# Patient Record
Sex: Female | Born: 1990
Health system: Southern US, Community
[De-identification: ages and names within clinical notes are randomized; demographics above are authoritative.]

## PROBLEM LIST (undated history)

## (undated) DIAGNOSIS — R519 Headache, unspecified: Secondary | ICD-10-CM

## (undated) DIAGNOSIS — R55 Syncope and collapse: Secondary | ICD-10-CM

## (undated) DIAGNOSIS — R51 Headache: Secondary | ICD-10-CM

## (undated) DIAGNOSIS — R002 Palpitations: Secondary | ICD-10-CM

## (undated) DIAGNOSIS — I498 Other specified cardiac arrhythmias: Secondary | ICD-10-CM

## (undated) DIAGNOSIS — I456 Pre-excitation syndrome: Secondary | ICD-10-CM

## (undated) DIAGNOSIS — R000 Tachycardia, unspecified: Secondary | ICD-10-CM

## (undated) DIAGNOSIS — G90A Postural orthostatic tachycardia syndrome (POTS): Secondary | ICD-10-CM

## (undated) DIAGNOSIS — I951 Orthostatic hypotension: Secondary | ICD-10-CM

---
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
## 2010-05-04 NOTE — Assessment & Plan Note (Signed)
Summary: eph/chest pains-mb   Visit Type:  Post-hospital  CC:  Chest pains.  History of Present Illness: Tis an 20 year old, African American female, patient, who presented to the emergency room July 28, 2009 with chest pain. The first day happened. She was getting dressed for school. She describes it as a sharp, shooting pain, followed by a heavy pressure. It will last a few minutes go away, but then come back. This is gone off-and-on for approximately 4 weeks now. It has improved slightly. He does not get worse with exercise. Nothing makes it better. She does have occasional dizziness with it. She plays basketball during the winter months, but denies any symptoms with exercise. She denies syncope, dyspnea, dyspnea on exertion. She denies palpitations.  The patient has been under quite a bit of stress with school and finishing her senior year at [employer].  Her EKG in the emergency room revealed Wolff-Parkinson-White syndrome with a short PR interval and delta wave and she was referred here.  Preventive Screening-Counseling & Management  Alcohol-Tobacco     Smoking Status: never  Caffeine-Diet-Exercise     Does Patient Exercise: yes      Drug Use:  no.    Current Medications (verified): 1)  Patient Does Not Take Any Meds  Allergies (verified): No Known Drug Allergies  Past History:  Past Medical History: Last updated: 08/19/2009 Current Problems:  CHEST PAIN (ICD-786.50) CONSTIPATION (ICD-564.00)  Family History: Family History of Hypertension:  FHx negative for CAD  Social History: Student  Tobacco Use - No.  Alcohol Use - no Regular Exercise - yes Drug Use - no Smoking Status:  never Does Patient Exercise:  yes Drug Use:  no  Review of Systems       see history of present illness. Otherwise, review of systems negative  Vital Signs:  Patient profile:   20 year old female Height:      63 inches Weight:      138.25 pounds BMI:     24.58 Pulse  rate:   72 / minute Pulse rhythm:   regular Resp:     18 per minute BP sitting:   104 / 80  (left arm) Cuff size:   regular  Vitals Entered By: Vikki Ports (Aug 20, 2009 10:14 AM)  Physical Exam  General:   Well-nournished, in no acute distress. Neck: No JVD, HJR, Bruit, or thyroid enlargement Lungs: No tachypnea, clear without wheezing, rales, or rhonchi Cardiovascular: RRR, PMI not displaced, heart sounds normal, no murmurs, gallops, bruit, thrill, or heave. Abdomen: BS normal. Soft without organomegaly, masses, lesions or tenderness. Extremities: without cyanosis, clubbing or edema. Good distal pulses bilateral SKin: Warm, no lesions or rashes  Musculoskeletal: No deformities Neuro: no focal signs    EKG  Procedure date:  08/20/2009  Findings:      normal sinus rhythm with short PR interval and small delta wave consistent with WPW  Impression & Recommendations:  Problem # 1:  WOLFF (WOLFE)-PARKINSON-WHITE (WPW) SYNDROME (ICD-426.7) EKG shows short PR interval and delta wave. This was reviewed with Dr. Sharrell Ku, who recommends 2-D echo and exercise treadmill testing. The patient denies palpitations, or syncope. She had occasional dizziness.  Problem # 2:  CHEST PAIN (ICD-786.50) chest pain sounds musculoskeletal in origin. Patient's father thinks it is stress related. We'll check an exercise treadmill. Orders: Echocardiogram (Echo) Treadmill (Treadmill)  Patient Instructions: 1)  Your physician recommends that you schedule a follow-up appointment in: 1 month with Dr. Ladona Ridgel. 2)  Your physician has requested that you have an exercise tolerance test.  For further information please visit [email or web address]  Please also follow instruction sheet, as given. 3)  Your physician has requested that you have an echocardiogram.  Echocardiography is a painless test that uses sound waves to create images of your heart. It provides your doctor with information about the size and  shape of your heart and how well your heart's chambers and valves are working.  This procedure takes approximately one hour. There are no restrictions for this procedure.

## 2010-06-21 LAB — COMPREHENSIVE METABOLIC PANEL
ALT: 11 U/L (ref 0–35)
Alkaline Phosphatase: 57 U/L (ref 39–117)
BUN: 9 mg/dL (ref 6–23)
Calcium: 9.5 mg/dL (ref 8.4–10.5)
Chloride: 105 mEq/L (ref 96–112)
GFR calc Af Amer: >60 mL/min (ref >60)
Glucose, Bld: 83 mg/dL (ref 70–99)
Total Bilirubin: 0.2 mg/dL — ABNORMAL LOW (ref 0.3–1.2)
Total Protein: 6.9 g/dL (ref 6.0–8.3)

## 2010-06-21 LAB — URINALYSIS, ROUTINE W REFLEX MICROSCOPIC
Bilirubin Urine: NEGATIVE
Ketones, ur: NEGATIVE mg/dL
Nitrite: NEGATIVE
Protein, ur: NEGATIVE mg/dL
Specific Gravity, Urine: 1.02 (ref 1.005–1.030)
pH: 7 (ref 5.0–8.0)

## 2010-06-21 LAB — DIFFERENTIAL
Basophils Absolute: 0 10*3/uL (ref 0.0–0.1)
Lymphs Abs: 4.2 10*3/uL — ABNORMAL HIGH (ref 0.7–4.0)

## 2010-06-21 LAB — CBC: HCT: 33.8 % — ABNORMAL LOW (ref 36.0–46.0)

## 2010-07-15 LAB — URINE MICROSCOPIC-ADD ON

## 2010-07-15 LAB — URINE CULTURE: Colony Count: 60000

## 2010-07-15 LAB — URINALYSIS, ROUTINE W REFLEX MICROSCOPIC
Bilirubin Urine: NEGATIVE
pH: 5.5 (ref 5.0–8.0)

## 2010-08-02 ENCOUNTER — Emergency Department (HOSPITAL_COMMUNITY)
Admission: EM | Admit: 2010-08-02 | Discharge: 2010-08-03 | Disposition: A | Discharge status: Home / Self Care | Payer: Medicaid Other | Attending: Emergency Medicine | Admitting: Emergency Medicine

## 2010-08-02 DIAGNOSIS — I456 Pre-excitation syndrome: Secondary | ICD-10-CM | POA: Insufficient documentation

## 2010-08-02 DIAGNOSIS — R109 Unspecified abdominal pain: Secondary | ICD-10-CM | POA: Insufficient documentation

## 2010-08-02 DIAGNOSIS — R110 Nausea: Secondary | ICD-10-CM | POA: Insufficient documentation

## 2010-08-02 LAB — POCT PREGNANCY, URINE: Preg Test, Ur: NEGATIVE

## 2010-08-03 LAB — URINALYSIS, ROUTINE W REFLEX MICROSCOPIC
Bilirubin Urine: NEGATIVE
Glucose, UA: NEGATIVE mg/dL
Nitrite: NEGATIVE
Specific Gravity, Urine: 1.021 (ref 1.005–1.030)

## 2010-08-03 LAB — COMPREHENSIVE METABOLIC PANEL
AST: 18 U/L (ref 0–37)
Alkaline Phosphatase: 46 U/L (ref 39–117)
CO2: 25 mEq/L (ref 19–32)
Calcium: 8.7 mg/dL (ref 8.4–10.5)
Chloride: 108 mEq/L (ref 96–112)
GFR calc Af Amer: >60 mL/min (ref >60)
GFR calc non Af Amer: >60 mL/min (ref >60)
Sodium: 136 mEq/L (ref 135–145)
Total Bilirubin: 0.3 mg/dL (ref 0.3–1.2)

## 2010-08-03 LAB — DIFFERENTIAL
Basophils Relative: 1 % (ref 0–1)
Lymphs Abs: 3.9 10*3/uL (ref 0.7–4.0)
Monocytes Absolute: 0.4 10*3/uL (ref 0.1–1.0)
Neutro Abs: 2.5 10*3/uL (ref 1.7–7.7)
Neutrophils Relative %: 36 % — ABNORMAL LOW (ref 43–77)

## 2010-08-03 LAB — LIPASE, BLOOD: Lipase: 28 U/L (ref 11–59)

## 2010-08-03 LAB — URINE MICROSCOPIC-ADD ON

## 2010-08-03 LAB — CBC
MCHC: 32.7 g/dL (ref 30.0–36.0)
Platelets: 236 10*3/uL (ref 150–400)
RDW: 12 % (ref 11.5–15.5)

## 2010-08-21 ENCOUNTER — Inpatient Hospital Stay (INDEPENDENT_AMBULATORY_CARE_PROVIDER_SITE_OTHER)
Admission: RE | Admit: 2010-08-21 | Discharge: 2010-08-21 | Disposition: A | Discharge status: Home / Self Care | Payer: Medicaid Other | Source: Ambulatory Visit | Attending: Family Medicine | Admitting: Family Medicine

## 2010-08-21 DIAGNOSIS — T7840XA Allergy, unspecified, initial encounter: Secondary | ICD-10-CM

## 2010-10-03 ENCOUNTER — Emergency Department (HOSPITAL_COMMUNITY)
Admission: EM | Admit: 2010-10-03 | Discharge: 2010-10-03 | Disposition: A | Discharge status: Home / Self Care | Payer: Medicaid Other | Attending: Emergency Medicine | Admitting: Emergency Medicine

## 2010-10-03 DIAGNOSIS — R0789 Other chest pain: Secondary | ICD-10-CM | POA: Insufficient documentation

## 2010-10-03 DIAGNOSIS — R110 Nausea: Secondary | ICD-10-CM | POA: Insufficient documentation

## 2010-10-03 DIAGNOSIS — R0609 Other forms of dyspnea: Secondary | ICD-10-CM | POA: Insufficient documentation

## 2010-10-03 DIAGNOSIS — I456 Pre-excitation syndrome: Secondary | ICD-10-CM | POA: Insufficient documentation

## 2010-10-03 DIAGNOSIS — R42 Dizziness and giddiness: Secondary | ICD-10-CM | POA: Insufficient documentation

## 2010-10-03 DIAGNOSIS — R002 Palpitations: Secondary | ICD-10-CM | POA: Insufficient documentation

## 2010-10-03 DIAGNOSIS — Z7982 Long term (current) use of aspirin: Secondary | ICD-10-CM | POA: Insufficient documentation

## 2010-10-03 DIAGNOSIS — R0989 Other specified symptoms and signs involving the circulatory and respiratory systems: Secondary | ICD-10-CM | POA: Insufficient documentation

## 2010-10-03 LAB — DIFFERENTIAL
Basophils Relative: 0 % (ref 0–1)
Eosinophils Absolute: 0.1 10*3/uL (ref 0.0–0.7)
Lymphocytes Relative: 46 % (ref 12–46)
Lymphs Abs: 3 10*3/uL (ref 0.7–4.0)
Monocytes Relative: 7 % (ref 3–12)
Neutro Abs: 3 10*3/uL (ref 1.7–7.7)
Neutrophils Relative %: 46 % (ref 43–77)

## 2010-10-03 LAB — CBC
Platelets: 255 10*3/uL (ref 150–400)
RBC: 3.69 MIL/uL — ABNORMAL LOW (ref 3.87–5.11)
WBC: 6.5 10*3/uL (ref 4.0–10.5)

## 2010-10-03 LAB — CK TOTAL AND CKMB (NOT AT ARMC)
CK, MB: 1.2 ng/mL (ref 0.3–4.0)
Total CK: 84 U/L (ref 7–177)

## 2010-10-03 LAB — BASIC METABOLIC PANEL
Calcium: 9.3 mg/dL (ref 8.4–10.5)
GFR calc non Af Amer: >60 mL/min (ref >60)
Potassium: 3.8 mEq/L (ref 3.5–5.1)
Sodium: 142 mEq/L (ref 135–145)

## 2010-10-03 LAB — TROPONIN I: Troponin I: <0.3 ng/mL (ref <0.30)

## 2010-10-08 ENCOUNTER — Emergency Department (HOSPITAL_COMMUNITY)
Admission: EM | Admit: 2010-10-08 | Discharge: 2010-10-09 | Disposition: A | Discharge status: Home / Self Care | Payer: Medicaid Other | Attending: Emergency Medicine | Admitting: Emergency Medicine

## 2010-10-08 DIAGNOSIS — R0789 Other chest pain: Secondary | ICD-10-CM | POA: Insufficient documentation

## 2010-10-08 DIAGNOSIS — Z7982 Long term (current) use of aspirin: Secondary | ICD-10-CM | POA: Insufficient documentation

## 2010-10-08 DIAGNOSIS — R002 Palpitations: Secondary | ICD-10-CM | POA: Insufficient documentation

## 2010-10-08 DIAGNOSIS — R42 Dizziness and giddiness: Secondary | ICD-10-CM | POA: Insufficient documentation

## 2010-10-09 ENCOUNTER — Emergency Department (HOSPITAL_COMMUNITY): Payer: Medicaid Other

## 2010-10-09 ENCOUNTER — Emergency Department (HOSPITAL_COMMUNITY)
Admission: EM | Admit: 2010-10-09 | Discharge: 2010-10-09 | Disposition: A | Discharge status: Home / Self Care | Payer: Medicaid Other | Attending: Emergency Medicine | Admitting: Emergency Medicine

## 2010-10-09 DIAGNOSIS — R55 Syncope and collapse: Secondary | ICD-10-CM | POA: Insufficient documentation

## 2010-10-09 DIAGNOSIS — R42 Dizziness and giddiness: Secondary | ICD-10-CM | POA: Insufficient documentation

## 2010-10-09 DIAGNOSIS — R079 Chest pain, unspecified: Secondary | ICD-10-CM | POA: Insufficient documentation

## 2010-10-09 DIAGNOSIS — R002 Palpitations: Secondary | ICD-10-CM | POA: Insufficient documentation

## 2010-10-09 LAB — CBC
HCT: 32.4 % — ABNORMAL LOW (ref 36.0–46.0)
Hemoglobin: 10.8 g/dL — ABNORMAL LOW (ref 12.0–15.0)
MCH: 28.9 pg (ref 26.0–34.0)
MCHC: 33.3 g/dL (ref 30.0–36.0)
MCV: 86.6 fL (ref 78.0–100.0)
Platelets: 204 K/uL (ref 150–400)
RBC: 3.74 MIL/uL — ABNORMAL LOW (ref 3.87–5.11)
RDW: 12 % (ref 11.5–15.5)
WBC: 7.2 K/uL (ref 4.0–10.5)

## 2010-10-09 LAB — URINALYSIS, ROUTINE W REFLEX MICROSCOPIC
Ketones, ur: 15 mg/dL — AB
Nitrite: NEGATIVE
Protein, ur: NEGATIVE mg/dL
pH: 5.5 (ref 5.0–8.0)

## 2010-10-09 LAB — BASIC METABOLIC PANEL
CO2: 26 mEq/L (ref 19–32)
Calcium: 9.2 mg/dL (ref 8.4–10.5)
Creatinine, Ser: 0.77 mg/dL (ref 0.50–1.10)
GFR calc non Af Amer: >60 mL/min (ref >60)

## 2010-10-09 LAB — CK TOTAL AND CKMB (NOT AT ARMC)
CK, MB: 1 ng/mL (ref 0.3–4.0)
Relative Index: INVALID (ref 0.0–2.5)
Total CK: 76 U/L (ref 7–177)

## 2010-10-09 LAB — DIFFERENTIAL
Basophils Absolute: 0 K/uL (ref 0.0–0.1)
Basophils Relative: 0 % (ref 0–1)
Eosinophils Absolute: 0.1 K/uL (ref 0.0–0.7)
Eosinophils Relative: 1 % (ref 0–5)
Lymphocytes Relative: 48 % — ABNORMAL HIGH (ref 12–46)
Lymphs Abs: 3.5 K/uL (ref 0.7–4.0)
Monocytes Absolute: 0.4 K/uL (ref 0.1–1.0)
Monocytes Relative: 6 % (ref 3–12)
Neutro Abs: 3.2 K/uL (ref 1.7–7.7)
Neutrophils Relative %: 45 % (ref 43–77)

## 2010-10-09 LAB — TROPONIN I: Troponin I: <0.3 ng/mL (ref <0.30)

## 2010-10-09 LAB — D-DIMER, QUANTITATIVE: D-Dimer, Quant: 0.33 ug{FEU}/mL (ref 0.00–0.48)

## 2010-10-09 LAB — POCT PREGNANCY, URINE: Preg Test, Ur: NEGATIVE

## 2010-10-10 ENCOUNTER — Inpatient Hospital Stay (HOSPITAL_COMMUNITY)
Admission: EM | Admit: 2010-10-10 | Discharge: 2010-10-11 | DRG: 312 | Disposition: A | Discharge status: Home / Self Care | Payer: Medicaid Other | Source: Other Acute Inpatient Hospital | Attending: Cardiology | Admitting: Cardiology

## 2010-10-10 ENCOUNTER — Emergency Department (HOSPITAL_COMMUNITY)
Admission: EM | Admit: 2010-10-10 | Discharge: 2010-10-10 | Disposition: A | Discharge status: Home / Self Care | Payer: Medicaid Other | Attending: Emergency Medicine | Admitting: Emergency Medicine

## 2010-10-10 ENCOUNTER — Emergency Department (HOSPITAL_COMMUNITY)
Admission: EM | Admit: 2010-10-10 | Discharge: 2010-10-10 | Disposition: A | Discharge status: Other Acute Inpatient Hospital | Payer: Medicaid Other | Source: Home / Self Care | Attending: Emergency Medicine | Admitting: Emergency Medicine

## 2010-10-10 DIAGNOSIS — R55 Syncope and collapse: Secondary | ICD-10-CM | POA: Insufficient documentation

## 2010-10-10 DIAGNOSIS — I456 Pre-excitation syndrome: Secondary | ICD-10-CM | POA: Insufficient documentation

## 2010-10-10 DIAGNOSIS — R002 Palpitations: Secondary | ICD-10-CM

## 2010-10-10 LAB — RAPID URINE DRUG SCREEN, HOSP PERFORMED
Amphetamines: NOT DETECTED
Barbiturates: NOT DETECTED
Benzodiazepines: NOT DETECTED

## 2010-10-10 LAB — CARDIAC PANEL(CRET KIN+CKTOT+MB+TROPI)
Relative Index: INVALID (ref 0.0–2.5)
Total CK: 71 U/L (ref 7–177)

## 2010-10-11 DIAGNOSIS — R55 Syncope and collapse: Secondary | ICD-10-CM

## 2010-10-11 LAB — IRON AND TIBC: UIBC: 330 ug/dL

## 2010-10-11 LAB — CARDIAC PANEL(CRET KIN+CKTOT+MB+TROPI)
CK, MB: 0.9 ng/mL (ref 0.3–4.0)
Relative Index: INVALID (ref 0.0–2.5)
Total CK: 67 U/L (ref 7–177)
Total CK: 66 U/L (ref 7–177)

## 2010-10-11 LAB — VITAMIN B12: Vitamin B-12: 710 pg/mL (ref 211–911)

## 2010-10-12 ENCOUNTER — Encounter (INDEPENDENT_AMBULATORY_CARE_PROVIDER_SITE_OTHER): Payer: Medicaid Other

## 2010-10-12 DIAGNOSIS — R55 Syncope and collapse: Secondary | ICD-10-CM

## 2010-10-15 NOTE — H&P (Signed)
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  She had been going to collage in [place], but has now come back to [place] and will be going to school in [place] in the fall.  In the past, there was a diagnosis of WPW.  Her entire workup however was done in [place] and she underwent ablation in October 2011.  We have no records.  Recently, she has been bothered by palpitations and syncope.  She has been in the Emergency Room in [place] on several occasions.  The syncope is difficult to describe.  With today's episode, the patient's mother saw her sitting and she appeared to "slump over," but another observer thought that she continued to hold on to a bottle of water. She was seemingly poorly responsive at that time.  The patient does not remember the episode until she begins to wake up.  There is no obvious seizure activity.  The patient is insistent that she feels palpitations before this.  It is my understanding that with her trips to the emergency room recently, arrhythmia has not been documented.  PAST MEDICAL HISTORY:  ALLERGIES:  No known drug allergies.  MEDICATIONS:  No medications.  OTHER MEDICAL PROBLEMS:  See the complete list below.  SOCIAL HISTORY:  The patient does not smoke.  She does not use any illicit drugs by history.  FAMILY HISTORY:  There is no strong family history of coronary disease.  REVIEW OF SYSTEMS:  The patient denies fever, chills, headache, sweats, rash, change in vision, change in hearing, chest pain, cough, nausea, vomiting, or urinary symptoms.  All other systems are reviewed and  are negative.  PHYSICAL EXAMINATION:  VITAL SIGNS:  In the emergency room, the patient's blood pressure has been in the range of 102-120s systolic with a diastolic in the 75 range.  Pulse rate has been 100. GENERAL:  The patient has been oriented to person, time, and place. Affect is normal.  I spoke with her, with her mother present. HEENT:  Head is atraumatic.  There is no xanthelasma.  There is no jugular venous distention.  There are no carotid bruits. LUNGS:  Clear.  Respiratory effort is not labored. CARDIAC:  S1, S2.  There are no clicks or significant murmurs. ABDOMEN:  Soft. EXTREMITIES:  There is no peripheral edema. MUSCULOSKELETAL:  There are no musculoskeletal deformities. SKIN:  There are no skin rashes.  LABORATORY DATA:  Was done earlier today.  TSH is pending.  Hemoglobin is 10.8.  BUN is 12 with a creatinine of 3.7.  Sodium is 140.  EKG reveals normal QRS with a short PR interval.  PROBLEMS: 1. History of Wolff-Parkinson-White.  There is history of an ablation     done in [place] in October 2011. 2. Recurrent palpitations and episodes of syncope by history.  The  exact etiology of her current symptoms is not at all clear.  I am     not sure if there is true syncope.  I am not sure if there are true     tachy arrhythmias.  The patient has had recurrent visits to the ER     and was in the emergency room on two occasions today.  She is now     admitted to be monitored.  We will have electrophysiology see her.     We will have to consider whether she needs Neurology workup also.     We will need to obtain records from her [facility] evaluation.     Luis Abed, MD, [facility]     JDK/MEDQ  D:  10/10/2010  T:  10/10/2010  Job:  [id number]  Electronically Signed by Willa Rough MD FACC on 10/15/2010 04:57:35 PM

## 2010-10-21 NOTE — Discharge Summary (Signed)
Desiree Gutierrez, Desiree Gutierrez              ACCOUNT NO.:  [id number]  MEDICAL RECORD NO.:  [id number]  LOCATION:  2005                         FACILITY:  [facility]  PHYSICIAN:  Duke Salvia, MD, FACCDATE OF BIRTH:  1990-08-27  DATE OF ADMISSION:  10/10/2010 DATE OF DISCHARGE:  10/11/2010                              DISCHARGE SUMMARY   DISCHARGE DIAGNOSIS:  Syncope.  SECONDARY DIAGNOSES: 1. History of Wolff-Parkinson-White, status post ablation in [place]     in October 2011. 2. Recurrent palpitations.  ALLERGIES:  No known drug allergies.  PROCEDURES:  An 2-D echocardiogram October 11, 2010, showing normal LV function.  EF 55-60%.  Trivial mitral regurgitation.  HISTORY OF PRESENT ILLNESS:  A 20 year old female with prior history of Wolff-Parkinson-White syndrome, status post ablation in [place] in October 2011.  More recently, she has noted increased occasional palpitations.  On the day of admission, the patient was at church and was noted by her mother to "slump over."  There was some question as whether she continued to hold on to a bottle of water that she was holding onto.  The patient did have episode and was poorly responsive for some period of time.  When she came, she did apparently report feeling palpitations prior to the event.  She was taken to the [facility] ED, where there was no evidence of arrhythmia and then transferred to [facility] for further evaluation.  HOSPITAL COURSE:  The patient has had no arrhythmias overnight.  She has had no recurrence of syncope or palpitations.  An 2-D echocardiogram was normal.  She has been evaluated by Dr. Graciela Husbands with [facility], who feels that recurrent syncope with palpitations almost certainly mediated be normal reflexes.  As said, one was to be concerned about whether retrograde circuit of WPW has recovered.  After discussion with the patient and family regarding diagnostic options including invasive EP study to  exclude retrograde circuit versus event recorder to exclude recurrent arrhythmia, decision ultimately was made to place an event recorder which we have arranged through our office. Fluid and sodium repletion have been recommended.  The patient will be discharged home this evening in good condition.  DISCHARGE LABORATORY DATA:  Hemoglobin 10.8, hematocrit 32.4, WBC 7.2, platelets 204, D-dimer 0.33.  Sodium 140, potassium 3.7, chloride 105, CO2 26, BUN 12, creatinine 0.77, glucose 87, CK 66, MB 0.9, troponin-I less than 0.30.  TSH 1.385, free T4 1.05.  Serum iron 47, TIBC 377, vitamin B12 710, folate 15.6, ferritin 13.  Urine drug screen was negative.  Urinalysis negative.  DISPOSITION:  The patient will be discharged home today in good condition.  FOLLOWUP PLANS AND APPOINTMENTS:  We will arrange for followup event recorder through our office and subsequent followup with Dr. Graciela Husbands.  DISCHARGE MEDICATIONS:  None.  OUTSTANDING LABORATORY DATA AND STUDIES:  Event recorder pending.  DURATION OF DISCHARGE ENCOUNTER:  45 minutes including physician time.     Desiree Gutierrez, ANP   ______________________________ Duke Salvia, MD, [facility]    CB/MEDQ  D:  10/11/2010  T:  10/12/2010  Job:  [id number]  Electronically Signed by Desiree Gutierrez ANP on 10/19/2010 04:47:21 PM Electronically Signed by Desiree Manges MD [facility]  on 10/21/2010 02:13:39 PM

## 2010-10-21 NOTE — Consult Note (Signed)
[name], Gutierrez NO.:  [id number]  MEDICAL RECORD NO.:  [id number]  LOCATION:  2005                         FACILITY:  [facility]  PHYSICIAN:  Duke Salvia, MD, FACCDATE OF BIRTH:  Jul 25, 1990  DATE OF CONSULTATION:  10/11/2010 DATE OF DISCHARGE:  10/11/2010                                CONSULTATION   Thank you very much for asking [place] see to [facility] in consultation because of recurrent syncope.  The patient is a 20 year old girl who underwent catheter ablation at [facility] in [place] in the fall of 2011 because of recurrent syncope associated with palpitations and ventricular pre-excitation.  Those records are not available.  In January, she was seen again at [facility] because of recurrent similar symptoms.  She did not undergo electrophysiological testing or tilt table testing.  She did undergo exercise testing, which was apparently unrevealing.  The next few months were then quite symptom free.  About a week or two ago, she began having recurrent typical symptoms.  These are characterized by a prodrome of unclear duration. On the one hand she says that they would last 5 minutes; on the other hand, they would happen so fast, she could not tell anybody.  On repeated questioning, I could not get a sense as to which of these two really was.  In any case, the prodromal was characterized by palpitations, nausea, flushing, diaphoresis.  The recovery phase was characterized by similar symptoms and profound fatigue.  These episodes have occurred while seated.  They have occurred in the church.  She has had some degree of heat intolerance.  Her diet is rather replete of salt and rather replete of fluid.  PAST MEDICAL HISTORY:  In addition to the above is notable for some anemia.  PAST SURGICAL HISTORY:  Negative apart from the above.  MEDICATIONS:  None.  ALLERGIES:  No known drug allergies.  SOCIAL HISTORY:  She is living at home.  She does not smoke or  use recreational drugs.  FAMILY HISTORY:  Negative for coronary artery disease.  REVIEW OF SYSTEM:  Broadly negative apart from a little bit of stress associated with being at home.  PHYSICAL EXAMINATION:  GENERAL:  She is a young, [place] American female, appearing her stated age of 20. VITAL SIGNS:  Her blood pressure is 98/63, her pulse is 77. HEENT:  Normal. NECK:  Her neck veins were flat.  Her carotids are brisk and full bilaterally without bruits. BACK:  Without kyphosis or scoliosis. LUNGS:  Clear. HEART:  Sounds were regular without murmurs or gallops. ABDOMEN:  Soft with active bowel sounds without midline pulsation or hepatomegaly. EXTREMITIES:  Femoral pulses were 2+.  Distal pulses were intact with no clubbing, cyanosis, or edema. NEUROLOGICAL:  Grossly normal. SKIN:  Warm and dry. PSYCH:  Her affect was little bit flat and nonengaged.  Her blood pressure climb with standing.  Her heart rate also climb with standing from 77 to 92.  Prolonged standing was not recorded.  Laboratories notable for hemoglobin of 10.8.  Electrocardiogram demonstrated no ventricular pre-excitation.  We have just received the records from [facility].  The patient's records, she was given Zio patch.  She had  two spells that were typical.  On the symptomatic episodes, sinus rhythm was identified.  Three beats of nonsustained VT were also noted.  IMPRESSION:  Recurrent syncope with palpitations, almost certainly neurally mediated.  Based on the Zio patch results which are updated since I wrote my note in the chart, it is unlikely that there is an arrhythmic trigger.  At this point, my inclination would be to push salt and water repletion. Tilt table testing would only serve to validate the presence of the reflex as opposed to help [place] identify potential therapy to prevent triggering.  Beta-blockers may be helpful.  Mostly the tachy palpitations that are appreciated or a consequence of the  adrenergic stimulation that precedes adrenergic withdrawal.  The beta-blockers might be of some value.  I will plan to discuss this with the family.  We can anticipate early discharge.     Duke Salvia, MD, [facility]     SCK/MEDQ  D:  10/11/2010  T:  10/12/2010  Job:  [id number]  Electronically Signed by Sherryl Manges MD [facility] on 10/21/2010 02:13:37 PM

## 2010-12-03 ENCOUNTER — Encounter: Payer: Self-pay | Admitting: Internal Medicine

## 2010-12-03 ENCOUNTER — Ambulatory Visit (INDEPENDENT_AMBULATORY_CARE_PROVIDER_SITE_OTHER): Payer: Medicaid Other | Admitting: Internal Medicine

## 2010-12-03 VITALS — BP 106/73 | HR 81 | Ht 63.0 in | Wt 132.8 lb

## 2010-12-03 DIAGNOSIS — I456 Pre-excitation syndrome: Secondary | ICD-10-CM

## 2010-12-03 DIAGNOSIS — R55 Syncope and collapse: Secondary | ICD-10-CM | POA: Insufficient documentation

## 2010-12-03 NOTE — Assessment & Plan Note (Signed)
No evidence of recurrent conduction

## 2010-12-03 NOTE — Assessment & Plan Note (Signed)
Again reviewed the physiology of neurally mediated syncope. She'll continue her salt and water. We discussed the use of Florinef or Proamiatine. She would like to refrain from medications at this point it will see her she does later in the fall.

## 2010-12-03 NOTE — Patient Instructions (Signed)
Your physician recommends that you schedule a follow-up appointment in: 3 MONTHS WITH DR. Graciela Husbands AS PER DR. Graciela Husbands

## 2010-12-03 NOTE — Progress Notes (Signed)
might be of some value.  HPI  Desiree Gutierrez is a 20 y.o. female Seen because of palpitations for which she was hospitalized. She has a history of WPW for which he underwent catheter ablation in [place]. She then had.Recurrent syncope with palpitations, almost certainly  neurally mediated. Based on the Zio patch results which are updated  since I wrote my note in the chart, it is unlikely that there is an arrhythmic trigger.    We have encouraged her to increase her salt and water intake; this has helped a little bit maybe. She is noted that Ambient.heat is clearly more problematic;  Event recorder failed to identify any significant arrhythmia  No past medical history on file.  No past surgical history on file.  No current outpatient prescriptions on file.    Not on File  Review of Systems negative except from HPI and PMH  Physical Exam Well developed and well nourished in no acute distress HENT normal E scleral and icterus clear Neck Supple JVP flat; carotids brisk and full Clear to ausculation Regular rate and rhythm, no murmurs gallops or rub Soft with active bowel sounds No clubbing cyanosis and edema Alert and oriented, grossly normal motor and sensory function Skin Warm and Dry  ECG sinus rhythm 81 next item hold 0.03/2007/0.36 Axis is 75 XI no evidence of ventricular preexcitation Assessment and  Plan

## 2010-12-14 ENCOUNTER — Telehealth: Payer: Self-pay | Admitting: Internal Medicine

## 2010-12-14 NOTE — Telephone Encounter (Signed)
Pt's godfather calling re pt requesting an appt for chills, body warm, and dizziness, asked if she had chest pain or sob and she stated yes for the past two days

## 2010-12-14 NOTE — Telephone Encounter (Signed)
Pt called complaining of Chest pain, dizziness, sob, nausea, elevated temp (hasn't checked it but has chills), slight abdominal pain and headache.  Symptoms started yesterday

## 2010-12-14 NOTE — Telephone Encounter (Signed)
Pt insisted on seeing cardiologist for these symptoms.  I checked with Dr Eden Emms who recommended pt see Urgent Care or her PCP.  Pt was informed of Dr Fabio Bering recommendations.

## 2010-12-14 NOTE — Telephone Encounter (Signed)
Advised pt to go to urgent care.

## 2011-02-08 ENCOUNTER — Other Ambulatory Visit: Payer: Self-pay

## 2011-02-08 ENCOUNTER — Emergency Department (HOSPITAL_COMMUNITY)
Admission: EM | Admit: 2011-02-08 | Discharge: 2011-02-09 | Disposition: A | Discharge status: Home / Self Care | Payer: Medicaid Other | Attending: Emergency Medicine | Admitting: Emergency Medicine

## 2011-02-08 ENCOUNTER — Encounter (HOSPITAL_COMMUNITY): Payer: Self-pay | Admitting: *Deleted

## 2011-02-08 DIAGNOSIS — R0789 Other chest pain: Secondary | ICD-10-CM | POA: Insufficient documentation

## 2011-02-08 DIAGNOSIS — I2510 Atherosclerotic heart disease of native coronary artery without angina pectoris: Secondary | ICD-10-CM | POA: Insufficient documentation

## 2011-02-08 DIAGNOSIS — R42 Dizziness and giddiness: Secondary | ICD-10-CM | POA: Insufficient documentation

## 2011-02-08 DIAGNOSIS — R799 Abnormal finding of blood chemistry, unspecified: Secondary | ICD-10-CM | POA: Insufficient documentation

## 2011-02-08 DIAGNOSIS — R55 Syncope and collapse: Secondary | ICD-10-CM | POA: Insufficient documentation

## 2011-02-08 DIAGNOSIS — R209 Unspecified disturbances of skin sensation: Secondary | ICD-10-CM | POA: Insufficient documentation

## 2011-02-08 DIAGNOSIS — R002 Palpitations: Secondary | ICD-10-CM | POA: Insufficient documentation

## 2011-02-08 DIAGNOSIS — I456 Pre-excitation syndrome: Secondary | ICD-10-CM | POA: Insufficient documentation

## 2011-02-08 HISTORY — DX: Pre-excitation syndrome: I45.6

## 2011-02-08 NOTE — ED Notes (Signed)
She has wpw and today she passed out while going across campus at school.  She sees a doctor at [facility] for the  Same.  Chest pain at present

## 2011-02-09 ENCOUNTER — Emergency Department (HOSPITAL_COMMUNITY): Payer: Medicaid Other

## 2011-02-09 LAB — COMPREHENSIVE METABOLIC PANEL
Albumin: 4.3 g/dL (ref 3.5–5.2)
BUN: 11 mg/dL (ref 6–23)
Chloride: 103 mEq/L (ref 96–112)
Creatinine, Ser: 0.79 mg/dL (ref 0.50–1.10)
GFR calc non Af Amer: >90 mL/min (ref >90)
Total Bilirubin: 0.2 mg/dL — ABNORMAL LOW (ref 0.3–1.2)

## 2011-02-09 LAB — DIFFERENTIAL
Basophils Relative: 0 % (ref 0–1)
Eosinophils Absolute: 0.1 10*3/uL (ref 0.0–0.7)
Eosinophils Relative: 1 % (ref 0–5)
Monocytes Absolute: 0.5 10*3/uL (ref 0.1–1.0)
Monocytes Relative: 6 % (ref 3–12)
Neutro Abs: 3.7 10*3/uL (ref 1.7–7.7)

## 2011-02-09 LAB — CBC
HCT: 33.9 % — ABNORMAL LOW (ref 36.0–46.0)
Hemoglobin: 11 g/dL — ABNORMAL LOW (ref 12.0–15.0)
MCH: 27.7 pg (ref 26.0–34.0)
MCHC: 32.4 g/dL (ref 30.0–36.0)
MCV: 85.4 fL (ref 78.0–100.0)

## 2011-02-09 LAB — TSH: TSH: 4.376 u[IU]/mL (ref 0.350–4.500)

## 2011-02-09 LAB — POCT I-STAT TROPONIN I: Troponin i, poc: 0 ng/mL (ref 0.00–0.08)

## 2011-02-09 MED ORDER — ONDANSETRON 4 MG PO TBDP
8.0000 mg | ORAL_TABLET | Freq: Once | ORAL | Status: AC
  Administered 2011-02-09: 8 mg via ORAL
  Filled 2011-02-09: qty 2

## 2011-02-09 MED ORDER — IOHEXOL 300 MG/ML  SOLN
80.0000 mL | Freq: Once | INTRAMUSCULAR | Status: AC | PRN
  Administered 2011-02-09: 80 mL via INTRAVENOUS

## 2011-02-09 NOTE — ED Provider Notes (Signed)
History     CSN: [id number] Arrival date & time: 02/08/2011  7:40 PM   First MD Initiated Contact with Patient 02/09/11 0051      Chief Complaint  Patient presents with  . Near Syncope    (Consider location/radiation/quality/duration/timing/severity/associated sxs/prior treatment) HPI Comments: History of WPW. Followed by cardiology both here as well as [facility]. Had an ablation this year. Presents after a syncopal episode. Had some preceding chest pain and left-sided numbness and pain prior to her syncopal event. She has no headache, chest pain, shortness of breath at this time. Has a history of multiple similar presentations which required admission. Cardiologist is Dr. Graciela Husbands.  Patient is a 20 y.o. female presenting with syncope. The history is provided by the patient.  Loss of Consciousness This is a new problem. The current episode started 6 to 12 hours ago. The problem occurs rarely. The problem has been resolved. Associated symptoms include chest pain. Pertinent negatives include no abdominal pain, no headaches and no shortness of breath. The symptoms are aggravated by walking. The symptoms are relieved by nothing. She has tried nothing for the symptoms. The treatment provided no relief.    Past Medical History  Diagnosis Date  . Coronary artery disease   . WPW (Wolff-Parkinson-White syndrome)     History reviewed. No pertinent past surgical history.  No family history on file.  History  Substance Use Topics  . Smoking status: Never Smoker   . Smokeless tobacco: Not on file  . Alcohol Use: No    OB History    Grav Para Term Preterm Abortions TAB SAB Ect Mult Living                  Review of Systems  Constitutional: Negative for fever, diaphoresis, activity change, appetite change and fatigue.  HENT: Negative for congestion, sore throat, rhinorrhea, neck pain and neck stiffness.   Respiratory: Positive for chest tightness. Negative for cough, shortness of  breath and wheezing.   Cardiovascular: Positive for chest pain, palpitations and syncope.  Gastrointestinal: Negative for nausea, vomiting and abdominal pain.  Genitourinary: Negative for dysuria, urgency, frequency and flank pain.  Musculoskeletal: Negative for myalgias, back pain and arthralgias.  Skin: Negative for rash and wound.  Neurological: Positive for syncope and light-headedness. Negative for dizziness, weakness, numbness and headaches.  All other systems reviewed and are negative.    Allergies  Review of patient's allergies indicates no known allergies.  Home Medications   Current Outpatient Rx  Name Route Sig Dispense Refill  . ASPIRIN 81 MG PO TABS Oral Take 81 mg by mouth daily.       BP 118/78  Pulse 83  Temp(Src) 99.1 F (37.3 C) (Oral)  Resp 21  Ht 5\' 3"  (1.6 m)  Wt 142 lb (64.411 kg)  BMI 25.15 kg/m2  SpO2 100%  LMP 02/08/2011  Physical Exam  Nursing note and vitals reviewed. Constitutional: She is oriented to person, place, and time. She appears well-developed and well-nourished. No distress.  HENT:  Head: Normocephalic and atraumatic.  Mouth/Throat: Oropharynx is clear and moist.  Eyes: Conjunctivae and EOM are normal. Pupils are equal, round, and reactive to light.  Neck: Normal range of motion. Neck supple.  Cardiovascular: Normal rate, regular rhythm and intact distal pulses.  Exam reveals no gallop and no friction rub.   No murmur heard. Pulmonary/Chest: Effort normal and breath sounds normal. No respiratory distress. She exhibits no tenderness.  Abdominal: Soft. Bowel sounds are normal. There is  no tenderness.  Musculoskeletal: She exhibits no tenderness.  Neurological: She is alert and oriented to person, place, and time. No cranial nerve deficit.  Skin: Skin is warm and dry. No rash noted.    ED Course  Procedures (including critical care time)   Date: 02/09/2011  Rate: 79  Rhythm: normal sinus rhythm and sinus arrhythmia  QRS Axis:  normal  Intervals: PR shortened  ST/T Wave abnormalities: normal  Conduction Disutrbances:none  Narrative Interpretation:   Old EKG Reviewed: unchanged  Labs Reviewed  CBC - Abnormal; Notable for the following:    Hemoglobin 11.0 (*)    HCT 33.9 (*)    All other components within normal limits  DIFFERENTIAL - Abnormal; Notable for the following:    Lymphocytes Relative 50 (*)    Lymphs Abs 4.3 (*)    All other components within normal limits  COMPREHENSIVE METABOLIC PANEL - Abnormal; Notable for the following:    Total Bilirubin 0.2 (*)    All other components within normal limits  D-DIMER, QUANTITATIVE - Abnormal; Notable for the following:    D-Dimer, Quant 0.61 (*)    All other components within normal limits  POCT I-STAT TROPONIN I  TSH  I-STAT TROPONIN I   Dg Chest 2 View  02/09/2011  *RADIOLOGY REPORT*  Clinical Data: Syncope, chest pressure.  CHEST - 2 VIEW  Comparison: 10/09/2010  Findings: Lungs are clear. No pleural effusion or pneumothorax. The cardiomediastinal contours are within normal limits. The visualized bones and soft tissues are without significant appreciable abnormality.  IMPRESSION: No acute cardiopulmonary process.  Original Report Authenticated By: Waneta Martins, M.D.   Ct Angio Chest W/cm &/or Wo Cm  02/09/2011  *RADIOLOGY REPORT*  Clinical Data:  Syncope, elevated D-dimer, chest pain.  CT ANGIOGRAPHY CHEST WITH CONTRAST  Technique:  Multidetector CT imaging of the chest was performed using the standard protocol during bolus administration of intravenous contrast.  Multiplanar CT image reconstructions including MIPs were obtained to evaluate the vascular anatomy.  Contrast: 80mL OMNIPAQUE IOHEXOL 300 MG/ML IV SOLN  Comparison:  02/09/2011 radiograph  Findings:  There is no pulmonary arterial branch filling defect. Aorta is of normal course and caliber.  Normal heart size.  No pleural or pericardial effusion.  No intrathoracic lymphadenopathy.  Limited  images through the upper abdomen show no acute abnormality.  Central airways are patent.  Lungs are clear.  No pneumothorax.  No acute osseous abnormality.  Review of the MIP images confirms the above findings.  IMPRESSION: No pulmonary embolism or acute intrathoracic process.  Original Report Authenticated By: Waneta Martins, M.D.     1. Syncope, vasovagal       MDM  The syncope evaluation was performed on this patient. She has a history of syncope related to WPW as well as vasovagal syncope. EKG was normal. Her workup was unremarkable except for an elevated d-dimer. CT angioma chest was negative for pulmonary list. Discussed the case with Dr Teressa Lower states the patient is stable for discharge to home. She took her phone number so he can give them a call in the morning to schedule an appointment with her primary cardiologist. Since the patient is stable for discharge per cardiology standpoint I will discharge her home. She is instructed to followup as directed by cardiologist. She did not exhibit any signs of arrhythmia while in the emergency department         Dayton Bailiff, MD 02/09/11 [phone]

## 2011-02-09 NOTE — ED Notes (Signed)
Pt presents w/ c/o near syncope today - pt w/ hx of WPW syndrome. Pt is a&ox4 Pt resting comfortably on bed in no acute distress, family at bedside.  Pt denies any current pain, dizziness, or shortness of breath.

## 2011-02-09 NOTE — ED Notes (Signed)
No rx given. D/c instructions reviewed w/ pt and family - pt and family deny any further questions or concerns at present.

## 2011-02-22 ENCOUNTER — Ambulatory Visit: Payer: Medicaid Other | Admitting: Internal Medicine

## 2011-03-07 ENCOUNTER — Emergency Department (HOSPITAL_COMMUNITY)
Admission: EM | Admit: 2011-03-07 | Discharge: 2011-03-08 | Discharge status: Against Medical Advice | Payer: Medicaid Other | Attending: Emergency Medicine | Admitting: Emergency Medicine

## 2011-03-07 ENCOUNTER — Encounter (HOSPITAL_COMMUNITY): Payer: Self-pay | Admitting: Emergency Medicine

## 2011-03-07 DIAGNOSIS — Z0389 Encounter for observation for other suspected diseases and conditions ruled out: Secondary | ICD-10-CM | POA: Insufficient documentation

## 2011-03-07 NOTE — ED Notes (Signed)
PT. REPORTS EPISTAXIS THIS EVENING , NO BLEEDING AT TRIAGE , STATES ESOPHAGEAL STUDY DONE LAST Friday - ENDOSCOPE INSERTED AT [facility].

## 2011-03-26 ENCOUNTER — Emergency Department (HOSPITAL_COMMUNITY)
Admission: EM | Admit: 2011-03-26 | Discharge: 2011-03-27 | Disposition: A | Discharge status: Home / Self Care | Payer: Medicaid Other | Attending: Emergency Medicine | Admitting: Emergency Medicine

## 2011-03-26 ENCOUNTER — Encounter (HOSPITAL_COMMUNITY): Payer: Self-pay | Admitting: *Deleted

## 2011-03-26 DIAGNOSIS — R51 Headache: Secondary | ICD-10-CM | POA: Insufficient documentation

## 2011-03-26 DIAGNOSIS — R110 Nausea: Secondary | ICD-10-CM | POA: Insufficient documentation

## 2011-03-26 DIAGNOSIS — S060X9A Concussion with loss of consciousness of unspecified duration, initial encounter: Secondary | ICD-10-CM | POA: Insufficient documentation

## 2011-03-26 DIAGNOSIS — S060XAA Concussion with loss of consciousness status unknown, initial encounter: Secondary | ICD-10-CM | POA: Insufficient documentation

## 2011-03-26 DIAGNOSIS — W08XXXA Fall from other furniture, initial encounter: Secondary | ICD-10-CM | POA: Insufficient documentation

## 2011-03-26 NOTE — ED Notes (Signed)
Patient fell yesterday am around 5am, rolled off the couch and onto the floor ( ? Hitting her head).  C/o headpain to her right side.  Patient also c/o having complications with her medications atenolol.

## 2011-03-27 NOTE — ED Provider Notes (Signed)
History     CSN: [id number]  Arrival date & time 03/26/11  2331   First MD Initiated Contact with Patient 03/27/11 0146      Chief Complaint  Patient presents with  . Fall    Patient is a 20 y.o. female presenting with fall. The history is provided by the patient.  Fall The accident occurred 12 to 24 hours ago. Incident: rolled off of couch. The point of impact was the head. The pain is present in the head. The pain is mild. She was ambulatory at the scene. Associated symptoms include nausea and headaches. Pertinent negatives include no visual change. The symptoms are aggravated by activity.  pt reports she rolled off of couch and hit her head, and was found in floor Since then she has had headache and mild nausea  Also - reports she was started on b-blocker for her WPW, and reports headaches and fatigue No new CP/SOB reported   Past Medical History  Diagnosis Date  . Coronary artery disease   . WPW (Wolff-Parkinson-White syndrome)     History reviewed. No pertinent past surgical history.  No family history on file.  History  Substance Use Topics  . Smoking status: Never Smoker   . Smokeless tobacco: Not on file  . Alcohol Use: No    OB History    Grav Para Term Preterm Abortions TAB SAB Ect Mult Living                  Review of Systems  Gastrointestinal: Positive for nausea.  Neurological: Positive for headaches.  All other systems reviewed and are negative.    Allergies  Review of patient's allergies indicates no known allergies.  Home Medications   Current Outpatient Rx  Name Route Sig Dispense Refill  . ASPIRIN 81 MG PO TABS Oral Take 81 mg by mouth daily.     . ATENOLOL 25 MG PO TABS Oral Take 25 mg by mouth daily.        BP 109/77  Pulse 80  Temp(Src) 97.9 F (36.6 C) (Oral)  Resp 16  SpO2 98%  Physical Exam  CONSTITUTIONAL: Well developed/well nourished HEAD AND FACE: Normocephalic/atraumatic EYES: EOMI/PERRL ENMT: Mucous membranes  moist NECK: supple no meningeal signs SPINE:entire spine nontender CV: S1/S2 noted, no murmurs/rubs/gallops noted LUNGS: Lungs are clear to auscultation bilaterally, no apparent distress ABDOMEN: soft, nontender, no rebound or guarding GU:no cva tenderness NEURO: Pt is awake/alert, moves all extremitiesx4 No focal motor deficits noted in the upper or lower extremities GCS 15 Gait normal EXTREMITIES: pulses normal, full ROM SKIN: warm, color normal PSYCH: no abnormalities of mood noted   ED Course  Procedures    1. Concussion       MDM  Nursing notes reviewed and considered in documentation Previous records reviewed and considered   Pt well appearing Doubt significant head injury No neuro deficits, well appearing, nontoxic I advised her to f/u with her cardiologist for questions on her metoprolol        Joya Gaskins, MD 03/27/11 [phone]

## 2011-05-02 ENCOUNTER — Emergency Department (HOSPITAL_COMMUNITY): Payer: Medicaid Other

## 2011-05-02 ENCOUNTER — Emergency Department (HOSPITAL_COMMUNITY)
Admission: EM | Admit: 2011-05-02 | Discharge: 2011-05-03 | Disposition: A | Discharge status: Home / Self Care | Payer: Medicaid Other | Attending: Emergency Medicine | Admitting: Emergency Medicine

## 2011-05-02 ENCOUNTER — Other Ambulatory Visit: Payer: Self-pay

## 2011-05-02 DIAGNOSIS — R5381 Other malaise: Secondary | ICD-10-CM | POA: Insufficient documentation

## 2011-05-02 DIAGNOSIS — R55 Syncope and collapse: Secondary | ICD-10-CM | POA: Insufficient documentation

## 2011-05-02 DIAGNOSIS — I456 Pre-excitation syndrome: Secondary | ICD-10-CM | POA: Insufficient documentation

## 2011-05-02 DIAGNOSIS — W07XXXA Fall from chair, initial encounter: Secondary | ICD-10-CM | POA: Insufficient documentation

## 2011-05-02 DIAGNOSIS — R51 Headache: Secondary | ICD-10-CM | POA: Insufficient documentation

## 2011-05-02 DIAGNOSIS — R404 Transient alteration of awareness: Secondary | ICD-10-CM | POA: Insufficient documentation

## 2011-05-02 DIAGNOSIS — Z79899 Other long term (current) drug therapy: Secondary | ICD-10-CM | POA: Insufficient documentation

## 2011-05-02 DIAGNOSIS — S0990XA Unspecified injury of head, initial encounter: Secondary | ICD-10-CM | POA: Insufficient documentation

## 2011-05-02 DIAGNOSIS — I2510 Atherosclerotic heart disease of native coronary artery without angina pectoris: Secondary | ICD-10-CM | POA: Insufficient documentation

## 2011-05-02 DIAGNOSIS — Z7982 Long term (current) use of aspirin: Secondary | ICD-10-CM | POA: Insufficient documentation

## 2011-05-02 LAB — DIFFERENTIAL
Basophils Absolute: 0.1 10*3/uL (ref 0.0–0.1)
Eosinophils Absolute: 0.1 10*3/uL (ref 0.0–0.7)
Eosinophils Relative: 1 % (ref 0–5)
Lymphocytes Relative: 42 % (ref 12–46)

## 2011-05-02 LAB — BASIC METABOLIC PANEL
CO2: 28 mEq/L (ref 19–32)
Calcium: 9.8 mg/dL (ref 8.4–10.5)
GFR calc Af Amer: >90 mL/min (ref >90)
GFR calc non Af Amer: >90 mL/min (ref >90)
Sodium: 140 mEq/L (ref 135–145)

## 2011-05-02 LAB — CBC
MCV: 85.9 fL (ref 78.0–100.0)
Platelets: 217 10*3/uL (ref 150–400)
RDW: 12 % (ref 11.5–15.5)
WBC: 9.9 10*3/uL (ref 4.0–10.5)

## 2011-05-02 MED ORDER — IBUPROFEN 800 MG PO TABS
800.0000 mg | ORAL_TABLET | Freq: Once | ORAL | Status: AC
  Administered 2011-05-02: 800 mg via ORAL
  Filled 2011-05-02: qty 1

## 2011-05-02 NOTE — ED Notes (Signed)
Pt. States had episode at home with complete LOC, pt. States she fell out of chair possibly striking head, c/o headache.

## 2011-05-02 NOTE — ED Notes (Signed)
Pt. States had Ablasion procedure in 12/11.  Esophageal study 11/12.

## 2011-05-02 NOTE — ED Notes (Signed)
Pt. In room sitting quietly, c/o headache, dizziness. Denies CP. Pt. AOx 4.

## 2011-05-02 NOTE — ED Provider Notes (Cosign Needed)
History     CSN: [id number]  Arrival date & time 05/02/11  2128   First MD Initiated Contact with Patient 05/02/11 2147      Chief Complaint  Patient presents with  . Loss of Consciousness    pt. had syncopal episode at home this PM    (Consider location/radiation/quality/duration/timing/severity/associated sxs/prior treatment) Patient is a 21 y.o. female presenting with syncope and head injury. The history is provided by the patient (Patient states that she felt dizzy she felt like her heart was beating fast and that she got weak and fell out of the chair hit her head and passed out. The patient states this has happened numerous times for her she is adopted Work up these problems.). No language interpreter was used.  Loss of Consciousness This is a chronic problem. The current episode started 1 to 2 hours ago. The problem occurs every several days. The problem has not changed since onset.Pertinent negatives include no chest pain, no abdominal pain and no headaches. The symptoms are aggravated by nothing. The symptoms are relieved by nothing. She has tried nothing for the symptoms. The treatment provided no relief.  Head Injury  The incident occurred 1 to 2 hours ago. She came to the ER via walk-in. The injury mechanism was a direct blow. She lost consciousness for a period of less than one minute. There was no blood loss. The pain is at a severity of 2/10. The pain is moderate. The pain has been constant since the injury. Pertinent negatives include no numbness. She has tried nothing for the symptoms. The treatment provided no relief.    Past Medical History  Diagnosis Date  . Coronary artery disease   . WPW (Wolff-Parkinson-White syndrome)     No past surgical history on file.  No family history on file.  History  Substance Use Topics  . Smoking status: Never Smoker   . Smokeless tobacco: Not on file  . Alcohol Use: No    OB History    Grav Para Term Preterm Abortions TAB  SAB Ect Mult Living                  Review of Systems  Constitutional: Negative for fatigue.  HENT: Negative for congestion, sinus pressure and ear discharge.   Eyes: Negative for discharge.  Respiratory: Negative for cough.   Cardiovascular: Positive for syncope. Negative for chest pain.  Gastrointestinal: Negative for abdominal pain and diarrhea.  Genitourinary: Negative for frequency and hematuria.  Musculoskeletal: Negative for back pain.  Skin: Negative for rash.  Neurological: Positive for light-headedness. Negative for seizures, numbness and headaches.  Hematological: Negative.   Psychiatric/Behavioral: Negative for hallucinations.    Allergies  Review of patient's allergies indicates no known allergies.  Home Medications   Current Outpatient Rx  Name Route Sig Dispense Refill  . ASPIRIN 81 MG PO TABS Oral Take 81 mg by mouth daily.     . ATENOLOL 25 MG PO TABS Oral Take 25 mg by mouth daily.       BP 119/66  Temp(Src) 99.5 F (37.5 C) (Oral)  Resp 20  SpO2 100%  Physical Exam  Constitutional: She is oriented to person, place, and time. She appears well-developed.  HENT:  Head: Normocephalic and atraumatic.  Eyes: Conjunctivae and EOM are normal. Pupils are equal, round, and reactive to light. No scleral icterus.  Neck: Neck supple. No thyromegaly present.  Cardiovascular: Normal rate and regular rhythm.  Exam reveals no gallop and no friction  rub.   No murmur heard. Pulmonary/Chest: No stridor. She has no wheezes. She has no rales. She exhibits no tenderness.  Abdominal: She exhibits no distension. There is no tenderness. There is no rebound.  Musculoskeletal: Normal range of motion. She exhibits no edema.  Lymphadenopathy:    She has no cervical adenopathy.  Neurological: She is oriented to person, place, and time. Coordination normal.  Skin: No rash noted. No erythema.  Psychiatric: She has a normal mood and affect. Her behavior is normal.    ED  Course  Procedures (including critical care time)  Labs Reviewed  CBC - Abnormal; Notable for the following:    Hemoglobin 11.5 (*)    HCT 35.2 (*)    All other components within normal limits  DIFFERENTIAL - Abnormal; Notable for the following:    Lymphs Abs 4.2 (*)    All other components within normal limits  BASIC METABOLIC PANEL   Ct Head Wo Contrast  05/02/2011  *RADIOLOGY REPORT*  Clinical Data: Syncope tonight.  Loss of consciousness.  48 Hz.  CT HEAD WITHOUT CONTRAST  Technique:  Contiguous axial images were obtained from the base of the skull through the vertex without contrast.  Comparison: 10/09/2010  Findings: The ventricles and sulci appear symmetrical.  No mass effect or midline shift.  No ventricular dilatation.  No abnormal extra-axial fluid collections.  Gray-white matter junctions are distinct.  Basal cisterns are not effaced.  No evidence of acute intracranial hemorrhage.  Vascular calcifications.  No depressed skull fractures.  Visualized paranasal sinuses are not opacified. No significant change since previous study.  IMPRESSION: No evidence of acute intracranial hemorrhage, mass lesion, or acute infarct.  Original Report Authenticated By: Marlon Pel, M.D.     1. Syncope       MDM  Palpitations,  Syncope.  Possible svt, anxiety        Benny Lennert, MD 05/02/11 2332  Benny Lennert, MD 05/02/11 [phone]

## 2011-05-05 ENCOUNTER — Other Ambulatory Visit: Payer: Self-pay

## 2011-05-05 ENCOUNTER — Encounter (HOSPITAL_COMMUNITY): Payer: Self-pay

## 2011-05-05 ENCOUNTER — Emergency Department (HOSPITAL_COMMUNITY)
Admission: EM | Admit: 2011-05-05 | Discharge: 2011-05-05 | Disposition: A | Discharge status: Home / Self Care | Payer: Medicaid Other | Attending: Emergency Medicine | Admitting: Emergency Medicine

## 2011-05-05 DIAGNOSIS — Z7982 Long term (current) use of aspirin: Secondary | ICD-10-CM | POA: Insufficient documentation

## 2011-05-05 DIAGNOSIS — R0602 Shortness of breath: Secondary | ICD-10-CM | POA: Insufficient documentation

## 2011-05-05 DIAGNOSIS — R079 Chest pain, unspecified: Secondary | ICD-10-CM | POA: Insufficient documentation

## 2011-05-05 DIAGNOSIS — R112 Nausea with vomiting, unspecified: Secondary | ICD-10-CM | POA: Insufficient documentation

## 2011-05-05 DIAGNOSIS — R002 Palpitations: Secondary | ICD-10-CM | POA: Insufficient documentation

## 2011-05-05 DIAGNOSIS — K5289 Other specified noninfective gastroenteritis and colitis: Secondary | ICD-10-CM | POA: Insufficient documentation

## 2011-05-05 DIAGNOSIS — R1013 Epigastric pain: Secondary | ICD-10-CM | POA: Insufficient documentation

## 2011-05-05 DIAGNOSIS — R197 Diarrhea, unspecified: Secondary | ICD-10-CM | POA: Insufficient documentation

## 2011-05-05 DIAGNOSIS — K529 Noninfective gastroenteritis and colitis, unspecified: Secondary | ICD-10-CM

## 2011-05-05 DIAGNOSIS — Z79899 Other long term (current) drug therapy: Secondary | ICD-10-CM | POA: Insufficient documentation

## 2011-05-05 DIAGNOSIS — M549 Dorsalgia, unspecified: Secondary | ICD-10-CM | POA: Insufficient documentation

## 2011-05-05 LAB — CBC
Platelets: 158 10*3/uL (ref 150–400)
RBC: 4.29 MIL/uL (ref 3.87–5.11)
WBC: 9 10*3/uL (ref 4.0–10.5)

## 2011-05-05 LAB — COMPREHENSIVE METABOLIC PANEL
ALT: 13 U/L (ref 0–35)
AST: 21 U/L (ref 0–37)
Alkaline Phosphatase: 58 U/L (ref 39–117)
CO2: 22 mEq/L (ref 19–32)
GFR calc Af Amer: >90 mL/min (ref >90)
GFR calc non Af Amer: >90 mL/min (ref >90)
Glucose, Bld: 102 mg/dL — ABNORMAL HIGH (ref 70–99)
Potassium: 3.8 mEq/L (ref 3.5–5.1)
Sodium: 137 mEq/L (ref 135–145)

## 2011-05-05 LAB — URINALYSIS, ROUTINE W REFLEX MICROSCOPIC
Bilirubin Urine: NEGATIVE
Glucose, UA: NEGATIVE mg/dL
Hgb urine dipstick: NEGATIVE
Protein, ur: NEGATIVE mg/dL
Specific Gravity, Urine: 1.023 (ref 1.005–1.030)
Urobilinogen, UA: 1 mg/dL (ref 0.0–1.0)

## 2011-05-05 LAB — DIFFERENTIAL
Lymphocytes Relative: 5 % — ABNORMAL LOW (ref 12–46)
Lymphs Abs: 0.5 10*3/uL — ABNORMAL LOW (ref 0.7–4.0)
Neutro Abs: 7.9 10*3/uL — ABNORMAL HIGH (ref 1.7–7.7)
Neutrophils Relative %: 88 % — ABNORMAL HIGH (ref 43–77)

## 2011-05-05 MED ORDER — ONDANSETRON HCL 4 MG/2ML IJ SOLN: 4.0000 mg | Freq: Once | INTRAMUSCULAR | Status: DC

## 2011-05-05 MED ORDER — PANTOPRAZOLE SODIUM 20 MG PO TBEC: 20.0000 mg | DELAYED_RELEASE_TABLET | Freq: Every day | ORAL | Status: DC

## 2011-05-05 MED ORDER — ONDANSETRON 8 MG PO TBDP: 8.0000 mg | ORAL_TABLET | Freq: Three times a day (TID) | ORAL | Status: AC | PRN

## 2011-05-05 MED ORDER — SODIUM CHLORIDE 0.9 % IV BOLUS (SEPSIS): 1000.0000 mL | Freq: Once | INTRAVENOUS | Status: DC

## 2011-05-05 MED ORDER — ONDANSETRON 4 MG PO TBDP
8.0000 mg | ORAL_TABLET | Freq: Once | ORAL | Status: AC
  Administered 2011-05-05: 8 mg via ORAL
  Filled 2011-05-05: qty 2

## 2011-05-05 MED ORDER — FENTANYL CITRATE 0.05 MG/ML IJ SOLN
50.0000 ug | Freq: Once | INTRAMUSCULAR | Status: DC
  Filled 2011-05-05: qty 2

## 2011-05-05 MED ORDER — HYDROCODONE-ACETAMINOPHEN 5-325 MG PO TABS: 1.0000 | ORAL_TABLET | Freq: Four times a day (QID) | ORAL | Status: AC | PRN

## 2011-05-05 MED ORDER — FENTANYL CITRATE 0.05 MG/ML IJ SOLN: 50.0000 ug | Freq: Once | INTRAMUSCULAR | Status: DC

## 2011-05-05 NOTE — ED Notes (Signed)
Attempted IV x2, called IV team to start.

## 2011-05-05 NOTE — ED Notes (Signed)
Pt presents with n/v/d/ x 1 week.  Pt reports L sided chest pain that began this morning. +shortness of breath and palpitations

## 2011-05-05 NOTE — ED Provider Notes (Signed)
History     CSN: [id number]  Arrival date & time 05/05/11  1057   First MD Initiated Contact with Patient 05/05/11 1111     11:36 AM HPI Patient reports nausea, vomiting, and diarrhea that began this morning. Reports symptoms associated with epigastric pain, back pain,chest pain, shortness of breath, and palpitations. Reports chest pain, shortness of breath, and palpitations feel like "Wolff-Parkinson-White symptoms." Denies syncope ( which she was seen here for 3 days ago). Denies fever, food contacts, sick contacts, urinary symptoms, vaginal symptoms, hematuria, hematochezia, hematemesis. Patient is a 21 y.o. female presenting with vomiting. The history is provided by the patient.  Emesis  This is a new problem. The problem has not changed since onset.The emesis has an appearance of stomach contents. There has been no fever. Associated symptoms include abdominal pain and diarrhea. Pertinent negatives include no arthralgias, no chills, no cough, no fever, no headaches, no myalgias, no sweats and no URI.    Past Medical History  Diagnosis Date  . Coronary artery disease   . WPW (Wolff-Parkinson-White syndrome)     History reviewed. No pertinent past surgical history.  No family history on file.  History  Substance Use Topics  . Smoking status: Never Smoker   . Smokeless tobacco: Not on file  . Alcohol Use: No    OB History    Grav Para Term Preterm Abortions TAB SAB Ect Mult Living                  Review of Systems  Constitutional: Negative for fever and chills.  Respiratory: Positive for shortness of breath. Negative for cough.   Cardiovascular: Positive for chest pain and palpitations.  Gastrointestinal: Positive for nausea, vomiting, abdominal pain and diarrhea. Negative for constipation.  Genitourinary: Negative for dysuria, urgency, frequency, hematuria, flank pain, vaginal discharge and vaginal pain.  Musculoskeletal: Negative for myalgias, back pain and  arthralgias.  Neurological: Negative for dizziness and headaches.  All other systems reviewed and are negative.    Allergies  Review of patient's allergies indicates no known allergies.  Home Medications   Current Outpatient Rx  Name Route Sig Dispense Refill  . ASPIRIN 81 MG PO TABS Oral Take 81 mg by mouth daily.     Marland Kitchen MELATONIN PO Oral Take 3 tablets by mouth every 4 (four) hours.      BP 110/61  Pulse 119  Temp(Src) 99.2 F (37.3 C) (Oral)  Resp 18  Ht 5\' 3"  (1.6 m)  Wt 143 lb (64.864 kg)  BMI 25.33 kg/m2  SpO2 100%  Physical Exam  Vitals reviewed. Constitutional: She is oriented to person, place, and time. Vital signs are normal. She appears well-developed and well-nourished.  HENT:  Head: Normocephalic and atraumatic.  Eyes: Conjunctivae are normal. Pupils are equal, round, and reactive to light.  Neck: Normal range of motion. Neck supple.  Cardiovascular: Normal rate, regular rhythm and normal heart sounds.  Exam reveals no friction rub.   No murmur heard. Pulmonary/Chest: Effort normal and breath sounds normal. She has no wheezes. She has no rhonchi. She has no rales. She exhibits no tenderness.  Abdominal: Soft. Bowel sounds are normal. She exhibits no distension and no mass. There is no tenderness. There is no rebound and no guarding.  Musculoskeletal: Normal range of motion.  Neurological: She is alert and oriented to person, place, and time. Coordination normal.  Skin: Skin is warm and dry. No rash noted. No erythema. No pallor.    ED Course  Procedures   ED ECG REPORT   Date: 05/05/2011  EKG Time: 12:42 PM  Rate: 120  Rhythm: sinus tachycardia  Axis: Normal   Intervals:none  ST&T Change: Twave flattening/neg in III, V3-V4  Narrative Interpretation: no significant changes    Results for orders placed during the hospital encounter of 05/05/11  CBC      Component Value Range   WBC 9.0  4.0 - 10.5 (K/uL)   RBC 4.29  3.87 - 5.11 (MIL/uL)    Hemoglobin 11.9 (*) 12.0 - 15.0 (g/dL)   HCT 16.1  09.6 - 04.5 (%)   MCV 85.3  78.0 - 100.0 (fL)   MCH 27.7  26.0 - 34.0 (pg)   MCHC 32.5  30.0 - 36.0 (g/dL)   RDW 40.9  81.1 - 91.4 (%)   Platelets 158  150 - 400 (K/uL)  DIFFERENTIAL      Component Value Range   Neutrophils Relative 88 (*) 43 - 77 (%)   Neutro Abs 7.9 (*) 1.7 - 7.7 (K/uL)   Lymphocytes Relative 5 (*) 12 - 46 (%)   Lymphs Abs 0.5 (*) 0.7 - 4.0 (K/uL)   Monocytes Relative 7  3 - 12 (%)   Monocytes Absolute 0.6  0.1 - 1.0 (K/uL)   Eosinophils Relative 0  0 - 5 (%)   Eosinophils Absolute 0.0  0.0 - 0.7 (K/uL)   Basophils Relative 0  0 - 1 (%)   Basophils Absolute 0.0  0.0 - 0.1 (K/uL)  COMPREHENSIVE METABOLIC PANEL      Component Value Range   Sodium 137  135 - 145 (mEq/L)   Potassium 3.8  3.5 - 5.1 (mEq/L)   Chloride 102  96 - 112 (mEq/L)   CO2 22  19 - 32 (mEq/L)   Glucose, Bld 102 (*) 70 - 99 (mg/dL)   BUN 15  6 - 23 (mg/dL)   Creatinine, Ser 7.82  0.50 - 1.10 (mg/dL)   Calcium 9.3  8.4 - 95.6 (mg/dL)   Total Protein 7.4  6.0 - 8.3 (g/dL)   Albumin 4.2  3.5 - 5.2 (g/dL)   AST 21  0 - 37 (U/L)   ALT 13  0 - 35 (U/L)   Alkaline Phosphatase 58  39 - 117 (U/L)   Total Bilirubin 0.5  0.3 - 1.2 (mg/dL)   GFR calc non Af Amer >90  >90 (mL/min)   GFR calc Af Amer >90  >90 (mL/min)  LIPASE, BLOOD      Component Value Range   Lipase 33  11 - 59 (U/L)  URINALYSIS, ROUTINE W REFLEX MICROSCOPIC      Component Value Range   Color, Urine YELLOW  YELLOW    APPearance CLEAR  CLEAR    Specific Gravity, Urine 1.023  1.005 - 1.030    pH 7.0  5.0 - 8.0    Glucose, UA NEGATIVE  NEGATIVE (mg/dL)   Hgb urine dipstick NEGATIVE  NEGATIVE    Bilirubin Urine NEGATIVE  NEGATIVE    Ketones, ur 15 (*) NEGATIVE (mg/dL)   Protein, ur NEGATIVE  NEGATIVE (mg/dL)   Urobilinogen, UA 1.0  0.0 - 1.0 (mg/dL)   Nitrite NEGATIVE  NEGATIVE    Leukocytes, UA NEGATIVE  NEGATIVE   POCT PREGNANCY, URINE      Component Value Range   Preg  Test, Ur NEGATIVE  NEGATIVE      MDM   3:00 PM Patient reports improved symptoms. Has been able to drink fluids while waiting for  labs. Will discharge home with analgesics and antiemetics. Mother is in room continuously asking why Demitri continues to pass out. I explained that we did a thorough evaluation when she was was here on January 28 2013th. Advise she continue to follow up with [facility] she's been doing for continued evaluation no acute findings of syncope were discovered on January 28. Repeat labs today did not show any significant findings. Slight elevation in nature account may indicate a viral gastroenteritis. Advised return to the emergency room for worsening symptoms such as worsening abdominal pain, intractable nausea and vomiting. Patient agrees with plan and is ready for discharge       Thomasene Lot, PA-C 05/05/11 1502

## 2011-05-05 NOTE — ED Notes (Signed)
Pt d/c home in NAD. Pt states "I feel a lot better."

## 2011-05-05 NOTE — ED Notes (Signed)
Brigitte and Dr. Fonnie Jarvis notified that IV team was unable to get line. Pt given ODT zofran and oral fluids.

## 2011-05-05 NOTE — ED Notes (Signed)
IV team at bedside 

## 2011-05-05 NOTE — Progress Notes (Signed)
Called to ER, Pod A-5 for iv start; staff RN has attempted x 3 without success;  Poor access; att x 1 myself, in the right hand, also without success; pt having diarrhea and vomiting;  RN aware of no iv access or lab draw;

## 2011-05-05 NOTE — ED Notes (Signed)
Pt in bed. Commode placed beside pt's bedside. NAD noted. Pt states "I feel a little bit better." Informed pt that she is waiting on IV team.

## 2011-05-05 NOTE — ED Provider Notes (Signed)
This 22 year old female states she is constant 24 hour a day chest discomfort which is chronic and not the reason for visit today, the reason for visit today for several days of vomiting and diarrhea.  Medical screening examination/treatment/procedure(s) were conducted as a shared visit with non-physician practitioner(s) and myself.  I personally evaluated the patient during the encounter  The patient feels much better the emergency department with ondansetron without the need for IV fluids and she is taking fluids now orally in the emergency room herself.  Hurman Horn, MD 05/06/11 [phone]

## 2011-05-05 NOTE — ED Notes (Signed)
Pt stated she did not feel well. Began to ambulate pt to bathroom in room. While instructing pt to sit on toilet, pt began to vomit and laid self down on floor, soiling pants and vomiting on floor instead of in emesis basin provided by this rn. Pt in NAD and did not fall or hit pt head.

## 2011-05-06 NOTE — ED Provider Notes (Signed)
Medical screening examination/treatment/procedure(s) were conducted as a shared visit with non-physician practitioner(s) and myself.  I personally evaluated the patient during the encounter  Hurman Horn, MD 05/06/11 [phone]

## 2011-07-20 ENCOUNTER — Emergency Department (HOSPITAL_COMMUNITY)
Admission: EM | Admit: 2011-07-20 | Discharge: 2011-07-21 | Disposition: A | Discharge status: Home / Self Care | Payer: Medicaid Other | Attending: Emergency Medicine | Admitting: Emergency Medicine

## 2011-07-20 ENCOUNTER — Encounter (HOSPITAL_COMMUNITY): Payer: Self-pay | Admitting: *Deleted

## 2011-07-20 DIAGNOSIS — R000 Tachycardia, unspecified: Secondary | ICD-10-CM | POA: Insufficient documentation

## 2011-07-20 DIAGNOSIS — Z79899 Other long term (current) drug therapy: Secondary | ICD-10-CM | POA: Insufficient documentation

## 2011-07-20 DIAGNOSIS — R51 Headache: Secondary | ICD-10-CM | POA: Insufficient documentation

## 2011-07-20 DIAGNOSIS — I456 Pre-excitation syndrome: Secondary | ICD-10-CM | POA: Insufficient documentation

## 2011-07-20 DIAGNOSIS — R55 Syncope and collapse: Secondary | ICD-10-CM | POA: Insufficient documentation

## 2011-07-20 DIAGNOSIS — I2510 Atherosclerotic heart disease of native coronary artery without angina pectoris: Secondary | ICD-10-CM | POA: Insufficient documentation

## 2011-07-20 DIAGNOSIS — Z7982 Long term (current) use of aspirin: Secondary | ICD-10-CM | POA: Insufficient documentation

## 2011-07-20 DIAGNOSIS — R42 Dizziness and giddiness: Secondary | ICD-10-CM | POA: Insufficient documentation

## 2011-07-20 DIAGNOSIS — E860 Dehydration: Secondary | ICD-10-CM

## 2011-07-20 HISTORY — DX: Syncope and collapse: R55

## 2011-07-20 LAB — CBC
HCT: 33.6 % — ABNORMAL LOW (ref 36.0–46.0)
MCH: 27.2 pg (ref 26.0–34.0)
MCV: 84.6 fL (ref 78.0–100.0)
Platelets: 235 10*3/uL (ref 150–400)
RBC: 3.97 MIL/uL (ref 3.87–5.11)
WBC: 7.6 10*3/uL (ref 4.0–10.5)

## 2011-07-20 LAB — URINALYSIS, ROUTINE W REFLEX MICROSCOPIC
Glucose, UA: NEGATIVE mg/dL
Ketones, ur: 40 mg/dL — AB
Leukocytes, UA: NEGATIVE
Nitrite: NEGATIVE
Specific Gravity, Urine: 1.005 (ref 1.005–1.030)
pH: 6 (ref 5.0–8.0)

## 2011-07-20 LAB — DIFFERENTIAL
Eosinophils Absolute: 0 10*3/uL (ref 0.0–0.7)
Eosinophils Relative: 0 % (ref 0–5)
Lymphocytes Relative: 33 % (ref 12–46)
Lymphs Abs: 2.5 10*3/uL (ref 0.7–4.0)
Monocytes Absolute: 0.3 10*3/uL (ref 0.1–1.0)

## 2011-07-20 LAB — POCT I-STAT, CHEM 8
BUN: 6 mg/dL (ref 6–23)
Calcium, Ion: 1.23 mmol/L (ref 1.12–1.32)
Chloride: 104 mEq/L (ref 96–112)
Creatinine, Ser: 0.8 mg/dL (ref 0.50–1.10)
Sodium: 141 mEq/L (ref 135–145)
TCO2: 23 mmol/L (ref 0–100)

## 2011-07-20 LAB — PREGNANCY, URINE: Preg Test, Ur: NEGATIVE

## 2011-07-20 MED ORDER — SODIUM CHLORIDE 0.9 % IV SOLN
Freq: Once | INTRAVENOUS | Status: AC
  Administered 2011-07-20: 20 mL/h via INTRAVENOUS
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
## 2011-07-20 NOTE — ED Notes (Signed)
Upon arrival, pt to b/r from EMS stretcher.

## 2011-07-20 NOTE — ED Notes (Signed)
Swallow screen passed, given snack and juice per request.
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
## 2011-10-01 ENCOUNTER — Encounter (HOSPITAL_COMMUNITY): Payer: Self-pay | Admitting: *Deleted

## 2011-10-01 ENCOUNTER — Emergency Department (HOSPITAL_COMMUNITY)
Admission: EM | Admit: 2011-10-01 | Discharge: 2011-10-01 | Disposition: A | Discharge status: Home / Self Care | Payer: Medicaid Other | Attending: Emergency Medicine | Admitting: Emergency Medicine

## 2011-10-01 DIAGNOSIS — Z7982 Long term (current) use of aspirin: Secondary | ICD-10-CM | POA: Insufficient documentation

## 2011-10-01 DIAGNOSIS — Z79899 Other long term (current) drug therapy: Secondary | ICD-10-CM | POA: Insufficient documentation

## 2011-10-01 DIAGNOSIS — R55 Syncope and collapse: Secondary | ICD-10-CM | POA: Insufficient documentation

## 2011-10-01 DIAGNOSIS — R110 Nausea: Secondary | ICD-10-CM | POA: Insufficient documentation

## 2011-10-01 DIAGNOSIS — R42 Dizziness and giddiness: Secondary | ICD-10-CM | POA: Insufficient documentation

## 2011-10-01 LAB — CBC WITH DIFFERENTIAL/PLATELET
Basophils Absolute: 0 10*3/uL (ref 0.0–0.1)
Basophils Relative: 0 % (ref 0–1)
Eosinophils Absolute: 0.1 10*3/uL (ref 0.0–0.7)
Eosinophils Relative: 2 % (ref 0–5)
HCT: 31.8 % — ABNORMAL LOW (ref 36.0–46.0)
Hemoglobin: 10.5 g/dL — ABNORMAL LOW (ref 12.0–15.0)
MCH: 28.2 pg (ref 26.0–34.0)
MCHC: 33 g/dL (ref 30.0–36.0)
MCV: 85.5 fL (ref 78.0–100.0)
Monocytes Absolute: 0.4 10*3/uL (ref 0.1–1.0)
Monocytes Relative: 4 % (ref 3–12)
Neutro Abs: 3.5 10*3/uL (ref 1.7–7.7)
RDW: 12.2 % (ref 11.5–15.5)

## 2011-10-01 LAB — URINALYSIS, ROUTINE W REFLEX MICROSCOPIC
Glucose, UA: NEGATIVE mg/dL
Leukocytes, UA: NEGATIVE
Protein, ur: NEGATIVE mg/dL
Specific Gravity, Urine: 1.015 (ref 1.005–1.030)
Urobilinogen, UA: 0.2 mg/dL (ref 0.0–1.0)

## 2011-10-01 LAB — COMPREHENSIVE METABOLIC PANEL
AST: 15 U/L (ref 0–37)
Albumin: 4.1 g/dL (ref 3.5–5.2)
BUN: 11 mg/dL (ref 6–23)
Calcium: 9.5 mg/dL (ref 8.4–10.5)
Chloride: 104 mEq/L (ref 96–112)
Creatinine, Ser: 0.8 mg/dL (ref 0.50–1.10)
Total Bilirubin: 0.2 mg/dL — ABNORMAL LOW (ref 0.3–1.2)

## 2011-10-01 LAB — RAPID URINE DRUG SCREEN, HOSP PERFORMED
Barbiturates: NOT DETECTED
Opiates: NOT DETECTED
Tetrahydrocannabinol: NOT DETECTED

## 2011-10-01 LAB — D-DIMER, QUANTITATIVE: D-Dimer, Quant: 0.26 ug/mL-FEU (ref 0.00–0.48)

## 2011-10-01 LAB — PREGNANCY, URINE: Preg Test, Ur: NEGATIVE

## 2011-10-01 MED ORDER — ONDANSETRON HCL 4 MG/2ML IJ SOLN
4.0000 mg | Freq: Once | INTRAMUSCULAR | Status: AC
  Administered 2011-10-01: 4 mg via INTRAVENOUS
  Filled 2011-10-01: qty 2

## 2011-10-01 MED ORDER — TRAMADOL HCL 50 MG PO TABS
50.0000 mg | ORAL_TABLET | Freq: Once | ORAL | Status: AC
  Administered 2011-10-01: 50 mg via ORAL
  Filled 2011-10-01: qty 1

## 2011-10-01 MED ORDER — SODIUM CHLORIDE 0.9 % IV BOLUS (SEPSIS)
1000.0000 mL | Freq: Once | INTRAVENOUS | Status: AC
  Administered 2011-10-01: 1000 mL via INTRAVENOUS

## 2011-10-01 NOTE — Discharge Instructions (Signed)
Syncope  Syncope (fainting) is a sudden, short loss of consciousness. People normally fall to the ground when they faint. Recovery is often fast.  HOME CARE   Do not drive or use machines. Wait until your doctor says it is safe to do so.    If you have diabetes, check your blood sugar. If it is low (below 70), you need to drink or eat something sweet. If over 300, call your doctor.    If you have a blood pressure machine at home, take your blood pressure. If the top number is below 100 or above 170, call your doctor.    Lie down until you feel normal.    Drink extra fluids (water, juice, soup).   GET HELP RIGHT AWAY IF:     You pass out (faint) when sitting or lying down. Do not drive. Call your local emergency services (911 in [place] if no one is there to help you.    There is chest pain.    You feel sick to your stomach (nauseous) or keep throwing up (vomiting).    You have very bad belly (abdominal) pain.    You feel your heartbeat is fast or not normal.    You lose feeling in some part of the body.    You cannot move your arms or legs.    You cannot talk well and get confused.    You feel weak or cannot see well.    You get sweaty and feel lightheaded.   MAKE SURE YOU:     Understand these instructions.    Will watch your condition.    Will get help right away if you are not doing well or get worse.   Document Released: 09/07/2007 Document Revised: 03/10/2011 Document Reviewed: 09/07/2007  [employer] Patient Information 2012 [employer].
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
## 2011-10-01 NOTE — ED Notes (Signed)
Pt found in supine position at the [employer] [place]. BF states the patient has been "in and out" of consciousness. On ems arrival, pt alert oriented. Pt history of POTS and WPW. 12 lead ekg normal. Unable to obtain IV.

## 2011-11-06 ENCOUNTER — Other Ambulatory Visit: Payer: Self-pay

## 2011-11-06 ENCOUNTER — Emergency Department (HOSPITAL_COMMUNITY)
Admission: EM | Admit: 2011-11-06 | Discharge: 2011-11-06 | Disposition: A | Discharge status: Home / Self Care | Payer: Medicaid Other | Attending: Emergency Medicine | Admitting: Emergency Medicine

## 2011-11-06 ENCOUNTER — Encounter (HOSPITAL_COMMUNITY): Payer: Self-pay | Admitting: *Deleted

## 2011-11-06 DIAGNOSIS — R55 Syncope and collapse: Secondary | ICD-10-CM | POA: Insufficient documentation

## 2011-11-06 DIAGNOSIS — I2510 Atherosclerotic heart disease of native coronary artery without angina pectoris: Secondary | ICD-10-CM | POA: Insufficient documentation

## 2011-11-06 DIAGNOSIS — I456 Pre-excitation syndrome: Secondary | ICD-10-CM | POA: Insufficient documentation

## 2011-11-06 DIAGNOSIS — I498 Other specified cardiac arrhythmias: Secondary | ICD-10-CM | POA: Insufficient documentation

## 2011-11-06 DIAGNOSIS — R002 Palpitations: Secondary | ICD-10-CM | POA: Insufficient documentation

## 2011-11-06 HISTORY — DX: Other specified cardiac arrhythmias: I49.8

## 2011-11-06 HISTORY — DX: Tachycardia, unspecified: R00.0

## 2011-11-06 HISTORY — DX: Orthostatic hypotension: I95.1

## 2011-11-06 HISTORY — DX: Postural orthostatic tachycardia syndrome (POTS): G90.A

## 2011-11-06 LAB — URINALYSIS, ROUTINE W REFLEX MICROSCOPIC
Bilirubin Urine: NEGATIVE
Ketones, ur: NEGATIVE mg/dL
Nitrite: NEGATIVE
Urobilinogen, UA: 1 mg/dL (ref 0.0–1.0)
pH: 7 (ref 5.0–8.0)

## 2011-11-06 LAB — URINE MICROSCOPIC-ADD ON

## 2011-11-06 LAB — BASIC METABOLIC PANEL
CO2: 24 mEq/L (ref 19–32)
Glucose, Bld: 91 mg/dL (ref 70–99)
Potassium: 3.9 mEq/L (ref 3.5–5.1)
Sodium: 138 mEq/L (ref 135–145)

## 2011-11-06 LAB — CBC
HCT: 32.2 % — ABNORMAL LOW (ref 36.0–46.0)
Hemoglobin: 10.4 g/dL — ABNORMAL LOW (ref 12.0–15.0)
RBC: 3.78 MIL/uL — ABNORMAL LOW (ref 3.87–5.11)

## 2011-11-06 MED ORDER — SODIUM CHLORIDE 0.9 % IV BOLUS (SEPSIS)
500.0000 mL | Freq: Once | INTRAVENOUS | Status: AC
  Administered 2011-11-06: 500 mL via INTRAVENOUS

## 2011-11-06 NOTE — ED Notes (Signed)
PAtient discharged with her mother NAD noted at time of D/C.

## 2011-11-06 NOTE — ED Provider Notes (Signed)
1:16 PM  Date: 11/06/2011  Rate: 80  Rhythm: normal sinus rhythm  QRS Axis: normal  Intervals: normal  ST/T Wave abnormalities: normal  Conduction Disutrbances:none  Narrative Interpretation: Normal EKG  Old EKG Reviewed: unchanged    Carleene Cooper III, MD 11/06/11 [phone]

## 2011-11-06 NOTE — ED Notes (Signed)
Reports having syncopal episode this am, denies recent illness. Reports hitting her head when she fell and now having dizziness. Pt crying and dramatic at triage. No acute distress noted at this time.

## 2011-11-06 NOTE — ED Provider Notes (Signed)
History     CSN: [id number]  Arrival date & time 11/06/11  1233   First MD Initiated Contact with Patient 11/06/11 1253      Chief Complaint  Patient presents with  . Loss of Consciousness    (Consider location/radiation/quality/duration/timing/severity/associated sxs/prior treatment) HPI Comments: Desiree Gutierrez 21 y.o. female   The chief complaint is: Patient presents with:   Loss of Consciousness   The patient has medical history significant for:   Past Medical History:   Coronary artery disease                                      WPW (Wolff-Parkinson-White syndrome)                         Syncope                                                      Vasovagal syncope                                            POTS (postural orthostatic tachycardia syndrom*             The onset of the symptoms was  abrupt starting 3 hours ago,  The Course is  intermittent, completely resolved nothing makes symptoms worse, nothingmakes symptoms better Has associated headache Denies cough, congestion, nausea, vomiting, diarrhea, constipation, abdominal pain, weakness, numbness or tingling in her extremities, neck pain, back pain.        The history is provided by the patient and medical records. History Limited By: none.    Desiree Gutierrez is a 21 y.o. female who presents to the emergency department after a syncopal episode at approximately 12:30.  She has a history of Wolff-Parkinson-White, vasovagal syncope and POTS.  She sees the doctor Mary Sella with [facility] cardiology. Today while outside she felt weak, had palpitations, became dizzy and had a syncopal episode. She states she hit her head on the ground, but she remembers this.  The episode felt like all of her other syncopal episodes. She took her medications as prescribed this morning. She had associated chest pain and shortness of breath before the episode. She denies cough, congestion, nausea, vomiting, diarrhea, constipation,  abdominal pain, weakness, numbness or tingling in her extremities, neck pain, back pain.  She does not know of any triggers for the episodes. Nothing makes them better or worse. She does not believe the Promatine is helping.  Past Medical History  Diagnosis Date  . Coronary artery disease   . WPW (Wolff-Parkinson-White syndrome)   . Syncope   . Vasovagal syncope   . POTS (postural orthostatic tachycardia syndrome)     Past Surgical History  Procedure Date  . Cardiac electrophysiology study and ablation     History reviewed. No pertinent family history.  History  Substance Use Topics  . Smoking status: Never Smoker   . Smokeless tobacco: Not on file  . Alcohol Use: No    OB History    Grav Para Term Preterm Abortions TAB SAB Ect Mult Living  Review of Systems  Constitutional: Negative for fever, diaphoresis, appetite change, fatigue and unexpected weight change.  HENT: Negative for mouth sores, neck pain and neck stiffness.   Eyes: Negative for visual disturbance.  Respiratory: Positive for chest tightness. Negative for cough, shortness of breath and wheezing.   Cardiovascular: Positive for chest pain.  Gastrointestinal: Negative for nausea, vomiting, abdominal pain, diarrhea and constipation.  Genitourinary: Negative for dysuria, urgency, frequency and hematuria.  Musculoskeletal: Negative for back pain.  Skin: Negative for rash.  Neurological: Positive for dizziness, syncope, light-headedness and headaches. Negative for weakness.  Hematological: Does not bruise/bleed easily.  Psychiatric/Behavioral: Negative for disturbed wake/sleep cycle. The patient is not nervous/anxious.     Allergies  Review of patient's allergies indicates no known allergies.  Home Medications   Current Outpatient Rx  Name Route Sig Dispense Refill  . BC HEADACHE PO Oral Take 1 packet by mouth 2 (two) times daily as needed. For headache    . MELATONIN 3 MG PO TABS Oral Take  3 mg by mouth daily at 8 pm.    . MIDODRINE HCL 5 MG PO TABS Oral Take 5 mg by mouth daily.    Marland Kitchen PSEUDOEPHEDRINE HCL 30 MG PO TABS Oral Take 30 mg by mouth 2 (two) times daily.    Marland Kitchen PYRIDOSTIGMINE BROMIDE 60 MG PO TABS Oral Take 45 mg by mouth every 4 (four) hours.       BP 127/75  Pulse 83  Temp 98.1 F (36.7 C) (Oral)  Resp 18  SpO2 100%  LMP 10/31/2011  Physical Exam  Nursing note and vitals reviewed. Constitutional: She is oriented to person, place, and time. She appears well-developed and well-nourished. No distress.  HENT:  Head: Normocephalic and atraumatic.  Mouth/Throat: Oropharynx is clear and moist. No oropharyngeal exudate.  Eyes: Conjunctivae and EOM are normal. Pupils are equal, round, and reactive to light. No scleral icterus.  Neck: Normal range of motion. Neck supple.  Cardiovascular: Normal rate, regular rhythm, normal heart sounds and intact distal pulses.  Exam reveals no gallop and no friction rub.   No murmur heard. Pulmonary/Chest: Effort normal and breath sounds normal. No respiratory distress. She has no wheezes.  Abdominal: Soft. Bowel sounds are normal. She exhibits no mass. There is no tenderness. There is no rebound and no guarding.  Musculoskeletal: Normal range of motion. She exhibits no edema.       No pain to palpation of the spinous processes or paraspinal muscles.  Full ROM without pain.     Lymphadenopathy:    She has no cervical adenopathy.  Neurological: She is alert and oriented to person, place, and time. She has normal strength and normal reflexes. No cranial nerve deficit or sensory deficit. She exhibits normal muscle tone. Coordination normal. GCS eye subscore is 4. GCS verbal subscore is 5. GCS motor subscore is 6.  Reflex Scores:      Tricep reflexes are 2+ on the right side and 2+ on the left side.      Bicep reflexes are 2+ on the right side and 2+ on the left side.      Brachioradialis reflexes are 2+ on the right side and 2+ on the  left side.      Patellar reflexes are 2+ on the right side and 2+ on the left side.      Achilles reflexes are 2+ on the right side and 2+ on the left side.      Speech is clear and goal  oriented, follows commands Cranial nerves III - XII without deficit, no facial droop Normal strength in upper and lower extremities bilaterally, strong and equal grip strength Sensation normal to light and sharp touch Moves extremities without ataxia, coordination intact Normal finger to nose and rapid alternating movements Normal heel-shin and balance   Skin: Skin is warm and dry. She is not diaphoretic.  Psychiatric: Her behavior is normal. Judgment and thought content normal.       Pt is tearful throughout exam    ED Course  Procedures (including critical care time)  Labs Reviewed  CBC - Abnormal; Notable for the following:    RBC 3.78 (*)     Hemoglobin 10.4 (*)     HCT 32.2 (*)     All other components within normal limits  URINALYSIS, ROUTINE W REFLEX MICROSCOPIC - Abnormal; Notable for the following:    Hgb urine dipstick TRACE (*)     Leukocytes, UA TRACE (*)     All other components within normal limits  URINE MICROSCOPIC-ADD ON - Abnormal; Notable for the following:    Squamous Epithelial / LPF MANY (*)     Bacteria, UA FEW (*)     All other components within normal limits  BASIC METABOLIC PANEL  POCT PREGNANCY, URINE   No results found. Results for orders placed during the hospital encounter of 11/06/11  CBC      Component Value Range   WBC 6.5  4.0 - 10.5 K/uL   RBC 3.78 (*) 3.87 - 5.11 MIL/uL   Hemoglobin 10.4 (*) 12.0 - 15.0 g/dL   HCT 16.1 (*) 09.6 - 04.5 %   MCV 85.2  78.0 - 100.0 fL   MCH 27.5  26.0 - 34.0 pg   MCHC 32.3  30.0 - 36.0 g/dL   RDW 40.9  81.1 - 91.4 %   Platelets 248  150 - 400 K/uL  BASIC METABOLIC PANEL      Component Value Range   Sodium 138  135 - 145 mEq/L   Potassium 3.9  3.5 - 5.1 mEq/L   Chloride 105  96 - 112 mEq/L   CO2 24  19 - 32 mEq/L    Glucose, Bld 91  70 - 99 mg/dL   BUN 9  6 - 23 mg/dL   Creatinine, Ser 7.82  0.50 - 1.10 mg/dL   Calcium 9.1  8.4 - 95.6 mg/dL   GFR calc non Af Amer >90  >90 mL/min   GFR calc Af Amer >90  >90 mL/min  URINALYSIS, ROUTINE W REFLEX MICROSCOPIC      Component Value Range   Color, Urine YELLOW  YELLOW   APPearance CLEAR  CLEAR   Specific Gravity, Urine 1.007  1.005 - 1.030   pH 7.0  5.0 - 8.0   Glucose, UA NEGATIVE  NEGATIVE mg/dL   Hgb urine dipstick TRACE (*) NEGATIVE   Bilirubin Urine NEGATIVE  NEGATIVE   Ketones, ur NEGATIVE  NEGATIVE mg/dL   Protein, ur NEGATIVE  NEGATIVE mg/dL   Urobilinogen, UA 1.0  0.0 - 1.0 mg/dL   Nitrite NEGATIVE  NEGATIVE   Leukocytes, UA TRACE (*) NEGATIVE  POCT PREGNANCY, URINE      Component Value Range   Preg Test, Ur NEGATIVE  NEGATIVE  URINE MICROSCOPIC-ADD ON      Component Value Range   Squamous Epithelial / LPF MANY (*) RARE   WBC, UA 0-2  <3 WBC/hpf   RBC / HPF 0-2  <3 RBC/hpf  Bacteria, UA FEW (*) RARE   No results found.     1. Palpitations   2. Vasovagal syncope       MDM  Desiree Gutierrez presents after a syncopal episode.  She reports that this was the same as others.  Her vitals are WNL upon arrival at the ED.  I suspect this was a vaso-vagal episode, but will obtain some labs to ensure there was no specific trigger.  I do not suspect a head injury as her neurological exam is normal and she has no neck or back pain.  Labs show a mild anemia, but not abnormal for the patient.  ECG is normal sinus rhythm, without abnormality.  CBC, BMP are unremarkable and UA appears to be a contaminated sample.  She states she feels better and is amenable to be discharged home.    1. Medications: usual home medications 2. Treatment: rest, hydration 3. Follow Up: with Cardiology as needed         Dierdre Forth, PA-C 11/06/11 1543

## 2011-11-06 NOTE — Progress Notes (Signed)
1:17 PM  Date: 11/06/2011  Rate: 97  Rhythm: normal sinus rhythm and premature atrial contractions (PAC)  QRS Axis: right  Intervals: QT prolonged  ST/T Wave abnormalities: nonspecific ST/T changes  Conduction Disutrbances:none  Narrative Interpretation: Abnormal EKG  Old EKG Reviewed: none available

## 2011-11-06 NOTE — Progress Notes (Signed)
3:41 PM 21 yo woman fainted today.  She has a history of WPW with prior ablation at [facility].  Exam is benign and lab tests are normal.  Reassured and released.

## 2011-11-06 NOTE — ED Provider Notes (Signed)
Medical screening examination/treatment/procedure(s) were conducted as a shared visit with non-physician practitioner(s) and myself.  I personally evaluated the patient during the encounter   Carleene Cooper III, MD 11/06/11 [phone]

## 2011-11-29 ENCOUNTER — Encounter (HOSPITAL_COMMUNITY): Payer: Self-pay

## 2011-11-29 ENCOUNTER — Emergency Department (HOSPITAL_COMMUNITY): Payer: Self-pay

## 2011-11-29 ENCOUNTER — Emergency Department (INDEPENDENT_AMBULATORY_CARE_PROVIDER_SITE_OTHER): Payer: Self-pay

## 2011-11-29 ENCOUNTER — Emergency Department (INDEPENDENT_AMBULATORY_CARE_PROVIDER_SITE_OTHER)
Admission: EM | Admit: 2011-11-29 | Discharge: 2011-11-29 | Disposition: A | Discharge status: Home / Self Care | Payer: Self-pay | Source: Home / Self Care | Attending: Emergency Medicine | Admitting: Emergency Medicine

## 2011-11-29 DIAGNOSIS — R079 Chest pain, unspecified: Secondary | ICD-10-CM

## 2011-11-29 DIAGNOSIS — R0781 Pleurodynia: Secondary | ICD-10-CM

## 2011-11-29 MED ORDER — IBUPROFEN 600 MG PO TABS: 600.0000 mg | ORAL_TABLET | Freq: Four times a day (QID) | ORAL | Status: AC | PRN
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
## 2011-11-30 NOTE — ED Provider Notes (Signed)
Medical screening examination/treatment/procedure(s) were performed by non-physician practitioner and as supervising physician I was immediately available for consultation/collaboration.  Anjelo Pullman M. MD   Amery Vandenbos M Alecsander Hattabaugh, MD 11/30/11 2101 

## 2012-07-09 ENCOUNTER — Encounter (HOSPITAL_COMMUNITY): Payer: Self-pay | Admitting: Vascular Surgery

## 2012-07-09 ENCOUNTER — Emergency Department (HOSPITAL_COMMUNITY)
Admission: EM | Admit: 2012-07-09 | Discharge: 2012-07-10 | Disposition: A | Discharge status: Home / Self Care | Payer: Medicaid Other | Attending: Emergency Medicine | Admitting: Emergency Medicine

## 2012-07-09 ENCOUNTER — Emergency Department (HOSPITAL_COMMUNITY): Payer: Medicaid Other

## 2012-07-09 DIAGNOSIS — R55 Syncope and collapse: Secondary | ICD-10-CM

## 2012-07-09 DIAGNOSIS — I498 Other specified cardiac arrhythmias: Secondary | ICD-10-CM | POA: Insufficient documentation

## 2012-07-09 DIAGNOSIS — M542 Cervicalgia: Secondary | ICD-10-CM | POA: Insufficient documentation

## 2012-07-09 DIAGNOSIS — I456 Pre-excitation syndrome: Secondary | ICD-10-CM | POA: Insufficient documentation

## 2012-07-09 DIAGNOSIS — Z79899 Other long term (current) drug therapy: Secondary | ICD-10-CM | POA: Insufficient documentation

## 2012-07-09 DIAGNOSIS — Z3202 Encounter for pregnancy test, result negative: Secondary | ICD-10-CM | POA: Insufficient documentation

## 2012-07-09 DIAGNOSIS — I2510 Atherosclerotic heart disease of native coronary artery without angina pectoris: Secondary | ICD-10-CM | POA: Insufficient documentation

## 2012-07-09 LAB — URINALYSIS, ROUTINE W REFLEX MICROSCOPIC
Glucose, UA: NEGATIVE mg/dL
Leukocytes, UA: NEGATIVE
Specific Gravity, Urine: 1.007 (ref 1.005–1.030)
pH: 7.5 (ref 5.0–8.0)

## 2012-07-09 LAB — CBC WITH DIFFERENTIAL/PLATELET
Basophils Absolute: 0 10*3/uL (ref 0.0–0.1)
Eosinophils Relative: 2 % (ref 0–5)
Lymphocytes Relative: 43 % (ref 12–46)
Lymphs Abs: 3 10*3/uL (ref 0.7–4.0)
MCV: 85.6 fL (ref 78.0–100.0)
Neutro Abs: 3.5 10*3/uL (ref 1.7–7.7)
Platelets: 259 10*3/uL (ref 150–400)
RBC: 3.95 MIL/uL (ref 3.87–5.11)
WBC: 7 10*3/uL (ref 4.0–10.5)

## 2012-07-09 LAB — BASIC METABOLIC PANEL
CO2: 25 mEq/L (ref 19–32)
Calcium: 9.8 mg/dL (ref 8.4–10.5)
Chloride: 104 mEq/L (ref 96–112)
Glucose, Bld: 99 mg/dL (ref 70–99)
Potassium: 4.3 mEq/L (ref 3.5–5.1)
Sodium: 138 mEq/L (ref 135–145)

## 2012-07-09 LAB — URINE MICROSCOPIC-ADD ON

## 2012-07-09 LAB — POCT PREGNANCY, URINE: Preg Test, Ur: NEGATIVE

## 2012-07-09 MED ORDER — SODIUM CHLORIDE 0.9 % IV BOLUS (SEPSIS)
1000.0000 mL | Freq: Once | INTRAVENOUS | Status: AC
  Administered 2012-07-09: 1000 mL via INTRAVENOUS

## 2012-07-09 MED ORDER — IBUPROFEN 200 MG PO TABS
600.0000 mg | ORAL_TABLET | Freq: Once | ORAL | Status: AC
  Administered 2012-07-09: 600 mg via ORAL
  Filled 2012-07-09: qty 3
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
## 2012-07-09 NOTE — ED Provider Notes (Signed)
History     CSN: [id number]  Arrival date & time 07/09/12  2050   First MD Initiated Contact with Patient 07/09/12 2051      Chief Complaint  Patient presents with  . Loss of Consciousness    (Consider location/radiation/quality/duration/timing/severity/associated sxs/prior treatment) HPI Comments: Patient is a 22 y/o female with PMH of WPW and POTS who presents after having a syncopal episode while at school today. Patient states that she was walking back to class from the restroom when she started to feel diaphoretic and dizzy with palpitations. Patient felt like she was going to pass out and started moving towards the floor when she lost consciousness. Patient unsure of how long she was unconscious but states that she was alert to person, place, and time immediately after regaining consciousness. Patient endorses hitting her head from the fall with a headache at this time; also complaining of neck pain. Patient states that she loses consciousness quite frequently, approximately 3-4 times a month. Patient states that the symptoms that she experienced prior to losing consciousness for the same that she usually experiences during these episodes. Patient is followed by Dr. Bascom Levels at [facility] for her POTS and WPW. Patient underwent an ablation in October of 2010 which patient states was unsuccessful. Patient denies fever, visual disturbances, hearing loss or tinnitus, difficulty swallowing, chest pain or shortness of breath, nausea or vomiting, abdominal pain, numbness or tingling in her extremities, and weakness.  Patient is a 22 y.o. female presenting with syncope. The history is provided by the patient. No language interpreter was used.  Loss of Consciousness  Associated symptoms include diaphoresis, dizziness and palpitations. Pertinent negatives include abdominal pain, back pain, chest pain, fever, nausea, vomiting and weakness.    Past Medical History  Diagnosis Date  . Coronary artery  disease   . WPW (Wolff-Parkinson-White syndrome)   . Syncope   . Vasovagal syncope   . POTS (postural orthostatic tachycardia syndrome)     Past Surgical History  Procedure Laterality Date  . Cardiac electrophysiology study and ablation      History reviewed. No pertinent family history.  History  Substance Use Topics  . Smoking status: Never Smoker   . Smokeless tobacco: Not on file  . Alcohol Use: No    OB History   Grav Para Term Preterm Abortions TAB SAB Ect Mult Living                  Review of Systems  Constitutional: Positive for diaphoresis. Negative for fever.  HENT: Positive for neck pain. Negative for trouble swallowing, voice change and tinnitus.   Eyes: Negative for pain and visual disturbance.  Respiratory: Negative for chest tightness and shortness of breath.   Cardiovascular: Positive for palpitations and syncope. Negative for chest pain.  Gastrointestinal: Negative for nausea, vomiting and abdominal pain.  Musculoskeletal: Negative for back pain.  Skin: Negative for color change and wound.  Neurological: Positive for dizziness and syncope. Negative for weakness and numbness.  All other systems reviewed and are negative.    Allergies  Review of patient's allergies indicates no known allergies.  Home Medications   Current Outpatient Rx  Name  Route  Sig  Dispense  Refill  . Melatonin 3 MG TABS   Oral   Take 3 mg by mouth daily at 8 pm.         . midodrine (PROAMATINE) 5 MG tablet   Oral   Take 5 mg by mouth daily.         Marland Kitchen  propranolol (INDERAL) 10 MG tablet   Oral   Take 10 mg by mouth 2 (two) times daily.         . sodium chloride 1 G tablet   Oral   Take 1 g by mouth 3 (three) times daily.           BP 106/55  Pulse 83  Temp(Src) 98.8 F (37.1 C) (Oral)  Resp 21  SpO2 100%  LMP 07/02/2012  Physical Exam  Nursing note and vitals reviewed. Constitutional: She is oriented to person, place, and time. She appears  well-developed and well-nourished. No distress.  HENT:  Head: Normocephalic and atraumatic.  Right Ear: External ear normal.  Left Ear: External ear normal.  Mouth/Throat: Oropharynx is clear and moist. No oropharyngeal exudate.  Symmetric rise of the uvula with phonation  Eyes: Conjunctivae and EOM are normal. Pupils are equal, round, and reactive to light. No scleral icterus.  Neck:  Patient in cervical collar for neck pain 2/2 fall following LOC  Cardiovascular: Normal rate, regular rhythm, normal heart sounds and intact distal pulses.   Distal radial, dorsalis pedis, and posterior tibial pulses 2+ b/l  Pulmonary/Chest: Effort normal and breath sounds normal. No respiratory distress. She has no wheezes. She has no rales.  Abdominal: Soft. Bowel sounds are normal. She exhibits no distension. There is no tenderness. There is no rebound and no guarding.  Musculoskeletal: Normal range of motion. She exhibits no edema.  Neurological: She is alert and oriented to person, place, and time. She has normal reflexes. No cranial nerve deficit.  Cranial nerves II through XII grossly intact. Patient has equal grip strength bilaterally 5 out of 5 strength against resistance of her upper and lower extremities. DTRs normal and symmetric. No sensory or motor deficits appreciated.  Skin: Skin is warm and dry. No rash noted. She is not diaphoretic. No erythema.  Psychiatric: She has a normal mood and affect. Her behavior is normal.    ED Course  Procedures (including critical care time)  Labs Reviewed  CBC WITH DIFFERENTIAL - Abnormal; Notable for the following:    Hemoglobin 11.3 (*)    HCT 33.8 (*)    All other components within normal limits  URINALYSIS, ROUTINE W REFLEX MICROSCOPIC - Abnormal; Notable for the following:    Color, Urine STRAW (*)    APPearance CLOUDY (*)    Hgb urine dipstick TRACE (*)    All other components within normal limits  URINE MICROSCOPIC-ADD ON - Abnormal; Notable for  the following:    Squamous Epithelial / LPF FEW (*)    All other components within normal limits  BASIC METABOLIC PANEL  POCT PREGNANCY, URINE   Ct Head Wo Contrast  07/09/2012  *RADIOLOGY REPORT*  Clinical Data:  Loss of consciousness.  Fall ossicle.  CT HEAD WITHOUT CONTRAST CT CERVICAL SPINE WITHOUT CONTRAST  Technique:  Multidetector CT imaging of the head and cervical spine was performed following the standard protocol without intravenous contrast.  Multiplanar CT image reconstructions of the cervical spine were also generated.  Comparison:  CT head without contrast 05/02/2011.  CT HEAD  Findings: No acute intracranial abnormality is present. Specifically, there is no evidence for acute infarct, hemorrhage, mass, hydrocephalus, or extra-axial fluid collection.  The paranasal sinuses and mastoid air cells are clear.  The globes and orbits are intact.  The osseous skull is intact.  IMPRESSION: Negative CT head  CT CERVICAL SPINE  Findings: The cervical spine is imaged from skull base through  T3- 4.  The vertebral body heights and alignment maintained.  No acute fracture or traumatic subluxation is evident.  The soft tissues are unremarkable.  The lung apices are clear.  IMPRESSION: Negative CT of the cervical spine.   Original Report Authenticated By: Marin Roberts, M.D.    Ct Cervical Spine Wo Contrast  07/09/2012  *RADIOLOGY REPORT*  Clinical Data:  Loss of consciousness.  Fall ossicle.  CT HEAD WITHOUT CONTRAST CT CERVICAL SPINE WITHOUT CONTRAST  Technique:  Multidetector CT imaging of the head and cervical spine was performed following the standard protocol without intravenous contrast.  Multiplanar CT image reconstructions of the cervical spine were also generated.  Comparison:  CT head without contrast 05/02/2011.  CT HEAD  Findings: No acute intracranial abnormality is present. Specifically, there is no evidence for acute infarct, hemorrhage, mass, hydrocephalus, or extra-axial fluid  collection.  The paranasal sinuses and mastoid air cells are clear.  The globes and orbits are intact.  The osseous skull is intact.  IMPRESSION: Negative CT head  CT CERVICAL SPINE  Findings: The cervical spine is imaged from skull base through T3- 4.  The vertebral body heights and alignment maintained.  No acute fracture or traumatic subluxation is evident.  The soft tissues are unremarkable.  The lung apices are clear.  IMPRESSION: Negative CT of the cervical spine.   Original Report Authenticated By: Marin Roberts, M.D.      Date: 07/09/2012  Rate: 78  Rhythm: normal sinus rhythm  QRS Axis: normal  Intervals: normal  ST/T Wave abnormalities: normal  Conduction Disutrbances:none  Narrative Interpretation: NSR; no evidence of WPW or STEMI  Old EKG Reviewed: none available   1. Syncope     MDM  Patient is a 22 year old female with a history of WPW and POTS who presents after having a syncopal episode while at school. Patient endorses 3-4 syncopal episodes per month; states that symptoms prior to onset are the same symptoms that she usually experiences prior having a syncopal episode. On exam patient is neurovascularly intact with no sensory or motor deficits. EKG exhibits normal sinus rhythm. Patient complaining of head and neck pain on arrival. Will obtain CT head and cervical spine without contrast to evaluate for acute changes from fall secondary to loss of consciousness.  Patient without acute findings on head and C-spine CT scan; head and neck cleared. Labs without significant findings. Patient has remained asymptomatic without tachycardia or tachypnea, hemodynamically stable, as well as well and nontoxic appearing. Asymptomatic while ambulating and ambulates without difficulty. Patient stable for d/c with cardiology follow up. Indications for ED return discussed. Patient states comfort and understanding with this d/c plan with no unaddressed concerns. Patient seen also by Dr.  Manus Gunning with whom this work up and management plan was discussed.   Filed Vitals:   07/09/12 2245 07/09/12 2300 07/09/12 2315 07/09/12 2335  BP: 106/55 113/62 115/58 110/61  Pulse: 83 78 78 77  Temp:    98.4 F (36.9 C)  TempSrc:    Oral  Resp: 21 18 17 18   SpO2: 100% 100% 100% 100%        Antony Madura, PA-C 07/10/12 1338

## 2012-07-11 NOTE — ED Provider Notes (Signed)
Medical screening examination/treatment/procedure(s) were performed by non-physician practitioner and as supervising physician I was immediately available for consultation/collaboration.  Beverley Allender, MD 07/11/12 1002 

## 2013-02-07 ENCOUNTER — Emergency Department (HOSPITAL_COMMUNITY): Payer: BC Managed Care – PPO

## 2013-02-07 ENCOUNTER — Emergency Department (HOSPITAL_COMMUNITY)
Admission: EM | Admit: 2013-02-07 | Discharge: 2013-02-07 | Disposition: A | Discharge status: Home / Self Care | Payer: BC Managed Care – PPO | Attending: Emergency Medicine | Admitting: Emergency Medicine

## 2013-02-07 ENCOUNTER — Other Ambulatory Visit: Payer: Self-pay

## 2013-02-07 ENCOUNTER — Encounter (HOSPITAL_COMMUNITY): Payer: Self-pay | Admitting: Emergency Medicine

## 2013-02-07 DIAGNOSIS — R296 Repeated falls: Secondary | ICD-10-CM | POA: Insufficient documentation

## 2013-02-07 DIAGNOSIS — I456 Pre-excitation syndrome: Secondary | ICD-10-CM | POA: Insufficient documentation

## 2013-02-07 DIAGNOSIS — M545 Low back pain, unspecified: Secondary | ICD-10-CM | POA: Insufficient documentation

## 2013-02-07 DIAGNOSIS — Z3202 Encounter for pregnancy test, result negative: Secondary | ICD-10-CM | POA: Insufficient documentation

## 2013-02-07 DIAGNOSIS — M542 Cervicalgia: Secondary | ICD-10-CM | POA: Insufficient documentation

## 2013-02-07 DIAGNOSIS — I2510 Atherosclerotic heart disease of native coronary artery without angina pectoris: Secondary | ICD-10-CM | POA: Insufficient documentation

## 2013-02-07 DIAGNOSIS — Z79899 Other long term (current) drug therapy: Secondary | ICD-10-CM | POA: Insufficient documentation

## 2013-02-07 DIAGNOSIS — Z8679 Personal history of other diseases of the circulatory system: Secondary | ICD-10-CM | POA: Insufficient documentation

## 2013-02-07 DIAGNOSIS — Y9389 Activity, other specified: Secondary | ICD-10-CM | POA: Insufficient documentation

## 2013-02-07 DIAGNOSIS — Y92009 Unspecified place in unspecified non-institutional (private) residence as the place of occurrence of the external cause: Secondary | ICD-10-CM | POA: Insufficient documentation

## 2013-02-07 DIAGNOSIS — R55 Syncope and collapse: Secondary | ICD-10-CM | POA: Insufficient documentation

## 2013-02-07 DIAGNOSIS — R002 Palpitations: Secondary | ICD-10-CM | POA: Insufficient documentation

## 2013-02-07 DIAGNOSIS — R51 Headache: Secondary | ICD-10-CM | POA: Insufficient documentation

## 2013-02-07 DIAGNOSIS — R42 Dizziness and giddiness: Secondary | ICD-10-CM | POA: Insufficient documentation

## 2013-02-07 LAB — CBC WITH DIFFERENTIAL/PLATELET
Basophils Relative: 0 % (ref 0–1)
Eosinophils Absolute: 0 10*3/uL (ref 0.0–0.7)
Eosinophils Relative: 0 % (ref 0–5)
Lymphocytes Relative: 31 % (ref 12–46)
Lymphs Abs: 2.5 10*3/uL (ref 0.7–4.0)
MCH: 28.6 pg (ref 26.0–34.0)
Neutrophils Relative %: 63 % (ref 43–77)
Platelets: 240 10*3/uL (ref 150–400)
RBC: 4.16 MIL/uL (ref 3.87–5.11)
WBC: 8.1 10*3/uL (ref 4.0–10.5)

## 2013-02-07 LAB — POCT I-STAT, CHEM 8
BUN: 5 mg/dL — ABNORMAL LOW (ref 6–23)
Calcium, Ion: 1.28 mmol/L — ABNORMAL HIGH (ref 1.12–1.23)
HCT: 38 % (ref 36.0–46.0)
Sodium: 141 mEq/L (ref 135–145)
TCO2: 25 mmol/L (ref 0–100)

## 2013-02-07 LAB — POCT PREGNANCY, URINE: Preg Test, Ur: NEGATIVE

## 2013-02-07 LAB — POCT I-STAT TROPONIN I: Troponin i, poc: 0 ng/mL (ref 0.00–0.08)

## 2013-02-07 MED ORDER — HYDROCODONE-ACETAMINOPHEN 5-325 MG PO TABS
1.0000 | ORAL_TABLET | Freq: Once | ORAL | Status: AC
  Administered 2013-02-07: 1 via ORAL
  Filled 2013-02-07: qty 1

## 2013-02-07 MED ORDER — HYDROCODONE-ACETAMINOPHEN 5-325 MG PO TABS: 1.0000 | ORAL_TABLET | ORAL | Status: DC | PRN

## 2013-02-07 NOTE — ED Notes (Signed)
Patient in bathroom at college today talking with friends and had syncopal episode and passed out.  Patient states she has history of same and has been diagnosed with cardiac arrythmia.  Patient has been off prescribed meds x 2 months.  Patient c/o lower back pain from fall when she passed out.

## 2013-02-07 NOTE — ED Notes (Signed)
Patient to CT at this time

## 2013-02-07 NOTE — ED Provider Notes (Signed)
CSN: [id number]     Arrival date & time 02/07/13  1258 History   First MD Initiated Contact with Patient 02/07/13 1257     Chief Complaint  Patient presents with  . Fall  . Near Syncope   (Consider location/radiation/quality/duration/timing/severity/associated sxs/prior Treatment) HPI Comments: Patient with PMhx significant for recurrent syncope associated with POTS and WPW for which she is followed at [facility] for presents to the ED s/p syncopal episode while at school.  She reports that she was in the bathroom washing her hands after urinating and speaking with a friend when she went to dry her hands off she began to feel like she was going to pass out.  She states that she remembers nothing after this until she awoke on the floor of the bathroom.  She reports now with headache, neck and lower back pain.  She states that she has known WPW and has been off her medication for about 2 months for this because her insurance changed and they are trying to see if her new insurance will pay for the medication she needs.  She had an ablation at [facility] in [place] for this but is followed at [facility] by Dr. Keith Rake..  She reports her last syncopal episode was in May of this year.  She currently denies chest pain or shortness of breath but does admit to some residual dizziness.  Patient is a 22 y.o. female presenting with syncope. The history is provided by the patient. No language interpreter was used.  Loss of Consciousness Episode history:  Single Most recent episode:  Today Timing:  Intermittent Progression:  Worsening Chronicity:  Chronic Context: standing up and urination   Witnessed: yes   Relieved by:  Nothing Worsened by:  Nothing tried Ineffective treatments:  Medication Associated symptoms: dizziness and palpitations   Associated symptoms: no anxiety, no chest pain, no diaphoresis, no difficulty breathing, no focal sensory loss, no focal weakness, no headaches, no nausea, no recent fall, no  recent injury, no recent surgery, no seizures, no shortness of breath, no vomiting and no weakness   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 Coronary artery disease   . WPW (Wolff-Parkinson-White syndrome)   . Syncope   . Vasovagal syncope   . POTS (postural orthostatic tachycardia syndrome)    Past Surgical History  Procedure Laterality Date  . Cardiac electrophysiology study and ablation     No family history on file. History  Substance Use Topics  . Smoking status: Never Smoker   . Smokeless tobacco: Not on file  . Alcohol Use: No   OB History   Grav Para Term Preterm Abortions TAB SAB Ect Mult Living                 Review of Systems  Constitutional: Negative for diaphoresis.  Respiratory: Negative for shortness of breath.   Cardiovascular: Positive for palpitations and syncope. Negative for chest pain.  Gastrointestinal: Negative for nausea and vomiting.  Neurological: Positive for dizziness and syncope. Negative for focal weakness, seizures, weakness and headaches.  All other systems reviewed and are negative.    Allergies  Review of patient's allergies indicates no known allergies.  Home Medications   Current Outpatient Rx  Name  Route  Sig  Dispense  Refill  . Aspirin-Salicylamide-Caffeine (BC HEADACHE PO)   Oral   Take 1 packet by mouth daily as needed (for headache).  BP 124/85  Temp(Src) 99 F (37.2 C) (Oral)  Resp 18  Ht 5\' 3"  (1.6 m)  Wt 145 lb (65.772 kg)  BMI 25.69 kg/m2  SpO2 100%  LMP 01/24/2013 Physical Exam  Nursing note and vitals reviewed. Constitutional: She is oriented to person, place, and time. She appears well-developed and well-nourished. No distress.  HENT:  Head: Normocephalic and atraumatic.  Right Ear: External ear normal.  Left Ear: External ear normal.  Nose: Nose normal.  Mouth/Throat: Oropharynx is clear and moist. No oropharyngeal exudate.  Eyes: Conjunctivae  are normal. Pupils are equal, round, and reactive to light. No scleral icterus.  Neck: Neck supple. Spinous process tenderness and muscular tenderness present.    Cardiovascular: Normal rate, regular rhythm and normal heart sounds.  Exam reveals no gallop and no friction rub.   No murmur heard. Pulmonary/Chest: Effort normal and breath sounds normal. No respiratory distress. She has no wheezes. She has no rales. She exhibits no tenderness.  Abdominal: Soft. Bowel sounds are normal. She exhibits no distension. There is no tenderness. There is no rebound and no guarding.  Musculoskeletal:       Lumbar back: She exhibits tenderness and bony tenderness.       Back:  Neurological: She is alert and oriented to person, place, and time. No cranial nerve deficit. She exhibits normal muscle tone. Coordination normal.  Skin: Skin is warm and dry. No rash noted. No erythema. No pallor.  Psychiatric: She has a normal mood and affect. Her behavior is normal. Judgment and thought content normal.    ED Course  Procedures (including critical care time) Labs Review Labs Reviewed  POCT I-STAT, CHEM 8 - Abnormal; Notable for the following:    BUN 5 (*)    Calcium, Ion 1.28 (*)    All other components within normal limits  CBC WITH DIFFERENTIAL  URINALYSIS, ROUTINE W REFLEX MICROSCOPIC  POCT PREGNANCY, URINE  POCT I-STAT TROPONIN I   Imaging Review Dg Chest 2 View  02/07/2013   CLINICAL DATA:  Syncopal episode.  EXAM: CHEST  2 VIEW  COMPARISON:  11/29/2011.  FINDINGS: The heart size and mediastinal contours are within normal limits. Both lungs are clear. The visualized skeletal structures are unremarkable.  IMPRESSION: No active cardiopulmonary disease.   Electronically Signed   By: Salome Holmes M.D.   On: 02/07/2013 14:36   Dg Lumbar Spine Complete  02/07/2013   CLINICAL DATA:  Lower back pain  EXAM: LUMBAR SPINE - COMPLETE 4+ VIEW  COMPARISON:  Abdomen 06/02/2008  FINDINGS: There is no evidence of  lumbar spine fracture. Alignment is normal. Intervertebral disc spaces are maintained.  IMPRESSION: Negative.   Electronically Signed   By: Salome Holmes M.D.   On: 02/07/2013 14:37   Ct Head Wo Contrast  02/07/2013   CLINICAL DATA:  Syncope, head injury  EXAM: CT HEAD WITHOUT CONTRAST  CT CERVICAL SPINE WITHOUT CONTRAST  TECHNIQUE: Multidetector CT imaging of the head and cervical spine was performed following the standard protocol without intravenous contrast. Multiplanar CT image reconstructions of the cervical spine were also generated.  COMPARISON:  07/09/2012  FINDINGS: CT HEAD FINDINGS  No skull fracture is noted. Paranasal sinuses and mastoid air cells are unremarkable. No intracranial hemorrhage, mass effect or midline shift. No hydrocephalus. No acute infarction. No mass lesion is noted on this unenhanced scan. The gray and white-matter differentiation is preserved.  CT CERVICAL SPINE FINDINGS  Axial images of the cervical spine shows no acute fracture or  subluxation. Computer processed images shows no acute fracture or subluxation. Alignment, disk spaces and vertebral heights are preserved. No prevertebral soft tissue swelling. Cervical airway is patent. There is no pneumothorax in visualized lung apices.  IMPRESSION: 1. No acute intracranial abnormality. 2. No cervical spine acute fracture or subluxation. Stable examination.   Electronically Signed   By: Natasha Mead M.D.   On: 02/07/2013 14:18   Ct Cervical Spine Wo Contrast  02/07/2013   CLINICAL DATA:  Syncope, head injury  EXAM: CT HEAD WITHOUT CONTRAST  CT CERVICAL SPINE WITHOUT CONTRAST  TECHNIQUE: Multidetector CT imaging of the head and cervical spine was performed following the standard protocol without intravenous contrast. Multiplanar CT image reconstructions of the cervical spine were also generated.  COMPARISON:  07/09/2012  FINDINGS: CT HEAD FINDINGS  No skull fracture is noted. Paranasal sinuses and mastoid air cells are unremarkable.  No intracranial hemorrhage, mass effect or midline shift. No hydrocephalus. No acute infarction. No mass lesion is noted on this unenhanced scan. The gray and white-matter differentiation is preserved.  CT CERVICAL SPINE FINDINGS  Axial images of the cervical spine shows no acute fracture or subluxation. Computer processed images shows no acute fracture or subluxation. Alignment, disk spaces and vertebral heights are preserved. No prevertebral soft tissue swelling. Cervical airway is patent. There is no pneumothorax in visualized lung apices.  IMPRESSION: 1. No acute intracranial abnormality. 2. No cervical spine acute fracture or subluxation. Stable examination.   Electronically Signed   By: Natasha Mead M.D.   On: 02/07/2013 14:18    EKG Interpretation     Ventricular Rate:  91 PR Interval:  117 QRS Duration: 84 QT Interval:  343 QTC Calculation: 422 R Axis:   77 Text Interpretation:  Sinus rhythm Borderline short PR interval in pt with know WPW. Unchanged from prior.             MDM  Syncope  Patient with history of WPW and POTS presents with syncopal episode similar to previous.  She reports last one was in May of this year, is closely followed by EP Dr. Erenest Rasher at [facility].  Spoke with Dr. Arvilla Market' colleague to states that since the patient is currently stable (she has no hypotension, no tachycardia, no dysrhythmias noted on monitor for the past 4 hours) we can discharge the patient.  She has followed up with Dr. Arvilla Market at [facility] next week and will keep this appointment.  She will also not be driving until she can follow up.  She has been given strict return precautions as well.  No evidence of injury based on physical examination or x-ray findings.  Izola Price Marisue Humble, PA-C 02/07/13 1654

## 2013-02-07 NOTE — ED Provider Notes (Signed)
Medical screening examination/treatment/procedure(s) were performed by non-physician practitioner and as supervising physician I was immediately available for consultation/collaboration.  EKG Interpretation     Ventricular Rate:  91 PR Interval:  117 QRS Duration: 84 QT Interval:  343 QTC Calculation: 422 R Axis:   77 Text Interpretation:  Sinus rhythm Borderline short PR interval in pt with know WPW. Unchanged from prior.               Shanna Cisco, MD 02/07/13 [phone]

## 2013-04-30 ENCOUNTER — Emergency Department (HOSPITAL_COMMUNITY)
Admission: EM | Admit: 2013-04-30 | Discharge: 2013-05-01 | Disposition: A | Discharge status: Home / Self Care | Payer: BC Managed Care – PPO | Attending: Emergency Medicine | Admitting: Emergency Medicine

## 2013-04-30 ENCOUNTER — Encounter (HOSPITAL_COMMUNITY): Payer: Self-pay | Admitting: Emergency Medicine

## 2013-04-30 DIAGNOSIS — I2510 Atherosclerotic heart disease of native coronary artery without angina pectoris: Secondary | ICD-10-CM | POA: Insufficient documentation

## 2013-04-30 DIAGNOSIS — S1093XA Contusion of unspecified part of neck, initial encounter: Secondary | ICD-10-CM

## 2013-04-30 DIAGNOSIS — R55 Syncope and collapse: Secondary | ICD-10-CM

## 2013-04-30 DIAGNOSIS — S0083XA Contusion of other part of head, initial encounter: Secondary | ICD-10-CM

## 2013-04-30 DIAGNOSIS — S199XXA Unspecified injury of neck, initial encounter: Secondary | ICD-10-CM

## 2013-04-30 DIAGNOSIS — Z9889 Other specified postprocedural states: Secondary | ICD-10-CM | POA: Insufficient documentation

## 2013-04-30 DIAGNOSIS — S0993XA Unspecified injury of face, initial encounter: Secondary | ICD-10-CM | POA: Insufficient documentation

## 2013-04-30 DIAGNOSIS — M542 Cervicalgia: Secondary | ICD-10-CM

## 2013-04-30 DIAGNOSIS — S0093XA Contusion of unspecified part of head, initial encounter: Secondary | ICD-10-CM

## 2013-04-30 DIAGNOSIS — Y9229 Other specified public building as the place of occurrence of the external cause: Secondary | ICD-10-CM | POA: Insufficient documentation

## 2013-04-30 DIAGNOSIS — S0003XA Contusion of scalp, initial encounter: Secondary | ICD-10-CM | POA: Insufficient documentation

## 2013-04-30 DIAGNOSIS — Y9389 Activity, other specified: Secondary | ICD-10-CM | POA: Insufficient documentation

## 2013-04-30 DIAGNOSIS — W1809XA Striking against other object with subsequent fall, initial encounter: Secondary | ICD-10-CM | POA: Insufficient documentation

## 2013-04-30 MED ORDER — OXYCODONE-ACETAMINOPHEN 5-325 MG PO TABS
1.0000 | ORAL_TABLET | Freq: Once | ORAL | Status: AC
  Administered 2013-04-30: 1 via ORAL
  Filled 2013-04-30: qty 1

## 2013-04-30 NOTE — ED Notes (Signed)
EMS-pt is here for syncopal episode, hx of POTS. Pt reports hitting her head and cervical pain (pt placed in ccollar by EMS). Initial HR by EMS 120s. BP WNL and HR currently in the 90s. 20g(L)AC.

## 2013-05-01 ENCOUNTER — Emergency Department (HOSPITAL_COMMUNITY): Payer: BC Managed Care – PPO

## 2013-05-01 LAB — BASIC METABOLIC PANEL
BUN: 10 mg/dL (ref 6–23)
CO2: 26 meq/L (ref 19–32)
Calcium: 9.4 mg/dL (ref 8.4–10.5)
Chloride: 103 mEq/L (ref 96–112)
Creatinine, Ser: 0.79 mg/dL (ref 0.50–1.10)
GFR calc Af Amer: >90 mL/min (ref >90)
GFR calc non Af Amer: >90 mL/min (ref >90)
Glucose, Bld: 99 mg/dL (ref 70–99)
POTASSIUM: 4.5 meq/L (ref 3.7–5.3)
SODIUM: 140 meq/L (ref 137–147)

## 2013-05-01 LAB — POCT I-STAT TROPONIN I: TROPONIN I, POC: 0 ng/mL (ref 0.00–0.08)

## 2013-05-01 MED ORDER — ONDANSETRON 4 MG PO TBDP
8.0000 mg | ORAL_TABLET | Freq: Once | ORAL | Status: AC
  Administered 2013-05-01: 8 mg via ORAL
  Filled 2013-05-01: qty 2

## 2013-05-01 NOTE — ED Provider Notes (Addendum)
CSN: [id number]     Arrival date & time 04/30/13  2204 History   First MD Initiated Contact with Patient 05/01/13 0036     Chief Complaint  Patient presents with  . Loss of Consciousness   (Consider location/radiation/quality/duration/timing/severity/associated sxs/prior Treatment) Patient is a 23 y.o. female presenting with syncope. The history is provided by the patient.  Loss of Consciousness She had an episode of syncope at about 8:30 PM. She states that she was at church and was standing and she felt like her heart was racing. There was a tight feeling in her chest and down she passed out. She hit her head when she passed out and she is complaining of pain in her head and neck. Pain is severe and she rates it at 9/10. There was nausea with but no vomiting. She denies dyspnea denies diaphoresis. 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 This episode is typical of the episodes she has had in the past. EMS did note an initial heart rate of 120.  Past Medical History  Diagnosis Date  . Coronary artery disease   . WPW (Wolff-Parkinson-White syndrome)   . Syncope   . Vasovagal syncope   . POTS (postural orthostatic tachycardia syndrome)    Past Surgical History  Procedure Laterality Date  . Cardiac electrophysiology study and ablation     No family history on file. History  Substance Use Topics  . Smoking status: Never Smoker   . Smokeless tobacco: Not on file  . Alcohol Use: No   OB History   Grav Para Term Preterm Abortions TAB SAB Ect Mult Living                 Review of Systems  Cardiovascular: Positive for syncope.  All other systems reviewed and are negative.    Allergies  Review of patient's allergies indicates no known allergies.  Home Medications  No current outpatient prescriptions on file. BP  120/69  Pulse 95  Temp(Src) 99.3 F (37.4 C) (Oral)  Resp 16  SpO2 100% Physical Exam  Nursing note and vitals reviewed.  23 year old female, resting comfortably and in no acute distress. Vital signs are normal. Oxygen saturation is 100%, which is normal. Head is normocephalic and atraumatic. PERRLA, EOMI. Oropharynx is clear. Fundi show no hemorrhage, exudate, or papilledema. Neck is immobilized in a stiff cervical collar. There is mild midline tenderness posteriorly. There is no adenopathy or JVD. Back is nontender and there is no CVA tenderness. Lungs are clear without rales, wheezes, or rhonchi. Chest is nontender. Heart has regular rate and rhythm without murmur. Abdomen is soft, flat, nontender without masses or hepatosplenomegaly and peristalsis is normoactive. Extremities have no cyanosis or edema, full range of motion is present. Skin is warm and dry without rash. Neurologic: Mental status is normal, cranial nerves are intact, there are no motor or sensory deficits.  ED Course  Procedures (including critical care time) Labs Review Results for orders placed during the hospital encounter of 04/30/13  BASIC METABOLIC PANEL      Result Value Range   Sodium 140  137 - 147 mEq/L   Potassium 4.5  3.7 - 5.3 mEq/L   Chloride 103  96 - 112 mEq/L   CO2 26  19 - 32 mEq/L   Glucose, Bld 99  70 - 99 mg/dL   BUN 10  6 - 23 mg/dL   Creatinine, Ser 4.090.79  0.50 - 1.10 mg/dL   Calcium 9.4  8.4 - 81.110.5 mg/dL   GFR calc non Af Amer >90  >90 mL/min   GFR calc Af Amer >90  >90 mL/min  POCT I-STAT TROPONIN I      Result Value Range   Troponin i, poc 0.00  0.00 - 0.08 ng/mL   Comment 3            Imaging Review Ct Head Wo Contrast  05/01/2013   CLINICAL DATA:  Syncopal episode, headache, neck pain  EXAM: CT HEAD WITHOUT CONTRAST  CT CERVICAL SPINE WITHOUT CONTRAST  TECHNIQUE: Multidetector CT imaging of the head and cervical spine was performed following the standard protocol without  intravenous contrast. Multiplanar CT image reconstructions of the cervical spine were also generated.  COMPARISON:  02/07/2013  FINDINGS: CT HEAD FINDINGS  No acute intra hemorrhage, mass lesion, infarction, midline shift, herniation, hydrocephalus. No extra-axial fluid collection. Normal gray-white matter differentiation. Cisterns patent. No cerebellar abnormality. Mastoids and sinuses clear. Stable exam.  CT CERVICAL SPINE FINDINGS  Normal alignment. Negative for fracture. No subluxation dislocation. Preserved vertebral body heights and disc spaces. Normal prevertebral soft tissues. Intact odontoid.  IMPRESSION: No acute intracranial finding.  No acute cervical spine fracture or osseous abnormality.   Electronically Signed   By: Ruel Favorsrevor  Shick M.D.   On: 05/01/2013 01:47   Ct Cervical Spine Wo Contrast  05/01/2013   CLINICAL DATA:  Syncopal episode, headache, neck pain  EXAM: CT HEAD WITHOUT CONTRAST  CT CERVICAL SPINE WITHOUT CONTRAST  TECHNIQUE: Multidetector CT imaging of the head and cervical spine was performed following the standard protocol without intravenous contrast. Multiplanar CT image reconstructions of the cervical spine were also generated.  COMPARISON:  02/07/2013  FINDINGS: CT HEAD FINDINGS  No acute intra hemorrhage, mass lesion, infarction, midline shift, herniation, hydrocephalus. No extra-axial fluid collection. Normal gray-white matter differentiation. Cisterns patent. No cerebellar abnormality. Mastoids and sinuses clear. Stable exam.  CT CERVICAL SPINE FINDINGS  Normal alignment. Negative for fracture. No subluxation dislocation. Preserved vertebral body heights and disc spaces. Normal prevertebral soft tissues. Intact odontoid.  IMPRESSION: No acute intracranial finding.  No acute cervical spine fracture or osseous abnormality.   Electronically Signed   By: Ruel Favorsrevor  Shick M.D.   On: 05/01/2013 01:47    EKG Interpretation    Date/Time:  Wednesday May 01 2013 01:07:35  EST Ventricular Rate:  94 PR Interval:  125 QRS Duration: 75 QT Interval:  346 QTC Calculation: 433 R Axis:   71 Text Interpretation:  Sinus rhythm Normal ECG When compared with ECG of 02/07/2013, No significant change was found Confirmed by [facility]  MD, Danis Pembleton ([id number]) on 05/01/2013 1:14:08 AM            MDM   1. Syncope   2. Head contusion   3. Neck pain    Syncope with minor head and neck injury. Because of hitting her head and ongoing headache and neck pain, she will be sent for CT scan to evaluate this. Likely to be checked. Old records are reviewed and she has 9 prior ED visits for her syncopal episodes. ECG and troponin will also be checked.  Laboratory workup in imaging are unremarkable. She is discharged. She is to [facility].  Dione Boozeavid Zorianna Taliaferro, MD 05/01/13 [phone]  Dione Boozeavid Avya Flavell, MD 05/01/13 [phone]

## 2013-05-01 NOTE — Discharge Instructions (Signed)
Syncope  Syncope is a fainting spell. This means the person loses consciousness and drops to the ground. The person is generally unconscious for less than 5 minutes. The person may have some muscle twitches for up to 15 seconds before waking up and returning to normal. Syncope occurs more often in elderly people, but it can happen to anyone. While most causes of syncope are not dangerous, syncope can be a sign of a serious medical problem. It is important to seek medical care.   CAUSES   Syncope is caused by a sudden decrease in blood flow to the brain. The specific cause is often not determined. Factors that can trigger syncope include:   Taking medicines that lower blood pressure.   Sudden changes in posture, such as standing up suddenly.   Taking more medicine than prescribed.   Standing in one place for too long.   Seizure disorders.   Dehydration and excessive exposure to heat.   Low blood sugar (hypoglycemia).   Straining to have a bowel movement.   Heart disease, irregular heartbeat, or other circulatory problems.   Fear, emotional distress, seeing blood, or severe pain.  SYMPTOMS   Right before fainting, you may:   Feel dizzy or lightheaded.   Feel nauseous.   See all white or all black in your field of vision.   Have cold, clammy skin.  DIAGNOSIS   Your caregiver will ask about your symptoms, perform a physical exam, and perform electrocardiography (ECG) to record the electrical activity of your heart. Your caregiver may also perform other heart or blood tests to determine the cause of your syncope.  TREATMENT   In most cases, no treatment is needed. Depending on the cause of your syncope, your caregiver may recommend changing or stopping some of your medicines.  HOME CARE INSTRUCTIONS   Have someone stay with you until you feel stable.   Do not drive, operate machinery, or play sports until your caregiver says it is okay.   Keep all follow-up appointments as directed by your  caregiver.   Lie down right away if you start feeling like you might faint. Breathe deeply and steadily. Wait until all the symptoms have passed.   Drink enough fluids to keep your urine clear or pale yellow.   If you are taking blood pressure or heart medicine, get up slowly, taking several minutes to sit and then stand. This can reduce dizziness.  SEEK IMMEDIATE MEDICAL CARE IF:    You have a severe headache.   You have unusual pain in the chest, abdomen, or back.   You are bleeding from the mouth or rectum, or you have black or tarry stool.   You have an irregular or very fast heartbeat.   You have pain with breathing.   You have repeated fainting or seizure-like jerking during an episode.   You faint when sitting or lying down.   You have confusion.   You have difficulty walking.   You have severe weakness.   You have vision problems.  If you fainted, call your local emergency services (911 in [place] Do not drive yourself to the hospital.   MAKE SURE YOU:   Understand these instructions.   Will watch your condition.   Will get help right away if you are not doing well or get worse.  Document Released: 03/21/2005 Document Revised: 09/20/2011 Document Reviewed: 05/20/2011  Exit[employer] Patient Information 2014 [employer].

## 2013-05-01 NOTE — ED Notes (Signed)
MD at bedside. 

## 2013-07-17 ENCOUNTER — Emergency Department (HOSPITAL_COMMUNITY)
Admission: EM | Admit: 2013-07-17 | Discharge: 2013-07-17 | Disposition: A | Discharge status: Home / Self Care | Payer: BC Managed Care – PPO | Attending: Emergency Medicine | Admitting: Emergency Medicine

## 2013-07-17 ENCOUNTER — Emergency Department (HOSPITAL_COMMUNITY): Payer: BC Managed Care – PPO

## 2013-07-17 ENCOUNTER — Encounter (HOSPITAL_COMMUNITY): Payer: Self-pay | Admitting: Emergency Medicine

## 2013-07-17 DIAGNOSIS — W1809XA Striking against other object with subsequent fall, initial encounter: Secondary | ICD-10-CM | POA: Insufficient documentation

## 2013-07-17 DIAGNOSIS — Y9389 Activity, other specified: Secondary | ICD-10-CM | POA: Insufficient documentation

## 2013-07-17 DIAGNOSIS — S46909A Unspecified injury of unspecified muscle, fascia and tendon at shoulder and upper arm level, unspecified arm, initial encounter: Secondary | ICD-10-CM | POA: Insufficient documentation

## 2013-07-17 DIAGNOSIS — I2510 Atherosclerotic heart disease of native coronary artery without angina pectoris: Secondary | ICD-10-CM | POA: Insufficient documentation

## 2013-07-17 DIAGNOSIS — M542 Cervicalgia: Secondary | ICD-10-CM

## 2013-07-17 DIAGNOSIS — R55 Syncope and collapse: Secondary | ICD-10-CM | POA: Insufficient documentation

## 2013-07-17 DIAGNOSIS — S298XXA Other specified injuries of thorax, initial encounter: Secondary | ICD-10-CM | POA: Insufficient documentation

## 2013-07-17 DIAGNOSIS — S0993XA Unspecified injury of face, initial encounter: Secondary | ICD-10-CM | POA: Insufficient documentation

## 2013-07-17 DIAGNOSIS — R0789 Other chest pain: Secondary | ICD-10-CM

## 2013-07-17 DIAGNOSIS — S0990XA Unspecified injury of head, initial encounter: Secondary | ICD-10-CM | POA: Insufficient documentation

## 2013-07-17 DIAGNOSIS — S4980XA Other specified injuries of shoulder and upper arm, unspecified arm, initial encounter: Secondary | ICD-10-CM | POA: Insufficient documentation

## 2013-07-17 DIAGNOSIS — Z3202 Encounter for pregnancy test, result negative: Secondary | ICD-10-CM | POA: Insufficient documentation

## 2013-07-17 DIAGNOSIS — R002 Palpitations: Secondary | ICD-10-CM | POA: Insufficient documentation

## 2013-07-17 DIAGNOSIS — S199XXA Unspecified injury of neck, initial encounter: Secondary | ICD-10-CM

## 2013-07-17 DIAGNOSIS — Y9210 Unspecified residential institution as the place of occurrence of the external cause: Secondary | ICD-10-CM | POA: Insufficient documentation

## 2013-07-17 LAB — PREGNANCY, URINE: Preg Test, Ur: NEGATIVE

## 2013-07-17 LAB — CBC WITH DIFFERENTIAL/PLATELET
Basophils Absolute: 0 10*3/uL (ref 0.0–0.1)
Basophils Relative: 0 % (ref 0–1)
EOS ABS: 0.1 10*3/uL (ref 0.0–0.7)
Eosinophils Relative: 1 % (ref 0–5)
HCT: 35.6 % — ABNORMAL LOW (ref 36.0–46.0)
Hemoglobin: 11.4 g/dL — ABNORMAL LOW (ref 12.0–15.0)
LYMPHS ABS: 3 10*3/uL (ref 0.7–4.0)
Lymphocytes Relative: 46 % (ref 12–46)
MCH: 28.1 pg (ref 26.0–34.0)
MCHC: 32 g/dL (ref 30.0–36.0)
MCV: 87.7 fL (ref 78.0–100.0)
Monocytes Absolute: 0.4 10*3/uL (ref 0.1–1.0)
Monocytes Relative: 6 % (ref 3–12)
NEUTROS PCT: 47 % (ref 43–77)
Neutro Abs: 3.1 10*3/uL (ref 1.7–7.7)
Platelets: 271 10*3/uL (ref 150–400)
RBC: 4.06 MIL/uL (ref 3.87–5.11)
RDW: 12 % (ref 11.5–15.5)
WBC: 6.6 10*3/uL (ref 4.0–10.5)

## 2013-07-17 LAB — COMPREHENSIVE METABOLIC PANEL
ALBUMIN: 4.4 g/dL (ref 3.5–5.2)
ALK PHOS: 54 U/L (ref 39–117)
ALT: 7 U/L (ref 0–35)
AST: 32 U/L (ref 0–37)
BUN: 8 mg/dL (ref 6–23)
CALCIUM: 9.4 mg/dL (ref 8.4–10.5)
CO2: 23 mEq/L (ref 19–32)
Chloride: 102 mEq/L (ref 96–112)
Creatinine, Ser: 0.71 mg/dL (ref 0.50–1.10)
GFR calc non Af Amer: >90 mL/min (ref >90)
Glucose, Bld: 76 mg/dL (ref 70–99)
Potassium: 5 mEq/L (ref 3.7–5.3)
SODIUM: 140 meq/L (ref 137–147)
TOTAL PROTEIN: 7.6 g/dL (ref 6.0–8.3)
Total Bilirubin: 0.2 mg/dL — ABNORMAL LOW (ref 0.3–1.2)

## 2013-07-17 LAB — URINALYSIS, ROUTINE W REFLEX MICROSCOPIC
Bilirubin Urine: NEGATIVE
GLUCOSE, UA: NEGATIVE mg/dL
Hgb urine dipstick: NEGATIVE
KETONES UR: NEGATIVE mg/dL
NITRITE: NEGATIVE
PH: 6.5 (ref 5.0–8.0)
Protein, ur: NEGATIVE mg/dL
SPECIFIC GRAVITY, URINE: 1.005 (ref 1.005–1.030)
Urobilinogen, UA: 0.2 mg/dL (ref 0.0–1.0)

## 2013-07-17 LAB — URINE MICROSCOPIC-ADD ON

## 2013-07-17 LAB — TROPONIN I

## 2013-07-17 MED ORDER — ACETAMINOPHEN 325 MG PO TABS
650.0000 mg | ORAL_TABLET | Freq: Once | ORAL | Status: AC
  Administered 2013-07-17: 650 mg via ORAL
  Filled 2013-07-17: qty 2

## 2013-07-17 MED ORDER — KETOROLAC TROMETHAMINE 30 MG/ML IJ SOLN
30.0000 mg | Freq: Once | INTRAMUSCULAR | Status: AC
  Administered 2013-07-17: 30 mg via INTRAVENOUS
  Filled 2013-07-17: qty 1

## 2013-07-17 MED ORDER — SODIUM CHLORIDE 0.9 % IV BOLUS (SEPSIS)
1000.0000 mL | Freq: Once | INTRAVENOUS | Status: AC
  Administered 2013-07-17: 1000 mL via INTRAVENOUS

## 2013-07-17 NOTE — Code Documentation (Signed)
  Patient Name: Desiree Gutierrez   MRN: [id number]   Date of Birth/ Sex: 08/15/1990 , female      Admission Date: (Not on file)  Attending Provider: No att. providers found  Primary Diagnosis: S/p fall  NO ACTUAL CODE, PATIENT FELL AFTER STANDING. NO LOSS OF PULSE NOTED. ?SYNCOPE Indication: Code blue was called this afternoon on 4[facility], however on my arrival, patient was lying on the floor on her back speaking full sentences. Patient apparently was visiting her mother who is a patient on 4[facility], and stood up, felt dizzy, racing heart rate, then feel face down on the floor. She does not recall hitting the ground but remembers falling. She reports prior episodes of syncope. Reportedly [facility] Patient's neck was immobilized, able to wiggle her toes and fingers, sensation intact. Complaining of headache and neck pain. Loaded on stretcher after c-collar placed and transported to the emergency room, E44.  Spoke with Dr. Romeo AppleHarrison in ED who will resume care.   -RN to call family.     Darden PalmerSamaya Kacin Dancy, MD  07/17/2013, 2:24 PM

## 2013-07-17 NOTE — Code Documentation (Signed)
See MD note regarding "Code Blue" call.  Desiree Gutierrez was visiting her mother.  Staff had just exited the room with the mother when they heard her fall.  Upon their arrival Desiree Gutierrez was lying face down on the floor, they rolled her to her back.  She was initially unresponsive but was breathing and had a pulse.  Upon arrival of the Code team she was alert and communicating with staff.  She describes neck pain and a frontal head ache, moves all four extremities.   IV started, labs drawn.  Maintained on floor with neck immobilization until back board with c spine stabilization arrived.  Transported to ED via stretcher.  Patient was SR on the monitor. She asked that we call her godfather and older brother.  I spoke with her godfather.  A message was left for her brother to call the hospital.

## 2013-07-17 NOTE — ED Notes (Signed)
Gave report to CenterPoint EnergyJanelle RN..notiified that ekg not done b/c pt off unit.

## 2013-07-17 NOTE — ED Notes (Signed)
Pt was on one of the hospital units visiting her mother. When pt stood up she said she started to feel dizzy and felt her heart racing. Pt does not remember passing out. Transport nurses stated pt was found on the floor face down. Pt currently alert and oriented. Pt exp headache and neck pain 9/10 described as aching. Pt exp a little nausea. Pt denies any other s/s of distress.

## 2013-07-17 NOTE — ED Provider Notes (Signed)
CSN: [id number]     Arrival date & time 07/17/13  1426 History   First MD Initiated Contact with Patient 07/17/13 1427     No chief complaint on file.  HPI  Desiree Gutierrez is a 23 y.o. female with a PMH of WPW, vasovagal syncope, POTS, and CAD who presents to the ED for evaluation of syncope. History was provided by the patient. Patient is currently visiting her mother who is in the hospital. Patient states that prior to arrival in ED she was standing up to leave the hospital when she suddenly felt dizzy, lightheaded, and had palpitations. Patient had a syncopal episode falling forward hitting her head. Unsure how long patient was unconscious. Patient was found on the floor face down. Patient complains of a frontal headache and neck pain. Patient also complains of some nausea and photophobia. No vision changes or emesis. Patient has a history of WPW and POTS. Denies any anticoagulation. Has a physician is Dr. Alfonso EllisBeaty. Patient currently denies any dizziness or lightheadedness. She denied anything for pain prior to arrival. Her neck is described as an aching pain in her head as a throbbing pain. Patient denies any chest pain, shortness of breath, abdominal pain, vomiting, extremity pain, numbness, tingling, weakness, confusion, loss of bowel or bladder function, back pain. She states she is otherwise been well with no fevers, chills, change in appetite or activity.   Past Medical History  Diagnosis Date  . Coronary artery disease   . WPW (Wolff-Parkinson-White syndrome)   . Syncope   . Vasovagal syncope   . POTS (postural orthostatic tachycardia syndrome)    Past Surgical History  Procedure Laterality Date  . Cardiac electrophysiology study and ablation     No family history on file. History  Substance Use Topics  . Smoking status: Never Smoker   . Smokeless tobacco: Not on file  . Alcohol Use: No   OB History   Grav Para Term Preterm Abortions TAB SAB Ect Mult Living                   Review of Systems  Constitutional: Negative for fever, chills, diaphoresis, activity change, appetite change and fatigue.  HENT: Negative for congestion, ear pain, rhinorrhea and sore throat.   Eyes: Positive for photophobia. Negative for pain and visual disturbance.  Respiratory: Negative for cough and shortness of breath.   Cardiovascular: Positive for palpitations. Negative for chest pain and leg swelling.  Gastrointestinal: Positive for nausea. Negative for vomiting, abdominal pain, diarrhea and constipation.  Genitourinary: Negative for dysuria.  Musculoskeletal: Positive for neck pain. Negative for back pain and myalgias.  Skin: Negative for wound.  Neurological: Positive for dizziness, syncope, light-headedness and headaches. Negative for weakness and numbness.  Psychiatric/Behavioral: Negative for confusion.    Allergies  Amitiza  Home Medications   Prior to Admission medications   Not on File   BP 117/79  Pulse 82  Ht 5\' 3"  (1.6 m)  Wt 142 lb (64.411 kg)  BMI 25.16 kg/m2  SpO2 100%  LMP 07/09/2013  Filed Vitals:   07/17/13 1437 07/17/13 1540 07/17/13 1542 07/17/13 1545  BP: 117/79 109/66 124/79 121/85  Pulse: 82 76 78 83  Height: 5\' 3"  (1.6 m)     Weight: 142 lb (64.411 kg)     SpO2: 100%       Physical Exam  Nursing note and vitals reviewed. Constitutional: She is oriented to person, place, and time. She appears well-developed and well-nourished. No distress.  HENT:  Head: Normocephalic and atraumatic.    Right Ear: External ear normal.  Left Ear: External ear normal.  Nose: Nose normal.  Mouth/Throat: Oropharynx is clear and moist. No oropharyngeal exudate.  Tenderness to palpation to the frontal scalp with no hematoma, laceration, ecchymosis, erythema or edema. No palpable hematoma, step-offs, or lacerations throughout. Tympanic membranes gray and translucent bilaterally.    Eyes: Conjunctivae and EOM are normal. Pupils are equal, round, and  reactive to light. Right eye exhibits no discharge. Left eye exhibits no discharge.  Neck: Normal range of motion. Neck supple.  Diffuse tenderness to palpation to the posterior cervical spinal and paraspinal muscles throughout.   Cardiovascular: Normal rate, regular rhythm, normal heart sounds and intact distal pulses.  Exam reveals no gallop and no friction rub.   No murmur heard. Dorsalis pedis pulses present and equal bilaterally  Pulmonary/Chest: Effort normal and breath sounds normal. No respiratory distress. She has no wheezes. She has no rales. She exhibits no tenderness.  Abdominal: Soft. She exhibits no distension and no mass. There is no tenderness. There is no rebound and no guarding.  Musculoskeletal: Normal range of motion. She exhibits no edema and no tenderness.  No tenderness to palpation to the UE or LE throughout. Strength 5/5 in the upper and lower extremities bilaterally.    Neurological: She is alert and oriented to person, place, and time.  GCS 15.  No focal neurological deficits.  CN 2-12 intact.  No pronator drift.  Finger to nose intact.  Heel to shin intact.    Skin: Skin is warm and dry. She is not diaphoretic.  No lacerations, erythema, edema, or ecchymosis throughout    ED Course  Procedures (including critical care time) Labs Review Labs Reviewed - No data to display  Imaging Review Dg Chest 2 View  07/17/2013   CLINICAL DATA:  Syncopal episode.  EXAM: CHEST  2 VIEW  COMPARISON:  02/07/2013.  FINDINGS: The heart size and mediastinal contours are within normal limits. Both lungs are clear. The visualized skeletal structures are unremarkable.  IMPRESSION: No active cardiopulmonary disease.   Electronically Signed   By: Loralie Champagne M.D.   On: 07/17/2013 18:53   Ct Cervical Spine Wo Contrast  07/17/2013   CLINICAL DATA:  Syncope with trauma and pain  EXAM: CT HEAD WITHOUT CONTRAST  CT CERVICAL SPINE WITHOUT CONTRAST  TECHNIQUE: Multidetector CT imaging of the  head and cervical spine was performed following the standard protocol without intravenous contrast. Multiplanar CT image reconstructions of the cervical spine were also generated.  COMPARISON:  May 01, 2013  FINDINGS: CT HEAD FINDINGS  The ventricles are normal in size and configuration. There is no mass, hemorrhage, extra-axial fluid collection, or midline shift. Gray-white compartments appear normal. Bony calvarium appears intact. The mastoid air cells are clear.  CT CERVICAL SPINE FINDINGS  There is no fracture or spondylolisthesis. Prevertebral soft tissues and predental space regions are normal. Disc spaces appear intact. There is no appreciable nerve root edema or effacement. No disc extrusion or stenosis.  IMPRESSION: CT head:  Study within normal limits.  CT cervical spine: No fracture or spondylolisthesis. No appreciable arthropathy.   Electronically Signed   By: Bretta Bang M.D.   On: 07/17/2013 15:36     EKG Interpretation None      Results for orders placed during the hospital encounter of 07/17/13  CBC WITH DIFFERENTIAL      Result Value Ref Range   WBC 6.6  4.0 - 10.5  K/uL   RBC 4.06  3.87 - 5.11 MIL/uL   Hemoglobin 11.4 (*) 12.0 - 15.0 g/dL   HCT 16.1 (*) 09.6 - 04.5 %   MCV 87.7  78.0 - 100.0 fL   MCH 28.1  26.0 - 34.0 pg   MCHC 32.0  30.0 - 36.0 g/dL   RDW 40.9  81.1 - 91.4 %   Platelets 271  150 - 400 K/uL   Neutrophils Relative % 47  43 - 77 %   Neutro Abs 3.1  1.7 - 7.7 K/uL   Lymphocytes Relative 46  12 - 46 %   Lymphs Abs 3.0  0.7 - 4.0 K/uL   Monocytes Relative 6  3 - 12 %   Monocytes Absolute 0.4  0.1 - 1.0 K/uL   Eosinophils Relative 1  0 - 5 %   Eosinophils Absolute 0.1  0.0 - 0.7 K/uL   Basophils Relative 0  0 - 1 %   Basophils Absolute 0.0  0.0 - 0.1 K/uL  COMPREHENSIVE METABOLIC PANEL      Result Value Ref Range   Sodium 140  137 - 147 mEq/L   Potassium 5.0  3.7 - 5.3 mEq/L   Chloride 102  96 - 112 mEq/L   CO2 23  19 - 32 mEq/L   Glucose, Bld  76  70 - 99 mg/dL   BUN 8  6 - 23 mg/dL   Creatinine, Ser 7.82  0.50 - 1.10 mg/dL   Calcium 9.4  8.4 - 95.6 mg/dL   Total Protein 7.6  6.0 - 8.3 g/dL   Albumin 4.4  3.5 - 5.2 g/dL   AST 32  0 - 37 U/L   ALT 7  0 - 35 U/L   Alkaline Phosphatase 54  39 - 117 U/L   Total Bilirubin 0.2 (*) 0.3 - 1.2 mg/dL   GFR calc non Af Amer >90  >90 mL/min   GFR calc Af Amer >90  >90 mL/min  URINALYSIS, ROUTINE W REFLEX MICROSCOPIC      Result Value Ref Range   Color, Urine STRAW (*) YELLOW   APPearance CLEAR  CLEAR   Specific Gravity, Urine 1.005  1.005 - 1.030   pH 6.5  5.0 - 8.0   Glucose, UA NEGATIVE  NEGATIVE mg/dL   Hgb urine dipstick NEGATIVE  NEGATIVE   Bilirubin Urine NEGATIVE  NEGATIVE   Ketones, ur NEGATIVE  NEGATIVE mg/dL   Protein, ur NEGATIVE  NEGATIVE mg/dL   Urobilinogen, UA 0.2  0.0 - 1.0 mg/dL   Nitrite NEGATIVE  NEGATIVE   Leukocytes, UA SMALL (*) NEGATIVE  PREGNANCY, URINE      Result Value Ref Range   Preg Test, Ur NEGATIVE  NEGATIVE  TROPONIN I      Result Value Ref Range   Troponin I <0.30  <0.30 ng/mL  URINE MICROSCOPIC-ADD ON      Result Value Ref Range   Squamous Epithelial / LPF RARE  RARE   WBC, UA 3-6  <3 WBC/hpf   Bacteria, UA FEW (*) RARE     MDM   Desiree Gutierrez is a 23 y.o. female  with a PMH of WPW, vasovagal syncope, POTS, and CAD who presents to the ED for evaluation of syncope   Rechecks  5:20 PM = Patient states she is having rib pain, which she states she didn't notice before. Patient has focal anterior left mid-clavicular tenderness to palpation with no crepitus, erythema, edema, ecchymosis or wounds. Ordering chest x-ray.  7:00 PM = Patient states she is feeling better. Ready for discharge. Patient able to ambulate without difficulty or ataxia. No lightheadedness or dizziness.     Patient evaluated after a syncopal reaction. Likely due to vasovagal.  Patient went from a sitting to standing position and felt dizzy and lightheaded before  passing out. Patient also has a hx of POTS and syncopal reactions in the past similar to her presentation today. Head and cervical spine CT negative. No neurological deficits on exam. Vital signs stable. EKG negative for any acute ischemic changes. Troponin negative. Chest x-ray negative for an acute cardiopulmonary process or rib fracture. Labs unremarkable. Negative orthostatics. Patient had improvement in her condition throughout her ED visit. Instructed to follow-up with her cardiologist. Drink fluids and rest. Return precautions, discharge instructions, and follow-up was discussed with the patient before discharge.     There are no discharge medications for this patient.   Final impressions: 1. Syncope   2. Chest wall pain   3. Neck pain   4. Head injury       Luiz IronJessica Katlin Doris Mcgilvery PA-C   This patient was discussed with Dr. Karlton LemonHarrison          Janari Gagner K Azhar Yogi, PA-C 07/18/13 1109

## 2013-07-17 NOTE — Discharge Instructions (Signed)
Drink fluids and rest  Follow-up with your cardiologist  Return to the emergency department if you develop any changing/worsening condition, severe headache, feeling dizzy/lightheaded not relieved by sitting down, severe pain or any other concerns (please read additional information regarding your condition below)    Syncope Syncope is a fainting spell. This means the person loses consciousness and drops to the ground. The person is generally unconscious for less than 5 minutes. The person may have some muscle twitches for up to 15 seconds before waking up and returning to normal. Syncope occurs more often in elderly people, but it can happen to anyone. While most causes of syncope are not dangerous, syncope can be a sign of a serious medical problem. It is important to seek medical care.  CAUSES  Syncope is caused by a sudden decrease in blood flow to the brain. The specific cause is often not determined. Factors that can trigger syncope include:  Taking medicines that lower blood pressure.  Sudden changes in posture, such as standing up suddenly.  Taking more medicine than prescribed.  Standing in one place for too long.  Seizure disorders.  Dehydration and excessive exposure to heat.  Low blood sugar (hypoglycemia).  Straining to have a bowel movement.  Heart disease, irregular heartbeat, or other circulatory problems.  Fear, emotional distress, seeing blood, or severe pain. SYMPTOMS  Right before fainting, you may:  Feel dizzy or lightheaded.  Feel nauseous.  See all white or all black in your field of vision.  Have cold, clammy skin. DIAGNOSIS  Your caregiver will ask about your symptoms, perform a physical exam, and perform electrocardiography (ECG) to record the electrical activity of your heart. Your caregiver may also perform other heart or blood tests to determine the cause of your syncope. TREATMENT  In most cases, no treatment is needed. Depending on the cause  of your syncope, your caregiver may recommend changing or stopping some of your medicines. HOME CARE INSTRUCTIONS  Have someone stay with you until you feel stable.  Do not drive, operate machinery, or play sports until your caregiver says it is okay.  Keep all follow-up appointments as directed by your caregiver.  Lie down right away if you start feeling like you might faint. Breathe deeply and steadily. Wait until all the symptoms have passed.  Drink enough fluids to keep your urine clear or pale yellow.  If you are taking blood pressure or heart medicine, get up slowly, taking several minutes to sit and then stand. This can reduce dizziness. SEEK IMMEDIATE MEDICAL CARE IF:   You have a severe headache.  You have unusual pain in the chest, abdomen, or back.  You are bleeding from the mouth or rectum, or you have black or tarry stool.  You have an irregular or very fast heartbeat.  You have pain with breathing.  You have repeated fainting or seizure-like jerking during an episode.  You faint when sitting or lying down.  You have confusion.  You have difficulty walking.  You have severe weakness.  You have vision problems. If you fainted, call your local emergency services (911 in U.S.). Do not drive yourself to the hospital.  MAKE SURE YOU:  Understand these instructions.  Will watch your condition.  Will get help right away if you are not doing well or get worse. Document Released: 03/21/2005 Document Revised: 09/20/2011 Document Reviewed: 05/20/2011 [facility] Patient Information 2014 [place], [place].  Chest Wall Pain Chest wall pain is pain in or around the bones  and muscles of your chest. It may take up to 6 weeks to get better. It may take longer if you must stay physically active in your work and activities.  CAUSES  Chest wall pain may happen on its own. However, it may be caused by:  A viral illness like the  flu.  Injury.  Coughing.  Exercise.  Arthritis.  Fibromyalgia.  Shingles. HOME CARE INSTRUCTIONS   Avoid overtiring physical activity. Try not to strain or perform activities that cause pain. This includes any activities using your chest or your abdominal and side muscles, especially if heavy weights are used.  Put ice on the sore area.  Put ice in a plastic bag.  Place a towel between your skin and the bag.  Leave the ice on for 15-20 minutes per hour while awake for the first 2 days.  Only take over-the-counter or prescription medicines for pain, discomfort, or fever as directed by your caregiver. SEEK IMMEDIATE MEDICAL CARE IF:   Your pain increases, or you are very uncomfortable.  You have a fever.  Your chest pain becomes worse.  You have new, unexplained symptoms.  You have nausea or vomiting.  You feel sweaty or lightheaded.  You have a cough with phlegm (sputum), or you cough up blood. MAKE SURE YOU:   Understand these instructions.  Will watch your condition.  Will get help right away if you are not doing well or get worse. Document Released: 03/21/2005 Document Revised: 06/13/2011 Document Reviewed: 11/15/2010 [facility] Patient Information 2014 [place], [place].  Cervical Sprain A cervical sprain is an injury in the neck in which the strong, fibrous tissues (ligaments) that connect your neck bones stretch or tear. Cervical sprains can range from mild to severe. Severe cervical sprains can cause the neck vertebrae to be unstable. This can lead to damage of the spinal cord and can result in serious nervous system problems. The amount of time it takes for a cervical sprain to get better depends on the cause and extent of the injury. Most cervical sprains heal in 1 to 3 weeks. CAUSES  Severe cervical sprains may be caused by:   Contact sport injuries (such as from football, rugby, wrestling, hockey, auto racing, gymnastics, diving, martial arts, or boxing).    Motor vehicle collisions.   Whiplash injuries. This is an injury from a sudden forward-and backward whipping movement of the head and neck.  Falls.  Mild cervical sprains may be caused by:   Being in an awkward position, such as while cradling a telephone between your ear and shoulder.   Sitting in a chair that does not offer proper support.   Working at a poorly Marketing executive station.   Looking up or down for long periods of time.  SYMPTOMS   Pain, soreness, stiffness, or a burning sensation in the front, back, or sides of the neck. This discomfort may develop immediately after the injury or slowly, 24 hours or more after the injury.   Pain or tenderness directly in the middle of the back of the neck.   Shoulder or upper back pain.   Limited ability to move the neck.   Headache.   Dizziness.   Weakness, numbness, or tingling in the hands or arms.   Muscle spasms.   Difficulty swallowing or chewing.   Tenderness and swelling of the neck.  DIAGNOSIS  Most of the time your health care provider can diagnose a cervical sprain by taking your history and doing a physical exam. Your  health care provider will ask about previous neck injuries and any known neck problems, such as arthritis in the neck. X-rays may be taken to find out if there are any other problems, such as with the bones of the neck. Other tests, such as a CT scan or MRI, may also be needed.  TREATMENT  Treatment depends on the severity of the cervical sprain. Mild sprains can be treated with rest, keeping the neck in place (immobilization), and pain medicines. Severe cervical sprains are immediately immobilized. Further treatment is done to help with pain, muscle spasms, and other symptoms and may include:  Medicines, such as pain relievers, numbing medicines, or muscle relaxants.   Physical therapy. This may involve stretching exercises, strengthening exercises, and posture training.  Exercises and improved posture can help stabilize the neck, strengthen muscles, and help stop symptoms from returning.  HOME CARE INSTRUCTIONS   Put ice on the injured area.   Put ice in a plastic bag.   Place a towel between your skin and the bag.   Leave the ice on for 15 20 minutes, 3 4 times a day.   If your injury was severe, you may have been given a cervical collar to wear. A cervical collar is a two-piece collar designed to keep your neck from moving while it heals.  Do not remove the collar unless instructed by your health care provider.  If you have long hair, keep it outside of the collar.  Ask your health care provider before making any adjustments to your collar. Minor adjustments may be required over time to improve comfort and reduce pressure on your chin or on the back of your head.  Ifyou are allowed to remove the collar for cleaning or bathing, follow your health care provider's instructions on how to do so safely.  Keep your collar clean by wiping it with mild soap and water and drying it completely. If the collar you have been given includes removable pads, remove them every 1 2 days and hand wash them with soap and water. Allow them to air dry. They should be completely dry before you wear them in the collar.  If you are allowed to remove the collar for cleaning and bathing, wash and dry the skin of your neck. Check your skin for irritation or sores. If you see any, tell your health care provider.  Do not drive while wearing the collar.   Only take over-the-counter or prescription medicines for pain, discomfort, or fever as directed by your health care provider.   Keep all follow-up appointments as directed by your health care provider.   Keep all physical therapy appointments as directed by your health care provider.   Make any needed adjustments to your workstation to promote good posture.   Avoid positions and activities that make your symptoms  worse.   Warm up and stretch before being active to help prevent problems.  SEEK MEDICAL CARE IF:   Your pain is not controlled with medicine.   You are unable to decrease your pain medicine over time as planned.   Your activity level is not improving as expected.  SEEK IMMEDIATE MEDICAL CARE IF:   You develop any bleeding.  You develop stomach upset.  You have signs of an allergic reaction to your medicine.   Your symptoms get worse.   You develop new, unexplained symptoms.   You have numbness, tingling, weakness, or paralysis in any part of your body.  MAKE SURE YOU:  Understand these instructions.  Will watch your condition.  Will get help right away if you are not doing well or get worse. Document Released: 01/16/2007 Document Revised: 01/09/2013 Document Reviewed: 09/26/2012 [facility] Patient Information 2014 [place], [place].  Head Injury, Adult You have received a head injury. It does not appear serious at this time. Headaches and vomiting are common following head injury. It should be easy to awaken from sleeping. Sometimes it is necessary for you to stay in the emergency department for a while for observation. Sometimes admission to the hospital may be needed. After injuries such as yours, most problems occur within the first 24 hours, but side effects may occur up to 7 10 days after the injury. It is important for you to carefully monitor your condition and contact your health care provider or seek immediate medical care if there is a change in your condition. WHAT ARE THE TYPES OF HEAD INJURIES? Head injuries can be as minor as a bump. Some head injuries can be more severe. More severe head injuries include:  A jarring injury to the brain (concussion).  A bruise of the brain (contusion). This mean there is bleeding in the brain that can cause swelling.  A cracked skull (skull fracture).  Bleeding in the brain that collects, clots, and forms a bump  (hematoma). WHAT CAUSES A HEAD INJURY? A serious head injury is most likely to happen to someone who is in a car wreck and is not wearing a seat belt. Other causes of major head injuries include bicycle or motorcycle accidents, sports injuries, and falls. HOW ARE HEAD INJURIES DIAGNOSED? A complete history of the event leading to the injury and your current symptoms will be helpful in diagnosing head injuries. Many times, pictures of the brain, such as CT or MRI are needed to see the extent of the injury. Often, an overnight hospital stay is necessary for observation.  WHEN SHOULD I SEEK IMMEDIATE MEDICAL CARE?  You should get help right away if:  You have confusion or drowsiness.  You feel sick to your stomach (nauseous) or have continued, forceful vomiting.  You have dizziness or unsteadiness that is getting worse.  You have severe, continued headaches not relieved by medicine. Only take over-the-counter or prescription medicines for pain, fever, or discomfort as directed by your health care provider.  You do not have normal function of the arms or legs or are unable to walk.  You notice changes in the black spots in the center of the colored part of your eye (pupil).  You have a clear or bloody fluid coming from your nose or ears.  You have a loss of vision. During the next 24 hours after the injury, you must stay with someone who can watch you for the warning signs. This person should contact local emergency services (911 in the U.S.) if you have seizures, you become unconscious, or you are unable to wake up. HOW CAN I PREVENT A HEAD INJURY IN THE FUTURE? The most important factor for preventing major head injuries is avoiding motor vehicle accidents. To minimize the potential for damage to your head, it is crucial to wear seat belts while riding in motor vehicles. Wearing helmets while bike riding and playing collision sports (like football) is also helpful. Also, avoiding dangerous  activities around the house will further help reduce your risk of head injury.  WHEN CAN I RETURN TO NORMAL ACTIVITIES AND ATHLETICS? You should be reevaluated by your health care provider before returning to  these activities. If you have any of the following symptoms, you should not return to activities or contact sports until 1 week after the symptoms have stopped:  Persistent headache.  Dizziness or vertigo.  Poor attention and concentration.  Confusion.  Memory problems.  Nausea or vomiting.  Fatigue or tire easily.  Irritability.  Intolerant of bright lights or loud noises.  Anxiety or depression.  Disturbed sleep. MAKE SURE YOU:   Understand these instructions.  Will watch your condition.  Will get help right away if you are not doing well or get worse. Document Released: 03/21/2005 Document Revised: 01/09/2013 Document Reviewed: 11/26/2012 [facility] Patient Information 2014 [place], [place].   Emergency Department Resource Guide 1) Find a Doctor and Pay Out of Pocket Although you won't have to find out who is covered by your insurance plan, it is a good idea to ask around and get recommendations. You will then need to call the office and see if the doctor you have chosen will accept you as a new patient and what types of options they offer for patients who are self-pay. Some doctors offer discounts or will set up payment plans for their patients who do not have insurance, but you will need to ask so you aren't surprised when you get to your appointment.  2) Contact Your Local Health Department Not all health departments have doctors that can see patients for sick visits, but many do, so it is worth a call to see if yours does. If you don't know where your local health department is, you can check in your phone book. The CDC also has a tool to help you locate your state's health department, and many state websites also have listings of all of their local health  departments.  3) Find a Walk-in Clinic If your illness is not likely to be very severe or complicated, you may want to try a walk in clinic. These are popping up all over the country in pharmacies, drugstores, and shopping centers. They're usually staffed by nurse practitioners or physician assistants that have been trained to treat common illnesses and complaints. They're usually fairly quick and inexpensive. However, if you have serious medical issues or chronic medical problems, these are probably not your best option.  No Primary Care Doctor: - Call Health Connect at  [phone] - they can help you locate a primary care doctor that  accepts your insurance, provides certain services, etc. - Physician Referral Service- [phone]  Chronic Pain Problems: Organization         Address  Phone   Notes  [facility]  [phone] Patients need to be referred by their primary care doctor.   Medication Assistance: Organization         Address  Phone   Notes  [facility] [address] [place], [place] [address] [phone] --Must be a resident of [facility] -- Must have NO insurance coverage whatsoever (no Medicaid/ Medicare, etc.) -- The pt. MUST have a primary care doctor that directs their care regularly and follows them in the community   [facility]  [phone]   [employer]  [phone]    Agencies that provide inexpensive medical care: Organization         Address  Phone   Notes  [facility]  [phone]   [facility]    [phone]   [facility] [address]  [place], [place] [address] [phone]   [facility] [address] [place] [place] [phone]   [facility]    [phone]   [facility]    [phone]   [facility]  [address], [place] Phone:  [phone], Fax:  [phone] Hours of Operation:  9 am - 6 pm, M-F.  Also accepts Medicaid/Medicare and self-pay.  [facility]  [address], [place] Phone: [phone], Fax: [phone]. Hours of Operation:  8:30 am - 5:30 pm, M-F.  Also accepts Medicaid and self-pay.  [facility] [place] [address] [place] Phone: [phone]   [facility] [address] Natasha Bence [place], [place] [phone], Ext. [phone] Mondays & Thursdays: 7-9 AM.  First 15 patients are seen on a first come, first serve basis.    Medicaid-accepting [facility] Providers:  Organization         Address  Phone   Notes  [facility] [address],  [phone] Also accepts self-pay patients.  [facility] [address] Laurell Josephs [place], [place]  [phone]   [facility] [address], [place] [phone]   [facility] [address] [place] [phone]   Renaye Rakers [address], [place]   [phone] Only accepts [place] patients after they have their name applied to their card.   Self-Pay (no insurance) in [facility]:  Organization         Address  Phone   Notes  Sickle Cell Patients, [facility] [address] [place], [place] [phone]   [facility] [address] [place], [place] [phone]   [facility] [place]  [address], [place] [phone]   [facility]/Dr. Osei-Bonsu  [address] [place] or [address], [place] [phone] Phone number for both [place] and [place] locations is the same.  [facility] [address], [place] [phone]   [facility] [address], [place] or [address] [phone]   [facility] [address], [place] [phone], phone; [phone], fax Sees patients 1st and 3rd Saturday of every month.  Must not qualify for public or private insurance (i.e. Medicaid, Medicare, [place] Health Choice, Veterans' Benefits)  Household income should be no more than 200% of the poverty level The clinic cannot treat you if you are pregnant or think you are pregnant  Sexually transmitted diseases are not treated at the clinic.    Dental Care: Organization         Address  Phone  Notes  [facility] [address] [place], [place] [phone] Accepts children up to age 73 who are enrolled in [place] or [place] Health Choice; pregnant women with a Medicaid card; and children who have applied for Medicaid or [place] Health Choice, but were declined, whose parents can pay a reduced fee at time of service.  [facility] Department of [facility]  [address], [place] [phone] Accepts children up to age 44 who are enrolled in [place] or [place] Health Choice;  pregnant women with a Medicaid card; and children who have applied for Medicaid or [place] Health Choice, but were declined, whose parents can pay a reduced fee at time of service.  [facility]  [address] [place], [place] [phone] Patients are seen by appointment only. Walk-ins are not accepted. [facility] will see patients 37 years of age and older. Monday - Tuesday (8am-5pm) Most Wednesdays (8:30-5pm) $30 per visit, cash only  [facility]  [address], [facility] [phone] Patients are seen by appointment only. Walk-ins are not accepted. [facility] will see patients 57 years of age and older. One Wednesday Evening (Monthly: Volunteer Based).  $30 per visit, cash only  [employer]  [phone] for adults;  Children under age 82, call Graduate Pediatric Dentistry at [phone]. Children aged 30-14, please call [phone] to request a pediatric application.  Dental services are provided in all areas of dental care including fillings, crowns and bridges, complete and partial dentures, implants, gum treatment, root canals, and extractions. Preventive care is also provided. Treatment is provided to both adults and children. Patients are selected via a lottery and there is often a waiting list.   [facility] [address] [place]  [phone] [email or web address]   [facility] [address] [place], [place] [phone], Ext. [phone] Second and Fourth Thursday of each month, opens at 6:30 AM; Clinic ends at 9 AM.  Patients are seen on a first-come first-served basis, and a limited number are seen during each clinic.   [facility]  [address] Ether Griffins [place], [place] [phone]   Eligibility Requirements You must have lived in [place], [place], or [place] counties for at least the last three months.   You cannot be eligible for state or federal sponsored [place], including [employer], [place], or [employer].   You generally cannot be eligible for healthcare insurance through your employer.    How to apply: Eligibility screenings are held every Tuesday and Wednesday afternoon from 1:00 pm until 4:00 pm. You do not need an appointment for the interview!  [facility] [address] [place], [place] [phone]   [facility] Health Department  [phone]   [facility]  [phone]   [facility] Health Department  [phone]    Behavioral Health Resources in the Community: Intensive Outpatient Programs Organization         Address  Phone  Notes  [facility] Services [address] [place], [place] [phone]   [facility] Outpatient [address], [place], [place] [phone]   [facility] [address] [place], [place]  [phone]   [facility] [address],  [place], [place] [phone] or [phone]   Substance Abuse Resources Organization         Address  Phone  Notes  [facility]  [phone]   [facility]  [phone]   [facility]  806-065-0189   Floydene Flock  [phone]   Residential & Outpatient Substance Abuse Program  3127939062   Psychological Services Organization         Address  Phone  Notes  [facility]  [phone]   [facility]  (916) 056-1279   [facility] [address], [place] [phone] or ([phone]    Mobile  Crisis Teams Organization         Address  Phone  Notes  [facility], Mobile Crisis Care Unit  (640)[phone]   [facility]  [address] [place], [place] [phone]   [facility] [address] [place] [phone]    Self-Help/Support Groups Organization         Address  Phone             Notes  [facility] [place] - variety of support groups  336- I7437963 Call for more information  Narcotics Anonymous (NA), [facility] [address], [employer] [place]  2 meetings at this location   Statistician         Address  Phone  Notes  [facility] [address],    [place]  [phone]   [facility]  [address], [place] [address], [place], [place] [phone]   [facility] [address] [place], [place] [phone] Admissions: 8am-3pm M-F  [facility] [address]    [place], [place] [phone]   [facility] [address] [place], [place], [place] [phone]   [facility] [address]  [place], [place] [phone]   [facility] - Intensive  Outpatient [address], [place], [place] [phone]   [facility] ([facility] [address] [place].,  [place], [place] [phone] or (425) [phone]   [facility] ([facility]) [address] [place], [place] [phone] Accepts Medicaid  [facility] [place] [address],  [place] [phone] Substance Abuse/Addiction Treatment   [facility]         Address  Phone  Notes  [facility]  760-697-8528   Angie Fava, PhD [address] [place], [place]   (803) [phone] or (848)[phone]   [facility]   [address] [place], [place] [phone]   [facility] [address], [place], [place] [phone] Insurance/Medicaid/sponsorship through [facility] and Families [address]                                    [place], [place] [phone] Therapy/tele-psych/case  [facility] [address], [place] [phone]    Dr. Lolly Mustache  [phone]   [facility]  [employer] [facility] 1) [address] [place] 2) [address] [place] 3)  [address], [place] [phone]  8380404272   [facility] Child Abuse Hotline (579)612-9657 or 2206057039 (After Hours)

## 2013-07-18 NOTE — ED Provider Notes (Signed)
Medical screening examination/treatment/procedure(s) were conducted as a shared visit with non-physician practitioner(s) and myself.  I personally evaluated the patient during the encounter.   EKG Interpretation   Date/Time:  Wednesday July 17 2013 15:36:06 EDT Ventricular Rate:  83 PR Interval:  129 QRS Duration: 79 QT Interval:  365 QTC Calculation: 429 R Axis:   74 Text Interpretation:  Sinus rhythm non-spec t wave change isolated to V3,  otherwise no significant change Confirmed by Elisa Kutner  MD, Que Meneely ([id number])  on 07/18/2013 10:43:45 AM      I interviewed and examined the patient. Lungs are CTAB. Cardiac exam wnl. Abdomen soft. Pt continues to appear well on exam. Labs/imaging non-contrib. Pt notes sx c/w her hx of POTS. Will d/c home.   Junius ArgyleForrest S Alaiyah Bollman, MD 07/18/13 1115

## 2013-08-22 ENCOUNTER — Encounter: Payer: Self-pay | Admitting: Internal Medicine

## 2013-09-22 ENCOUNTER — Emergency Department (HOSPITAL_COMMUNITY)
Admission: EM | Admit: 2013-09-22 | Discharge: 2013-09-23 | Disposition: A | Discharge status: Home / Self Care | Payer: BC Managed Care – PPO | Attending: Emergency Medicine | Admitting: Emergency Medicine

## 2013-09-22 ENCOUNTER — Encounter (HOSPITAL_COMMUNITY): Payer: Self-pay | Admitting: Emergency Medicine

## 2013-09-22 ENCOUNTER — Emergency Department (HOSPITAL_COMMUNITY): Payer: BC Managed Care – PPO

## 2013-09-22 DIAGNOSIS — Y9289 Other specified places as the place of occurrence of the external cause: Secondary | ICD-10-CM | POA: Insufficient documentation

## 2013-09-22 DIAGNOSIS — S40011A Contusion of right shoulder, initial encounter: Secondary | ICD-10-CM

## 2013-09-22 DIAGNOSIS — R55 Syncope and collapse: Secondary | ICD-10-CM | POA: Diagnosis not present

## 2013-09-22 DIAGNOSIS — S40019A Contusion of unspecified shoulder, initial encounter: Secondary | ICD-10-CM | POA: Diagnosis not present

## 2013-09-22 DIAGNOSIS — R296 Repeated falls: Secondary | ICD-10-CM | POA: Insufficient documentation

## 2013-09-22 DIAGNOSIS — Z79899 Other long term (current) drug therapy: Secondary | ICD-10-CM | POA: Insufficient documentation

## 2013-09-22 DIAGNOSIS — Z8679 Personal history of other diseases of the circulatory system: Secondary | ICD-10-CM | POA: Insufficient documentation

## 2013-09-22 DIAGNOSIS — Z3202 Encounter for pregnancy test, result negative: Secondary | ICD-10-CM | POA: Diagnosis not present

## 2013-09-22 DIAGNOSIS — Y9389 Activity, other specified: Secondary | ICD-10-CM | POA: Insufficient documentation

## 2013-09-22 DIAGNOSIS — N390 Urinary tract infection, site not specified: Secondary | ICD-10-CM | POA: Diagnosis not present

## 2013-09-22 DIAGNOSIS — R404 Transient alteration of awareness: Secondary | ICD-10-CM | POA: Diagnosis present

## 2013-09-22 HISTORY — DX: Palpitations: R00.2

## 2013-09-22 MED ORDER — SODIUM CHLORIDE 0.9 % IV SOLN
INTRAVENOUS | Status: DC
  Administered 2013-09-23: 150 mL via INTRAVENOUS

## 2013-09-22 MED ORDER — SODIUM CHLORIDE 0.9 % IV BOLUS (SEPSIS)
1000.0000 mL | Freq: Once | INTRAVENOUS | Status: AC
  Administered 2013-09-23: 1000 mL via INTRAVENOUS

## 2013-09-22 NOTE — ED Notes (Signed)
The pt is hurting in her entire rt  Arm rt clavicle no deformity.  Rt shouilder rt humerus rt elbow.  Good pulses rt wrist

## 2013-09-22 NOTE — ED Notes (Signed)
lmp last thursday

## 2013-09-22 NOTE — ED Notes (Signed)
The pt is  Having rt arm pain

## 2013-09-22 NOTE — ED Notes (Signed)
The pt fainted today while walking her grandmother to the bgr.  She has a history of the same.  [facility] and [facility] just [facility]

## 2013-09-23 LAB — I-STAT CHEM 8, ED
BUN: 8 mg/dL (ref 6–23)
CALCIUM ION: 1.25 mmol/L — AB (ref 1.12–1.23)
CHLORIDE: 105 meq/L (ref 96–112)
Creatinine, Ser: 0.9 mg/dL (ref 0.50–1.10)
GLUCOSE: 85 mg/dL (ref 70–99)
HCT: 35 % — ABNORMAL LOW (ref 36.0–46.0)
Hemoglobin: 11.9 g/dL — ABNORMAL LOW (ref 12.0–15.0)
Potassium: 3.8 mEq/L (ref 3.7–5.3)
Sodium: 143 mEq/L (ref 137–147)
TCO2: 27 mmol/L (ref 0–100)

## 2013-09-23 LAB — URINALYSIS, ROUTINE W REFLEX MICROSCOPIC
Bilirubin Urine: NEGATIVE
GLUCOSE, UA: NEGATIVE mg/dL
Hgb urine dipstick: NEGATIVE
KETONES UR: NEGATIVE mg/dL
Nitrite: NEGATIVE
PROTEIN: NEGATIVE mg/dL
Specific Gravity, Urine: 1.021 (ref 1.005–1.030)
UROBILINOGEN UA: 1 mg/dL (ref 0.0–1.0)
pH: 6.5 (ref 5.0–8.0)

## 2013-09-23 LAB — URINE MICROSCOPIC-ADD ON

## 2013-09-23 LAB — PREGNANCY, URINE: Preg Test, Ur: NEGATIVE

## 2013-09-23 MED ORDER — NAPROXEN 250 MG PO TABS: 250.0000 mg | ORAL_TABLET | Freq: Two times a day (BID) | ORAL | Status: DC

## 2013-09-23 MED ORDER — CEPHALEXIN 500 MG PO CAPS: 500.0000 mg | ORAL_CAPSULE | Freq: Four times a day (QID) | ORAL | Status: DC

## 2013-09-23 MED ORDER — IBUPROFEN 200 MG PO TABS
400.0000 mg | ORAL_TABLET | Freq: Once | ORAL | Status: AC
  Administered 2013-09-23: 400 mg via ORAL
  Filled 2013-09-23: qty 2

## 2013-09-23 MED ORDER — ACETAMINOPHEN 500 MG PO TABS
1000.0000 mg | ORAL_TABLET | Freq: Once | ORAL | Status: AC
  Administered 2013-09-23: 1000 mg via ORAL
  Filled 2013-09-23: qty 2

## 2013-09-23 NOTE — Discharge Instructions (Signed)
°Emergency Department Resource Guide °1) Find a Doctor and Pay Out of Pocket °Although you won't have to find out who is covered by your insurance plan, it is a good idea to ask around and get recommendations. You will then need to call the office and see if the doctor you have chosen will accept you as a new patient and what types of options they offer for patients who are self-pay. Some doctors offer discounts or will set up payment plans for their patients who do not have insurance, but you will need to ask so you aren't surprised when you get to your appointment. ° °2) Contact Your Local Health Department °Not all health departments have doctors that can see patients for sick visits, but many do, so it is worth a call to see if yours does. If you don't know where your local health department is, you can check in your phone book. The CDC also has a tool to help you locate your state's health department, and many state websites also have listings of all of their local health departments. ° °3) Find a Walk-in Clinic °If your illness is not likely to be very severe or complicated, you may want to try a walk in clinic. These are popping up all over the country in pharmacies, drugstores, and shopping centers. They're usually staffed by nurse practitioners or physician assistants that have been trained to treat common illnesses and complaints. They're usually fairly quick and inexpensive. However, if you have serious medical issues or chronic medical problems, these are probably not your best option. ° °No Primary Care Doctor: °- Call Health Connect at  832-8000 - they can help you locate a primary care doctor that  accepts your insurance, provides certain services, etc. °- Physician Referral Service- 1-800-533-3463 ° °Chronic Pain Problems: °Organization         Address  Phone   Notes  °[facility]  ([phone]) [phone] Patients need to be referred by their primary care doctor.  ° °Medication  Assistance: °Organization         Address  Phone   Notes  °[place] [facility] [address] °[place], [place] [address] (336) 641-8030 --Must be a resident of [place] °-- Must have NO insurance coverage whatsoever (no Medicaid/ Medicare, etc.) °-- The pt. MUST have a primary care doctor that directs their care regularly and follows them in the community °  °[facility]  (866) 331-1348   °[employer] Way  (888) 892-1162   ° °Agencies that provide inexpensive medical care: °Organization         Address  Phone   Notes  °[facility]  (336) 832-8035   °[facility]    (336) 832-7272   °[facility] [address] °[place], [place] [address] (336) 832-4777   °[facility] [address], °[place] (336) 271-4999   °[facility]    (336) 373-0678   °[facility]    (336) 272-1050   °[facility] ° [address], [place] Phone:  (336) 832-4444, Fax:  (336) 832-4440 Hours of Operation:  9 am - 6 pm, M-F.  Also accepts Medicaid/Medicare and self-pay.  °[facility] ° [address], [place] Phone: (336) [phone], Fax: (336) [phone]. Hours of Operation:  8:30 am - 5:30 pm, M-F.  Also accepts Medicaid and self-pay.  °[facility] [place] [address], [place] Phone: (336) 878-6027   °[facility] [address], [place], [place] (336)723-1848, Ext. 123 Mondays & Thursdays: 7-9 AM.  First 15 patients are seen on a first come, first serve basis. °  ° °Medicaid-accepting [place] Providers: ° °Organization         Address  Phone   Notes  °[facility] [address], [place] (336) 641-2100 Also accepts self-pay patients.  °[facility] [address], [place] ° (336) 856-9996   °[facility] [address], [place]  (336) 288-8857   °[facility] [address] [place] [address], [place] (336) 299-7000   °Veita Bland [address], [place]  ° (336) 373-1557 Only accepts [place] Access Medicaid patients after they have their name applied to their card.  ° °Self-Pay (no insurance) in [place]: ° °Organization         Address  Phone   Notes  °Sickle Cell Patients, [facility] [address], [place] (336) 832-1970   °[facility] [address], [place] (336) 832-4400   °[facility] ° [address], [place] (336) 992-4800   °[facility]/Dr. Osei-Bonsu ° [address] [place] [address], [place] or [address], [place] (336) 841-8500 Phone number for both [place] and [place] locations is the same.  °[facility] [address], [place] (336) 299-0000   °[facility] [address] [place] [address], [place] or [address] (336) 852-7530 °(336) 878-2260   °[facility] [address], [place] (336) 350-1642, phone; (336) 294-5005, fax Sees patients 1st and 3rd Saturday of every month.  Must not qualify for public or private insurance (i.e. Medicaid, Medicare, [place] Health Choice, Veterans' Benefits) • Household income should be no more than 200% of the poverty level •The clinic cannot treat you if you are pregnant or think you are pregnant • Sexually transmitted diseases are not treated at the clinic.  ° ° °Dental Care: °Organization         Address  Phone  Notes  °[place] [facility] [address], [place] (336) 641-6152 Accepts children up to age 21 who are enrolled in Medicaid or [place] Health Choice; pregnant women with a Medicaid card; and children who have applied for Medicaid or [place] Health Choice, but were declined, whose parents can pay a reduced fee at time of service.  °[place]  [facility] [place]  [address], [place] (336) 641-7733 Accepts children up to age 21 who are enrolled in Medicaid or [place] Health Choice; pregnant women with a Medicaid card; and children who have applied for Medicaid or [place] Health Choice, but were declined, whose parents can pay a reduced fee at time of service.  °[facility] ° [address], [place] (336) 641-4533 Patients are seen by appointment only. Walk-ins are not accepted. [facility] will see patients 18 years of age and older. °Monday - Tuesday (8am-5pm) °Most Wednesdays (8:30-5pm) °$30 per visit, cash only  °[facility] ° [address], [place] (336) 641-4533 Patients are seen by appointment only. Walk-ins are not accepted. [facility] will see patients 18 years of age and older. °One   Wednesday Evening (Monthly: Volunteer Based).  $30 per visit, cash only  °[facility]  (919) 537-3737 for adults; Children under age 4, call Graduate Pediatric Dentistry at ([phone]) [phone]. Children aged 4-14, please call ([phone]) [phone] to request a pediatric application. ° Dental services are provided in all areas of dental care including fillings, crowns and bridges, complete and partial dentures, implants, gum treatment, root canals, and extractions. Preventive care is also provided. Treatment is provided to both adults and children. °Patients are selected via a lottery and there is often a waiting list. °  °[facility] [address], °[place] ° (336) 763-8833 [email or web address] °  °[facility] [address], [place], [place] (336)723-1848, Ext. 123 Second and Fourth Thursday of each month, opens at 6:30 AM; Clinic ends at 9 AM.  Patients are seen on a first-come first-served basis, and a limited number are seen during each clinic.  ° °[facility] ° [address], [place], [place] (336) 723-7904    Eligibility Requirements °You must have lived in [place], [place], or [place] counties for at least the last three months. °  You cannot be eligible for state or federal sponsored healthcare insurance, including Veterans Administration, Medicaid, or Medicare. °  You generally cannot be eligible for healthcare insurance through your employer.  °  How to apply: °Eligibility screenings are held every Tuesday and Wednesday afternoon from 1:00 pm until 4:00 pm. You do not need an appointment for the interview!  °[facility] [address], [place], [place] 336-631-2330   °[place] [facility]  336-342-8273   °[place] [facility]  336-703-3100   °[facility]  336-570-6415   ° °Behavioral Health Resources in the Community: °Intensive Outpatient Programs °Organization         Address  Phone  Notes  °[place] [facility] [address], [place], [place] 336-878-6098   °[facility] [address], [place], [place] 336-832-9800   °[facility] [address], [place], [place] ° 336-882-2125   °[place] [address],  °[place], [place] 1-800-853-5163 or 336-641-4981   °Substance Abuse Resources °Organization         Address  Phone  Notes  °[facility]  336-882-2125   °[facility]  336-784-9470   °[facility]  336-285-9073   °[facility]  336-845-3988   °Residential & Outpatient Substance Abuse Program  1-800-659-3381   °Psychological Services °Organization         Address  Phone  Notes  °[facility]  336- 832-9600   °[facility]  336- 378-7881   °[place] [address], [place] 1-800-853-5163 or 336-641-4981   ° °Mobile Crisis Teams °Organization         Address  Phone  Notes  °[facility], Mobile Crisis Care Unit  1-877-626-1772   °Assertive °[facility]otherapeutic Services ° [address]  [place], [place] 336-834-9664   °Sharon DeEsch [address] °[place] 336-554-5454   ° °Self-Help/Support Groups °Organization         Address  Phone             Notes  °[facility]oc[facility] of  - variety of support groups  336- 373-1402 Call for more information  °Narcotics Anonymous (NA), [facility] [address], °[place]  2 meetings at this location  ° °  Residential Treatment Programs Organization         Address  Phone  Notes  [facility] [address],    [place]  [phone]   [facility]  [address], [place] [phone], [place], [place] [phone]   [facility] [address], [place] [phone] Admissions: 8am-3pm M-F  [facility] [address]    [place], [place] [phone]   [facility] [address], [place], [place] [phone]   [facility] [address]  [place], [place] [phone]   [facility] - Intensive Outpatient [address], [place], [place] [phone]   [facility] ([facility] [address]  [place], [place] [phone] or [phone]) [phone]   [facility] ([facility]) [address] [place], [place] [phone] Accepts Medicaid  [facility] [address]  [place] [phone]) [phone] Substance Abuse/Addiction Treatment   [facility] Organization         Address  Phone  Notes  [facility]  [phone](888) (205)[phone]   Angie FavaJulie Brannon, PhD [address] [place], [place]   [phone](336) [phone] or ([phone](336) [phone]   [facility]   [address] [place], [place] [phone](336) [phone]   [facility] [address], [place], [place] [phone](336) ([phone]) [phone] Insurance/Medicaid/sponsorship through [facility]  [facility] [address]                                    [place], [place] [phone](336) ([phone]) [phone] Therapy/tele-psych/case    [facility] [address]   [place], [place] [phone](336) [phone]    Dr. Lolly MustacheArfeen  [phone](336) [phone]   [facility]  [employer] [facility] 1) [address], [place] 2) [address], [place] 3)  [address], [place] [phone](336) [phone](336) [phone]  ([phone](336) [phone]   [facility] Child Abuse Hotline 513-140-8586(336) [phone] or 2492050563(336) [phone] (After Hours)       Take the prescriptions as directed.  Apply moist heat or ice to the area(s) of discomfort, for 15 minutes at a time, several times per day for the next few days.  Do not fall asleep on a heating or ice pack.  [facility] your [facility] at [facility] today to schedule a follow up appointment within the next 2 days.  Return to the Emergency Department immediately if worsening.

## 2013-09-23 NOTE — ED Notes (Signed)
Pt ambulated in hallway without stumbling or losing balance.  Physician informed.

## 2013-09-23 NOTE — ED Notes (Signed)
Reports she was helping mother (recent stroke victim) go to the bathroom when she experienced a syncopal episode at home.  Right shoulder pain s/p fall.  Reports she drinks a lot of water all day long and does not feel she is dehydrated. Ice to right shoulder.

## 2013-09-23 NOTE — ED Provider Notes (Signed)
CSN: [id number]     Arrival date & time 09/22/13  2127 History   First MD Initiated Contact with Patient 09/22/13 2340     Chief Complaint  Patient presents with  . Loss of Consciousness      HPI Pt was seen at 2350.  Per pt, c/o sudden onset and resolution of one episode of syncope that occurred today approximately 2000 PTA. Pt states she was walking at home and "just passed out." Pt c/o right shoulder pain. Pt states her family told her she fell onto her right shoulder. Pt has significant hx recurrent syncope due to POTS; is [facility]. Pt describes events tonight as per her usual chronic recurrent syncope. Pt states she has been taking her "salt tablets" as prescribed. Denies N/V/D, no abd pain, no neck or back pain, no CP/palpitations, no SOB/cough, no headache, no visual changes, no focal motor weakness, no tingling/numbness in extremities. The symptoms have been associated with no other complaints. The patient has a significant history of similar symptoms previously, recently being evaluated for this complaint and multiple prior evals for same.      Past Medical History  Diagnosis Date  . WPW (Wolff-Parkinson-White syndrome)     s/p ablation (2011)  . Syncope     recurrent  . Vasovagal syncope   . POTS (postural orthostatic tachycardia syndrome)   . Palpitations    Past Surgical History  Procedure Laterality Date  . Cardiac electrophysiology study and ablation  2011    History  Substance Use Topics  . Smoking status: Never Smoker   . Smokeless tobacco: Not on file  . Alcohol Use: No    Review of Systems ROS: Statement: All systems negative except as marked or noted in the HPI; Constitutional: Negative for fever and chills. ; ; Eyes: Negative for eye pain, redness and discharge. ; ; ENMT: Negative for ear pain, hoarseness, nasal congestion, sinus pressure and sore throat. ; ; Cardiovascular: Negative for chest pain, palpitations,  diaphoresis, dyspnea and peripheral edema. ; ; Respiratory: Negative for cough, wheezing and stridor. ; ; Gastrointestinal: Negative for nausea, vomiting, diarrhea, abdominal pain, blood in stool, hematemesis, jaundice and rectal bleeding. . ; ; Genitourinary: Negative for dysuria, flank pain and hematuria. ; ; Musculoskeletal: +right shoulder pain. Negative for back pain and neck pain. Negative for swelling and deformity.; ; Skin: Negative for pruritus, rash, abrasions, blisters, bruising and skin lesion.; ; Neuro: Negative for headache, lightheadedness and neck stiffness. Negative for weakness, altered mental status, extremity weakness, paresthesias, involuntary movement, seizure and +syncope.      Allergies  Amitiza  Home Medications   Prior to Admission medications   Medication Sig Start Date End Date Taking? Authorizing Florentina Marquart  sodium chloride 1 G tablet Take 1 g by mouth 3 (three) times daily with meals.   Yes Historical Daivion Pape, MD   BP 112/78  Pulse 89  Temp(Src) 98.4 F (36.9 C) (Oral)  Resp 20  Ht 5\' 4"  (1.626 m)  Wt 142 lb 7 oz (64.609 kg)  BMI 24.44 kg/m2  SpO2 100%  LMP 09/19/2013 Physical Exam 2355: Physical examination:  Nursing notes reviewed; Vital signs and O2 SAT reviewed;  Constitutional: Well developed, Well nourished, Well hydrated, In no acute distress; Head:  Normocephalic, atraumatic; Eyes: EOMI, PERRL, No scleral icterus; ENMT: Mouth and pharynx normal, Mucous membranes moist; Neck: Supple, Full range of motion, No lymphadenopathy; Cardiovascular: Regular rate and rhythm, No gallop; Respiratory: Breath sounds clear &  equal bilaterally, No rales, rhonchi, wheezes.  Speaking full sentences with ease, Normal respiratory effort/excursion; Chest: Nontender, Movement normal; Abdomen: Soft, Nontender, Nondistended, Normal bowel sounds; Genitourinary: No CVA tenderness; Extremities: Pulses normal. Right shoulder w/FROM.  +generalized tenderness to palp anterior right  shoulder without abrasions, no ecchymosis, no deformity, no edema. Right clavicle NT, scapula NT, proximal humerus NT, biceps tendon NT over bicipital groove.  Motor strength at shoulder normal.  Sensation intact over deltoid region, distal NMS intact with right hand having intact and equal sensation and strength in the distribution of the median, radial, and ulnar nerve function compared to opposite side.  Strong radial pulse. RUE muscles compartments soft. NT right elbow/wrist/hand. +FROM right elbow with intact motor strength biceps and triceps muscles to resistance. No deformity. No edema, No calf edema or asymmetry.; Neuro: AA&Ox3, Major CN grossly intact.  Speech clear. No gross focal motor or sensory deficits in extremities.; Skin: Color normal, Warm, Dry.   ED Course  Procedures     EKG Interpretation   Date/Time:  Sunday September 22 2013 21:38:38 EDT Ventricular Rate:  84 PR Interval:  116 QRS Duration: 76 QT Interval:  342 QTC Calculation: 404 R Axis:   79 Text Interpretation:  Normal sinus rhythm Baseline wander Artifact  Abnormal ECG When compared with ECG of 07/17/2013 No significant change was  found Confirmed by [facility]  MD, Nicholos JohnsKATHLEEN [phone]([id number]) on 09/23/2013 12:05:12 AM      MDM  MDM Reviewed: previous chart, nursing note and vitals Reviewed previous: labs and ECG Interpretation: labs, ECG and x-ray    Results for orders placed during the hospital encounter of 09/22/13  URINALYSIS, ROUTINE W REFLEX MICROSCOPIC      Result Value Ref Range   Color, Urine YELLOW  YELLOW   APPearance CLOUDY (*) CLEAR   Specific Gravity, Urine 1.021  1.005 - 1.030   pH 6.5  5.0 - 8.0   Glucose, UA NEGATIVE  NEGATIVE mg/dL   Hgb urine dipstick NEGATIVE  NEGATIVE   Bilirubin Urine NEGATIVE  NEGATIVE   Ketones, ur NEGATIVE  NEGATIVE mg/dL   Protein, ur NEGATIVE  NEGATIVE mg/dL   Urobilinogen, UA 1.0  0.0 - 1.0 mg/dL   Nitrite NEGATIVE  NEGATIVE   Leukocytes, UA TRACE (*) NEGATIVE   PREGNANCY, URINE      Result Value Ref Range   Preg Test, Ur NEGATIVE  NEGATIVE  URINE MICROSCOPIC-ADD ON      Result Value Ref Range   Squamous Epithelial / LPF FEW (*) RARE   WBC, UA 3-6  <3 WBC/hpf   RBC / HPF 0-2  <3 RBC/hpf   Bacteria, UA MANY (*) RARE   Urine-Other MUCOUS PRESENT    I-STAT CHEM 8, ED      Result Value Ref Range   Sodium 143  137 - 147 mEq/L   Potassium 3.8  3.7 - 5.3 mEq/L   Chloride 105  96 - 112 mEq/L   BUN 8  6 - 23 mg/dL   Creatinine, Ser 6.210.90  0.50 - 1.10 mg/dL   Glucose, Bld 85  70 - 99 mg/dL   Calcium, Ion 3.081.25 (*) 1.12 - 1.23 mmol/L   TCO2 27  0 - 100 mmol/L   Hemoglobin 11.9 (*) 12.0 - 15.0 g/dL   HCT 65.735.0 (*) 84.636.0 - 96.246.0 %   Dg Shoulder Right 09/22/2013   CLINICAL DATA:  Loss of consciousness, trauma.  EXAM: RIGHT SHOULDER - 2+ VIEW  COMPARISON:  None.  FINDINGS: The humeral  head is well-formed and located. The subacromial, glenohumeral and acromioclavicular joint spaces are intact. No destructive bony lesions. Soft tissue planes are non-suspicious.  IMPRESSION: Negative.   Electronically Signed   By: Awilda Metro   On: 09/22/2013 22:56    0200:  Long hx of recurrent syncope with extensive evaluations at [facility]. Pt denies any change in her usual symptoms today. Pt has ambulated around the ED with steady gait, easy resps, NAD. Pt wants to go home now. Will tx for UTI with keflex. Dx and testing d/w pt.  Questions answered.  Verb understanding, agreeable to d/c home with outpt f/u.     Laray Anger, DO 09/25/13 1744

## 2013-12-25 ENCOUNTER — Encounter (HOSPITAL_COMMUNITY): Payer: Self-pay | Admitting: Emergency Medicine

## 2013-12-25 ENCOUNTER — Emergency Department (HOSPITAL_COMMUNITY): Payer: BLUE CROSS/BLUE SHIELD

## 2013-12-25 ENCOUNTER — Emergency Department (HOSPITAL_COMMUNITY)
Admission: EM | Admit: 2013-12-25 | Discharge: 2013-12-25 | Disposition: A | Discharge status: Home / Self Care | Payer: BLUE CROSS/BLUE SHIELD | Attending: Emergency Medicine | Admitting: Emergency Medicine

## 2013-12-25 DIAGNOSIS — Z8679 Personal history of other diseases of the circulatory system: Secondary | ICD-10-CM | POA: Insufficient documentation

## 2013-12-25 DIAGNOSIS — Y9289 Other specified places as the place of occurrence of the external cause: Secondary | ICD-10-CM | POA: Insufficient documentation

## 2013-12-25 DIAGNOSIS — R404 Transient alteration of awareness: Secondary | ICD-10-CM | POA: Insufficient documentation

## 2013-12-25 DIAGNOSIS — S0993XA Unspecified injury of face, initial encounter: Secondary | ICD-10-CM | POA: Insufficient documentation

## 2013-12-25 DIAGNOSIS — Z3202 Encounter for pregnancy test, result negative: Secondary | ICD-10-CM | POA: Diagnosis not present

## 2013-12-25 DIAGNOSIS — R296 Repeated falls: Secondary | ICD-10-CM | POA: Diagnosis not present

## 2013-12-25 DIAGNOSIS — R55 Syncope and collapse: Secondary | ICD-10-CM

## 2013-12-25 DIAGNOSIS — R51 Headache: Secondary | ICD-10-CM | POA: Insufficient documentation

## 2013-12-25 DIAGNOSIS — Y9389 Activity, other specified: Secondary | ICD-10-CM | POA: Insufficient documentation

## 2013-12-25 DIAGNOSIS — S199XXA Unspecified injury of neck, initial encounter: Secondary | ICD-10-CM

## 2013-12-25 DIAGNOSIS — IMO0002 Reserved for concepts with insufficient information to code with codable children: Secondary | ICD-10-CM | POA: Insufficient documentation

## 2013-12-25 LAB — CBC WITH DIFFERENTIAL/PLATELET
Basophils Absolute: 0 10*3/uL (ref 0.0–0.1)
Basophils Relative: 0 % (ref 0–1)
EOS PCT: 1 % (ref 0–5)
Eosinophils Absolute: 0.1 10*3/uL (ref 0.0–0.7)
HEMATOCRIT: 35 % — AB (ref 36.0–46.0)
HEMOGLOBIN: 11.5 g/dL — AB (ref 12.0–15.0)
LYMPHS PCT: 47 % — AB (ref 12–46)
Lymphs Abs: 2.4 10*3/uL (ref 0.7–4.0)
MCH: 28 pg (ref 26.0–34.0)
MCHC: 32.9 g/dL (ref 30.0–36.0)
MCV: 85.4 fL (ref 78.0–100.0)
MONO ABS: 0.3 10*3/uL (ref 0.1–1.0)
MONOS PCT: 6 % (ref 3–12)
NEUTROS ABS: 2.4 10*3/uL (ref 1.7–7.7)
Neutrophils Relative %: 46 % (ref 43–77)
Platelets: 275 10*3/uL (ref 150–400)
RBC: 4.1 MIL/uL (ref 3.87–5.11)
RDW: 11.8 % (ref 11.5–15.5)
WBC: 5.2 10*3/uL (ref 4.0–10.5)

## 2013-12-25 LAB — BASIC METABOLIC PANEL
Anion gap: 11 (ref 5–15)
BUN: 9 mg/dL (ref 6–23)
CALCIUM: 9.9 mg/dL (ref 8.4–10.5)
CHLORIDE: 106 meq/L (ref 96–112)
CO2: 26 meq/L (ref 19–32)
CREATININE: 0.77 mg/dL (ref 0.50–1.10)
GFR calc Af Amer: >90 mL/min (ref >90)
GFR calc non Af Amer: >90 mL/min (ref >90)
GLUCOSE: 83 mg/dL (ref 70–99)
Potassium: 4.3 mEq/L (ref 3.7–5.3)
Sodium: 143 mEq/L (ref 137–147)

## 2013-12-25 LAB — URINALYSIS, ROUTINE W REFLEX MICROSCOPIC
BILIRUBIN URINE: NEGATIVE
Glucose, UA: NEGATIVE mg/dL
Hgb urine dipstick: NEGATIVE
Ketones, ur: NEGATIVE mg/dL
Leukocytes, UA: NEGATIVE
Nitrite: NEGATIVE
PROTEIN: NEGATIVE mg/dL
SPECIFIC GRAVITY, URINE: 1.005 (ref 1.005–1.030)
UROBILINOGEN UA: 0.2 mg/dL (ref 0.0–1.0)
pH: 7.5 (ref 5.0–8.0)

## 2013-12-25 LAB — PREGNANCY, URINE: PREG TEST UR: NEGATIVE

## 2013-12-25 MED ORDER — NAPROXEN 250 MG PO TABS: 250.0000 mg | ORAL_TABLET | Freq: Two times a day (BID) | ORAL | Status: DC | PRN

## 2013-12-25 MED ORDER — ACETAMINOPHEN 500 MG PO TABS
1000.0000 mg | ORAL_TABLET | Freq: Once | ORAL | Status: AC
  Administered 2013-12-25: 1000 mg via ORAL
  Filled 2013-12-25: qty 2

## 2013-12-25 MED ORDER — ONDANSETRON 4 MG PO TBDP
4.0000 mg | ORAL_TABLET | Freq: Once | ORAL | Status: AC
  Administered 2013-12-25: 4 mg via ORAL
  Filled 2013-12-25: qty 1

## 2013-12-25 MED ORDER — SODIUM CHLORIDE 0.9 % IV BOLUS (SEPSIS)
1000.0000 mL | Freq: Once | INTRAVENOUS | Status: AC
  Administered 2013-12-25: 1000 mL via INTRAVENOUS

## 2013-12-25 MED ORDER — SODIUM CHLORIDE 0.9 % IV SOLN: INTRAVENOUS | Status: DC

## 2013-12-25 MED ORDER — IBUPROFEN 200 MG PO TABS
400.0000 mg | ORAL_TABLET | Freq: Once | ORAL | Status: AC
  Administered 2013-12-25: 400 mg via ORAL
  Filled 2013-12-25: qty 2

## 2013-12-25 NOTE — ED Provider Notes (Signed)
CSN: [id number]     Arrival date & time 12/25/13  1053 History   First MD Initiated Contact with Patient 12/25/13 1107     Chief Complaint  Patient presents with  . Loss of Consciousness     HPI Pt was seen at 1120. Per pt, c/o sudden onset and resolution of one episode of syncope that occurred at home PTA. Pt states she was talking to her mother's home heath aid when she "just passed out." States she woke up on the floor. Pt c/o head pain, right sided neck pain and right upper back pain. Pt describes her symptoms as her chronic recurrent syncope due to dx POTS. Endorses recurrent syncopal episodes such as this episode, several times per week. Pt states she is followed by her Cardiologist Dr. Alfonso Ellis at [facility]. Pt states she "feels fine otherwise." Denies recent medication changes. Denies CP/SOB, no abd pain, no N/V/D, no focal motor weakness, no tingling/numbness in extremities, no saddle anesthesia, no incont/retention of bowel or bladder. The symptoms have been associated with no other complaints. The patient has a significant history of similar symptoms previously, recently being evaluated for this complaint and multiple prior evals for same.      Past Medical History  Diagnosis Date  . WPW (Wolff-Parkinson-White syndrome)     s/p ablation (2011)  . Syncope     recurrent  . Vasovagal syncope   . POTS (postural orthostatic tachycardia syndrome)   . Palpitations     daily  . Hx of echocardiogram 04/2012    normal  . History of Holter monitoring 06/2013    "NSR, sinus tach, sinus arrythmia, one PVC, one PAC"  . Tilt table evaluation 02/2011    dx neurocardiogenic syncope   Past Surgical History  Procedure Laterality Date  . Cardiac electrophysiology study and ablation  2011    Dr. Gevena Cotton at [facility]    History  Substance Use Topics  . Smoking status: Never Smoker   . Smokeless tobacco: Not on file  . Alcohol Use: No    Review of Systems ROS: Statement: All systems  negative except as marked or noted in the HPI; Constitutional: Negative for fever and chills. ; ; Eyes: Negative for eye pain, redness and discharge. ; ; ENMT: Negative for ear pain, hoarseness, nasal congestion, sinus pressure and sore throat. ; ; Cardiovascular: Negative for chest pain, palpitations, diaphoresis, dyspnea and peripheral edema. ; ; Respiratory: Negative for cough, wheezing and stridor. ; ; Gastrointestinal: Negative for nausea, vomiting, diarrhea, abdominal pain, blood in stool, hematemesis, jaundice and rectal bleeding. . ; ; Genitourinary: Negative for dysuria, flank pain and hematuria. ; ; Musculoskeletal: +back pain and neck pain. Negative for swelling and deformity.; ; Skin: Negative for pruritus, rash, abrasions, blisters, bruising and skin lesion.; ; Neuro: Negative for headache, lightheadedness and neck stiffness. Negative for weakness, extremity weakness, paresthesias, involuntary movement, seizure and +syncope.      Allergies  Amitiza  Home Medications   Prior to Admission medications   Not on File   BP 121/79  Temp(Src) 99 F (37.2 C) (Oral)  Resp 22  SpO2 100%  LMP 12/17/2013 Physical Exam 1125: Physical examination: Vital signs and O2 SAT: Reviewed; Constitutional: Well developed, Well nourished, Well hydrated, In no acute distress; Head and Face: Normocephalic, Atraumatic; Eyes: EOMI, PERRL, No scleral icterus; ENMT: Mouth and pharynx normal, Left TM normal, Right TM normal, Mucous membranes moist; Neck: Immobilized in C-collar, Trachea midline; Spine: +TTP right hypertonic trapezius muscle, +TTP right  upper thoracic paraspinal muscles. No midline CS, TS, LS tenderness.; Cardiovascular: Regular rate and rhythm, No murmur, rub, or gallop; Respiratory: Breath sounds clear & equal bilaterally, No rales, rhonchi, wheezes, Normal respiratory effort/excursion; Chest: Nontender, No deformity, Movement normal, No crepitus, No abrasions or ecchymosis.; Abdomen: Soft,  Nontender, Nondistended, Normal bowel sounds, No abrasions or ecchymosis.; Genitourinary: No CVA tenderness;; Extremities: No deformity, Full range of motion major/large joints of bilat UE's and LE's without pain or tenderness to palp, Neurovascularly intact, Pulses normal, No tenderness, No edema, Pelvis stable; Neuro: AA&Ox3, GCS 15.  Major CN grossly intact. Speech clear. No gross focal motor or sensory deficits in extremities.; Skin: Color normal, Warm, Dry   ED Course  Procedures     EKG Interpretation   Date/Time:  Wednesday December 25 2013 10:57:37 EDT Ventricular Rate:  90 PR Interval:  123 QRS Duration: 80 QT Interval:  350 QTC Calculation: 428 R Axis:   93 Text Interpretation:  Sinus rhythm Borderline right axis deviation When  compared with ECG of 09/22/2013 No significant change was found Confirmed  by [facility]  MD, Nicholos Johns [phone]) on 12/25/2013 11:10:29 AM      MDM  MDM Reviewed: previous chart, nursing note and vitals Reviewed previous: labs and ECG Interpretation: labs, ECG, x-ray and CT scan    Results for orders placed during the hospital encounter of 12/25/13  URINALYSIS, ROUTINE W REFLEX MICROSCOPIC      Result Value Ref Range   Color, Urine YELLOW  YELLOW   APPearance CLEAR  CLEAR   Specific Gravity, Urine 1.005  1.005 - 1.030   pH 7.5  5.0 - 8.0   Glucose, UA NEGATIVE  NEGATIVE mg/dL   Hgb urine dipstick NEGATIVE  NEGATIVE   Bilirubin Urine NEGATIVE  NEGATIVE   Ketones, ur NEGATIVE  NEGATIVE mg/dL   Protein, ur NEGATIVE  NEGATIVE mg/dL   Urobilinogen, UA 0.2  0.0 - 1.0 mg/dL   Nitrite NEGATIVE  NEGATIVE   Leukocytes, UA NEGATIVE  NEGATIVE  PREGNANCY, URINE      Result Value Ref Range   Preg Test, Ur NEGATIVE  NEGATIVE  CBC WITH DIFFERENTIAL      Result Value Ref Range   WBC 5.2  4.0 - 10.5 K/uL   RBC 4.10  3.87 - 5.11 MIL/uL   Hemoglobin 11.5 (*) 12.0 - 15.0 g/dL   HCT 41.3 (*) 24.4 - 01.0 %   MCV 85.4  78.0 - 100.0 fL   MCH 28.0  26.0 -  34.0 pg   MCHC 32.9  30.0 - 36.0 g/dL   RDW 27.2  53.6 - 64.4 %   Platelets 275  150 - 400 K/uL   Neutrophils Relative % 46  43 - 77 %   Neutro Abs 2.4  1.7 - 7.7 K/uL   Lymphocytes Relative 47 (*) 12 - 46 %   Lymphs Abs 2.4  0.7 - 4.0 K/uL   Monocytes Relative 6  3 - 12 %   Monocytes Absolute 0.3  0.1 - 1.0 K/uL   Eosinophils Relative 1  0 - 5 %   Eosinophils Absolute 0.1  0.0 - 0.7 K/uL   Basophils Relative 0  0 - 1 %   Basophils Absolute 0.0  0.0 - 0.1 K/uL  BASIC METABOLIC PANEL      Result Value Ref Range   Sodium 143  137 - 147 mEq/L   Potassium 4.3  3.7 - 5.3 mEq/L   Chloride 106  96 - 112 mEq/L  CO2 26  19 - 32 mEq/L   Glucose, Bld 83  70 - 99 mg/dL   BUN 9  6 - 23 mg/dL   Creatinine, Ser 4.09  0.50 - 1.10 mg/dL   Calcium 9.9  8.4 - 81.1 mg/dL   GFR calc non Af Amer >90  >90 mL/min   GFR calc Af Amer >90  >90 mL/min   Anion gap 11  5 - 15   Dg Chest 2 View 12/25/2013   CLINICAL DATA:  Loss of consciousness  EXAM: CHEST  2 VIEW  COMPARISON:  07/17/2013  FINDINGS: Cardiomediastinal silhouette is stable. No acute infiltrate or pleural effusion. No pulmonary edema. Bony thorax is unremarkable.  IMPRESSION: No active cardiopulmonary disease.   Electronically Signed   By: Natasha Mead M.D.   On: 12/25/2013 12:29   Dg Lumbar Spine Complete 12/25/2013   CLINICAL DATA:  Fall  EXAM: LUMBAR SPINE - COMPLETE 4+ VIEW  COMPARISON:  02/07/2013  FINDINGS: Five views of the lumbar spine submitted. No acute fracture or subluxation. Alignment, disc spaces and vertebral heights are preserved.  IMPRESSION: Negative.   Electronically Signed   By: Natasha Mead M.D.   On: 12/25/2013 12:29   Ct Head Wo Contrast 12/25/2013   CLINICAL DATA:  Fall with loss of consciousness/syncope  EXAM: CT HEAD WITHOUT CONTRAST  CT CERVICAL SPINE WITHOUT CONTRAST  TECHNIQUE: Multidetector CT imaging of the head and cervical spine was performed following the standard protocol without intravenous contrast. Multiplanar CT  image reconstructions of the cervical spine were also generated.  COMPARISON:  July 17, 2013  FINDINGS: CT HEAD FINDINGS  The ventricles are normal in size and configuration. There is no mass, hemorrhage, extra-axial fluid collection, or midline shift. Gray-white compartments are normal. No acute infarct apparent. Bony calvarium appears intact. The mastoid air cells are clear.  CT CERVICAL SPINE FINDINGS  There is no fracture or spondylolisthesis. Prevertebral soft tissues and predental space regions are normal. Disc spaces appear intact. No nerve root edema or effacement. No disc extrusion or stenosis.  IMPRESSION: CT head:  Study within normal limits.  CT cervical spine: No fracture or spondylolisthesis. No appreciable arthropathic change.   Electronically Signed   By: Bretta Bang M.D.   On: 12/25/2013 12:23   Ct Cervical Spine Wo Contrast 12/25/2013   CLINICAL DATA:  Fall with loss of consciousness/syncope  EXAM: CT HEAD WITHOUT CONTRAST  CT CERVICAL SPINE WITHOUT CONTRAST  TECHNIQUE: Multidetector CT imaging of the head and cervical spine was performed following the standard protocol without intravenous contrast. Multiplanar CT image reconstructions of the cervical spine were also generated.  COMPARISON:  July 17, 2013  FINDINGS: CT HEAD FINDINGS  The ventricles are normal in size and configuration. There is no mass, hemorrhage, extra-axial fluid collection, or midline shift. Gray-white compartments are normal. No acute infarct apparent. Bony calvarium appears intact. The mastoid air cells are clear.  CT CERVICAL SPINE FINDINGS  There is no fracture or spondylolisthesis. Prevertebral soft tissues and predental space regions are normal. Disc spaces appear intact. No nerve root edema or effacement. No disc extrusion or stenosis.  IMPRESSION: CT head:  Study within normal limits.  CT cervical spine: No fracture or spondylolisthesis. No appreciable arthropathic change.   Electronically Signed   By: Bretta Bang M.D.   On: 12/25/2013 12:23    1430:  Pt has significant hx of recurrent syncope due to POTS. IVF given. Pt is not orthostatic during VS. Pt has ambulated  with steady gait, easy resps. Neuro exam remains intact and unchanged. Pt states she is ready to go home now. Dx and testing d/w pt and family.  Questions answered.  Verb understanding, agreeable to d/c home with outpt f/u.   Samuel Jester, DO 12/27/13 2150

## 2013-12-25 NOTE — Discharge Instructions (Signed)
°Emergency Department Resource Guide °1) Find a Doctor and Pay Out of Pocket °Although you won't have to find out who is covered by your insurance plan, it is a good idea to ask around and get recommendations. You will then need to call the office and see if the doctor you have chosen will accept you as a new patient and what types of options they offer for patients who are self-pay. Some doctors offer discounts or will set up payment plans for their patients who do not have insurance, but you will need to ask so you aren't surprised when you get to your appointment. ° °2) Contact Your Local Health Department °Not all health departments have doctors that can see patients for sick visits, but many do, so it is worth a call to see if yours does. If you don't know where your local health department is, you can check in your phone book. The CDC also has a tool to help you locate your state's health department, and many state websites also have listings of all of their local health departments. ° °3) Find a Walk-in Clinic °If your illness is not likely to be very severe or complicated, you may want to try a walk in clinic. These are popping up all over the country in pharmacies, drugstores, and shopping centers. They're usually staffed by nurse practitioners or physician assistants that have been trained to treat common illnesses and complaints. They're usually fairly quick and inexpensive. However, if you have serious medical issues or chronic medical problems, these are probably not your best option. ° °No Primary Care Doctor: °- Call [facility] at  832-8000 - they can help you locate a primary care doctor that  accepts your insurance, provides certain services, etc. °- Physician Referral Service- 1-800-533-3463 ° °Chronic Pain Problems: °Organization         Address  Phone   Notes  °[facility]  (336) 297-2271 Patients need to be referred by their primary care doctor.  ° °Medication  Assistance: °Organization         Address  Phone   Notes  °[place] [facility] [address] °[place], [place] [address] (336) 641-8030 --Must be a resident of [place] °-- Must have NO insurance coverage whatsoever (no Medicaid/ Medicare, etc.) °-- The pt. MUST have a primary care doctor that directs their care regularly and follows them in the community °  °[facility]  (866) 331-1348   °[employer]  (888) 892-1162   ° °Agencies that provide inexpensive medical care: °Organization         Address  Phone   Notes  °[facility]  (336) 832-8035   °[facility]    (336) 832-7272   °[facility] [address] °[place], [place] [address] (336) 832-4777   °[facility] [address], °[place] (336) 271-4999   °[facility]    (336) 373-0678   °[facility]    (336) 272-1050   °[facility] ° [address], [place] Phone:  (336) 832-4444, Fax:  (336) 832-4440 Hours of Operation:  9 am - 6 pm, M-F.  Also accepts Medicaid/Medicare and self-pay.  °[facility] ° [address], [place] Phone: (336) 832-3150, Fax: (336) [phone]. Hours of Operation:  8:30 am - 5:30 pm, M-F.  Also accepts Medicaid and self-pay.  °[facility] [place] [address], [place] Phone: (336) 878-6027   °[facility] [address], [place], [place] (336)723-1848, Ext. 123 Mondays & Thursdays: 7-9 AM.  First 15 patients are seen on a first come, first serve basis. °  ° °Medicaid-accepting [place] Providers: ° °Organization         Address  Phone   Notes  °[facility] [address], [place] (336) 641-2100 Also accepts self-pay patients.  °[facility] [address], [place] ° (336) 856-9996   °[facility] [address], [place]  (336) 288-8857   °[facility] [address] [place] [address], [place] (336) 299-7000   °Veita Bland [address], [place]  ° (336) 373-1557 Only accepts [place] Access Medicaid patients after they have their name applied to their card.  ° °Self-Pay (no insurance) in [place]: ° °Organization         Address  Phone   Notes  °Sickle Cell Patients, [facility] [address], [place] (336) 832-1970   °[facility] [address], [place] (336) 832-4400   °[facility] ° [address], [place] (336) 992-4800   °[facility]/Dr. Osei-Bonsu ° [address] [place] [address], [place] or [address], [place] (336) [phone] Phone number for both [place] and [place] locations is the same.  °[facility] [address], [place] (336) 299-0000   °[facility] [address] [place] [address], [place] or [address] (336) 852-7530 °(336) 878-2260   °[facility] [address], [place] (336) 350-1642, phone; (336) 294-5005, fax Sees patients 1st and 3rd Saturday of every month.  Must not qualify for public or private insurance (i.e. Medicaid, Medicare, [place] Health Choice, Veterans' Benefits) • Household income should be no more than 200% of the poverty level •The clinic cannot treat you if you are pregnant or think you are pregnant • Sexually transmitted diseases are not treated at the clinic.  ° ° °Dental Care: °Organization         Address  Phone  Notes  °[place] [facility] [address], [place] (336) 641-6152 Accepts children up to age 21 who are enrolled in Medicaid or [place] Health Choice; pregnant women with a Medicaid card; and children who have applied for Medicaid or [place] Health Choice, but were declined, whose parents can pay a reduced fee at time of service.  °[place]  [facility] [place]  [address], [place] (336) 641-7733 Accepts children up to age 21 who are enrolled in Medicaid or [place] Health Choice; pregnant women with a Medicaid card; and children who have applied for Medicaid or [place] Health Choice, but were declined, whose parents can pay a reduced fee at time of service.  °[facility] ° [address], [place] (336) 641-4533 Patients are seen by appointment only. Walk-ins are not accepted. [facility] will see patients 18 years of age and older. °Monday - Tuesday (8am-5pm) °Most Wednesdays (8:30-5pm) °$30 per visit, cash only  °[facility] ° [address], [place] (336) 641-4533 Patients are seen by appointment only. Walk-ins are not accepted. [facility] will see patients 18 years of age and older. °One   Wednesday Evening (Monthly: Volunteer Based).  $30 per visit, cash only  °[facility]  (919) 537-3737 for adults; Children under age 4, call Graduate Pediatric Dentistry at (919) [phone]. Children aged 4-14, please call (919) [phone] to request a pediatric application. ° Dental services are provided in all areas of dental care including fillings, crowns and bridges, complete and partial dentures, implants, gum treatment, root canals, and extractions. Preventive care is also provided. Treatment is provided to both adults and children. °Patients are selected via a lottery and there is often a waiting list. °  °[facility] [address], °[place] ° (336) 763-8833 [email or web address] °  °[facility] [address], [place], [place] (336)723-1848, Ext. 123 Second and Fourth Thursday of each month, opens at 6:30 AM; Clinic ends at 9 AM.  Patients are seen on a first-come first-served basis, and a limited number are seen during each clinic.  ° °[facility] ° [address], [place], [place] (336) 723-7904    Eligibility Requirements °You must have lived in [place], or [place] counties for at least the last three months. °  You cannot be eligible for state or federal sponsored healthcare insurance, including Veterans Administration, Medicaid, or Medicare. °  You generally cannot be eligible for healthcare insurance through your employer.  °  How to apply: °Eligibility screenings are held every Tuesday and Wednesday afternoon from 1:00 pm until 4:00 pm. You do not need an appointment for the interview!  °[facility] [address], [place], [place] 336-631-2330   °[facility]  336-342-8273   °[facility]  336-703-3100   °[facility]  336-570-6415   ° °Behavioral Health Resources in the Community: °Intensive Outpatient Programs °Organization         Address  Phone  Notes  °[facility] [address], [place], [place] 336-878-6098   °[facility] [address], [place], [place] 336-832-9800   °[facility] [address], [place], [place] ° 336-882-2125   °[facility] [address],  °[place], [place] 1-800-853-5163 or 336-641-4981   °Substance Abuse Resources °Organization         Address  Phone  Notes  °[facility]  336-882-2125   °[facility]  336-784-9470   °[facility]  336-285-9073   °[facility]  336-845-3988   °Residential & Outpatient Substance Abuse Program  1-800-659-3381   °Psychological Services °Organization         Address  Phone  Notes  °[facility]  336- 832-9600   °[facility]  336- 378-7881   °[facility] [address], [place] 1-800-853-5163 or 336-641-4981   ° °Mobile Crisis Teams °Organization         Address  Phone  Notes  °[facility], Mobile Crisis Care Unit  1-877-626-1772   °Assertive °[facility] ° [address]  [place], [place] 336-834-9664   °Sharon DeEsch [address] °[place] 336-554-5454   ° °Self-Help/Support Groups °Organization         Address  Phone             Notes  °[facility]  - variety of support groups  336- 373-1402 Call for more information  °Narcotics Anonymous (NA), [facility] [address], °[place]  2 meetings at this location  ° °  Residential Treatment Programs Organization         Address  Phone  Notes  [facility] [address],    [place]  1-610-960-4540   [facility]  [address] [place] [address], [place], [place] [phone]   [facility] [address] [place], [place] [phone] Admissions: 8am-3pm M-F  [facility] [address]    [place], [place] 295-284-1324   [facility] [address] [place], [place], [place] 401-027-2536   [facility] [address],  [place], [place] [phone]   [facility] Intensive Outpatient [address], [place], [place] [phone]   [facility] [address] [place]  [place], [place] 3-295-188-4166 or 920-561-3034   [facility] ([facility] [address], [place], [place] 323-557-3220 Accepts Medicaid  [facility] [address],  [place] 2-542-706-2376 Substance Abuse/Addiction Treatment   [facility]         Address  Phone  Notes  [facility]  (925) 057-8532   Angie Fava, PhD [address] [place]vin Knack [place], [place]   215 484 1462 or 704-827-0479   [facility]   [address] [place], [place] 714-495-1874   [facility] [address], [place], [place] (479)685-8556 Insurance/Medicaid/sponsorship through [facility] [address]                                    [place], [place] (507)704-2092 Therapy/tele-psych/case    [facility] [address], [place] [phone]    Dr. Lolly Mustache  [phone]   [facility]  [employer] [facility] Dept. 1) [address] [place] 2) [address] [place] 3)  [address], [place] [phone]  (682)[phone]   [facility] Child Abuse Hotline (873) 694-1680 or 209 293 8042 (After Hours)       Take the prescription as directed.  Apply moist heat or ice to the area(s) of discomfort, for 15 minutes at a time, several times per day for the next few days.  Do not fall asleep on a heating or ice pack.  Call your regular Cardiologist at [facility] today to schedule a follow up appointment this week.  Return to the Emergency Department immediately if worsening.

## 2013-12-25 NOTE — ED Notes (Signed)
Ambulated pt up and down the hallway while standing by her side. Pt stated that she felt dizzy while walking and her legs felt weak. Pt's gait was normal.

## 2013-12-25 NOTE — ED Notes (Signed)
23 yo female with syncopal episode and unresponsive. Per [facility] pt had un witnessed fall in living room. When EMS arrived pt was supine, conscious and confused. Pt now Alert and Oriented c/o Head ache frontal, Neck pain and Thoracic pain. Hx of WPW last flare up a month ago. Vitals stable, placed in LSB, Head block and C-collar.

## 2013-12-25 NOTE — ED Notes (Signed)
Pt taken to CT and Xray

## 2013-12-25 NOTE — ED Notes (Signed)
Spine cleared. EDP

## 2013-12-29 ENCOUNTER — Emergency Department (HOSPITAL_COMMUNITY)
Admission: EM | Admit: 2013-12-29 | Discharge: 2013-12-29 | Disposition: A | Discharge status: Home / Self Care | Payer: BLUE CROSS/BLUE SHIELD | Attending: Emergency Medicine | Admitting: Emergency Medicine

## 2013-12-29 ENCOUNTER — Emergency Department (HOSPITAL_COMMUNITY): Payer: BLUE CROSS/BLUE SHIELD

## 2013-12-29 ENCOUNTER — Encounter (HOSPITAL_COMMUNITY): Payer: Self-pay | Admitting: Emergency Medicine

## 2013-12-29 DIAGNOSIS — Z9889 Other specified postprocedural states: Secondary | ICD-10-CM | POA: Insufficient documentation

## 2013-12-29 DIAGNOSIS — Z79899 Other long term (current) drug therapy: Secondary | ICD-10-CM | POA: Diagnosis not present

## 2013-12-29 DIAGNOSIS — R55 Syncope and collapse: Secondary | ICD-10-CM | POA: Diagnosis not present

## 2013-12-29 DIAGNOSIS — R002 Palpitations: Secondary | ICD-10-CM | POA: Diagnosis not present

## 2013-12-29 DIAGNOSIS — I456 Pre-excitation syndrome: Secondary | ICD-10-CM | POA: Diagnosis not present

## 2013-12-29 DIAGNOSIS — R404 Transient alteration of awareness: Secondary | ICD-10-CM | POA: Diagnosis present

## 2013-12-29 DIAGNOSIS — R51 Headache: Secondary | ICD-10-CM | POA: Diagnosis not present

## 2013-12-29 LAB — CBC WITH DIFFERENTIAL/PLATELET
BASOS ABS: 0 10*3/uL (ref 0.0–0.1)
BASOS PCT: 0 % (ref 0–1)
EOS ABS: 0.1 10*3/uL (ref 0.0–0.7)
Eosinophils Relative: 1 % (ref 0–5)
HCT: 31.3 % — ABNORMAL LOW (ref 36.0–46.0)
HEMOGLOBIN: 10.3 g/dL — AB (ref 12.0–15.0)
Lymphocytes Relative: 46 % (ref 12–46)
Lymphs Abs: 3 10*3/uL (ref 0.7–4.0)
MCH: 28.9 pg (ref 26.0–34.0)
MCHC: 32.9 g/dL (ref 30.0–36.0)
MCV: 87.9 fL (ref 78.0–100.0)
MONOS PCT: 7 % (ref 3–12)
Monocytes Absolute: 0.5 10*3/uL (ref 0.1–1.0)
NEUTROS ABS: 2.9 10*3/uL (ref 1.7–7.7)
Neutrophils Relative %: 46 % (ref 43–77)
Platelets: 235 10*3/uL (ref 150–400)
RBC: 3.56 MIL/uL — ABNORMAL LOW (ref 3.87–5.11)
RDW: 11.8 % (ref 11.5–15.5)
WBC: 6.4 10*3/uL (ref 4.0–10.5)

## 2013-12-29 LAB — BASIC METABOLIC PANEL
Anion gap: 11 (ref 5–15)
BUN: 12 mg/dL (ref 6–23)
CALCIUM: 9 mg/dL (ref 8.4–10.5)
CHLORIDE: 105 meq/L (ref 96–112)
CO2: 26 mEq/L (ref 19–32)
Creatinine, Ser: 0.74 mg/dL (ref 0.50–1.10)
GFR calc non Af Amer: >90 mL/min (ref >90)
Glucose, Bld: 78 mg/dL (ref 70–99)
Potassium: 4.2 mEq/L (ref 3.7–5.3)
Sodium: 142 mEq/L (ref 137–147)

## 2013-12-29 LAB — HCG, QUANTITATIVE, PREGNANCY: hCG, Beta Chain, Quant, S: <1 m[IU]/mL (ref <5)

## 2013-12-29 MED ORDER — METOCLOPRAMIDE HCL 5 MG/ML IJ SOLN
10.0000 mg | Freq: Once | INTRAMUSCULAR | Status: AC
  Administered 2013-12-29: 10 mg via INTRAVENOUS
  Filled 2013-12-29 (×2): qty 2

## 2013-12-29 MED ORDER — KETOROLAC TROMETHAMINE 30 MG/ML IJ SOLN
30.0000 mg | Freq: Once | INTRAMUSCULAR | Status: AC
  Administered 2013-12-29: 30 mg via INTRAVENOUS
  Filled 2013-12-29: qty 1

## 2013-12-29 MED ORDER — DIPHENHYDRAMINE HCL 50 MG/ML IJ SOLN
25.0000 mg | Freq: Once | INTRAMUSCULAR | Status: AC
  Administered 2013-12-29: 25 mg via INTRAVENOUS
  Filled 2013-12-29: qty 1

## 2013-12-29 MED ORDER — SODIUM CHLORIDE 0.9 % IV BOLUS (SEPSIS)
1000.0000 mL | Freq: Once | INTRAVENOUS | Status: AC
  Administered 2013-12-29: 1000 mL via INTRAVENOUS

## 2013-12-29 NOTE — ED Notes (Signed)
Pt to discharge via wheelchair- escorted by RN.  Pt remains in waiting room for family to pick her up.

## 2013-12-29 NOTE — ED Notes (Signed)
Pt walking down hallway experienced sudden syncopal episode and fell face first to the floor. Pt reports 2nd syncopal episode this over the course of two weeks. Seen two weeks ago her at [facility] was told that the initial syncopal episode was the result of a vasovagal response. Pt reports neck pain. Denies back pain. CBG 93, BP 140/90. Pt was unable to recall the day of the week and current president upon EMS initial assessment.  Pt denies epilepsy history.

## 2013-12-29 NOTE — ED Provider Notes (Signed)
CSN: [id number]     Arrival date & time 12/29/13  1710 History   First MD Initiated Contact with Patient 12/29/13 1820     Chief Complaint  Patient presents with  . Loss of Consciousness     (Consider location/radiation/quality/duration/timing/severity/associated sxs/prior Treatment) Patient is a 23 y.o. female presenting with syncope.  Loss of Consciousness Episode history:  Single Most recent episode:  Today Duration:  5 seconds Timing:  Constant Progression:  Resolved Chronicity:  Recurrent Context comment:  Walking Witnessed: yes   Relieved by:  None tried Worsened by:  Nothing tried Ineffective treatments:  None tried Associated symptoms: headaches, palpitations and recent fall   Associated symptoms: no chest pain, no fever, no nausea, no shortness of breath and no vomiting   Associated symptoms comment:  Fast heart rate Risk factors comment:  Wpw   Past Medical History  Diagnosis Date  . WPW (Wolff-Parkinson-White syndrome)     s/p ablation (2011)  . Syncope     recurrent  . Vasovagal syncope   . POTS (postural orthostatic tachycardia syndrome)   . Palpitations     daily  . Hx of echocardiogram 04/2012    normal  . History of Holter monitoring 06/2013    "NSR, sinus tach, sinus arrythmia, one PVC, one PAC"  . Tilt table evaluation 02/2011    dx neurocardiogenic syncope   Past Surgical History  Procedure Laterality Date  . Cardiac electrophysiology study and ablation  2011    Dr. Gevena [facility]   No family history on file. History  Substance Use Topics  . Smoking status: Never Smoker   . Smokeless tobacco: Not on file  . Alcohol Use: No   OB History   Grav Para Term Preterm Abortions TAB SAB Ect Mult Living                 Review of Systems  Constitutional: Negative for fever and chills.  HENT: Negative for congestion and sore throat.   Eyes: Negative for visual disturbance.  Respiratory: Negative for cough, shortness of breath and wheezing.    Cardiovascular: Positive for palpitations and syncope. Negative for chest pain.  Gastrointestinal: Negative for nausea, vomiting, abdominal pain, diarrhea and constipation.  Genitourinary: Negative for dysuria, difficulty urinating and vaginal pain.  Musculoskeletal: Negative for arthralgias and myalgias.  Skin: Negative for rash.  Neurological: Positive for syncope and headaches.  Psychiatric/Behavioral: Negative for behavioral problems.  All other systems reviewed and are negative.     Allergies  Amitiza  Home Medications   Prior to Admission medications   Medication Sig Start Date End Date Taking? Authorizing Provider  naproxen (NAPROSYN) 250 MG tablet Take 1 tablet (250 mg total) by mouth 2 (two) times daily as needed for mild pain or moderate pain (take with food). 12/25/13  Yes Samuel Jester, DO  sodium chloride 1 G tablet Take 1 g by mouth 2 (two) times daily.   Yes Historical Provider, MD   BP 108/71  Pulse 60  Temp(Src) 98.1 F (36.7 C) (Oral)  Resp 21  Ht  (1.6 m)  Wt 142 lb (64.411 kg)  BMI 25.16 kg/m2  SpO2 99%  LMP 12/17/2013 Physical Exam  Vitals reviewed. Constitutional: She is oriented to person, place, and time. She appears well-developed and well-nourished. No distress. Cervical collar in place.  HENT:  Head: Normocephalic and atraumatic.  Eyes: EOM are normal.  Neck: Normal range of motion.  Cardiovascular: Normal rate, regular rhythm and normal heart sounds.  No murmur heard. Pulmonary/Chest: Effort normal and breath sounds normal. No respiratory distress. She has no wheezes.  Abdominal: Soft. There is no tenderness.  Musculoskeletal: She exhibits no edema.       Cervical back: She exhibits bony tenderness.  Neurological: She is alert and oriented to person, place, and time. She has normal strength and normal reflexes. No cranial nerve deficit or sensory deficit. Coordination normal. GCS eye subscore is 4. GCS verbal subscore is 5. GCS motor  subscore is 6.  Skin: She is not diaphoretic.  Psychiatric: She has a normal mood and affect. Her behavior is normal.    ED Course  Procedures (including critical care time) Labs Review Labs Reviewed  CBC WITH DIFFERENTIAL - Abnormal; Notable for the following:    RBC 3.56 (*)    Hemoglobin 10.3 (*)    HCT 31.3 (*)    All other components within normal limits  HCG, QUANTITATIVE, PREGNANCY  BASIC METABOLIC PANEL    Imaging Review Ct Head Wo Contrast  12/29/2013   CLINICAL DATA:  Syncope, facial injury, neck pain  EXAM: CT HEAD WITHOUT CONTRAST  CT CERVICAL SPINE WITHOUT CONTRAST  TECHNIQUE: Multidetector CT imaging of the head and cervical spine was performed following the standard protocol without intravenous contrast. Multiplanar CT image reconstructions of the cervical spine were also generated.  COMPARISON:  12/25/2013  FINDINGS: CT HEAD FINDINGS  No evidence of parenchymal hemorrhage or extra-axial fluid collection. No mass lesion, mass effect, or midline shift.  No CT evidence of acute infarction.  Cerebral volume is within normal limits.  No ventriculomegaly.  The visualized paranasal sinuses are essentially clear. The mastoid air cells are unopacified.  No evidence of calvarial fracture.  CT CERVICAL SPINE FINDINGS  Reversal of the normal cervical lordosis.  No evidence fracture or dislocation. Vertebral body heights and intervertebral disc spaces are maintained. Dens appears intact.  No prevertebral soft tissue swelling.  Visualized thyroid is unremarkable.  Visualized lung apices are clear.  IMPRESSION: Normal head CT.  Normal cervical spine CT.   Electronically Signed   By: Charline Bills M.D.   On: 12/29/2013 18:55   Ct Cervical Spine Wo Contrast  12/29/2013   CLINICAL DATA:  Syncope, facial injury, neck pain  EXAM: CT HEAD WITHOUT CONTRAST  CT CERVICAL SPINE WITHOUT CONTRAST  TECHNIQUE: Multidetector CT imaging of the head and cervical spine was performed following the standard  protocol without intravenous contrast. Multiplanar CT image reconstructions of the cervical spine were also generated.  COMPARISON:  12/25/2013  FINDINGS: CT HEAD FINDINGS  No evidence of parenchymal hemorrhage or extra-axial fluid collection. No mass lesion, mass effect, or midline shift.  No CT evidence of acute infarction.  Cerebral volume is within normal limits.  No ventriculomegaly.  The visualized paranasal sinuses are essentially clear. The mastoid air cells are unopacified.  No evidence of calvarial fracture.  CT CERVICAL SPINE FINDINGS  Reversal of the normal cervical lordosis.  No evidence fracture or dislocation. Vertebral body heights and intervertebral disc spaces are maintained. Dens appears intact.  No prevertebral soft tissue swelling.  Visualized thyroid is unremarkable.  Visualized lung apices are clear.  IMPRESSION: Normal head CT.  Normal cervical spine CT.   Electronically Signed   By: Charline Bills M.D.   On: 12/29/2013 18:55     EKG Interpretation None      MDM   Final diagnoses:  WOLFF-PARKINSON-WHITE (WPW) SYNDROME status post ablation  Syncope and collapse    Pt has h/o  WPW and recurrent syncopal episodes s/p ablation in 2011 w/o improvement.  Pt follows cardiology at [facility] in [place].  Pt had similar episode 2 weeks ago.  Pt fell on her face today and has head and neck pain.  No focal neuro findings. TTP around C6-C7. Will CT. Pt's EKG is NSR w/o WPW at this time w/ rate of 83. Pt's bmp wnl.   CT wnl. I spoke with cardiology at [facility] and pt to be seen asap and will be called with appointment.  Pt advised not to drive and return precautions given.    Beverely Risen, MD 12/30/13 [id number]

## 2013-12-29 NOTE — Discharge Instructions (Signed)
Syncope °Syncope is a medical term for fainting or passing out. This means you lose consciousness and drop to the ground. People are generally unconscious for less than 5 minutes. You may have some muscle twitches for up to 15 seconds before waking up and returning to normal. Syncope occurs more often in older adults, but it can happen to anyone. While most causes of syncope are not dangerous, syncope can be a sign of a serious medical problem. It is important to seek medical care.  °CAUSES  °Syncope is caused by a sudden drop in blood flow to the brain. The specific cause is often not determined. Factors that can bring on syncope include: °· Taking medicines that lower blood pressure. °· Sudden changes in posture, such as standing up quickly. °· Taking more medicine than prescribed. °· Standing in one place for too long. °· Seizure disorders. °· Dehydration and excessive exposure to heat. °· Low blood sugar (hypoglycemia). °· Straining to have a bowel movement. °· Heart disease, irregular heartbeat, or other circulatory problems. °· Fear, emotional distress, seeing blood, or severe pain. °SYMPTOMS  °Right before fainting, you may: °· Feel dizzy or light-headed. °· Feel nauseous. °· See all white or all black in your field of vision. °· Have cold, clammy skin. °DIAGNOSIS  °Your health care provider will ask about your symptoms, perform a physical exam, and perform an electrocardiogram (ECG) to record the electrical activity of your heart. Your health care provider may also perform other heart or blood tests to determine the cause of your syncope which may include: °· Transthoracic echocardiogram (TTE). During echocardiography, sound waves are used to evaluate how blood flows through your heart. °· Transesophageal echocardiogram (TEE). °· Cardiac monitoring. This allows your health care provider to monitor your heart rate and rhythm in real time. °· Holter monitor. This is a portable device that records your  heartbeat and can help diagnose heart arrhythmias. It allows your health care provider to track your heart activity for several days, if needed. °· Stress tests by exercise or by giving medicine that makes the heart beat faster. °TREATMENT  °In most cases, no treatment is needed. Depending on the cause of your syncope, your health care provider may recommend changing or stopping some of your medicines. °HOME CARE INSTRUCTIONS °· Have someone stay with you until you feel stable. °· Do not drive, use machinery, or play sports until your health care provider says it is okay. °· Keep all follow-up appointments as directed by your health care provider. °· Lie down right away if you start feeling like you might faint. Breathe deeply and steadily. Wait until all the symptoms have passed. °· Drink enough fluids to keep your urine clear or pale yellow. °· If you are taking blood pressure or heart medicine, get up slowly and take several minutes to sit and then stand. This can reduce dizziness. °SEEK IMMEDIATE MEDICAL CARE IF:  °· You have a severe headache. °· You have unusual pain in the chest, abdomen, or back. °· You are bleeding from your mouth or rectum, or you have black or tarry stool. °· You have an irregular or very fast heartbeat. °· You have pain with breathing. °· You have repeated fainting or seizure-like jerking during an episode. °· You faint when sitting or lying down. °· You have confusion. °· You have trouble walking. °· You have severe weakness. °· You have vision problems. °If you fainted, call your local emergency services (911 in [place] Do not drive   yourself to the hospital.  °MAKE SURE YOU: °· Understand these instructions. °· Will watch your condition. °· Will get help right away if you are not doing well or get worse. °Document Released: 03/21/2005 Document Revised: 03/26/2013 Document Reviewed: 05/20/2011 °ExitCare® Patient Information ©2015 [employer]. This information is not intended to replace  advice given to you by your health care provider. Make sure you discuss any questions you have with your health care provider. ° °

## 2013-12-31 NOTE — ED Provider Notes (Signed)
This patient was seen in conjunction with the resident physician.  The documentation accurately reflects the patient's ED evaluation (with the following clarifications).  On my exam, the patient was awake and alert. Has a very notable history of arrhythmia, with close care conducted by a regional tertiary care facility.  ECG w sinus tach, 147, artefact, abnormal  However, once the patient was more comfortable, her HR was 80-90 on the monitor with regular rhythm - normal.  Patient's eval here was otherwise unremarkable and after we discussed her presentation with her cardiology team, she was d/c to f/u with them.    Gerhard Munchobert Syenna Nazir, MD 12/31/13 [phone]

## 2014-01-07 ENCOUNTER — Encounter (HOSPITAL_COMMUNITY): Payer: Self-pay | Admitting: Emergency Medicine

## 2014-01-07 ENCOUNTER — Emergency Department (HOSPITAL_COMMUNITY)
Admission: EM | Admit: 2014-01-07 | Discharge: 2014-01-07 | Disposition: A | Discharge status: Home / Self Care | Payer: BLUE CROSS/BLUE SHIELD | Attending: Emergency Medicine | Admitting: Emergency Medicine

## 2014-01-07 DIAGNOSIS — Z79899 Other long term (current) drug therapy: Secondary | ICD-10-CM | POA: Diagnosis not present

## 2014-01-07 DIAGNOSIS — Z3202 Encounter for pregnancy test, result negative: Secondary | ICD-10-CM | POA: Insufficient documentation

## 2014-01-07 DIAGNOSIS — R55 Syncope and collapse: Secondary | ICD-10-CM | POA: Diagnosis present

## 2014-01-07 DIAGNOSIS — I456 Pre-excitation syndrome: Secondary | ICD-10-CM | POA: Insufficient documentation

## 2014-01-07 DIAGNOSIS — Z7982 Long term (current) use of aspirin: Secondary | ICD-10-CM | POA: Diagnosis not present

## 2014-01-07 LAB — URINALYSIS, ROUTINE W REFLEX MICROSCOPIC
Bilirubin Urine: NEGATIVE
Glucose, UA: NEGATIVE mg/dL
HGB URINE DIPSTICK: NEGATIVE
Ketones, ur: NEGATIVE mg/dL
Leukocytes, UA: NEGATIVE
NITRITE: NEGATIVE
PROTEIN: NEGATIVE mg/dL
Specific Gravity, Urine: 1.002 — ABNORMAL LOW (ref 1.005–1.030)
UROBILINOGEN UA: 0.2 mg/dL (ref 0.0–1.0)
pH: 7 (ref 5.0–8.0)

## 2014-01-07 LAB — COMPREHENSIVE METABOLIC PANEL
ALT: 8 U/L (ref 0–35)
AST: 17 U/L (ref 0–37)
Albumin: 4.5 g/dL (ref 3.5–5.2)
Alkaline Phosphatase: 53 U/L (ref 39–117)
Anion gap: 14 (ref 5–15)
BUN: 9 mg/dL (ref 6–23)
CO2: 24 mEq/L (ref 19–32)
Calcium: 9.6 mg/dL (ref 8.4–10.5)
Chloride: 102 mEq/L (ref 96–112)
Creatinine, Ser: 0.79 mg/dL (ref 0.50–1.10)
GFR calc Af Amer: >90 mL/min (ref >90)
GFR calc non Af Amer: >90 mL/min (ref >90)
Glucose, Bld: 96 mg/dL (ref 70–99)
Potassium: 4.3 mEq/L (ref 3.7–5.3)
Sodium: 140 mEq/L (ref 137–147)
Total Bilirubin: 0.3 mg/dL (ref 0.3–1.2)
Total Protein: 8.1 g/dL (ref 6.0–8.3)

## 2014-01-07 LAB — CBC WITH DIFFERENTIAL/PLATELET
Basophils Absolute: 0 10*3/uL (ref 0.0–0.1)
Basophils Relative: 0 % (ref 0–1)
Eosinophils Absolute: 0 10*3/uL (ref 0.0–0.7)
Eosinophils Relative: 0 % (ref 0–5)
HCT: 37 % (ref 36.0–46.0)
Hemoglobin: 12.1 g/dL (ref 12.0–15.0)
Lymphocytes Relative: 26 % (ref 12–46)
Lymphs Abs: 1.8 10*3/uL (ref 0.7–4.0)
MCH: 28.1 pg (ref 26.0–34.0)
MCHC: 32.7 g/dL (ref 30.0–36.0)
MCV: 85.8 fL (ref 78.0–100.0)
Monocytes Absolute: 0.4 10*3/uL (ref 0.1–1.0)
Monocytes Relative: 5 % (ref 3–12)
Neutro Abs: 4.8 10*3/uL (ref 1.7–7.7)
Neutrophils Relative %: 69 % (ref 43–77)
Platelets: 236 10*3/uL (ref 150–400)
RBC: 4.31 MIL/uL (ref 3.87–5.11)
RDW: 11.9 % (ref 11.5–15.5)
WBC: 6.9 10*3/uL (ref 4.0–10.5)

## 2014-01-07 LAB — PREGNANCY, URINE: Preg Test, Ur: NEGATIVE

## 2014-01-07 LAB — POC URINE PREG, ED: PREG TEST UR: NEGATIVE

## 2014-01-07 MED ORDER — ONDANSETRON HCL 4 MG/2ML IJ SOLN
4.0000 mg | Freq: Once | INTRAMUSCULAR | Status: DC
  Filled 2014-01-07: qty 2

## 2014-01-07 MED ORDER — SODIUM CHLORIDE 0.9 % IV BOLUS (SEPSIS)
1000.0000 mL | Freq: Once | INTRAVENOUS | Status: AC
  Administered 2014-01-07: 1000 mL via INTRAVENOUS

## 2014-01-07 MED ORDER — ACETAMINOPHEN 500 MG PO TABS
1000.0000 mg | ORAL_TABLET | Freq: Once | ORAL | Status: AC
  Administered 2014-01-07: 1000 mg via ORAL
  Filled 2014-01-07: qty 2

## 2014-01-07 NOTE — Discharge Instructions (Signed)
Return here as needed. Follow up with your cardiologist tomorrow as directed

## 2014-01-07 NOTE — ED Notes (Signed)
PA made aware of the pt HA.

## 2014-01-07 NOTE — ED Notes (Signed)
Pt states had a holter monitor test 2 months ago-- [facility] Cardiologist-- Dr. Donnetta SimpersElijah Beaty.

## 2014-01-07 NOTE — ED Notes (Signed)
TO ED via [facility] from school -- with episode of near syncope-- was sitting in class-- felt heart racing, became light headed. Has HX of WPW with an ABLATION in 2011[facility]

## 2014-01-07 NOTE — ED Provider Notes (Signed)
Medical screening examination/treatment/procedure(s) were performed by non-physician practitioner and as supervising physician I was immediately available for consultation/collaboration.   EKG Interpretation None        Gilda Creasehristopher J. Pollina, MD 01/07/14 1446

## 2014-01-07 NOTE — ED Provider Notes (Signed)
CSN: [id number]     Arrival date & time 01/07/14  1133 History   First MD Initiated Contact with Patient 01/07/14 1142     Chief Complaint  Patient presents with  . Near Syncope  . Wolff-Parkinson-White Syndrome     (Consider location/radiation/quality/duration/timing/severity/associated sxs/prior Treatment) HPI Patient presents to the emergency department with a near syncopal episode that occurred just prior to arrival.  While she was sitting in class.  The patient said she felt like her heart was racing and became lightheaded.  Patient, states, that she felt like she was going to pass out, but never passed out completely.  She felt like the room was closing in on her.  The patient, states, that she did not have any shortness of breath, nausea, vomiting, weakness, dizziness, back pain, neck pain, fever, cough, incontinence, dysuria, rash, or syncope.  The patient, states, that nothing seems to make her condition, better or worse.  The patient, states, that she's had similar episodes in the past and was most recently seen in the ER.  Several weeks ago for similar symptoms.  Patient has an appointment tomorrow with her cardiologist Past Medical History  Diagnosis Date  . WPW (Wolff-Parkinson-White syndrome)     s/p ablation (2011)  . Syncope     recurrent  . Vasovagal syncope   . POTS (postural orthostatic tachycardia syndrome)   . Palpitations     daily  . Hx of echocardiogram 04/2012    normal  . History of Holter monitoring 06/2013    "NSR, sinus tach, sinus arrythmia, one PVC, one PAC"  . Tilt table evaluation 02/2011    dx neurocardiogenic syncope   Past Surgical History  Procedure Laterality Date  . Cardiac electrophysiology study and ablation  2011    [facility]   No family history on file. History  Substance Use Topics  . Smoking status: Never Smoker   . Smokeless tobacco: Not on file  . Alcohol Use: No   OB History   Grav Para Term Preterm Abortions TAB  SAB Ect Mult Living                 Review of Systems All other systems negative except as documented in the HPI. All pertinent positives and negatives as reviewed in the HPI.   Allergies  Amitiza; Ketorolac tromethamine; and Metoclopramide  Home Medications   Prior to Admission medications   Medication Sig Start Date End Date Taking? Authorizing Provider  aspirin EC 81 MG tablet Take 81 mg by mouth daily.   Yes Historical Provider, MD  sodium chloride 1 G tablet Take 1 g by mouth 2 (two) times daily.   Yes Historical Provider, MD   BP 111/70  Pulse 83  Temp(Src) 98.2 F (36.8 C) (Oral)  Resp 22  Ht 5\' 3"  (1.6 m)  Wt 134 lb (60.782 kg)  BMI 23.74 kg/m2  SpO2 100%  LMP 12/17/2013 Physical Exam  Nursing note and vitals reviewed. Constitutional: She is oriented to person, place, and time. She appears well-developed and well-nourished. No distress.  HENT:  Head: Normocephalic and atraumatic.  Mouth/Throat: Oropharynx is clear and moist.  Eyes: Pupils are equal, round, and reactive to light.  Neck: Normal range of motion. Neck supple.  Cardiovascular: Normal rate, regular rhythm and normal heart sounds.   Pulmonary/Chest: Effort normal and breath sounds normal. No respiratory distress. She has no wheezes.  Neurological: She is alert and oriented to person, place, and time. She exhibits normal  muscle tone. Coordination normal.  Skin: Skin is warm and dry. No rash noted. No erythema.    ED Course  Procedures (including critical care time) Labs Review Labs Reviewed  URINALYSIS, ROUTINE W REFLEX MICROSCOPIC - Abnormal; Notable for the following:    Specific Gravity, Urine 1.002 (*)    All other components within normal limits  CBC WITH DIFFERENTIAL  COMPREHENSIVE METABOLIC PANEL  PREGNANCY, URINE  POC URINE PREG, ED      EKG Interpretation None      Date: 01/07/2014  Rate: 98  Rhythm: normal sinus rhythm  QRS Axis: normal  Intervals: normal  ST/T Wave  abnormalities: normal  Conduction Disutrbances:none  Narrative Interpretation:   Old EKG Reviewed: unchanged    Patient will be referred to her appointment tomorrow with her cardiologist to return here as needed.  Patient is given IV fluids and does have some improvement in her symptoms.  Patient, agrees to the plan and all questions were  Carlyle Dolly, PA-C 01/07/14 1446

## 2014-01-09 ENCOUNTER — Encounter (HOSPITAL_COMMUNITY): Payer: Self-pay | Admitting: Emergency Medicine

## 2014-01-09 ENCOUNTER — Emergency Department (HOSPITAL_COMMUNITY)
Admission: EM | Admit: 2014-01-09 | Discharge: 2014-01-09 | Disposition: A | Discharge status: Home / Self Care | Payer: BLUE CROSS/BLUE SHIELD | Attending: Emergency Medicine | Admitting: Emergency Medicine

## 2014-01-09 DIAGNOSIS — Z79899 Other long term (current) drug therapy: Secondary | ICD-10-CM | POA: Insufficient documentation

## 2014-01-09 DIAGNOSIS — Z7982 Long term (current) use of aspirin: Secondary | ICD-10-CM | POA: Diagnosis not present

## 2014-01-09 DIAGNOSIS — R55 Syncope and collapse: Secondary | ICD-10-CM | POA: Diagnosis present

## 2014-01-09 DIAGNOSIS — Z3202 Encounter for pregnancy test, result negative: Secondary | ICD-10-CM | POA: Diagnosis not present

## 2014-01-09 DIAGNOSIS — Z9889 Other specified postprocedural states: Secondary | ICD-10-CM | POA: Insufficient documentation

## 2014-01-09 DIAGNOSIS — R002 Palpitations: Secondary | ICD-10-CM | POA: Diagnosis not present

## 2014-01-09 DIAGNOSIS — I456 Pre-excitation syndrome: Secondary | ICD-10-CM | POA: Insufficient documentation

## 2014-01-09 LAB — CBG MONITORING, ED
GLUCOSE-CAPILLARY: 60 mg/dL — AB (ref 70–99)
Glucose-Capillary: 77 mg/dL (ref 70–99)
Glucose-Capillary: 69 mg/dL — ABNORMAL LOW (ref 70–99)

## 2014-01-09 LAB — POC URINE PREG, ED: PREG TEST UR: NEGATIVE

## 2014-01-09 MED ORDER — LORAZEPAM 2 MG/ML IJ SOLN: 1.0000 mg | INTRAMUSCULAR | Status: DC | PRN

## 2014-01-09 MED ORDER — ONDANSETRON 4 MG PO TBDP
4.0000 mg | ORAL_TABLET | Freq: Once | ORAL | Status: AC
  Administered 2014-01-09: 4 mg via ORAL
  Filled 2014-01-09: qty 1

## 2014-01-09 NOTE — Discharge Instructions (Signed)
Per your Cardiologist: Take 1000mg  of salt tablets morning, and 1000mg  evening. Drink at least 3 liters of water per day. Wear TED/Compression hose daily.  Syncope Syncope means a person passes out (faints). The person usually wakes up in less than 5 minutes. It is important to seek medical care for syncope. HOME CARE  Have someone stay with you until you feel normal.  Do not drive, use machines, or play sports until your doctor says it is okay.  Keep all doctor visits as told.  Lie down when you feel like you might pass out. Take deep breaths. Wait until you feel normal before standing up.  Drink enough fluids to keep your pee (urine) clear or pale yellow.  If you take blood pressure or heart medicine, get up slowly. Take several minutes to sit and then stand. GET HELP RIGHT AWAY IF:   You have a severe headache.  You have pain in the chest, belly (abdomen), or back.  You are bleeding from the mouth or butt (rectum).  You have black or tarry poop (stool).  You have an irregular or very fast heartbeat.  You have pain with breathing.  You keep passing out, or you have shaking (seizures) when you pass out.  You pass out when sitting or lying down.  You feel confused.  You have trouble walking.  You have severe weakness.  You have vision problems. If you fainted, call your local emergency services (911 in [place] Do not drive yourself to the hospital. MAKE SURE YOU:   Understand these instructions.  Will watch your condition.  Will get help right away if you are not doing well or get worse. Document Released: 09/07/2007 Document Revised: 09/20/2011 Document Reviewed: 05/20/2011 [facility] Patient Information 2015 [place]. This information is not intended to replace advice given to you by your health care provider. Make sure you discuss any questions you have with your health care provider.

## 2014-01-09 NOTE — ED Notes (Signed)
EMS- pt from home, found face down on hardwood floor by family member. A and O X4 on EMS arrival. Pt had initial CBG of 59, given orange juice and came up to 90. Pt c/o headache at this time and some dizziness. She has history of [employer] Data SystemsWolfe Parkinson White syndrome, dx in 2011. Pt a&o X4 on arrival.

## 2014-01-09 NOTE — ED Notes (Signed)
EDP aware of pt's CBG, unable to obtain IV access at this time. EDP aware. Pt given apple juice.

## 2014-01-09 NOTE — ED Provider Notes (Signed)
CSN: [id number]     Arrival date & time 01/09/14  1050 History   First MD Initiated Contact with Patient 01/09/14 1101     Chief Complaint  Patient presents with  . Loss of Consciousness      HPI  Patient presents with an episode of palpitations and syncope. He states she was walking down the hallway at home this morning she fell heart going fast, and then fell to the floor. After a few seconds,  woke up.  Legs mild nausea now. Has had multiple similar past episodes. In 2011 underwent ablation for Wolff-Parkinson-White. Has had recurrent symptoms. Had an outpatient Isuprel augmented tilt table. She does not know the results of this. Performed by Dr.Beaty, her cardiologist at [facility].   Past Medical History  Diagnosis Date  . WPW (Wolff-Parkinson-White syndrome)     s/p ablation (2011)  . Syncope     recurrent  . Vasovagal syncope   . POTS (postural orthostatic tachycardia syndrome)   . Palpitations     daily  . Hx of echocardiogram 04/2012    normal  . History of Holter monitoring 06/2013    "NSR, sinus tach, sinus arrythmia, one PVC, one PAC"  . Tilt table evaluation 02/2011    dx neurocardiogenic syncope   Past Surgical History  Procedure Laterality Date  . Cardiac electrophysiology study and ablation  2011    [facility]ev[facility] at [facility]   History reviewed. No pertinent family history. History  Substance Use Topics  . Smoking status: Never Smoker   . Smokeless tobacco: Not on file  . Alcohol Use: No   OB History   Grav Para Term Preterm Abortions TAB SAB Ect Mult Living                 Review of Systems  Constitutional: Negative for fever, chills, diaphoresis, appetite change and fatigue.  HENT: Negative for mouth sores, sore throat and trouble swallowing.   Eyes: Negative for visual disturbance.  Respiratory: Negative for cough, chest tightness, shortness of breath and wheezing.   Cardiovascular: Positive for palpitations. Negative for chest pain.  Gastrointestinal:  Negative for nausea, vomiting, abdominal pain, diarrhea and abdominal distention.  Endocrine: Negative for polydipsia, polyphagia and polyuria.  Genitourinary: Negative for dysuria, frequency and hematuria.  Musculoskeletal: Negative for gait problem.  Skin: Negative for color change, pallor and rash.  Neurological: Positive for syncope. Negative for dizziness, light-headedness and headaches.  Hematological: Does not bruise/bleed easily.  Psychiatric/Behavioral: Negative for behavioral problems and confusion.      Allergies  Amitiza; Ketorolac tromethamine; and Metoclopramide  Home Medications   Prior to Admission medications   Medication Sig Start Date End Date Taking? Authorizing Provider  aspirin EC 81 MG tablet Take 81 mg by mouth daily.   Yes Historical Provider, MD  sodium chloride 1 G tablet Take 1 g by mouth 2 (two) times daily.   Yes Historical Provider, MD   BP 116/63  Pulse 83  Temp(Src) 98.5 F (36.9 C) (Oral)  Resp 24  Ht 5\' 3"  (1.6 m)  Wt 134 lb (60.782 kg)  BMI 23.74 kg/m2  SpO2 100%  LMP 12/17/2013 Physical Exam  Constitutional: She is oriented to person, place, and time. She appears well-developed and well-nourished. No distress.  HENT:  Head: Normocephalic.  Eyes: Conjunctivae are normal. Pupils are equal, round, and reactive to light. No scleral icterus.  Conjunctiva are not pale.  Neck: Normal range of motion. Neck supple. No thyromegaly present.  Cardiovascular: Normal rate  and regular rhythm.  Exam reveals no gallop and no friction rub.   No murmur heard. Regular rhythm. Sinus rhythm monitor. No ectopy. Orthostatic negative.  Pulmonary/Chest: Effort normal and breath sounds normal. No respiratory distress. She has no wheezes. She has no rales.  Abdominal: Soft. Bowel sounds are normal. She exhibits no distension. There is no tenderness. There is no rebound.  Musculoskeletal: Normal range of motion.  Neurological: She is alert and oriented to person,  place, and time.  Skin: Skin is warm and dry. No rash noted.  Psychiatric: She has a normal mood and affect. Her behavior is normal.    ED Course  Procedures (including critical care time) Labs Review Labs Reviewed  CBG MONITORING, ED - Abnormal; Notable for the following:    Glucose-Capillary 60 (*)    All other components within normal limits  CBG MONITORING, ED - Abnormal; Notable for the following:    Glucose-Capillary 69 (*)    All other components within normal limits  POC URINE PREG, ED  CBG MONITORING, ED  CBG MONITORING, ED    Imaging Review No results found.   EKG Interpretation   Date/Time:  Thursday January 09 2014 13:32:52 EDT Ventricular Rate:  89 PR Interval:  118 QRS Duration: 74 QT Interval:  350 QTC Calculation: 426 R Axis:   71 Text Interpretation:  Sinus rhythm Borderline short PR interval Borderline  T wave abnormalities Confirmed by Fayrene FearingJAMES  MD, Larah Kuntzman ([id number]) on 01/09/2014  2:09:40 PM      MDM   Final diagnoses:  Syncope, unspecified syncope type  WOLFF-PARKINSON-WHITE (WPW) SYNDROME status post ablation    Patient is a sinus rhythm here. EKG shows short PR, but no preexcitation. Given by mouth liquids. Given Zofran. Is symptomatic. Declines IV and lab draw. I discussed the case with [facility]. He reviewed her tilt table from yesterday. No arrhythmias or reproduction of symptoms noted. Recommendation is two grams salt per day, 3 L fluid per day, compression hose, followup with her cardiologist.    Rolland PorterMark Margurete Guaman, MD 01/09/14 1421

## 2014-01-09 NOTE — ED Notes (Signed)
CBG- 60 

## 2014-01-21 ENCOUNTER — Emergency Department (HOSPITAL_COMMUNITY): Payer: BC Managed Care – PPO

## 2014-01-21 ENCOUNTER — Emergency Department (HOSPITAL_COMMUNITY)
Admission: EM | Admit: 2014-01-21 | Discharge: 2014-01-21 | Disposition: A | Discharge status: Home / Self Care | Payer: BC Managed Care – PPO | Attending: Emergency Medicine | Admitting: Emergency Medicine

## 2014-01-21 ENCOUNTER — Encounter (HOSPITAL_COMMUNITY): Payer: Self-pay | Admitting: Emergency Medicine

## 2014-01-21 DIAGNOSIS — G90A Postural orthostatic tachycardia syndrome (POTS): Secondary | ICD-10-CM

## 2014-01-21 DIAGNOSIS — R4020 Unspecified coma: Secondary | ICD-10-CM | POA: Diagnosis present

## 2014-01-21 DIAGNOSIS — Z7982 Long term (current) use of aspirin: Secondary | ICD-10-CM | POA: Insufficient documentation

## 2014-01-21 DIAGNOSIS — I498 Other specified cardiac arrhythmias: Secondary | ICD-10-CM | POA: Insufficient documentation

## 2014-01-21 DIAGNOSIS — Z79899 Other long term (current) drug therapy: Secondary | ICD-10-CM | POA: Insufficient documentation

## 2014-01-21 DIAGNOSIS — R000 Tachycardia, unspecified: Secondary | ICD-10-CM

## 2014-01-21 DIAGNOSIS — I951 Orthostatic hypotension: Secondary | ICD-10-CM

## 2014-01-21 DIAGNOSIS — R42 Dizziness and giddiness: Secondary | ICD-10-CM | POA: Diagnosis not present

## 2014-01-21 DIAGNOSIS — R079 Chest pain, unspecified: Secondary | ICD-10-CM | POA: Insufficient documentation

## 2014-01-21 DIAGNOSIS — R55 Syncope and collapse: Secondary | ICD-10-CM | POA: Diagnosis not present

## 2014-01-21 DIAGNOSIS — R011 Cardiac murmur, unspecified: Secondary | ICD-10-CM | POA: Insufficient documentation

## 2014-01-21 DIAGNOSIS — R51 Headache: Secondary | ICD-10-CM | POA: Diagnosis not present

## 2014-01-21 DIAGNOSIS — M542 Cervicalgia: Secondary | ICD-10-CM | POA: Diagnosis not present

## 2014-01-21 LAB — BASIC METABOLIC PANEL
ANION GAP: 13 (ref 5–15)
BUN: 8 mg/dL (ref 6–23)
CALCIUM: 9.2 mg/dL (ref 8.4–10.5)
CHLORIDE: 106 meq/L (ref 96–112)
CO2: 23 mEq/L (ref 19–32)
CREATININE: 0.74 mg/dL (ref 0.50–1.10)
GFR calc Af Amer: >90 mL/min (ref >90)
GFR calc non Af Amer: >90 mL/min (ref >90)
Glucose, Bld: 86 mg/dL (ref 70–99)
Potassium: 4.2 mEq/L (ref 3.7–5.3)
Sodium: 142 mEq/L (ref 137–147)

## 2014-01-21 LAB — CBC WITH DIFFERENTIAL/PLATELET
BASOS ABS: 0 10*3/uL (ref 0.0–0.1)
Basophils Relative: 0 % (ref 0–1)
EOS PCT: 0 % (ref 0–5)
Eosinophils Absolute: 0 10*3/uL (ref 0.0–0.7)
HEMATOCRIT: 32.4 % — AB (ref 36.0–46.0)
HEMOGLOBIN: 10.5 g/dL — AB (ref 12.0–15.0)
Lymphocytes Relative: 25 % (ref 12–46)
Lymphs Abs: 1.7 10*3/uL (ref 0.7–4.0)
MCH: 28.5 pg (ref 26.0–34.0)
MCHC: 32.4 g/dL (ref 30.0–36.0)
MCV: 87.8 fL (ref 78.0–100.0)
MONO ABS: 0.4 10*3/uL (ref 0.1–1.0)
MONOS PCT: 5 % (ref 3–12)
NEUTROS ABS: 4.8 10*3/uL (ref 1.7–7.7)
Neutrophils Relative %: 70 % (ref 43–77)
Platelets: 257 10*3/uL (ref 150–400)
RBC: 3.69 MIL/uL — ABNORMAL LOW (ref 3.87–5.11)
RDW: 12.2 % (ref 11.5–15.5)
WBC: 6.8 10*3/uL (ref 4.0–10.5)

## 2014-01-21 LAB — RAPID URINE DRUG SCREEN, HOSP PERFORMED
AMPHETAMINES: NOT DETECTED
BARBITURATES: NOT DETECTED
BENZODIAZEPINES: NOT DETECTED
Cocaine: NOT DETECTED
Opiates: NOT DETECTED
Tetrahydrocannabinol: NOT DETECTED

## 2014-01-21 MED ORDER — ONDANSETRON HCL 4 MG/2ML IJ SOLN
4.0000 mg | Freq: Once | INTRAMUSCULAR | Status: AC
  Administered 2014-01-21: 4 mg via INTRAVENOUS
  Filled 2014-01-21: qty 2

## 2014-01-21 MED ORDER — FENTANYL CITRATE 0.05 MG/ML IJ SOLN
50.0000 ug | Freq: Once | INTRAMUSCULAR | Status: AC
  Administered 2014-01-21: 50 ug via INTRAVENOUS
  Filled 2014-01-21: qty 2

## 2014-01-21 MED ORDER — SODIUM CHLORIDE 0.9 % IV BOLUS (SEPSIS)
1000.0000 mL | Freq: Once | INTRAVENOUS | Status: AC
  Administered 2014-01-21: 1000 mL via INTRAVENOUS

## 2014-01-21 NOTE — ED Provider Notes (Signed)
CSN: [id number]     Arrival date & time 01/21/14  1122 History   First MD Initiated Contact with Patient 01/21/14 1124     Chief Complaint  Patient presents with  . Loss of Consciousness    hx of wpw, nsr now     (Consider location/radiation/quality/duration/timing/severity/associated sxs/prior Treatment) HPI Comments: Pt comes in with cc of syncope. Pt has hx of POTS, WPW s/p ablation, and multiple syncope. Pt was in her class, she got up to leave, and passed out. She had a slight prodrome and currently has a headache and some palpitations and tightness to her chest . She also has neck pain, no numbness, tingling. Pt has had multiple syncope. [facility], and per records had a Holter and TILT table exam - both non conclusive. Pt denies any stimulant use, substance abuse. No hx of PE, DVT, no risk factors for the same.  Patient is a 23 y.o. female presenting with syncope. The history is provided by the patient.  Loss of Consciousness Associated symptoms: chest pain, dizziness and headaches   Associated symptoms: no nausea, no shortness of breath and no vomiting     Past Medical History  Diagnosis Date  . WPW (Wolff-Parkinson-White syndrome)     s/p ablation (2011)  . Syncope     recurrent  . Vasovagal syncope   . POTS (postural orthostatic tachycardia syndrome)   . Palpitations     daily  . Hx of echocardiogram 04/2012    normal  . History of Holter monitoring 06/2013    "NSR, sinus tach, sinus arrythmia, one PVC, one PAC"  . Tilt table evaluation 02/2011    dx neurocardiogenic syncope   Past Surgical History  Procedure Laterality Date  . Cardiac electrophysiology study and ablation  2011    [facility]   No family history on file. History  Substance Use Topics  . Smoking status: Never Smoker   . Smokeless tobacco: Not on file  . Alcohol Use: No   OB History   Grav Para Term Preterm Abortions TAB SAB Ect Mult Living                  Review of Systems  Constitutional: Negative for activity change.  Respiratory: Negative for shortness of breath.   Cardiovascular: Positive for chest pain and syncope.  Gastrointestinal: Negative for nausea, vomiting and abdominal pain.  Genitourinary: Negative for dysuria.  Musculoskeletal: Positive for neck pain. Negative for myalgias.  Skin: Negative for rash.  Neurological: Positive for dizziness, syncope, light-headedness and headaches.  All other systems reviewed and are negative.     Allergies  Amitiza; Ketorolac tromethamine; and Metoclopramide  Home Medications   Prior to Admission medications   Medication Sig Start Date End Date Taking? Authorizing Provider  aspirin EC 81 MG tablet Take 81 mg by mouth daily.    Historical Provider, MD  sodium chloride 1 G tablet Take 1 g by mouth 2 (two) times daily.    Historical Provider, MD   BP 118/71  Pulse 79  Temp(Src) 98.7 F (37.1 C) (Oral)  Resp 15  Ht 5\' 3"  (1.6 m)  Wt 132 lb (59.875 kg)  BMI 23.39 kg/m2  SpO2 100%  LMP 01/17/2014 Physical Exam  Nursing note and vitals reviewed. Constitutional: She is oriented to person, place, and time. She appears well-developed and well-nourished.  HENT:  Head: Normocephalic and atraumatic.  Eyes: EOM are normal. Pupils are equal, round, and reactive to  light.  Neck: Neck supple. No JVD present.  Cardiovascular: Regular rhythm.   Murmur heard. Pulmonary/Chest: Effort normal. No respiratory distress.  Abdominal: Soft. She exhibits no distension. There is no tenderness. There is no rebound and no guarding.  Neurological: She is alert and oriented to person, place, and time.  Skin: Skin is warm and dry.    ED Course  Procedures (including critical care time) Labs Review Labs Reviewed  CBC WITH DIFFERENTIAL - Abnormal; Notable for the following:    RBC 3.69 (*)    Hemoglobin 10.5 (*)    HCT 32.4 (*)    All other components within normal limits  BASIC METABOLIC PANEL   URINE RAPID DRUG SCREEN (HOSP PERFORMED)    Imaging Review Dg Cervical Spine Complete  01/21/2014   CLINICAL DATA:  Acute neck pain after fall. Positive loss of consciousness.  EXAM: CERVICAL SPINE  4+ VIEWS  COMPARISON:  None.  FINDINGS: There is no evidence of cervical spine fracture or prevertebral soft tissue swelling. Alignment is normal. No other significant bone abnormalities are identified.  IMPRESSION: Negative cervical spine radiographs.   Electronically Signed   By: Roque LiasJames  Green M.D.   On: 01/21/2014 13:22     EKG Interpretation   Date/Time:  Tuesday January 21 2014 11:35:35 EDT Ventricular Rate:  87 PR Interval:  123 QRS Duration: 79 QT Interval:  355 QTC Calculation: 427 R Axis:   87 Text Interpretation:  Sinus rhythm normal intervals No acute findings  Confirmed by Rhunette CroftNANAVATI, MD, Janey GentaANKIT [phone]([id number]) on 01/21/2014 1:39:05 PM      MDM   Final diagnoses:  Syncope, unspecified syncope type  POTS (postural orthostatic tachycardia syndrome)    DDx includes: Orthostatic hypotension Stroke Vertebral artery dissection/stenosis Dysrhythmia PE Vasovagal/neurocardiogenic syncope Aortic stenosis Valvular disorder/Cardiomyopathy Anemia  Pt comes in with cc of syncope. Hx of POTS, WPW. [facility]. Pt has frequent syncope. S/p ablation in the past.  [facility] - neg holter and neg tilt test.  Spoke with Dr. Alfonso EllisBeaty over the phone. He recommends monitoring patient in the ER for a while, and if her monitoring stays non acute - then she will be seen by his service tomorrow. Pt was to get a call from the scheduler. Pt also spoke with Dr. Alfonso EllisBeaty. She is ok with the plan.  ED monitoring is normal, except for tachycardia, which we had always been aware of. No concerns for thyroid storm (weight loss, fevers, ams).     Derwood KaplanAnkit Tamari Redwine, MD 01/21/14 1524

## 2014-01-21 NOTE — ED Notes (Signed)
Cervical xrays negative, EDP informed, requested c-collar removal.  Request granted, c-collar removed.

## 2014-01-21 NOTE — ED Notes (Addendum)
Per EMS: was walking, patient felt her heart racing, before she could tell friend that she needed to sit down, patient fell face-forward onto ground.  Hx of WPW.  Hx of cardiac ablation in 2011, Dr. Donnetta SimpersElijah [facility]y [facility].  CBG 131.  Patient was a little confused upon waking, increasing alert. C/o neck pain, and head pain/photophobia, slight chest pressure and nausea.  Refused asa nitro.  Refused zofran.  Cervical collar in place.  107/75, 90 HR, RR 16, 100% ra, 20 gauge left AC

## 2014-01-21 NOTE — Discharge Instructions (Signed)
We saw you in the ER for the passing out. All the results in the ER are normal, labs and imaging. Dr. Alfonso EllisBeaty wants to see you in the clinic tomorrow, please follow up with their recommendations.  Please return to the ER if your symptoms worsen.   Driving and Equipment Restrictions Some medical problems make it dangerous to drive, ride a bike, or use machines. Some of these problems are:  A hard blow to the head (concussion).  Passing out (fainting).  Twitching and shaking (seizures).  Low blood sugar.  Taking medicine to help you relax (sedatives).  Taking pain medicines.  Wearing an eye patch.  Wearing splints. This can make it hard to use parts of your body that you need to drive safely. HOME CARE   Do not drive until your doctor says it is okay.  Do not use machines until your doctor says it is okay. You may need a form signed by your doctor (medical release) before you can drive again. You may also need this form before you do other tasks where you need to be fully alert. MAKE SURE YOU:  Understand these instructions.  Will watch your condition.  Will get help right away if you are not doing well or get worse. Document Released: 04/28/2004 Document Revised: 06/13/2011 Document Reviewed: 07/29/2009 [facility] Patient Information 2015 [place]. This information is not intended to replace advice given to you by your health care provider. Make sure you discuss any questions you have with your health care provider.  Postural Orthostatic Tachycardia Syndrome Postural orthostatic tachycardia syndrome (POTS) is an increased heart rate when going from a lying (supine) position to a standing position. The heart rate may increase more than 30 beats per minute (BPM) above its resting rate when going from a lying to a standing position. POTS occurs more frequently in women than in men.  SYMPTOMS  POTS symptoms may be increased in the morning. Symptoms of POTS  include:  Fainting or near fainting.  Inability to think clearly.  Extreme or chronic fatigue.  Exercise intolerance.  Chest pain.  Having the lower legs develop a reddish-blue color due to decreased blood flow (acrocyanosis). CAUSES POTS can be caused by different conditions. Sometimes, it has no known cause (idiopathic). Some causes of POTS include:  Viral illness.  Pregnancy.  Autoimmune diseases.  Medications.  Major surgery.  Trauma such as a car accident or major injury.  Medical conditions such as anemia, dehydration, and hyperthyroidism. DIAGNOSIS  POTS is diagnosed by:  Taking a complete history and physical exam.  Measuring the heart rate while lying and then upon standing.  Measuring blood pressure when going from a lying to a standing position. POTS is usually not associated with low blood pressure (orthostatic hypotension) when going from a lying to standing position. While standing, blood pressure should be taken 2, 5, and 10 minutes after getting up. TREATMENT  Treatment of POTS depends upon the severity of the symptoms. Treatment includes:  Drinking plenty of fluids to avoid getting dehydrated.  Avoiding very hot environments to not get overheated.  Increasing your dietary salt intake as instructed by your caregiver.  Taking different types of medications as prescribed for POTS.  Avoiding some classes of medications such as vasodilators and diuretics. SEEK IMMEDIATE MEDICAL CARE IF  You have severe chest pain that does not go away. Call your local emergency service immediately.  You feel your heart racing or beating rapidly.  You feel like passing out.  You  have very confused thinking. MAKE SURE YOU  Understand these instructions.  Will watch your condition.  Will get help right away if you are not doing well or get worse. Document Released: 03/11/2002 Document Revised: 08/05/2013 Document Reviewed: 05/19/2010 [facility] Patient  Information 2015 [place], [place]. This information is not intended to replace advice given to you by your health care provider. Make sure you discuss any questions you have with your health care provider.  Syncope Syncope is a medical term for fainting or passing out. This means you lose consciousness and drop to the ground. People are generally unconscious for less than 5 minutes. You may have some muscle twitches for up to 15 seconds before waking up and returning to normal. Syncope occurs more often in older adults, but it can happen to anyone. While most causes of syncope are not dangerous, syncope can be a sign of a serious medical problem. It is important to seek medical care.  CAUSES  Syncope is caused by a sudden drop in blood flow to the brain. The specific cause is often not determined. Factors that can bring on syncope include:  Taking medicines that lower blood pressure.  Sudden changes in posture, such as standing up quickly.  Taking more medicine than prescribed.  Standing in one place for too long.  Seizure disorders.  Dehydration and excessive exposure to heat.  Low blood sugar (hypoglycemia).  Straining to have a bowel movement.  Heart disease, irregular heartbeat, or other circulatory problems.  Fear, emotional distress, seeing blood, or severe pain. SYMPTOMS  Right before fainting, you may:  Feel dizzy or light-headed.  Feel nauseous.  See all white or all black in your field of vision.  Have cold, clammy skin. DIAGNOSIS  Your health care provider will ask about your symptoms, perform a physical exam, and perform an electrocardiogram (ECG) to record the electrical activity of your heart. Your health care provider may also perform other heart or blood tests to determine the cause of your syncope which may include:  Transthoracic echocardiogram (TTE). During echocardiography, sound waves are used to evaluate how blood flows through your heart.  Transesophageal  echocardiogram (TEE).  Cardiac monitoring. This allows your health care provider to monitor your heart rate and rhythm in real time.  Holter monitor. This is a portable device that records your heartbeat and can help diagnose heart arrhythmias. It allows your health care provider to track your heart activity for several days, if needed.  Stress tests by exercise or by giving medicine that makes the heart beat faster. TREATMENT  In most cases, no treatment is needed. Depending on the cause of your syncope, your health care provider may recommend changing or stopping some of your medicines. HOME CARE INSTRUCTIONS  Have someone stay with you until you feel stable.  Do not drive, use machinery, or play sports until your health care provider says it is okay.  Keep all follow-up appointments as directed by your health care provider.  Lie down right away if you start feeling like you might faint. Breathe deeply and steadily. Wait until all the symptoms have passed.  Drink enough fluids to keep your urine clear or pale yellow.  If you are taking blood pressure or heart medicine, get up slowly and take several minutes to sit and then stand. This can reduce dizziness. SEEK IMMEDIATE MEDICAL CARE IF:   You have a severe headache.  You have unusual pain in the chest, abdomen, or back.  You are bleeding from your  mouth or rectum, or you have black or tarry stool.  You have an irregular or very fast heartbeat.  You have pain with breathing.  You have repeated fainting or seizure-like jerking during an episode.  You faint when sitting or lying down.  You have confusion.  You have trouble walking.  You have severe weakness.  You have vision problems. If you fainted, call your local emergency services (911 in [place] Do not drive yourself to the hospital.  MAKE SURE YOU:  Understand these instructions.  Will watch your condition.  Will get help right away if you are not doing well  or get worse. Document Released: 03/21/2005 Document Revised: 03/26/2013 Document Reviewed: 05/20/2011 [facility] Patient Information 2015 [place], [place]. This information is not intended to replace advice given to you by your health care provider. Make sure you discuss any questions you have with your health care provider.  Tilt Table Test A tilt table test is a test to evaluate the cause of unexplained fainting (syncope) or dizziness. You will be safely secured to a table that moves you from a lying position to an upright position. During the test, your heart rate, heart rhythm, and blood pressure will be monitored. Your caregiver may order a tilt table test for the following reasons:  Recurrent, unexplained loss of consciousness.  Recurrent near loss of consciousness.  Recurrent, unexplained dizziness.  Recurrent falls, especially in elderly people.  Sudden drop in blood pressure when standing up (orthostatic hypotension). Causes of unexplained fainting or near fainting can involve an imbalance between the nervous and vascular systems. Fainting can occur from a drop in blood pressure or from a drop in heart rate that causes a drop in blood pressure.  RISKS AND COMPLICATIONS During the test, you may:  Feel lightheaded or dizzy.  Faint.  Feel nauseous or vomit.  Have blurry vision.  Feel cold or clammy. BEFORE THE PROCEDURE  Do not eat before the test. Your caregiver will tell you how many hours before the test you need to stop eating.  Ask your caregiver about changing or stopping your regular medicines. This is especially important if you are taking diabetes or blood pressure medicine.  Make sure you let your primary caregiver know that you are having a tilt table test. PROCEDURE  An intravenous line (IV) will be inserted into a vein in your hand or arm.  Your heart rate, heart rhythm, blood pressure, and oxygen saturation will be continuously monitored throughout the  test.  You will lie down on a table. The table will have a footboard and safety straps to keep you in place.  While lying flat, the neck arteries (carotid arteries) on either side of your neck will be checked. This involves your caregiver listening with a stethoscope to the carotid arteries to check for abnormal sounds (bruits). Your caregiver will then press on the neck arteries one at a time for several seconds. These steps will be repeated when you are in an upright position.  After you have been lying down for a period of time, you will be placed in an upright position. If you have any feeling of dizziness or feel like you are going to faint, let your caregiver know right away.  If you have dizziness, a drop in blood pressure, or a drop in heart rate, the table will be immediately placed back in a flat or head down position. If your heart rate or blood pressure does not return to normal after being placed in  a flat position, medicine can be given to help with symptoms of low blood pressure or heart rate.  If you do not have symptoms when placed in an upright position, medicine may be given to provoke symptoms of dizziness while you are in an upright position for the test. Medicine may be given under the tongue or in the IV. AFTER THE PROCEDURE  When your vital signs are stable and you do not have symptoms of dizziness or feel like you may faint, you may be allowed to go home.  Arrange for someone to drive you home.  Ask when your test results will be ready. Make sure you get your test results. Document Released: 03/02/2004 Document Revised: 09/20/2011 Document Reviewed: 06/23/2011 [facility] Patient Information 2015 [place], [place]. This information is not intended to replace advice given to you by your health care provider. Make sure you discuss any questions you have with your health care provider.

## 2014-01-22 HISTORY — PX: LOOP RECORDER IMPLANT: SHX5954

## 2014-02-20 ENCOUNTER — Emergency Department (HOSPITAL_COMMUNITY)
Admission: EM | Admit: 2014-02-20 | Discharge: 2014-02-20 | Disposition: A | Discharge status: Home / Self Care | Payer: BC Managed Care – PPO | Attending: Emergency Medicine | Admitting: Emergency Medicine

## 2014-02-20 ENCOUNTER — Encounter (HOSPITAL_COMMUNITY): Payer: Self-pay | Admitting: *Deleted

## 2014-02-20 DIAGNOSIS — I951 Orthostatic hypotension: Secondary | ICD-10-CM | POA: Insufficient documentation

## 2014-02-20 DIAGNOSIS — R42 Dizziness and giddiness: Secondary | ICD-10-CM | POA: Diagnosis not present

## 2014-02-20 DIAGNOSIS — Z3202 Encounter for pregnancy test, result negative: Secondary | ICD-10-CM | POA: Insufficient documentation

## 2014-02-20 DIAGNOSIS — R000 Tachycardia, unspecified: Secondary | ICD-10-CM | POA: Insufficient documentation

## 2014-02-20 DIAGNOSIS — R002 Palpitations: Secondary | ICD-10-CM | POA: Diagnosis present

## 2014-02-20 DIAGNOSIS — Z7982 Long term (current) use of aspirin: Secondary | ICD-10-CM | POA: Insufficient documentation

## 2014-02-20 LAB — CBC
HCT: 33.1 % — ABNORMAL LOW (ref 36.0–46.0)
Hemoglobin: 10.5 g/dL — ABNORMAL LOW (ref 12.0–15.0)
MCH: 28.1 pg (ref 26.0–34.0)
MCHC: 31.7 g/dL (ref 30.0–36.0)
MCV: 88.5 fL (ref 78.0–100.0)
PLATELETS: 235 10*3/uL (ref 150–400)
RBC: 3.74 MIL/uL — AB (ref 3.87–5.11)
RDW: 12.1 % (ref 11.5–15.5)
WBC: 4.7 10*3/uL (ref 4.0–10.5)

## 2014-02-20 LAB — I-STAT TROPONIN, ED: Troponin i, poc: 0 ng/mL (ref 0.00–0.08)

## 2014-02-20 LAB — BASIC METABOLIC PANEL
Anion gap: 12 (ref 5–15)
BUN: 9 mg/dL (ref 6–23)
CHLORIDE: 105 meq/L (ref 96–112)
CO2: 25 meq/L (ref 19–32)
Calcium: 9.7 mg/dL (ref 8.4–10.5)
Creatinine, Ser: 0.75 mg/dL (ref 0.50–1.10)
GFR calc non Af Amer: >90 mL/min (ref >90)
Glucose, Bld: 82 mg/dL (ref 70–99)
POTASSIUM: 4.5 meq/L (ref 3.7–5.3)
Sodium: 142 mEq/L (ref 137–147)

## 2014-02-20 LAB — POC URINE PREG, ED: Preg Test, Ur: NEGATIVE

## 2014-02-20 NOTE — Discharge Instructions (Signed)
Return to the ER for severe or worsening symptoms.  Please call your doctor for a followup appointment within 24-48 hours. When you talk to your doctor please let them know that you were seen in the emergency department and have them acquire all of your records so that they can discuss the findings with you and formulate a treatment plan to fully care for your new and ongoing problems.

## 2014-02-20 NOTE — ED Notes (Signed)
EKG given to Dr. Miller.  

## 2014-02-20 NOTE — ED Notes (Signed)
[employer] loop recorder interrogated and transmitted.  Informed NS to be waiting on call

## 2014-02-20 NOTE — ED Provider Notes (Signed)
CSN: [id number]     Arrival date & time 02/20/14  1204 History   First MD Initiated Contact with Patient 02/20/14 1210     Chief Complaint  Patient presents with  . Palpitations  . Dizziness     (Consider location/radiation/quality/duration/timing/severity/associated sxs/prior Treatment) HPI Comments: The patient is a 23 year old female, history of Wolff-Parkinson-White syndrome status post ablation therapy who presents with recurrent and ongoing palpitations. She states this started at approximately 7:30 this morning, resolved around 12:30 PM. She was told by her cardiologist to come to the emergency department secondary to the tachycardia which would not resolve. She does have a loop recorder which was placed last month, she has initiated a tracing on this recorder several times since it was placed, she has a provisional diagnosis of POTS syndrome.  She endorses having associated lightheadedness and dizziness, denies any ingestions or illegal substance abuse. At this time her symptoms have completely resolved with regards to palpitations.  Patient is a 23 y.o. female presenting with palpitations and dizziness. The history is provided by the patient.  Palpitations Associated symptoms: dizziness   Dizziness Associated symptoms: palpitations     Past Medical History  Diagnosis Date  . WPW (Wolff-Parkinson-White syndrome)     s/p ablation (2011)  . Syncope     recurrent  . Vasovagal syncope   . POTS (postural orthostatic tachycardia syndrome)   . Palpitations     daily  . Hx of echocardiogram 04/2012    normal  . History of Holter monitoring 06/2013    "NSR, sinus tach, sinus arrythmia, one PVC, one PAC"  . Tilt table evaluation 02/2011    dx neurocardiogenic syncope   Past Surgical History  Procedure Laterality Date  . Cardiac electrophysiology study and ablation  2011    [facility]   No family history on file. History  Substance Use Topics  . Smoking status:  Never Smoker   . Smokeless tobacco: Not on file  . Alcohol Use: No   OB History    No data available     Review of Systems  Cardiovascular: Positive for palpitations.  Neurological: Positive for dizziness.  All other systems reviewed and are negative.     Allergies  Amitiza; Ketorolac tromethamine; and Metoclopramide  Home Medications   Prior to Admission medications   Medication Sig Start Date End Date Taking? Authorizing Provider  aspirin EC 81 MG tablet Take 81 mg by mouth daily.   Yes Historical Provider, MD  sodium chloride 1 G tablet Take 2 g by mouth once.    Yes Historical Provider, MD   BP 117/84 mmHg  Pulse 77  Temp(Src) 98.5 F (36.9 C) (Oral)  Resp 19  Ht 5\' 3"  (1.6 m)  Wt 131 lb (59.421 kg)  BMI 23.21 kg/m2  SpO2 100%  LMP 01/12/2014 Physical Exam  Constitutional: She appears well-developed and well-nourished. No distress.  HENT:  Head: Normocephalic and atraumatic.  Mouth/Throat: Oropharynx is clear and moist. No oropharyngeal exudate.  Eyes: Conjunctivae and EOM are normal. Pupils are equal, round, and reactive to light. Right eye exhibits no discharge. Left eye exhibits no discharge. No scleral icterus.  Neck: Normal range of motion. Neck supple. No JVD present. No thyromegaly present.  Cardiovascular: Normal rate, regular rhythm, normal heart sounds and intact distal pulses.  Exam reveals no gallop and no friction rub.   No murmur heard. Pulmonary/Chest: Effort normal and breath sounds normal. No respiratory distress. She has no wheezes. She has no  rales.  Abdominal: Soft. Bowel sounds are normal. She exhibits no distension and no mass. There is no tenderness.  Musculoskeletal: Normal range of motion. She exhibits no edema or tenderness.  Lymphadenopathy:    She has no cervical adenopathy.  Neurological: She is alert. Coordination normal.  Skin: Skin is warm and dry. No rash noted. No erythema.  Psychiatric: She has a normal mood and affect. Her  behavior is normal.  Nursing note and vitals reviewed.   ED Course  Procedures (including critical care time) Labs Review Labs Reviewed  CBC - Abnormal; Notable for the following:    RBC 3.74 (*)    Hemoglobin 10.5 (*)    HCT 33.1 (*)    All other components within normal limits  BASIC METABOLIC PANEL  I-STAT TROPOININ, ED  POC URINE PREG, ED    Imaging Review No results found.   EKG Interpretation   Date/Time:  Thursday February 20 2014 12:09:12 EST Ventricular Rate:  80 PR Interval:  123 QRS Duration: 73 QT Interval:  350 QTC Calculation: 404 R Axis:   81 Text Interpretation:  Sinus rhythm Normal ECG since last tracing no  significant change Confirmed by Taysen Bushart  MD, Clare Fennimore ([id number]) on 02/20/2014  1:19:49 PM      MDM   Final diagnoses:  Palpitations    Labs unremarkable, the patient appears well, there is no tachycardia or palpitations at this time. [facility].  Labs unremarkable, EKG unremarkable, I discussed care with the patient[facility] Dr. Cristi LoronBeatty, who recommends no acute intervention, the patient will be discharged to follow-up with him in the care clinic, no recurrent tachycardia while here. Patient in agreement  Vida RollerBrian D Burgess Sheriff, MD 02/20/14 [phone]

## 2014-02-20 NOTE — ED Notes (Signed)
Pt has history of POTS and WPW and woke up this am feeling funny and heart felt like it was beating funny.  Pt has medtronic loop recorder.  Pt denies pain but feels dizzy and lightheaded.  Pt has had an ablation in the past.

## 2014-02-24 ENCOUNTER — Emergency Department (HOSPITAL_COMMUNITY)
Admission: EM | Admit: 2014-02-24 | Discharge: 2014-02-24 | Disposition: A | Discharge status: Home / Self Care | Payer: BC Managed Care – PPO | Attending: Emergency Medicine | Admitting: Emergency Medicine

## 2014-02-24 ENCOUNTER — Encounter (HOSPITAL_COMMUNITY): Payer: Self-pay | Admitting: Emergency Medicine

## 2014-02-24 DIAGNOSIS — R55 Syncope and collapse: Secondary | ICD-10-CM | POA: Insufficient documentation

## 2014-02-24 DIAGNOSIS — Z7982 Long term (current) use of aspirin: Secondary | ICD-10-CM | POA: Insufficient documentation

## 2014-02-24 DIAGNOSIS — Z9889 Other specified postprocedural states: Secondary | ICD-10-CM | POA: Insufficient documentation

## 2014-02-24 DIAGNOSIS — R0789 Other chest pain: Secondary | ICD-10-CM | POA: Diagnosis not present

## 2014-02-24 DIAGNOSIS — R0602 Shortness of breath: Secondary | ICD-10-CM | POA: Diagnosis not present

## 2014-02-24 DIAGNOSIS — Z3202 Encounter for pregnancy test, result negative: Secondary | ICD-10-CM | POA: Diagnosis not present

## 2014-02-24 DIAGNOSIS — Z8679 Personal history of other diseases of the circulatory system: Secondary | ICD-10-CM | POA: Diagnosis not present

## 2014-02-24 LAB — CBC WITH DIFFERENTIAL/PLATELET
Basophils Absolute: 0 10*3/uL (ref 0.0–0.1)
Basophils Relative: 0 % (ref 0–1)
Eosinophils Absolute: 0.1 10*3/uL (ref 0.0–0.7)
Eosinophils Relative: 1 % (ref 0–5)
HCT: 34.4 % — ABNORMAL LOW (ref 36.0–46.0)
HEMOGLOBIN: 10.8 g/dL — AB (ref 12.0–15.0)
LYMPHS ABS: 2.1 10*3/uL (ref 0.7–4.0)
Lymphocytes Relative: 42 % (ref 12–46)
MCH: 27.3 pg (ref 26.0–34.0)
MCHC: 31.4 g/dL (ref 30.0–36.0)
MCV: 87.1 fL (ref 78.0–100.0)
MONOS PCT: 8 % (ref 3–12)
Monocytes Absolute: 0.4 10*3/uL (ref 0.1–1.0)
NEUTROS PCT: 49 % (ref 43–77)
Neutro Abs: 2.4 10*3/uL (ref 1.7–7.7)
Platelets: 253 10*3/uL (ref 150–400)
RBC: 3.95 MIL/uL (ref 3.87–5.11)
RDW: 12 % (ref 11.5–15.5)
WBC: 5 10*3/uL (ref 4.0–10.5)

## 2014-02-24 LAB — CBG MONITORING, ED
Glucose-Capillary: 66 mg/dL — ABNORMAL LOW (ref 70–99)
Glucose-Capillary: 107 mg/dL — ABNORMAL HIGH (ref 70–99)

## 2014-02-24 LAB — POC URINE PREG, ED: PREG TEST UR: NEGATIVE

## 2014-02-24 LAB — BASIC METABOLIC PANEL
Anion gap: 10 (ref 5–15)
BUN: 8 mg/dL (ref 6–23)
CO2: 26 mEq/L (ref 19–32)
Calcium: 9.4 mg/dL (ref 8.4–10.5)
Chloride: 107 mEq/L (ref 96–112)
Creatinine, Ser: 0.73 mg/dL (ref 0.50–1.10)
GFR calc Af Amer: >90 mL/min (ref >90)
GFR calc non Af Amer: >90 mL/min (ref >90)
GLUCOSE: 70 mg/dL (ref 70–99)
POTASSIUM: 4.5 meq/L (ref 3.7–5.3)
Sodium: 143 mEq/L (ref 137–147)

## 2014-02-24 MED ORDER — IBUPROFEN 400 MG PO TABS
600.0000 mg | ORAL_TABLET | Freq: Once | ORAL | Status: AC
  Administered 2014-02-24: 600 mg via ORAL
  Filled 2014-02-24 (×2): qty 1

## 2014-02-24 NOTE — ED Provider Notes (Signed)
CSN: [id number]     Arrival date & time 02/24/14  1143 History   First MD Initiated Contact with Patient 02/24/14 1146     No chief complaint on file.    (Consider location/radiation/quality/duration/timing/severity/associated sxs/prior Treatment) Patient is a 23 y.o. female presenting with syncope.  Loss of Consciousness Episode history:  Single Most recent episode:  Today Duration:  2 minutes Timing:  Intermittent Progression:  Resolved Chronicity:  Recurrent Context: not blood draw and not exertion   Witnessed: yes   Relieved by:  None tried Worsened by:  Nothing tried Ineffective treatments:  None tried Associated symptoms: shortness of breath   Associated symptoms: no anxiety, no chest pain, no confusion, no dizziness, no fever, no focal weakness, no headaches and no palpitations     Past Medical History  Diagnosis Date  . WPW (Wolff-Parkinson-White syndrome)     s/p ablation (2011)  . Syncope     recurrent  . Vasovagal syncope   . POTS (postural orthostatic tachycardia syndrome)   . Palpitations     daily  . Hx of echocardiogram 04/2012    normal  . History of Holter monitoring 06/2013    "NSR, sinus tach, sinus arrythmia, one PVC, one PAC"  . Tilt table evaluation 02/2011    dx neurocardiogenic syncope   Past Surgical History  Procedure Laterality Date  . Cardiac electrophysiology study and ablation  2011    [facility]   No family history on file. History  Substance Use Topics  . Smoking status: Never Smoker   . Smokeless tobacco: Not on file  . Alcohol Use: No   OB History    No data available     Review of Systems  Constitutional: Negative for fever, chills and activity change.  HENT: Negative for congestion and facial swelling.   Eyes: Negative for pain, discharge and redness.  Respiratory: Positive for chest tightness and shortness of breath. Negative for cough.   Cardiovascular: Positive for syncope. Negative for chest pain and  palpitations.  Gastrointestinal: Negative for abdominal pain and abdominal distention.  Endocrine: Negative for polydipsia and polyuria.  Genitourinary: Negative for dysuria and menstrual problem.  Musculoskeletal: Negative for back pain and joint swelling.  Skin: Negative for color change and wound.  Neurological: Positive for syncope. Negative for dizziness, focal weakness, light-headedness and headaches.  Psychiatric/Behavioral: Negative for confusion.      Allergies  Amitiza; Ketorolac tromethamine; and Metoclopramide  Home Medications   Prior to Admission medications   Medication Sig Start Date End Date Taking? Authorizing Provider  aspirin EC 81 MG tablet Take 81 mg by mouth daily.    Historical Provider, MD  sodium chloride 1 G tablet Take 2 g by mouth once.     Historical Provider, MD   LMP 01/12/2014 Physical Exam  Constitutional: She is oriented to person, place, and time. She appears well-developed and well-nourished.  HENT:  Head: Normocephalic and atraumatic.  Eyes: Conjunctivae and EOM are normal. Right eye exhibits no discharge. Left eye exhibits no discharge.  Cardiovascular: Normal rate and regular rhythm.   Pulmonary/Chest: Effort normal and breath sounds normal. No respiratory distress.  Abdominal: Soft. She exhibits no distension. There is no tenderness. There is no rebound.  Musculoskeletal: Normal range of motion. She exhibits no edema or tenderness.  Neurological: She is alert and oriented to person, place, and time.  No altered mental status, able to give full seemingly accurate history.  Face is symmetric, EOM's intact, pupils equal and  reactive, vision intact, tongue and uvula midline without deviation Upper and Lower extremity motor 5/5, intact pain perception in distal extremities, 2+ reflexes in biceps, patella and achilles tendons. Finger to nose normal, heel to shin normal. Walks without assistance or evident ataxia.   Skin: Skin is warm and dry.   Nursing note and vitals reviewed.   ED Course  Procedures (including critical care time) Labs Review Labs Reviewed - No data to display  Imaging Review No results found.   EKG Interpretation None      MDM   Final diagnoses:  None    23 yo F w/ h/o WPW s/p ablation, multiple episodes of syncope here with an episode of syncope. Loop recorder in place and button pushed prior to event. Had some sob/chest tightness/light headed just prior, improved now. Loop recorder reviewed without evidence of severe tachycardia or other arrhythmias. Labs and ecg similar to prior. Neuro exam similar to prior. Glucose rechecked aand came up appropriately with food. Syncope likely similar to what is already being worked up by PCP and cardiologist. Touched base with cardiology team (her cardiologist, Dr. Cristi LoronBeatty is currently out of town) and they will move follow up sooner. Plan d/w patient and is stable for dc w/ close fu, will return here for new/worsening/recurrent symptoms.     Marily MemosJason Bernardina Cacho, MD 02/24/14 1620  Audree CamelScott T Goldston, MD 02/27/14 [phone]

## 2014-02-24 NOTE — ED Notes (Signed)
Pt eating at this time. Will check CBG after finished eating. Informed pt to make us aware when she is finished.

## 2014-02-24 NOTE — ED Notes (Signed)
To ED via [facility] from home with c/o syncopal episode while at home, pt has hx of WPW -- wearing LOOP RECORDER-- was here on Thursday for similar episode. [facility]. Episode today, pt was talking to mom and became dizzy, with chest tightness, difficulty breathing, pt states pressed the button on recorder prior to passing out. Pt's mother's CNA called 911. On EMS arrival to the home, pt was awake and alert.

## 2014-02-24 NOTE — ED Notes (Signed)
CBG 107 

## 2014-02-25 ENCOUNTER — Encounter (HOSPITAL_COMMUNITY): Payer: Self-pay | Admitting: Emergency Medicine

## 2014-02-25 ENCOUNTER — Emergency Department (HOSPITAL_COMMUNITY): Payer: BC Managed Care – PPO

## 2014-02-25 ENCOUNTER — Emergency Department (HOSPITAL_COMMUNITY)
Admission: EM | Admit: 2014-02-25 | Discharge: 2014-02-25 | Disposition: A | Discharge status: Home / Self Care | Payer: BC Managed Care – PPO | Attending: Emergency Medicine | Admitting: Emergency Medicine

## 2014-02-25 DIAGNOSIS — R55 Syncope and collapse: Secondary | ICD-10-CM | POA: Diagnosis not present

## 2014-02-25 DIAGNOSIS — I498 Other specified cardiac arrhythmias: Secondary | ICD-10-CM | POA: Insufficient documentation

## 2014-02-25 DIAGNOSIS — Z7982 Long term (current) use of aspirin: Secondary | ICD-10-CM | POA: Diagnosis not present

## 2014-02-25 DIAGNOSIS — R079 Chest pain, unspecified: Secondary | ICD-10-CM | POA: Diagnosis present

## 2014-02-25 DIAGNOSIS — Z9889 Other specified postprocedural states: Secondary | ICD-10-CM | POA: Diagnosis not present

## 2014-02-25 LAB — BASIC METABOLIC PANEL
Anion gap: 13 (ref 5–15)
BUN: 9 mg/dL (ref 6–23)
CO2: 25 meq/L (ref 19–32)
Calcium: 9.4 mg/dL (ref 8.4–10.5)
Chloride: 104 mEq/L (ref 96–112)
Creatinine, Ser: 0.72 mg/dL (ref 0.50–1.10)
GFR calc Af Amer: >90 mL/min (ref >90)
GFR calc non Af Amer: >90 mL/min (ref >90)
GLUCOSE: 79 mg/dL (ref 70–99)
Potassium: 4.1 mEq/L (ref 3.7–5.3)
Sodium: 142 mEq/L (ref 137–147)

## 2014-02-25 LAB — I-STAT TROPONIN, ED: Troponin i, poc: 0 ng/mL (ref 0.00–0.08)

## 2014-02-25 LAB — CBC
HCT: 35.1 % — ABNORMAL LOW (ref 36.0–46.0)
Hemoglobin: 11.2 g/dL — ABNORMAL LOW (ref 12.0–15.0)
MCH: 27.7 pg (ref 26.0–34.0)
MCHC: 31.9 g/dL (ref 30.0–36.0)
MCV: 86.7 fL (ref 78.0–100.0)
Platelets: 252 10*3/uL (ref 150–400)
RBC: 4.05 MIL/uL (ref 3.87–5.11)
RDW: 12.1 % (ref 11.5–15.5)
WBC: 7.1 10*3/uL (ref 4.0–10.5)

## 2014-02-25 NOTE — Discharge Instructions (Signed)
As discussed, your evaluation today has been largely reassuring.  But, it is important that you monitor your condition carefully, and do not hesitate to return to the ED if you develop new, or concerning changes in your condition. ? ?Otherwise, please follow-up with your physician for appropriate ongoing care. ? ?

## 2014-02-25 NOTE — ED Provider Notes (Signed)
CSN: [id number]     Arrival date & time 02/25/14  1629 History   First MD Initiated Contact with Patient 02/25/14 1637     Chief Complaint  Patient presents with  . Chest Pain     HPI  Patient presents after an episode of syncope. Patient recalls that after a brief prodrome.  My including mild chest tightness, she felt lightheaded, had syncope. Upon awakening she had no substantial pain, discomfort, lightheadedness, confusion. No clear precipitant, though the patient was standing when the symptoms occurred. Patient has a history of Wolff-Parkinson-White,POTS, and has follow-up care at a local tertiary care facility. Patient was seen here yesterday for similar event.  She states that yesterday she did not have full loss of consciousness, differentiating between today's event.   Past Medical History  Diagnosis Date  . WPW (Wolff-Parkinson-White syndrome)     s/p ablation (2011)  . Syncope     recurrent  . Vasovagal syncope   . POTS (postural orthostatic tachycardia syndrome)   . Palpitations     daily  . Hx of echocardiogram 04/2012    normal  . History of Holter monitoring 06/2013    "NSR, sinus tach, sinus arrythmia, one PVC, one PAC"  . Tilt table evaluation 02/2011    dx neurocardiogenic syncope   Past Surgical History  Procedure Laterality Date  . Cardiac electrophysiology study and ablation  2011    [facility]   No family history on file. History  Substance Use Topics  . Smoking status: Never Smoker   . Smokeless tobacco: Not on file  . Alcohol Use: No   OB History    No data available     Review of Systems  Constitutional:       Per HPI, otherwise negative  HENT:       Per HPI, otherwise negative  Respiratory:       Per HPI, otherwise negative  Cardiovascular:       Per HPI, otherwise negative  Gastrointestinal: Negative for vomiting.  Endocrine:       Negative aside from HPI  Genitourinary:       Neg aside from HPI   Musculoskeletal:        Per HPI, otherwise negative  Skin: Negative.   Neurological: Positive for syncope and light-headedness.       Allergies  Amitiza; Ketorolac tromethamine; and Metoclopramide  Home Medications   Prior to Admission medications   Medication Sig Start Date End Date Taking? Authorizing Provider  aspirin EC 81 MG tablet Take 81 mg by mouth daily.    Historical Provider, MD  sodium chloride 1 G tablet Take 2 g by mouth once.     Historical Provider, MD   BP 113/78 mmHg  Pulse 96  Temp(Src) 98.3 F (36.8 C) (Oral)  Resp 20  Ht 5\' 3"  (1.6 m)  Wt 131 lb (59.421 kg)  BMI 23.21 kg/m2  SpO2 100%  LMP 01/12/2014 Physical Exam  Constitutional: She is oriented to person, place, and time. She appears well-developed and well-nourished. No distress.  HENT:  Head: Normocephalic and atraumatic.  Eyes: Conjunctivae and EOM are normal.  Cardiovascular: Normal rate and regular rhythm.   Pulmonary/Chest: Effort normal and breath sounds normal. No stridor. No respiratory distress.  Implanted loop recorder, no notable findings  Abdominal: She exhibits no distension.  Musculoskeletal: She exhibits no edema.  Neurological: She is alert and oriented to person, place, and time. No cranial nerve deficit. She exhibits normal muscle  tone. Coordination normal.  Skin: Skin is warm and dry.  Psychiatric: She has a normal mood and affect.  Nursing note and vitals reviewed.   ED Course  Procedures (including critical care time) Labs Review Labs Reviewed  CBC  BASIC METABOLIC PANEL  PREGNANCY, URINE  I-STAT TROPOININ, ED    Imaging Review No results found.   EKG Interpretation   Date/Time:  Tuesday February 25 2014 16:31:54 EST Ventricular Rate:  95 PR Interval:  123 QRS Duration: 75 QT Interval:  339 QTC Calculation: 426 R Axis:   70 Text Interpretation:  Sinus rhythm Atrial premature complex Sinus rhythm  Premature atrial complexes Artifact Abnormal ekg Confirmed by Gerhard MunchLOCKWOOD,   Jaskiran Pata  MD [phone]([id number]) on 02/25/2014 5:09:37 PM      Interrogation of loop recorder shows no substantial abnormalities. The patient triggered 2 periods for recording specifically about the time of her CT.  Each.  Had a ventricular rate between 100  Update: I discussed the patient's case with her cardiology team at her primary care facility. Patient will have follow-up there.  MDM   Final diagnoses:  Syncope and collapse   Patient presents after an episode of syncope.  Patient has multiple medical issues, including POTS, [employer] Parkinson White. Patient has had multiple prior similar events including one yesterday. Patient's evaluation tonight is similarly reassuring.  After discussion with her cardiology team, she was discharged in stable condition, per the recommendation to follow-up at their clinic.    Gerhard Munchobert Maidie Streight, MD 02/25/14 [phone]

## 2014-02-25 NOTE — ED Notes (Signed)
Per EMS: pt with hx of Wolff-parkinsons white, states that she started having chest tightness today with no relief. Pt was seen here yesterday for same and was discharged home, pt does have implanted loop recorder noted currently. EMS gave 324mg  of aspirin and 1 nitro with no relief. Pt rates pain 8/10, nad noted. Pt axo x4.

## 2014-03-04 ENCOUNTER — Emergency Department (HOSPITAL_COMMUNITY)
Admission: EM | Admit: 2014-03-04 | Discharge: 2014-03-04 | Disposition: A | Discharge status: Home / Self Care | Payer: BC Managed Care – PPO | Attending: Emergency Medicine | Admitting: Emergency Medicine

## 2014-03-04 ENCOUNTER — Emergency Department (HOSPITAL_COMMUNITY): Payer: BC Managed Care – PPO

## 2014-03-04 ENCOUNTER — Encounter (HOSPITAL_COMMUNITY): Payer: Self-pay | Admitting: Emergency Medicine

## 2014-03-04 DIAGNOSIS — Z7982 Long term (current) use of aspirin: Secondary | ICD-10-CM | POA: Diagnosis not present

## 2014-03-04 DIAGNOSIS — Z79899 Other long term (current) drug therapy: Secondary | ICD-10-CM | POA: Insufficient documentation

## 2014-03-04 DIAGNOSIS — R079 Chest pain, unspecified: Secondary | ICD-10-CM | POA: Diagnosis present

## 2014-03-04 DIAGNOSIS — R0789 Other chest pain: Secondary | ICD-10-CM | POA: Insufficient documentation

## 2014-03-04 DIAGNOSIS — Z8679 Personal history of other diseases of the circulatory system: Secondary | ICD-10-CM | POA: Insufficient documentation

## 2014-03-04 LAB — CBC WITH DIFFERENTIAL/PLATELET
Basophils Absolute: 0 10*3/uL (ref 0.0–0.1)
Basophils Relative: 0 % (ref 0–1)
Eosinophils Absolute: 0 10*3/uL (ref 0.0–0.7)
Eosinophils Relative: 0 % (ref 0–5)
HCT: 35 % — ABNORMAL LOW (ref 36.0–46.0)
Hemoglobin: 11 g/dL — ABNORMAL LOW (ref 12.0–15.0)
LYMPHS ABS: 1.8 10*3/uL (ref 0.7–4.0)
LYMPHS PCT: 20 % (ref 12–46)
MCH: 27.4 pg (ref 26.0–34.0)
MCHC: 31.4 g/dL (ref 30.0–36.0)
MCV: 87.3 fL (ref 78.0–100.0)
Monocytes Absolute: 0.5 10*3/uL (ref 0.1–1.0)
Monocytes Relative: 6 % (ref 3–12)
NEUTROS PCT: 74 % (ref 43–77)
Neutro Abs: 6.5 10*3/uL (ref 1.7–7.7)
Platelets: 264 10*3/uL (ref 150–400)
RBC: 4.01 MIL/uL (ref 3.87–5.11)
RDW: 12.1 % (ref 11.5–15.5)
WBC: 8.8 10*3/uL (ref 4.0–10.5)

## 2014-03-04 LAB — D-DIMER, QUANTITATIVE (NOT AT ARMC): D DIMER QUANT: 0.33 ug{FEU}/mL (ref 0.00–0.48)

## 2014-03-04 LAB — I-STAT TROPONIN, ED
Troponin i, poc: 0 ng/mL (ref 0.00–0.08)
Troponin i, poc: 0 ng/mL (ref 0.00–0.08)

## 2014-03-04 LAB — COMPREHENSIVE METABOLIC PANEL
ALT: 8 U/L (ref 0–35)
AST: 16 U/L (ref 0–37)
Albumin: 4.6 g/dL (ref 3.5–5.2)
Alkaline Phosphatase: 58 U/L (ref 39–117)
Anion gap: 15 (ref 5–15)
BUN: 8 mg/dL (ref 6–23)
CALCIUM: 10 mg/dL (ref 8.4–10.5)
CO2: 24 mEq/L (ref 19–32)
Chloride: 101 mEq/L (ref 96–112)
Creatinine, Ser: 0.67 mg/dL (ref 0.50–1.10)
GFR calc non Af Amer: >90 mL/min (ref >90)
GLUCOSE: 99 mg/dL (ref 70–99)
Potassium: 4.4 mEq/L (ref 3.7–5.3)
SODIUM: 140 meq/L (ref 137–147)
TOTAL PROTEIN: 7.9 g/dL (ref 6.0–8.3)
Total Bilirubin: 0.2 mg/dL — ABNORMAL LOW (ref 0.3–1.2)

## 2014-03-04 MED ORDER — CYCLOBENZAPRINE HCL 10 MG PO TABS
10.0000 mg | ORAL_TABLET | Freq: Once | ORAL | Status: AC
  Administered 2014-03-04: 10 mg via ORAL
  Filled 2014-03-04: qty 1

## 2014-03-04 MED ORDER — IBUPROFEN 200 MG PO TABS
600.0000 mg | ORAL_TABLET | Freq: Once | ORAL | Status: AC
  Administered 2014-03-04: 600 mg via ORAL
  Filled 2014-03-04: qty 3

## 2014-03-04 MED ORDER — TRAMADOL HCL 50 MG PO TABS: 50.0000 mg | ORAL_TABLET | Freq: Four times a day (QID) | ORAL | Status: DC | PRN

## 2014-03-04 NOTE — Progress Notes (Signed)
ED CM noted patient to have had 10 ED visits in the last 6 months. Patient presented to [facility] ED with CP PMH Wolff-Parkinson-White syndrome and POTS syndrome reports she had a ablation done in 2011.   Met with patient at bedside to confirm information. Patient has Orthoptist She sees a  Film/video editor in [employer] Dr. Corine Shelter and she stated, that she spoke with him today about her symptoms  and was advised to come to the ED. Patient confirms that she does not have a PCP, offered to assist with finding a PCP patient is agreeable. Discussed the benefits and importance of a PCP, and reviewed the signs and symptoms of when to seek emergency care. Patient verbalized understanding teach back done. Discussed the [facility] and services that they provide. Patient is agreeable. CM will contact [facility] schedule tomorrow and will call patient at the number verified in the record with appt. Updated patient and Ashura RN on Pod A. ED CM will f/u with patient on the  PCP appt tomorrow.

## 2014-03-04 NOTE — Discharge Instructions (Signed)
As discussed, it is important that you follow up as soon as possible with your physician for continued management of your condition. ° °If you develop any new, or concerning changes in your condition, please return to the emergency department immediately. ° ° °Chest Pain (Nonspecific) °It is often hard to give a diagnosis for the cause of chest pain. There is always a chance that your pain could be related to something serious, such as a heart attack or a blood clot in the lungs. You need to follow up with your doctor. °HOME CARE °· If antibiotic medicine was given, take it as directed by your doctor. Finish the medicine even if you start to feel better. °· For the next few days, avoid activities that bring on chest pain. Continue physical activities as told by your doctor. °· Do not use any tobacco products. This includes cigarettes, chewing tobacco, and e-cigarettes. °· Avoid drinking alcohol. °· Only take medicine as told by your doctor. °· Follow your doctor's suggestions for more testing if your chest pain does not go away. °· Keep all doctor visits you made. °GET HELP IF: °· Your chest pain does not go away, even after treatment. °· You have a rash with blisters on your chest. °· You have a fever. °GET HELP RIGHT AWAY IF:  °· You have more pain or pain that spreads to your arm, neck, jaw, back, or belly (abdomen). °· You have shortness of breath. °· You cough more than usual or cough up blood. °· You have very bad back or belly pain. °· You feel sick to your stomach (nauseous) or throw up (vomit). °· You have very bad weakness. °· You pass out (faint). °· You have chills. °This is an emergency. Do not wait to see if the problems will go away. Call your local emergency services (911 in [place] Do not drive yourself to the hospital. °MAKE SURE YOU:  °· Understand these instructions. °· Will watch your condition. °· Will get help right away if you are not doing well or get worse. °Document Released: 09/07/2007  Document Revised: 03/26/2013 Document Reviewed: 09/07/2007 °ExitCare® Patient Information ©2015 [employer]. This information is not intended to replace advice given to you by your health care provider. Make sure you discuss any questions you have with your health care provider. ° °

## 2014-03-04 NOTE — ED Notes (Addendum)
Pt diaphoretic at this time. Complaining of chest pain. No relief from medications given earlier. MD notified.

## 2014-03-04 NOTE — ED Notes (Signed)
Case management at bedside.

## 2014-03-04 NOTE — ED Provider Notes (Signed)
6:22 PM Patient awake and alert, ambulating without difficulty. No evidence for distress.  Vital signs remained stable. I discussed the patient's loop recorder information with coming representative.  No events recorded today, or since the patient's last evaluation here two weeks ago (when I took care of the patient). Second troponin normal.  Per sign-out, the patient was d/c to f/u with her cardiologist.   Gerhard Munchobert Vira Chaplin, MD 03/04/14 [phone]

## 2014-03-04 NOTE — ED Notes (Addendum)
Chest tightness and pressure worse with inspiration and expiration. Been seen multiple. Cardiologist has been sending her home but then she has to come right back. Collapses with it. Wolfe Parkinsons White syndrome. Has not passed out today but felt chest pain radiating to back and tingling into left fingers.

## 2014-03-04 NOTE — ED Notes (Signed)
To x-ray

## 2014-03-04 NOTE — ED Notes (Signed)
Notified charge RN to interrogate pt's medtronic loop recorder

## 2014-03-04 NOTE — ED Notes (Signed)
MD at bedside. 

## 2014-03-04 NOTE — ED Notes (Signed)
pts labs sent down to main lab pt had cardiac labs drawn. pts istat is on hold in mini lab.

## 2014-03-04 NOTE — ED Provider Notes (Signed)
CSN: [id number]     Arrival date & time 03/04/14  1210 History   First MD Initiated Contact with Patient 03/04/14 1325     Chief Complaint  Patient presents with  . Chest Pain     (Consider location/radiation/quality/duration/timing/severity/associated sxs/prior Treatment) HPI  Patient reports she was diagnosed with Wolff-Parkinson-White syndrome and POTS syndrome reports she had a ablation done in 2011. However about a few months after she had the ablated and done she started having the same symptoms that she had before. She states for the last  month she has been getting chest pain. She states she is come to the ER daily for the last 2 weeks for chest pain. She states the pain is in the left to center of her chest and is described as pressure and squeezing. She states today it started while she was in class at 1040. She states the pain is still present. She had nausea without vomiting, no diaphoresis, she was short of breath which is improved. She did not have palpitations today. She denies feeling faint or dizzy or having loss of consciousness. She states taking a big deep breath makes the pain worse, nothing makes it feel better. She states her current pain as an 8 out of 10 and at its worse it was an 8 out of 10. She states she called her cardiologist today and he is going to move her appointment up from January to December. Patient denies being on any birth control hormones or having pain or swelling in her legs. Patient has had a loop recorder for about 2 months.   Card[place] [place]  Past Medical History  Diagnosis Date  . WPW (Wolff-Parkinson-White syndrome)     s/p ablation (2011)  . Syncope     recurrent  . Vasovagal syncope   . POTS (postural orthostatic tachycardia syndrome)   . Palpitations     daily  . Hx of echocardiogram 04/2012    normal  . History of Holter monitoring 06/2013    "NSR, sinus tach, sinus arrythmia, one PVC, one PAC"  . Tilt table evaluation  02/2011    dx neurocardiogenic syncope   Past Surgical History  Procedure Laterality Date  . Cardiac electrophysiology study and ablation  2011    [facility]   History reviewed. No pertinent family history. History  Substance Use Topics  . Smoking status: Never Smoker   . Smokeless tobacco: Not on file  . Alcohol Use: No  Jr in college at [employer] studying psychology   OB History    No data available     Review of Systems  All other systems reviewed and are negative.     Allergies  Amitiza; Ketorolac tromethamine; and Metoclopramide  Patient states she can take Motrin and Aleve over-the-counter  Home Medications   Prior to Admission medications   Medication Sig Start Date End Date Taking? Authorizing Provider  aspirin EC 81 MG tablet Take 81 mg by mouth daily.   Yes Historical Provider, MD  sodium chloride 1 G tablet Take 1 g by mouth 2 (two) times daily.    Yes Historical Provider, MD   BP 108/71 mmHg  Pulse 95  Resp 14  SpO2 100%  LMP 02/15/2014  Vital signs normal   Physical Exam  Constitutional: She is oriented to person, place, and time. She appears well-developed and well-nourished.  Non-toxic appearance. She does not appear ill. No distress.  HENT:  Head: Normocephalic and atraumatic.  Right Ear: External ear normal.  Left Ear: External ear normal.  Nose: Nose normal. No mucosal edema or rhinorrhea.  Mouth/Throat: Oropharynx is clear and moist and mucous membranes are normal. No dental abscesses or uvula swelling.  Eyes: Conjunctivae and EOM are normal. Pupils are equal, round, and reactive to light.  Neck: Normal range of motion and full passive range of motion without pain. Neck supple.  Cardiovascular: Normal rate, regular rhythm and normal heart sounds.  Exam reveals no gallop and no friction rub.   No murmur heard. Pulmonary/Chest: Effort normal and breath sounds normal. No respiratory distress. She has no wheezes. She has no rhonchi. She  has no rales. She exhibits no tenderness and no crepitus.    Area of pain noted  Abdominal: Soft. Normal appearance and bowel sounds are normal. She exhibits no distension. There is no tenderness. There is no rebound and no guarding.  Musculoskeletal: Normal range of motion. She exhibits no edema or tenderness.  Moves all extremities well.   Neurological: She is alert and oriented to person, place, and time. She has normal strength. No cranial nerve deficit.  Skin: Skin is warm, dry and intact. No rash noted. No erythema. No pallor.  Psychiatric: She has a normal mood and affect. Her speech is normal and behavior is normal. Her mood appears not anxious.  Nursing note and vitals reviewed.   ED Course  Procedures (including critical care time)  Medications  ibuprofen (ADVIL,MOTRIN) tablet 600 mg (600 mg Oral Given 03/04/14 1455)  cyclobenzaprine (FLEXERIL) tablet 10 mg (10 mg Oral Given 03/04/14 1456)    1550 charge nurse just now going to room to interrogate her loop recorder.  Patient is waiting for second troponin and results of her loop recorder results at change of shift at 1600. Dr. Jeraldine LootsLockwood will follow up.   Labs Review Results for orders placed or performed during the hospital encounter of 03/04/14  Comprehensive metabolic panel  Result Value Ref Range   Sodium 140 137 - 147 mEq/L   Potassium 4.4 3.7 - 5.3 mEq/L   Chloride 101 96 - 112 mEq/L   CO2 24 19 - 32 mEq/L   Glucose, Bld 99 70 - 99 mg/dL   BUN 8 6 - 23 mg/dL   Creatinine, Ser 1.610.67 0.50 - 1.10 mg/dL   Calcium 09.610.0 8.4 - 04.510.5 mg/dL   Total Protein 7.9 6.0 - 8.3 g/dL   Albumin 4.6 3.5 - 5.2 g/dL   AST 16 0 - 37 U/L   ALT 8 0 - 35 U/L   Alkaline Phosphatase 58 39 - 117 U/L   Total Bilirubin 0.2 (L) 0.3 - 1.2 mg/dL   GFR calc non Af Amer >90 >90 mL/min   GFR calc Af Amer >90 >90 mL/min   Anion gap 15 5 - 15  CBC with Differential  Result Value Ref Range   WBC 8.8 4.0 - 10.5 K/uL   RBC 4.01 3.87 - 5.11 MIL/uL     Hemoglobin 11.0 (L) 12.0 - 15.0 g/dL   HCT 40.935.0 (L) 81.136.0 - 91.446.0 %   MCV 87.3 78.0 - 100.0 fL   MCH 27.4 26.0 - 34.0 pg   MCHC 31.4 30.0 - 36.0 g/dL   RDW 78.212.1 95.611.5 - 21.315.5 %   Platelets 264 150 - 400 K/uL   Neutrophils Relative % 74 43 - 77 %   Neutro Abs 6.5 1.7 - 7.7 K/uL   Lymphocytes Relative 20 12 - 46 %   Lymphs Abs  1.8 0.7 - 4.0 K/uL   Monocytes Relative 6 3 - 12 %   Monocytes Absolute 0.5 0.1 - 1.0 K/uL   Eosinophils Relative 0 0 - 5 %   Eosinophils Absolute 0.0 0.0 - 0.7 K/uL   Basophils Relative 0 0 - 1 %   Basophils Absolute 0.0 0.0 - 0.1 K/uL  D-dimer, quantitative  Result Value Ref Range   D-Dimer, Quant 0.33 0.00 - 0.48 ug/mL-FEU  I-Stat Troponin, ED (not at [facility])  Result Value Ref Range   Troponin i, poc 0.00 0.00 - 0.08 ng/mL   Comment 3           Laboratory interpretation all normal except mild stable anemia     Imaging Review Dg Chest 2 View  03/04/2014   CLINICAL DATA:  Left-sided chest pain for 1 day. History of Wolff Parkinson White syndrome  EXAM: CHEST  2 VIEW  COMPARISON:  February 25, 2014  FINDINGS: Lungs are clear. Heart size and pulmonary vascularity are normal. No adenopathy. No pneumothorax. No bone lesions. Monitor device present anteriorly.  IMPRESSION: No edema or consolidation.   Electronically Signed   By: Bretta Bang M.D.   On: 03/04/2014 15:07     EKG Interpretation   Date/Time:  Tuesday March 04 2014 12:15:23 EST Ventricular Rate:  101 PR Interval:  128 QRS Duration: 88 QT Interval:  326 QTC Calculation: 422 R Axis:   86 Text Interpretation:  Sinus tachycardia Since last tracing 25 Feb 2014 T  wave abnormality, consider inferior ischemia Confirmed by Helmut Hennon  MD-I, Lottie Sigman  ([id number]) on 03/04/2014 2:14:09 PM      MDM   Final diagnoses:  Atypical chest pain   Disposition pending per Dr Jeraldine Loots (most likely discharge)   Devoria Albe, MD, Franz Dell, MD 03/04/14 [phone]

## 2014-03-12 ENCOUNTER — Emergency Department (HOSPITAL_COMMUNITY): Payer: BC Managed Care – PPO

## 2014-03-12 ENCOUNTER — Emergency Department (HOSPITAL_COMMUNITY)
Admission: EM | Admit: 2014-03-12 | Discharge: 2014-03-12 | Disposition: A | Discharge status: Home / Self Care | Payer: BC Managed Care – PPO | Attending: Emergency Medicine | Admitting: Emergency Medicine

## 2014-03-12 ENCOUNTER — Encounter (HOSPITAL_COMMUNITY): Payer: Self-pay | Admitting: Emergency Medicine

## 2014-03-12 DIAGNOSIS — Z3202 Encounter for pregnancy test, result negative: Secondary | ICD-10-CM | POA: Insufficient documentation

## 2014-03-12 DIAGNOSIS — Y9222 Religious institution as the place of occurrence of the external cause: Secondary | ICD-10-CM | POA: Insufficient documentation

## 2014-03-12 DIAGNOSIS — R55 Syncope and collapse: Secondary | ICD-10-CM | POA: Insufficient documentation

## 2014-03-12 DIAGNOSIS — Y998 Other external cause status: Secondary | ICD-10-CM | POA: Insufficient documentation

## 2014-03-12 DIAGNOSIS — S0990XA Unspecified injury of head, initial encounter: Secondary | ICD-10-CM | POA: Insufficient documentation

## 2014-03-12 DIAGNOSIS — Z7982 Long term (current) use of aspirin: Secondary | ICD-10-CM | POA: Insufficient documentation

## 2014-03-12 DIAGNOSIS — W1839XA Other fall on same level, initial encounter: Secondary | ICD-10-CM | POA: Insufficient documentation

## 2014-03-12 DIAGNOSIS — Y9389 Activity, other specified: Secondary | ICD-10-CM | POA: Insufficient documentation

## 2014-03-12 DIAGNOSIS — S199XXA Unspecified injury of neck, initial encounter: Secondary | ICD-10-CM | POA: Diagnosis present

## 2014-03-12 DIAGNOSIS — I456 Pre-excitation syndrome: Secondary | ICD-10-CM | POA: Diagnosis not present

## 2014-03-12 DIAGNOSIS — Z79899 Other long term (current) drug therapy: Secondary | ICD-10-CM | POA: Insufficient documentation

## 2014-03-12 DIAGNOSIS — W19XXXA Unspecified fall, initial encounter: Secondary | ICD-10-CM

## 2014-03-12 LAB — COMPREHENSIVE METABOLIC PANEL
ALBUMIN: 4.1 g/dL (ref 3.5–5.2)
ALT: 8 U/L (ref 0–35)
AST: 16 U/L (ref 0–37)
Alkaline Phosphatase: 54 U/L (ref 39–117)
Anion gap: 11 (ref 5–15)
BILIRUBIN TOTAL: 0.3 mg/dL (ref 0.3–1.2)
BUN: 10 mg/dL (ref 6–23)
CHLORIDE: 104 meq/L (ref 96–112)
CO2: 25 mEq/L (ref 19–32)
Calcium: 9.5 mg/dL (ref 8.4–10.5)
Creatinine, Ser: 0.7 mg/dL (ref 0.50–1.10)
GFR calc Af Amer: >90 mL/min (ref >90)
GFR calc non Af Amer: >90 mL/min (ref >90)
Glucose, Bld: 75 mg/dL (ref 70–99)
Potassium: 4.4 mEq/L (ref 3.7–5.3)
SODIUM: 140 meq/L (ref 137–147)
Total Protein: 7.4 g/dL (ref 6.0–8.3)

## 2014-03-12 LAB — CBG MONITORING, ED: GLUCOSE-CAPILLARY: 78 mg/dL (ref 70–99)

## 2014-03-12 LAB — CBC WITH DIFFERENTIAL/PLATELET
BASOS ABS: 0 10*3/uL (ref 0.0–0.1)
Basophils Relative: 0 % (ref 0–1)
Eosinophils Absolute: 0 10*3/uL (ref 0.0–0.7)
Eosinophils Relative: 1 % (ref 0–5)
HCT: 35.5 % — ABNORMAL LOW (ref 36.0–46.0)
Hemoglobin: 11.2 g/dL — ABNORMAL LOW (ref 12.0–15.0)
Lymphocytes Relative: 41 % (ref 12–46)
Lymphs Abs: 2.8 10*3/uL (ref 0.7–4.0)
MCH: 27.8 pg (ref 26.0–34.0)
MCHC: 31.5 g/dL (ref 30.0–36.0)
MCV: 88.1 fL (ref 78.0–100.0)
MONO ABS: 0.5 10*3/uL (ref 0.1–1.0)
Monocytes Relative: 8 % (ref 3–12)
NEUTROS ABS: 3.5 10*3/uL (ref 1.7–7.7)
Neutrophils Relative %: 50 % (ref 43–77)
PLATELETS: 231 10*3/uL (ref 150–400)
RBC: 4.03 MIL/uL (ref 3.87–5.11)
RDW: 12 % (ref 11.5–15.5)
WBC: 6.8 10*3/uL (ref 4.0–10.5)

## 2014-03-12 LAB — URINALYSIS, ROUTINE W REFLEX MICROSCOPIC
Bilirubin Urine: NEGATIVE
Glucose, UA: NEGATIVE mg/dL
HGB URINE DIPSTICK: NEGATIVE
Ketones, ur: NEGATIVE mg/dL
Leukocytes, UA: NEGATIVE
NITRITE: NEGATIVE
Protein, ur: NEGATIVE mg/dL
SPECIFIC GRAVITY, URINE: 1.007 (ref 1.005–1.030)
UROBILINOGEN UA: 0.2 mg/dL (ref 0.0–1.0)
pH: 6 (ref 5.0–8.0)

## 2014-03-12 LAB — I-STAT TROPONIN, ED: Troponin i, poc: 0 ng/mL (ref 0.00–0.08)

## 2014-03-12 LAB — PREGNANCY, URINE: PREG TEST UR: NEGATIVE

## 2014-03-12 MED ORDER — IBUPROFEN 400 MG PO TABS
600.0000 mg | ORAL_TABLET | Freq: Once | ORAL | Status: AC
  Administered 2014-03-12: 600 mg via ORAL
  Filled 2014-03-12 (×2): qty 1

## 2014-03-12 NOTE — ED Notes (Signed)
Per Loop recorder rep, Brett CanalesMitch Birnbaum, last checked Dec 1st, no new episodes were detected based on programming. Average 100bpm.

## 2014-03-12 NOTE — ED Notes (Signed)
Loop recorder to be interrogated by [employer] RN

## 2014-03-12 NOTE — ED Notes (Signed)
Heart rate noted on monitor to increase from 105 to 166. Assessed patient she was on the phone and stated she was not feeling well. Talked with patient and reassessed vitals. Patient feeling better. EDP made aware.

## 2014-03-12 NOTE — ED Provider Notes (Signed)
CSN: [id number]     Arrival date & time 03/12/14  1357 History   First MD Initiated Contact with Patient 03/12/14 1503     Chief Complaint  Patient presents with  . Loss of Consciousness     (Consider location/radiation/quality/duration/timing/severity/associated sxs/prior Treatment) Patient is a 23 y.o. female presenting with syncope.  Loss of Consciousness Episode history:  Single Most recent episode:  Today Chronicity:  Recurrent Context: normal activity   Context: not blood draw and not bowel movement   Witnessed: yes   Relieved by: time. Associated symptoms: no chest pain, no fever, no palpitations and no shortness of breath     Past Medical History  Diagnosis Date  . WPW (Wolff-Parkinson-White syndrome)     s/p ablation (2011)  . Syncope     recurrent  . Vasovagal syncope   . POTS (postural orthostatic tachycardia syndrome)   . Palpitations     daily  . Hx of echocardiogram 04/2012    normal  . History of Holter monitoring 06/2013    "NSR, sinus tach, sinus arrythmia, one PVC, one PAC"  . Tilt table evaluation 02/2011    dx neurocardiogenic syncope   Past Surgical History  Procedure Laterality Date  . Cardiac electrophysiology study and ablation  2011    [facility]   No family history on file. History  Substance Use Topics  . Smoking status: Never Smoker   . Smokeless tobacco: Not on file  . Alcohol Use: No   OB History    No data available     Review of Systems  Constitutional: Negative for fever and appetite change.  HENT: Negative for congestion and drooling.   Eyes: Negative for photophobia and pain.  Respiratory: Negative for cough and shortness of breath.   Cardiovascular: Positive for syncope. Negative for chest pain, palpitations and leg swelling.  Gastrointestinal: Negative for abdominal pain and constipation.  Endocrine: Negative for polydipsia and polyuria.  Musculoskeletal: Negative for neck stiffness.  Skin: Negative for  pallor and wound.  Neurological: Positive for syncope.       Pain in front of head and in neck, otherwise pain free      Allergies  Amitiza; Ketorolac tromethamine; and Metoclopramide  Home Medications   Prior to Admission medications   Medication Sig Start Date End Date Taking? Authorizing Provider  aspirin EC 81 MG tablet Take 81 mg by mouth daily.   Yes Historical Provider, MD  sodium chloride 1 G tablet Take 1 g by mouth 2 (two) times daily.    Yes Historical Provider, MD  traMADol (ULTRAM) 50 MG tablet Take 1 tablet (50 mg total) by mouth every 6 (six) hours as needed. Patient not taking: Reported on 03/12/2014 03/04/14   Gerhard Munchobert Lockwood, MD   BP 122/72 mmHg  Pulse 93  Temp(Src) 99.5 F (37.5 C) (Oral)  Resp 22  SpO2 100%  LMP 02/15/2014 Physical Exam  Constitutional: She is oriented to person, place, and time. She appears well-developed and well-nourished.  HENT:  Head: Normocephalic and atraumatic.  Eyes: Conjunctivae and EOM are normal. Right eye exhibits no discharge. Left eye exhibits no discharge.  Cardiovascular: Normal rate and regular rhythm.   Pulmonary/Chest: Effort normal and breath sounds normal. No respiratory distress.  Abdominal: Soft. She exhibits no distension. There is no tenderness. There is no rebound.  Musculoskeletal: Normal range of motion. She exhibits no edema or tenderness.  Neurological: She is alert and oriented to person, place, and time.  No altered  mental status, able to give full seemingly accurate history.  Face is symmetric, EOM's intact, pupils equal and reactive, vision intact, tongue and uvula midline without deviation Upper and Lower extremity motor 5/5, intact pain perception in distal extremities, 2+ reflexes in biceps, patella and achilles tendons. Finger to nose normal, heel to shin normal.   Skin: Skin is warm and dry.  Nursing note and vitals reviewed.   ED Course  Procedures (including critical care time) Labs Review Labs  Reviewed  CBC WITH DIFFERENTIAL - Abnormal; Notable for the following:    Hemoglobin 11.2 (*)    HCT 35.5 (*)    All other components within normal limits  COMPREHENSIVE METABOLIC PANEL  URINALYSIS, ROUTINE W REFLEX MICROSCOPIC  PREGNANCY, URINE  POCT CBG (FASTING - GLUCOSE)-MANUAL ENTRY  I-STAT TROPOININ, ED  CBG MONITORING, ED    Imaging Review Dg Chest 2 View  03/12/2014   CLINICAL DATA:  Loss of consciousness and fall  EXAM: CHEST  2 VIEW  COMPARISON:  03/04/2014  FINDINGS: Cardiomediastinal silhouette is stable. No acute infiltrate or pleural effusion. No pulmonary edema. Bony thorax is stable.  IMPRESSION: No active cardiopulmonary disease.   Electronically Signed   By: Natasha Mead M.D.   On: 03/12/2014 15:45   Ct Head Wo Contrast  03/12/2014   CLINICAL DATA:  Pt states she passed out today outside hitting the cement ground. Pt c/o frontal head pain and mid cervical pain with jaw pain  EXAM: CT HEAD WITHOUT CONTRAST  CT CERVICAL SPINE WITHOUT CONTRAST  TECHNIQUE: Multidetector CT imaging of the head and cervical spine was performed following the standard protocol without intravenous contrast. Multiplanar CT image reconstructions of the cervical spine were also generated.  COMPARISON:  Head CT 12/29/2013  FINDINGS: CT HEAD FINDINGS  No intracranial hemorrhage. No parenchymal contusion. No midline shift or mass effect. Basilar cisterns are patent. No skull base fracture. No fluid in the paranasal sinuses or mastoid air cells. Orbits are normal.  CT CERVICAL SPINE FINDINGS  No prevertebral soft tissue swelling. Normal alignment of cervical vertebral bodies. No loss of vertebral body height. Normal facet articulation. Normal craniocervical junction.  No evidence epidural or paraspinal hematoma.  IMPRESSION: 1. Normal head CT. 2. No cervical spine fracture.   Electronically Signed   By: Genevive Bi M.D.   On: 03/12/2014 18:11   Ct Cervical Spine Wo Contrast  03/12/2014   CLINICAL DATA:  Pt  states she passed out today outside hitting the cement ground. Pt c/o frontal head pain and mid cervical pain with jaw pain  EXAM: CT HEAD WITHOUT CONTRAST  CT CERVICAL SPINE WITHOUT CONTRAST  TECHNIQUE: Multidetector CT imaging of the head and cervical spine was performed following the standard protocol without intravenous contrast. Multiplanar CT image reconstructions of the cervical spine were also generated.  COMPARISON:  Head CT 12/29/2013  FINDINGS: CT HEAD FINDINGS  No intracranial hemorrhage. No parenchymal contusion. No midline shift or mass effect. Basilar cisterns are patent. No skull base fracture. No fluid in the paranasal sinuses or mastoid air cells. Orbits are normal.  CT CERVICAL SPINE FINDINGS  No prevertebral soft tissue swelling. Normal alignment of cervical vertebral bodies. No loss of vertebral body height. Normal facet articulation. Normal craniocervical junction.  No evidence epidural or paraspinal hematoma.  IMPRESSION: 1. Normal head CT. 2. No cervical spine fracture.   Electronically Signed   By: Genevive Bi M.D.   On: 03/12/2014 18:11     EKG Interpretation   Date/Time:  Wednesday March 12 2014 14:09:52 EST Ventricular Rate:  91 PR Interval:  124 QRS Duration: 77 QT Interval:  345 QTC Calculation: 424 R Axis:   76 Text Interpretation:  Sinus rhythm No significant change since last  tracing Confirmed by HARRISON  MD, FORREST ([id number]) on 03/12/2014 3:30:29 PM      MDM   Final diagnoses:  Fall  Syncope and collapse    23 year old female with a significant past medical history of Wolff-Parkinson-White status post ablation. Also history of pots. Sees Dr. Alfonso EllisBeaty and Durwin NoraWinston as her cardiologist. Has a loop recorder and has had multiple episodes of syncope without a defined cause. [facility] and other facilities. happened today where she lost consciousness and fell on her face. It was witnessed and EMS was called and on their  arrival patient was awake and only complaining of face and neck pain. Vital Signs stable in route. Vital signs stable, neuro exam normal here cardiac exam normal here. Patient still having had neck pain we will image those areas. The due basic screening workup to evaluate for other causes of syncope and none at present patient likely be discharged home. We'll also interrogate her loop recorder. Patient was encouraged to Dr. Alfonso EllisBeaty know of this most recent episode for his recommendations. Recorder without acute events. Patient with tachycardic episode in ED up to 160 but was responsive, asymptomatic. Loop recorder without any evidence of this either. Unsure of why it is not recording appropriately. patient watched in ED for another couple hours afterwards and remained symptom free. Will FU w/ Dr. Cristi LoronBeatty.     Marily MemosJason Ailish Prospero, MD 03/12/14 2356  Purvis SheffieldForrest Harrison, MD 03/13/14 [phone]

## 2014-03-12 NOTE — ED Notes (Signed)
Pt is AAOX4 upon arrival to ED.

## 2014-03-12 NOTE — ED Notes (Signed)
Per [facility], pt was at [place], witnessed fall standing in parking lot. Fell from standing hit cement. EMS arrival pt found facedown. Pt did have positive LOC. Pt in C collar at this time. Pt c/o neck pain and headache. No obvious injuries to face. VSS. CBG 105. Last BP 128/76. HR 99. 16 RR. 99% RA. 12 lead unremarkable.

## 2014-03-12 NOTE — ED Notes (Signed)
Per Marily MemosJason Mesner, MD, pt ok to have C collar be removed.

## 2014-03-12 NOTE — ED Notes (Signed)
Patient transported to CT 

## 2014-03-28 ENCOUNTER — Emergency Department: Payer: Self-pay | Admitting: Emergency Medicine

## 2014-03-28 LAB — CBC
HCT: 35.6 % (ref 35.0–47.0)
HGB: 11.6 g/dL — ABNORMAL LOW (ref 12.0–16.0)
MCH: 28.3 pg (ref 26.0–34.0)
MCHC: 32.4 g/dL (ref 32.0–36.0)
MCV: 87 fL (ref 80–100)
PLATELETS: 280 10*3/uL (ref 150–440)
RBC: 4.08 10*6/uL (ref 3.80–5.20)
RDW: 12.7 % (ref 11.5–14.5)
WBC: 7.2 10*3/uL (ref 3.6–11.0)

## 2014-03-28 LAB — PREGNANCY, URINE: Pregnancy Test, Urine: NEGATIVE m[IU]/mL

## 2014-03-28 LAB — URINALYSIS, COMPLETE
BILIRUBIN, UR: NEGATIVE
Bacteria: NONE SEEN
Blood: NEGATIVE
GLUCOSE, UR: NEGATIVE mg/dL (ref 0–75)
Ketone: NEGATIVE
Leukocyte Esterase: NEGATIVE
Nitrite: NEGATIVE
PH: 7 (ref 4.5–8.0)
PROTEIN: NEGATIVE
RBC,UR: 1 /HPF (ref 0–5)
SPECIFIC GRAVITY: 1.001 (ref 1.003–1.030)
Squamous Epithelial: <1
WBC UR: <1 /HPF (ref 0–5)

## 2014-03-28 LAB — BASIC METABOLIC PANEL
Anion Gap: 5 — ABNORMAL LOW (ref 7–16)
BUN: 13 mg/dL (ref 7–18)
CHLORIDE: 107 mmol/L (ref 98–107)
CO2: 29 mmol/L (ref 21–32)
Calcium, Total: 9.2 mg/dL (ref 8.5–10.1)
Creatinine: 0.94 mg/dL (ref 0.60–1.30)
EGFR (African American): >60
EGFR (Non-African Amer.): >60
GLUCOSE: 99 mg/dL (ref 65–99)
OSMOLALITY: 281 (ref 275–301)
Potassium: 3.9 mmol/L (ref 3.5–5.1)
SODIUM: 141 mmol/L (ref 136–145)

## 2014-04-02 ENCOUNTER — Emergency Department (HOSPITAL_COMMUNITY)
Admission: EM | Admit: 2014-04-02 | Discharge: 2014-04-02 | Disposition: A | Discharge status: Home / Self Care | Payer: BC Managed Care – PPO | Attending: Emergency Medicine | Admitting: Emergency Medicine

## 2014-04-02 ENCOUNTER — Encounter (HOSPITAL_COMMUNITY): Payer: Self-pay | Admitting: Emergency Medicine

## 2014-04-02 ENCOUNTER — Emergency Department (HOSPITAL_COMMUNITY): Payer: BC Managed Care – PPO

## 2014-04-02 DIAGNOSIS — Z79899 Other long term (current) drug therapy: Secondary | ICD-10-CM | POA: Insufficient documentation

## 2014-04-02 DIAGNOSIS — R0789 Other chest pain: Secondary | ICD-10-CM | POA: Diagnosis not present

## 2014-04-02 DIAGNOSIS — R531 Weakness: Secondary | ICD-10-CM | POA: Insufficient documentation

## 2014-04-02 DIAGNOSIS — Z7982 Long term (current) use of aspirin: Secondary | ICD-10-CM | POA: Insufficient documentation

## 2014-04-02 DIAGNOSIS — Z9889 Other specified postprocedural states: Secondary | ICD-10-CM | POA: Insufficient documentation

## 2014-04-02 DIAGNOSIS — Z3202 Encounter for pregnancy test, result negative: Secondary | ICD-10-CM | POA: Diagnosis not present

## 2014-04-02 DIAGNOSIS — R5383 Other fatigue: Secondary | ICD-10-CM | POA: Insufficient documentation

## 2014-04-02 DIAGNOSIS — Z8679 Personal history of other diseases of the circulatory system: Secondary | ICD-10-CM | POA: Diagnosis not present

## 2014-04-02 DIAGNOSIS — R55 Syncope and collapse: Secondary | ICD-10-CM | POA: Diagnosis not present

## 2014-04-02 LAB — CBC WITH DIFFERENTIAL/PLATELET
Basophils Absolute: 0 10*3/uL (ref 0.0–0.1)
Basophils Relative: 0 % (ref 0–1)
Eosinophils Absolute: 0.1 10*3/uL (ref 0.0–0.7)
Eosinophils Relative: 1 % (ref 0–5)
HEMATOCRIT: 36.6 % (ref 36.0–46.0)
HEMOGLOBIN: 11.4 g/dL — AB (ref 12.0–15.0)
Lymphocytes Relative: 40 % (ref 12–46)
Lymphs Abs: 2.7 10*3/uL (ref 0.7–4.0)
MCH: 27 pg (ref 26.0–34.0)
MCHC: 31.1 g/dL (ref 30.0–36.0)
MCV: 86.7 fL (ref 78.0–100.0)
MONOS PCT: 6 % (ref 3–12)
Monocytes Absolute: 0.4 10*3/uL (ref 0.1–1.0)
Neutro Abs: 3.5 10*3/uL (ref 1.7–7.7)
Neutrophils Relative %: 53 % (ref 43–77)
Platelets: 257 10*3/uL (ref 150–400)
RBC: 4.22 MIL/uL (ref 3.87–5.11)
RDW: 12.2 % (ref 11.5–15.5)
WBC: 6.7 10*3/uL (ref 4.0–10.5)

## 2014-04-02 LAB — COMPREHENSIVE METABOLIC PANEL
ALK PHOS: 56 U/L (ref 39–117)
ALT: 10 U/L (ref 0–35)
AST: 22 U/L (ref 0–37)
Albumin: 4.8 g/dL (ref 3.5–5.2)
Anion gap: 9 (ref 5–15)
BUN: 10 mg/dL (ref 6–23)
CHLORIDE: 103 meq/L (ref 96–112)
CO2: 25 mmol/L (ref 19–32)
Calcium: 9.8 mg/dL (ref 8.4–10.5)
Creatinine, Ser: 0.83 mg/dL (ref 0.50–1.10)
GFR calc Af Amer: >90 mL/min (ref >90)
GLUCOSE: 83 mg/dL (ref 70–99)
Potassium: 3.7 mmol/L (ref 3.5–5.1)
Sodium: 137 mmol/L (ref 135–145)
Total Bilirubin: 0.4 mg/dL (ref 0.3–1.2)
Total Protein: 7.6 g/dL (ref 6.0–8.3)

## 2014-04-02 LAB — URINALYSIS, ROUTINE W REFLEX MICROSCOPIC
Bilirubin Urine: NEGATIVE
Glucose, UA: NEGATIVE mg/dL
HGB URINE DIPSTICK: NEGATIVE
KETONES UR: NEGATIVE mg/dL
Leukocytes, UA: NEGATIVE
Nitrite: NEGATIVE
Protein, ur: NEGATIVE mg/dL
Specific Gravity, Urine: 1.011 (ref 1.005–1.030)
UROBILINOGEN UA: 0.2 mg/dL (ref 0.0–1.0)
pH: 7.5 (ref 5.0–8.0)

## 2014-04-02 LAB — TROPONIN I: Troponin I: <0.03 ng/mL (ref <0.031)

## 2014-04-02 LAB — PREGNANCY, URINE: Preg Test, Ur: NEGATIVE

## 2014-04-02 MED ORDER — SODIUM CHLORIDE 0.9 % IV BOLUS (SEPSIS)
1000.0000 mL | Freq: Once | INTRAVENOUS | Status: AC
  Administered 2014-04-02: 1000 mL via INTRAVENOUS

## 2014-04-02 NOTE — ED Notes (Signed)
Phlebotomy at bedside.

## 2014-04-02 NOTE — ED Notes (Signed)
Per EMS, pt from home had a witnessed syncopal episode lasting about 5 minutes with a 1-2 minute period of confusion post episode. Pt has hx of these episodes and has a loop recorder. Pt states she was unable to push the loop recorder button before the episode. Pt denies any dizzines or SOB. Pt denies hitting head. Pt states that since Christmas, she has been feeling her "heart racing" with intermittent episodes of sharp central CP. NAD noted. VSS.

## 2014-04-02 NOTE — Discharge Instructions (Signed)
Read the information below.  You may return to the Emergency Department at any time for worsening condition or any new symptoms that concern you.   Use the [employer] insurance card - phone numbers or website to find local providers for primary care follow up.    Syncope Syncope is a medical term for fainting or passing out. This means you lose consciousness and drop to the ground. People are generally unconscious for less than 5 minutes. You may have some muscle twitches for up to 15 seconds before waking up and returning to normal. Syncope occurs more often in older adults, but it can happen to anyone. While most causes of syncope are not dangerous, syncope can be a sign of a serious medical problem. It is important to seek medical care.  CAUSES  Syncope is caused by a sudden drop in blood flow to the brain. The specific cause is often not determined. Factors that can bring on syncope include:  Taking medicines that lower blood pressure.  Sudden changes in posture, such as standing up quickly.  Taking more medicine than prescribed.  Standing in one place for too long.  Seizure disorders.  Dehydration and excessive exposure to heat.  Low blood sugar (hypoglycemia).  Straining to have a bowel movement.  Heart disease, irregular heartbeat, or other circulatory problems.  Fear, emotional distress, seeing blood, or severe pain. SYMPTOMS  Right before fainting, you may:  Feel dizzy or light-headed.  Feel nauseous.  See all white or all black in your field of vision.  Have cold, clammy skin. DIAGNOSIS  Your health care provider will ask about your symptoms, perform a physical exam, and perform an electrocardiogram (ECG) to record the electrical activity of your heart. Your health care provider may also perform other heart or blood tests to determine the cause of your syncope which may include:  Transthoracic echocardiogram (TTE). During echocardiography, sound waves are used to evaluate  how blood flows through your heart.  Transesophageal echocardiogram (TEE).  Cardiac monitoring. This allows your health care provider to monitor your heart rate and rhythm in real time.  Holter monitor. This is a portable device that records your heartbeat and can help diagnose heart arrhythmias. It allows your health care provider to track your heart activity for several days, if needed.  Stress tests by exercise or by giving medicine that makes the heart beat faster. TREATMENT  In most cases, no treatment is needed. Depending on the cause of your syncope, your health care provider may recommend changing or stopping some of your medicines. HOME CARE INSTRUCTIONS  Have someone stay with you until you feel stable.  Do not drive, use machinery, or play sports until your health care provider says it is okay.  Keep all follow-up appointments as directed by your health care provider.  Lie down right away if you start feeling like you might faint. Breathe deeply and steadily. Wait until all the symptoms have passed.  Drink enough fluids to keep your urine clear or pale yellow.  If you are taking blood pressure or heart medicine, get up slowly and take several minutes to sit and then stand. This can reduce dizziness. SEEK IMMEDIATE MEDICAL CARE IF:   You have a severe headache.  You have unusual pain in the chest, abdomen, or back.  You are bleeding from your mouth or rectum, or you have black or tarry stool.  You have an irregular or very fast heartbeat.  You have pain with breathing.  You have  repeated fainting or seizure-like jerking during an episode.  You faint when sitting or lying down.  You have confusion.  You have trouble walking.  You have severe weakness.  You have vision problems. If you fainted, call your local emergency services (911 in U.S.). Do not drive yourself to the hospital.  MAKE SURE YOU:  Understand these instructions.  Will watch your  condition.  Will get help right away if you are not doing well or get worse. Document Released: 03/21/2005 Document Revised: 03/26/2013 Document Reviewed: 05/20/2011 [facility] Patient Information 2015 [place], [place]. This information is not intended to replace advice given to you by your health care provider. Make sure you discuss any questions you have with your health care provider.   Emergency Department Resource Guide 1) Find a Doctor and Pay Out of Pocket Although you won't have to find out who is covered by your insurance plan, it is a good idea to ask around and get recommendations. You will then need to call the office and see if the doctor you have chosen will accept you as a new patient and what types of options they offer for patients who are self-pay. Some doctors offer discounts or will set up payment plans for their patients who do not have insurance, but you will need to ask so you aren't surprised when you get to your appointment.  2) Contact Your Local Health Department Not all health departments have doctors that can see patients for sick visits, but many do, so it is worth a call to see if yours does. If you don't know where your local health department is, you can check in your phone book. The CDC also has a tool to help you locate your state's health department, and many state websites also have listings of all of their local health departments.  3) Find a Walk-in Clinic If your illness is not likely to be very severe or complicated, you may want to try a walk in clinic. These are popping up all over the country in pharmacies, drugstores, and shopping centers. They're usually staffed by nurse practitioners or physician assistants that have been trained to treat common illnesses and complaints. They're usually fairly quick and inexpensive. However, if you have serious medical issues or chronic medical problems, these are probably not your best option.  No Primary Care  Doctor: - Call Health Connect at  [phone] - they can help you locate a primary care doctor that  accepts your insurance, provides certain services, etc. - Physician Referral Service- [phone]  Chronic Pain Problems: Organization         Address  Phone   Notes  [facility]  [phone] Patients need to be referred by their primary care doctor.   Medication Assistance: Organization         Address  Phone   Notes  [facility] Medication [facility] [address] [place], [place] [address] [phone] --Must be a resident of [facility] -- Must have NO insurance coverage whatsoever (no Medicaid/ Medicare, etc.) -- The pt. MUST have a primary care doctor that directs their care regularly and follows them in the community   [facility]ist  [phone]   [employer]  [phone]    Agencies that provide inexpensive medical care: Organization         Address  Phone   Notes  [facility]  [phone]   [facility]    (  [phone]   [facility] [place], [place] [address] [phone]   [place] [address], [place] [phone]   Planned Parenthood    [phone]   [facility]    [phone]   [place] [address], [place] Phone:  [phone], Fax:  [phone] Hours of Operation:  9 am - 6 pm, M-F.  Also accepts Medicaid/Medicare and self-pay.  [facility] for [place], [address], [place] Phone: [phone], Fax: [phone]. Hours of Operation:  8:30 am - 5:30 pm, M-F.  Also accepts Medicaid and self-pay.  [facility] [place] [address], [place] Phone: [phone]   [place], [place], [place] [phone], Ext. [phone] Mondays & Thursdays: 7-9 AM.  First 15 patients are seen on a first  come, first serve basis.    [place] Providers:  Organization         Address  Phone   Notes  [facility] [address], [place] [phone] Also accepts self-pay patients.  [facility] [address], [place]  [phone]   [place], [address], [place] [phone]   [facility] [address], [place] [phone]   Lucianne Lei [address], [place]   [phone] Only accepts [place] Access [place] patients after they have their name applied to their card.   Self-Pay (no insurance) in [facility]:  Organization         Address  Phone   Notes  Sickle Cell Patients, [facility] Internal Medicine [place] [phone]   [facility] [place] [phone]   [facility] [place]  [place], [place], [place] [phone]   [facility]/Dr. Osei-Bonsu  [address] [place] or [address], [place] [phone] Phone number for both [place] and [place] locations is the same.  Urgent Medical and [facility] [address], [place] [phone]   [facility] [address], [place] or [address] [phone]   [facility] [address], [place] [phone], phone; [phone], fax Sees patients 1st and 3rd Saturday of every month.  Must not qualify for public or private insurance (i.e. Medicaid, Medicare, [place] Health Choice, Veterans' Benefits)  Household income should be no more than 200% of the poverty level The clinic cannot treat you if you are pregnant or think you are pregnant  Sexually transmitted diseases are not treated at the clinic.    Dental Care: Organization          Address  Phone  Notes  [facility] Department of [facility] [phone] Accepts children up to age 48 who are enrolled in [place] or [place]; pregnant women with a Medicaid card; and children who have applied for Medicaid or [place] Health Choice, but were declined, whose parents can pay a reduced fee at time of service.  [facility] Department of [facility]  [address], [place] [phone] Accepts children up  to age 63 who are enrolled in Medicaid or [place] Health Choice; pregnant women with a Medicaid card; and children who have applied for Medicaid or  Health Choice, but were declined, whose parents can pay a reduced fee at time of service.  [facility]  [place] [phone] Patients are seen by appointment only. Walk-ins are not accepted. [place] will see patients 68 years of age and older. Monday - Tuesday (8am-5pm) Most Wednesdays (8:30-5pm) $30 per visit, cash only  [facility]  [address], [facility] [phone] Patients are seen by appointment only. Walk-ins are not accepted. [place] will see patients 58 years of age and older. One Wednesday Evening (Monthly: Volunteer Based).  $30 per visit, cash only  [place]  [phone] for adults; Children under age 51, call Graduate Pediatric Dentistry at [phone]. Children aged 32-14, please call [phone] to request a pediatric application.  Dental services are provided in all areas of dental care including fillings, crowns and bridges, complete and partial dentures, implants, gum treatment, root canals, and extractions. Preventive care is also provided. Treatment is provided to both adults and children. Patients are selected via a lottery and there is often a waiting list.   [facility] [address] [place]  [phone] [email or web address]   [facility] [address] [place], [place] [phone], Ext. [phone] Second and Fourth Thursday of each month, opens at 6:30 AM; Clinic ends at 9 AM.  Patients are seen on a first-come first-served basis, and a limited number are seen during each clinic.   [facility]  [address] Hillard Danker Ferrer [place], [place] [phone]   Eligibility Requirements You must have lived in [place], [place], or [place] counties for at least the last three months.   You cannot be eligible for state or federal sponsored [employer], including [employer], [place], or [employer].   You generally cannot be eligible for healthcare insurance through your employer.    How to apply: Eligibility screenings are held every Tuesday and Wednesday afternoon from 1:00 pm until 4:00 pm. You do not need an appointment for the interview!  [facility] [address] [place], [place]   [place]  [place] Department  [place]  [phone]    Behavioral Health Resources in the Community: Intensive Outpatient Programs Organization         Address  Phone  Notes  [place]. [address] [place], [place] [phone]   [facility] Outpatient [address] [place], [place]   ADS: Alcohol & Drug Svcs [address], [place], [place]   [place] [address],  [place], [place] or [phone]   Substance Abuse Resources Organization         Address  Phone  Notes  Alcohol and Drug Services  [phone]   [place]  [phone]   The [place]   Chinita Pester  [phone]   Residential & Outpatient Substance Abuse Program  [phone]   Psychological  Services Organization         Address  Phone  Notes  [place]  [place]  [place]   [place]  [address], [place] [phone] or [phone]    Mobile Crisis Teams Organization         Address  Phone  Notes  [facility], Mobile Crisis Care Unit  [phone]   [facility]  [address] [place], [place] [phone]   Doristine LocksSharon DeEsch [address] [place] [phone]    Self-Help/Support Groups Organization         Address  Phone             Notes  Mental Health Assoc. of [place] - variety of support groups  336- [phone] Call for more information  Narcotics Anonymous (NA), [facility]aring [facility] [address], [place]  2 meetings at this location   Statisticianesidential Treatment Programs Organization         Address  Phone  Notes  [facility] [address],    [place]  [phone]   [facility]  [address], [place] [phone], [place], [place] [phone]   [facility] [address], [place] [phone] Admissions: 8am-3pm M-F  [facility] [address]    [place], [place] [phone]   [facility] [address], [place], [place] [phone]   [facility] [address]  [place], [place] [phone]   [facility] - Intensive Outpatient [address], [place], [place] [phone]   [facility] ([facility] [address]  [place], [place] [phone] or [phone]   [facility] Services (RTS) [address] [place], [place] [phone] Accepts Medicaid  [facility] [address]  [place] [phone] Substance Abuse/Addiction Treatment   [facility] Behavioral Health Resources Organization         Address  Phone  Notes  [facility]  [phone](888)  [phone]   Angie FavaJulie Brannon, PhD [address], [address] [place], [place]   [phone](336) [phone] or [phone]) [phone]   [facility]   [address] [place], [place] [phone]) [phone]   [facility] [address], [place], [place] [phone](336) [phone] Insurance/Medicaid/sponsorship through [facility]  Faith and Families [address]                                    [place], [place] [phone](336) [phone] Therapy/tele-psych/case  [facility] [address]   [place], [place] [phone]) [phone]    Dr. Lolly MustacheArfeen  [phone]) [phone]   [facility] [place]  [employer] [facility] 1) [address], [place] 2) [address], [place] 3)  [address], [place] [phone](336) [phone](336) [phone]  [phone]) [phone]   [facility] Child Abuse Hotline [phone](336) 9471723108 or [phone]) [phone] (After Hours)

## 2014-04-02 NOTE — ED Provider Notes (Signed)
CSN: [id number]     Arrival date & time 04/02/14  1613 History   First MD Initiated Contact with Patient 04/02/14 1628     Chief Complaint  Patient presents with  . Loss of Consciousness     (Consider location/radiation/quality/duration/timing/severity/associated sxs/prior Treatment) HPI   Pt with hx WPW s/p ablation 2011 and POTS p/w episode of syncope.  [facility] and outside hospitals for same, is currently wearing LOOP recorder, followed [facility]Beaty [facility].  Has had negative tilt table and holter tests.   Today patient was standing up, talking, felt her heart start to race and before she could say or do anything she lost consciousness.  The next thing she remembers is EMS being there and being taken out to the ambulance.  Currently her only symptoms is generalized fatigue, weakness, which she states is typical after her syncopal episodes.  Over the past few weeks the palpitations are becoming more frequent, now once every other day, lasting a few minutes, occuring randomly ,defined as heart racing. It occurs at any time of day and sometimes in her sleep.  When it happens at night she starts sweating.  She has also had intermittent sharp chest pain.  The pain occurs in the center of her chest, radiates to the left shoulder and arm, lasts for seconds.  Last episode was this morning.  Denies fevers, N/V/D     Past Medical History  Diagnosis Date  . WPW (Wolff-Parkinson-White syndrome)     s/p ablation (2011)  . Syncope     recurrent  . Vasovagal syncope   . POTS (postural orthostatic tachycardia syndrome)   . Palpitations     daily  . Hx of echocardiogram 04/2012    normal  . History of Holter monitoring 06/2013    "NSR, sinus tach, sinus arrythmia, one PVC, one PAC"  . Tilt table evaluation 02/2011    dx neurocardiogenic syncope   Past Surgical History  Procedure Laterality Date  . Cardiac electrophysiology study and ablation   2011    [facility]n [facility]   No family history on file. History  Substance Use Topics  . Smoking status: Never Smoker   . Smokeless tobacco: Not on file  . Alcohol Use: No   OB History    No data available     Review of Systems  All other systems reviewed and are negative.     Allergies  Amitiza; Ketorolac tromethamine; and Metoclopramide  Home Medications   Prior to Admission medications   Medication Sig Start Date End Date Taking? Authorizing Provider  aspirin EC 81 MG tablet Take 81 mg by mouth daily.    Historical Provider, MD  sodium chloride 1 G tablet Take 1 g by mouth 2 (two) times daily.     Historical Provider, MD  traMADol (ULTRAM) 50 MG tablet Take 1 tablet (50 mg total) by mouth every 6 (six) hours as needed. Patient not taking: Reported on 03/12/2014 03/04/14   Gerhard Munchobert Lockwood, MD   BP 114/73 mmHg  Pulse 94  Temp(Src) 98.4 F (36.9 C) (Oral)  Resp 21  SpO2 100%  LMP 02/15/2014 Physical Exam  Constitutional: She appears well-developed and well-nourished. No distress.  HENT:  Head: Normocephalic and atraumatic.  Eyes: Conjunctivae are normal.  Neck: Normal range of motion. Neck supple.  Cardiovascular: Normal rate and regular rhythm.   Pulmonary/Chest: Effort normal and breath sounds normal. No respiratory distress. She has no  wheezes. She has no rales.  Abdominal: Soft. She exhibits no distension. There is no tenderness. There is no rebound and no guarding.  Musculoskeletal: She exhibits no edema.  Spine nontender, no crepitus, or stepoffs.   Neurological: She is alert.  Skin: She is not diaphoretic.  Nursing note and vitals reviewed.   ED Course  Procedures (including critical care time) Labs Review Labs Reviewed  CBC WITH DIFFERENTIAL - Abnormal; Notable for the following:    Hemoglobin 11.4 (*)    All other components within normal limits  COMPREHENSIVE METABOLIC PANEL  URINALYSIS, ROUTINE W REFLEX MICROSCOPIC  PREGNANCY, URINE   TROPONIN I    Imaging Review Dg Chest 2 View  04/02/2014   CLINICAL DATA:  Dizziness, increased heart rate  EXAM: CHEST  2 VIEW  COMPARISON:  03/12/2014  FINDINGS: The heart size and mediastinal contours are within normal limits. Both lungs are clear. The visualized skeletal structures are unremarkable.  IMPRESSION: No active cardiopulmonary disease.   Electronically Signed   By: Elige KoHetal  Patel   On: 04/02/2014 17:53     EKG Interpretation None        Date: 04/02/2014  Rate: 91  Rhythm: normal sinus rhythm  QRS Axis: normal  Intervals: normal  ST/T Wave abnormalities: normal  Conduction Disutrbances:none  Narrative Interpretation: LVH  Old EKG Reviewed: none available    5:29 PM [employer] called, there were no episodes today on loop recorder.  Average daily heart rate has been around 100bmp.    Wells' PE score is zero.  HEART score is zero.   8:05 PM Discussed pt with Dr Madilyn Hookees.  Plan is for d/c home with cardiology follow up.    MDM   Final diagnoses:  Syncope, unspecified syncope type    Afebrile, nontoxic patient with hx WPW s/p ablation and POTS p/w syncope.  Pt has had multiple workups with same with negative TILT tablet test and negative holter monitor.  Currently has LOOP recorder with no abnormal event today.  She reports atypical chest pain.  Doubt ACS.  Doubt PE.  Asymptomatic while in ED.  No events on monitor.  Labs, UA, CXR, EKG unremarkable. Not pregnant.  D/C home with cardiology and PCP follow up.  Pt has health insurance, encouraged patient to find and follow up with a primary care provider.   Discussed result, findings, treatment, and follow up  with patient.  Pt given return precautions.  Pt verbalizes understanding and agrees with plan.         Desiree Dredgemily Kaileigh Viswanathan, PA-C 04/02/14 2020  Tilden FossaElizabeth Rees, MD 04/02/14 2230

## 2014-04-09 ENCOUNTER — Emergency Department (HOSPITAL_COMMUNITY)
Admission: EM | Admit: 2014-04-09 | Discharge: 2014-04-09 | Disposition: A | Discharge status: Home / Self Care | Payer: BLUE CROSS/BLUE SHIELD | Attending: Emergency Medicine | Admitting: Emergency Medicine

## 2014-04-09 ENCOUNTER — Encounter (HOSPITAL_COMMUNITY): Payer: Self-pay | Admitting: Emergency Medicine

## 2014-04-09 DIAGNOSIS — R002 Palpitations: Secondary | ICD-10-CM | POA: Diagnosis not present

## 2014-04-09 DIAGNOSIS — R5383 Other fatigue: Secondary | ICD-10-CM | POA: Insufficient documentation

## 2014-04-09 DIAGNOSIS — Z7982 Long term (current) use of aspirin: Secondary | ICD-10-CM | POA: Insufficient documentation

## 2014-04-09 DIAGNOSIS — Z8679 Personal history of other diseases of the circulatory system: Secondary | ICD-10-CM | POA: Diagnosis not present

## 2014-04-09 DIAGNOSIS — Z3202 Encounter for pregnancy test, result negative: Secondary | ICD-10-CM | POA: Diagnosis not present

## 2014-04-09 DIAGNOSIS — Z79899 Other long term (current) drug therapy: Secondary | ICD-10-CM | POA: Insufficient documentation

## 2014-04-09 DIAGNOSIS — R000 Tachycardia, unspecified: Secondary | ICD-10-CM | POA: Diagnosis not present

## 2014-04-09 DIAGNOSIS — R55 Syncope and collapse: Secondary | ICD-10-CM | POA: Diagnosis not present

## 2014-04-09 LAB — URINALYSIS, ROUTINE W REFLEX MICROSCOPIC
Bilirubin Urine: NEGATIVE
Glucose, UA: NEGATIVE mg/dL
HGB URINE DIPSTICK: NEGATIVE
KETONES UR: NEGATIVE mg/dL
Leukocytes, UA: NEGATIVE
NITRITE: NEGATIVE
Protein, ur: NEGATIVE mg/dL
Specific Gravity, Urine: 1.007 (ref 1.005–1.030)
Urobilinogen, UA: 0.2 mg/dL (ref 0.0–1.0)
pH: 6.5 (ref 5.0–8.0)

## 2014-04-09 LAB — CBC WITH DIFFERENTIAL/PLATELET
Basophils Absolute: 0 10*3/uL (ref 0.0–0.1)
Basophils Relative: 0 % (ref 0–1)
EOS PCT: 0 % (ref 0–5)
Eosinophils Absolute: 0 10*3/uL (ref 0.0–0.7)
HCT: 36.3 % (ref 36.0–46.0)
Hemoglobin: 11.4 g/dL — ABNORMAL LOW (ref 12.0–15.0)
Lymphocytes Relative: 35 % (ref 12–46)
Lymphs Abs: 1.9 10*3/uL (ref 0.7–4.0)
MCH: 26.9 pg (ref 26.0–34.0)
MCHC: 31.4 g/dL (ref 30.0–36.0)
MCV: 85.6 fL (ref 78.0–100.0)
MONO ABS: 0.3 10*3/uL (ref 0.1–1.0)
MONOS PCT: 5 % (ref 3–12)
NEUTROS ABS: 3.3 10*3/uL (ref 1.7–7.7)
NEUTROS PCT: 60 % (ref 43–77)
PLATELETS: 226 10*3/uL (ref 150–400)
RBC: 4.24 MIL/uL (ref 3.87–5.11)
RDW: 12.1 % (ref 11.5–15.5)
WBC: 5.6 10*3/uL (ref 4.0–10.5)

## 2014-04-09 LAB — TROPONIN I: Troponin I: <0.03 ng/mL (ref <0.031)

## 2014-04-09 LAB — BASIC METABOLIC PANEL
Anion gap: 8 (ref 5–15)
BUN: 9 mg/dL (ref 6–23)
CO2: 26 mmol/L (ref 19–32)
Calcium: 9.8 mg/dL (ref 8.4–10.5)
Chloride: 105 mEq/L (ref 96–112)
Creatinine, Ser: 0.86 mg/dL (ref 0.50–1.10)
GFR calc Af Amer: >90 mL/min (ref >90)
GFR calc non Af Amer: >90 mL/min (ref >90)
Glucose, Bld: 82 mg/dL (ref 70–99)
Potassium: 4.1 mmol/L (ref 3.5–5.1)
Sodium: 139 mmol/L (ref 135–145)

## 2014-04-09 LAB — CBG MONITORING, ED: Glucose-Capillary: 81 mg/dL (ref 70–99)

## 2014-04-09 LAB — POC URINE PREG, ED: Preg Test, Ur: NEGATIVE

## 2014-04-09 MED ORDER — SODIUM CHLORIDE 0.9 % IV BOLUS (SEPSIS)
1000.0000 mL | Freq: Once | INTRAVENOUS | Status: AC
  Administered 2014-04-09: 1000 mL via INTRAVENOUS

## 2014-04-09 NOTE — ED Notes (Signed)
Per [employer], no recorded episodes since last check on 04/02/14. Notified Dr Blinda LeatherwoodPollina.

## 2014-04-09 NOTE — ED Notes (Signed)
I gave the patient 2 packs of graham crackers and a cup of ice.

## 2014-04-09 NOTE — ED Provider Notes (Signed)
CSN: [id number]     Arrival date & time 04/09/14  1345 History   First MD Initiated Contact with Patient 04/09/14 1346     Chief Complaint  Patient presents with  . Loss of Consciousness     (Consider location/radiation/quality/duration/timing/severity/associated sxs/prior Treatment) HPI Comments: Patient presents to the ER for evaluation of syncope. Patient has a history of recurrent syncope. Patient reports that she had onset of a pounding in her chest, then started to feel weak and dizzy. She was caught by bystanders and laid to the ground, no fall. She did lose consciousness for a couple of minutes. This is similar to multiple episodes she has had previously. Patient reports that she has a history of WPW, status post ablation. Working diagnosis for her recurrent syncope is POTS versus vasovagal syncope versus recurrent WPW. Patient currently has a loop recorder. She is followed by Dr. Cristi Loron at [facility].  Patient is a 24 y.o. female presenting with syncope.  Loss of Consciousness Associated symptoms: palpitations     Past Medical History  Diagnosis Date  . WPW (Wolff-Parkinson-White syndrome)     s/p ablation (2011)  . Syncope     recurrent  . Vasovagal syncope   . POTS (postural orthostatic tachycardia syndrome)   . Palpitations     daily  . Hx of echocardiogram 04/2012    normal  . History of Holter monitoring 06/2013    "NSR, sinus tach, sinus arrythmia, one PVC, one PAC"  . Tilt table evaluation 02/2011    dx neurocardiogenic syncope   Past Surgical History  Procedure Laterality Date  . Cardiac electrophysiology study and ablation  2011    Dr. Gevena Cotton at [facility]   History reviewed. No pertinent family history. History  Substance Use Topics  . Smoking status: Never Smoker   . Smokeless tobacco: Not on file  . Alcohol Use: No   OB History    No data available     Review of Systems  Constitutional: Positive for fatigue.  Cardiovascular: Positive for  palpitations and syncope.  Neurological: Positive for syncope.  All other systems reviewed and are negative.     Allergies  Amitiza; Ketorolac tromethamine; and Metoclopramide  Home Medications   Prior to Admission medications   Medication Sig Start Date End Date Taking? Authorizing Provider  aspirin EC 81 MG tablet Take 81 mg by mouth daily.   Yes Historical Provider, MD  sodium chloride 1 G tablet Take 1 g by mouth 2 (two) times daily.    Yes Historical Provider, MD  traMADol (ULTRAM) 50 MG tablet Take 1 tablet (50 mg total) by mouth every 6 (six) hours as needed. Patient not taking: Reported on 03/12/2014 03/04/14   Gerhard Munch, MD   BP 119/73 mmHg  Pulse 45  Temp(Src) 98.2 F (36.8 C) (Oral)  Resp 20  SpO2 96%  LMP 03/05/2014 Physical Exam  Constitutional: She is oriented to person, place, and time. She appears well-developed and well-nourished. No distress.  HENT:  Head: Normocephalic and atraumatic.  Right Ear: Hearing normal.  Left Ear: Hearing normal.  Nose: Nose normal.  Mouth/Throat: Oropharynx is clear and moist and mucous membranes are normal.  Eyes: Conjunctivae and EOM are normal. Pupils are equal, round, and reactive to light.  Neck: Normal range of motion. Neck supple.  Cardiovascular: Regular rhythm, S1 normal and S2 normal.  Exam reveals no gallop and no friction rub.   No murmur heard. Pulmonary/Chest: Effort normal and breath sounds normal. No respiratory distress.  She exhibits no tenderness.  Abdominal: Soft. Normal appearance and bowel sounds are normal. There is no hepatosplenomegaly. There is no tenderness. There is no rebound, no guarding, no tenderness at McBurney's point and negative Murphy's sign. No hernia.  Musculoskeletal: Normal range of motion.  Neurological: She is alert and oriented to person, place, and time. She has normal strength. No cranial nerve deficit or sensory deficit. Coordination normal. GCS eye subscore is 4. GCS verbal  subscore is 5. GCS motor subscore is 6.  Skin: Skin is warm, dry and intact. No rash noted. No cyanosis.  Psychiatric: She has a normal mood and affect. Her speech is normal and behavior is normal. Thought content normal.  Nursing note and vitals reviewed.   ED Course  Procedures (including critical care time) Labs Review Labs Reviewed  CBC WITH DIFFERENTIAL - Abnormal; Notable for the following:    Hemoglobin 11.4 (*)    All other components within normal limits  BASIC METABOLIC PANEL  URINALYSIS, ROUTINE W REFLEX MICROSCOPIC  TROPONIN I  CBG MONITORING, ED  POC URINE PREG, ED    Imaging Review No results found.   EKG Interpretation   Date/Time:  Wednesday April 09 2014 13:48:05 EST Ventricular Rate:  99 PR Interval:  121 QRS Duration: 78 QT Interval:  336 QTC Calculation: 431 R Axis:   68 Text Interpretation:  Sinus rhythm Normal ECG Confirmed by POLLINA  MD,  CHRISTOPHER [id number]) on 04/09/2014 1:52:57 PM      MDM   Final diagnoses:  Syncope, unspecified syncope type    Patient with a known history of POTS presents after syncope. She does have a loop recorder. [employer] was consultative and there has not been any arrhythmia noted since the last interrogation. Patient's workup has been unremarkable. Her vital signs have been essentially normal except for intermittent episodes of mild tachycardia to 110 bpm. This seems to be consistent with the patient's normal heart rate, looking at the patient's loop recorder data. She has not had any other arrhythmia here in the ER. Patient will be discharged, [facility].    Gilda Creasehristopher J. Pollina, MD 04/10/14 [phone]

## 2014-04-09 NOTE — Discharge Instructions (Signed)
Syncope °Syncope is a medical term for fainting or passing out. This means you lose consciousness and drop to the ground. People are generally unconscious for less than 5 minutes. You may have some muscle twitches for up to 15 seconds before waking up and returning to normal. Syncope occurs more often in older adults, but it can happen to anyone. While most causes of syncope are not dangerous, syncope can be a sign of a serious medical problem. It is important to seek medical care.  °CAUSES  °Syncope is caused by a sudden drop in blood flow to the brain. The specific cause is often not determined. Factors that can bring on syncope include: °· Taking medicines that lower blood pressure. °· Sudden changes in posture, such as standing up quickly. °· Taking more medicine than prescribed. °· Standing in one place for too long. °· Seizure disorders. °· Dehydration and excessive exposure to heat. °· Low blood sugar (hypoglycemia). °· Straining to have a bowel movement. °· Heart disease, irregular heartbeat, or other circulatory problems. °· Fear, emotional distress, seeing blood, or severe pain. °SYMPTOMS  °Right before fainting, you may: °· Feel dizzy or light-headed. °· Feel nauseous. °· See all white or all black in your field of vision. °· Have cold, clammy skin. °DIAGNOSIS  °Your health care provider will ask about your symptoms, perform a physical exam, and perform an electrocardiogram (ECG) to record the electrical activity of your heart. Your health care provider may also perform other heart or blood tests to determine the cause of your syncope which may include: °· Transthoracic echocardiogram (TTE). During echocardiography, sound waves are used to evaluate how blood flows through your heart. °· Transesophageal echocardiogram (TEE). °· Cardiac monitoring. This allows your health care provider to monitor your heart rate and rhythm in real time. °· Holter monitor. This is a portable device that records your  heartbeat and can help diagnose heart arrhythmias. It allows your health care provider to track your heart activity for several days, if needed. °· Stress tests by exercise or by giving medicine that makes the heart beat faster. °TREATMENT  °In most cases, no treatment is needed. Depending on the cause of your syncope, your health care provider may recommend changing or stopping some of your medicines. °HOME CARE INSTRUCTIONS °· Have someone stay with you until you feel stable. °· Do not drive, use machinery, or play sports until your health care provider says it is okay. °· Keep all follow-up appointments as directed by your health care provider. °· Lie down right away if you start feeling like you might faint. Breathe deeply and steadily. Wait until all the symptoms have passed. °· Drink enough fluids to keep your urine clear or pale yellow. °· If you are taking blood pressure or heart medicine, get up slowly and take several minutes to sit and then stand. This can reduce dizziness. °SEEK IMMEDIATE MEDICAL CARE IF:  °· You have a severe headache. °· You have unusual pain in the chest, abdomen, or back. °· You are bleeding from your mouth or rectum, or you have black or tarry stool. °· You have an irregular or very fast heartbeat. °· You have pain with breathing. °· You have repeated fainting or seizure-like jerking during an episode. °· You faint when sitting or lying down. °· You have confusion. °· You have trouble walking. °· You have severe weakness. °· You have vision problems. °If you fainted, call your local emergency services (911 in [place] Do not drive   yourself to the hospital.  °MAKE SURE YOU: °· Understand these instructions. °· Will watch your condition. °· Will get help right away if you are not doing well or get worse. °Document Released: 03/21/2005 Document Revised: 03/26/2013 Document Reviewed: 05/20/2011 °ExitCare® Patient Information ©2015 [employer]. This information is not intended to replace  advice given to you by your health care provider. Make sure you discuss any questions you have with your health care provider. ° °

## 2014-04-09 NOTE — ED Notes (Signed)
CBG is 81. Notified nurse Marylene LandAngela.

## 2014-04-09 NOTE — ED Notes (Signed)
Pt via [facility] with c/o syncopal episode lasting "a few moments" as per witnesses who helped to the ground.  Pt has hx of Wolfe-Parkinson's White syndrome with ablation surgery in 2011.  Currently cardiologist is unsure if it has returned s/p ablation or has developed POTS.  Pt in NAD, A&O.

## 2014-05-14 ENCOUNTER — Encounter (HOSPITAL_COMMUNITY): Payer: Self-pay | Admitting: *Deleted

## 2014-05-14 ENCOUNTER — Emergency Department (HOSPITAL_COMMUNITY)
Admission: EM | Admit: 2014-05-14 | Discharge: 2014-05-14 | Disposition: A | Discharge status: Home / Self Care | Payer: BLUE CROSS/BLUE SHIELD | Attending: Emergency Medicine | Admitting: Emergency Medicine

## 2014-05-14 DIAGNOSIS — Z9889 Other specified postprocedural states: Secondary | ICD-10-CM | POA: Insufficient documentation

## 2014-05-14 DIAGNOSIS — R55 Syncope and collapse: Secondary | ICD-10-CM | POA: Diagnosis not present

## 2014-05-14 DIAGNOSIS — Z7982 Long term (current) use of aspirin: Secondary | ICD-10-CM | POA: Diagnosis not present

## 2014-05-14 DIAGNOSIS — I456 Pre-excitation syndrome: Secondary | ICD-10-CM | POA: Diagnosis not present

## 2014-05-14 DIAGNOSIS — Z79899 Other long term (current) drug therapy: Secondary | ICD-10-CM | POA: Insufficient documentation

## 2014-05-14 LAB — I-STAT CHEM 8, ED
BUN: 10 mg/dL (ref 6–23)
CHLORIDE: 102 mmol/L (ref 96–112)
Calcium, Ion: 1.19 mmol/L (ref 1.12–1.23)
Creatinine, Ser: 0.8 mg/dL (ref 0.50–1.10)
Glucose, Bld: 81 mg/dL (ref 70–99)
HCT: 35 % — ABNORMAL LOW (ref 36.0–46.0)
Hemoglobin: 11.9 g/dL — ABNORMAL LOW (ref 12.0–15.0)
POTASSIUM: 4.2 mmol/L (ref 3.5–5.1)
Sodium: 139 mmol/L (ref 135–145)
TCO2: 22 mmol/L (ref 0–100)

## 2014-05-14 LAB — I-STAT BETA HCG BLOOD, ED (MC, WL, AP ONLY)

## 2014-05-14 MED ORDER — SODIUM CHLORIDE 0.9 % IV BOLUS (SEPSIS)
1000.0000 mL | Freq: Once | INTRAVENOUS | Status: AC
  Administered 2014-05-14: 1000 mL via INTRAVENOUS

## 2014-05-14 NOTE — ED Notes (Signed)
Pt arrived via [facility] for a syncopal episode.  Episode was witnessed, no LOC.  Pt has history of Wolf Parkinsons White syndrome and has these episodes often per pt.  Pt also reports having a loop recorder and a hx of POTS.  Pt states she did hit her head on ground.  Bp-110/76 HR-88 R-18 O2-100% RA.  Pt ax4. CBG WNL.

## 2014-05-14 NOTE — Discharge Instructions (Signed)
Follow up with cardiology closely.  If you were given medicines take as directed.  If you are on coumadin or contraceptives realize their levels and effectiveness is altered by many different medicines.  If you have any reaction (rash, tongues swelling, other) to the medicines stop taking and see a physician.   Please follow up as directed and return to the ER or see a physician for new or worsening symptoms.  Thank you. Filed Vitals:   05/14/14 1149 05/14/14 1215  BP: 113/64 117/72  Pulse: 97 92  Temp: 98.9 F (37.2 C)   Resp: 16 20  SpO2: 100% 99%

## 2014-05-14 NOTE — ED Provider Notes (Signed)
CSN: [id number]     Arrival date & time 05/14/14  1148 History   First MD Initiated Contact with Patient 05/14/14 1152     Chief Complaint  Patient presents with  . Near Syncope     (Consider location/radiation/quality/duration/timing/severity/associated sxs/prior Treatment) HPI Comments: 24 year old female with history of WPW, recurrent syncope, [facility] Dr. Alfonso EllisBeaty presents after syncopal episode. Patient had lightheadedness and palpitation prior to event and then passed out. This is similar to multiple previous events. Increased frequency almost once per week. Patient has loop recorder is followed closely outpatient. No family history of cardiac issues of young age. No fevers chills or current pregnancy. Patient feels mild fatigue but at baseline otherwise. No chest pain or short of breath, no recent surgeries, no blood clot history.  Patient is a 24 y.o. female presenting with near-syncope. The history is provided by the patient.  Near Syncope Pertinent negatives include no chest pain, no abdominal pain, no headaches and no shortness of breath.    Past Medical History  Diagnosis Date  . WPW (Wolff-Parkinson-White syndrome)     s/p ablation (2011)  . Syncope     recurrent  . Vasovagal syncope   . POTS (postural orthostatic tachycardia syndrome)   . Palpitations     daily  . Hx of echocardiogram 04/2012    normal  . History of Holter monitoring 06/2013    "NSR, sinus tach, sinus arrythmia, one PVC, one PAC"  . Tilt table evaluation 02/2011    dx neurocardiogenic syncope   Past Surgical History  Procedure Laterality Date  . Cardiac electrophysiology study and ablation  2011    [facility]   No family history on file. History  Substance Use Topics  . Smoking status: Never Smoker   . Smokeless tobacco: Not on file  . Alcohol Use: No   OB History    No data available     Review of Systems  Constitutional: Negative for fever and chills.   HENT: Negative for congestion.   Eyes: Negative for visual disturbance.  Respiratory: Negative for shortness of breath.   Cardiovascular: Positive for near-syncope. Negative for chest pain.  Gastrointestinal: Negative for vomiting and abdominal pain.  Genitourinary: Negative for dysuria and flank pain.  Musculoskeletal: Negative for back pain, neck pain and neck stiffness.  Skin: Negative for rash.  Neurological: Positive for syncope and light-headedness. Negative for headaches.      Allergies  Amitiza; Ketorolac tromethamine; and Metoclopramide  Home Medications   Prior to Admission medications   Medication Sig Start Date End Date Taking? Authorizing Provider  midodrine (PROAMATINE) 5 MG tablet Take 5 mg by mouth 3 (three) times daily. 05/07/14 06/06/14 Yes Historical Provider, MD  aspirin EC 81 MG tablet Take 81 mg by mouth daily.    Historical Provider, MD  traMADol (ULTRAM) 50 MG tablet Take 1 tablet (50 mg total) by mouth every 6 (six) hours as needed. Patient not taking: Reported on 03/12/2014 03/04/14   Gerhard Munchobert Lockwood, MD   BP 117/72 mmHg  Pulse 92  Temp(Src) 98.9 F (37.2 C)  Resp 20  SpO2 99%  LMP 04/20/2014 Physical Exam  Constitutional: She is oriented to person, place, and time. She appears well-developed and well-nourished.  HENT:  Head: Normocephalic and atraumatic.  Dry mm  Eyes: Conjunctivae are normal. Right eye exhibits no discharge. Left eye exhibits no discharge.  Neck: Normal range of motion. Neck supple. No tracheal deviation present.  Cardiovascular: Normal rate, regular  rhythm and intact distal pulses.   No murmur heard. Pulmonary/Chest: Effort normal and breath sounds normal.  Abdominal: Soft. She exhibits no distension. There is no tenderness. There is no guarding.  Musculoskeletal: She exhibits no edema.  Neurological: She is alert and oriented to person, place, and time. No cranial nerve deficit.  Skin: Skin is warm. No rash noted.  Psychiatric:  She has a normal mood and affect.  Nursing note and vitals reviewed.   ED Course  Procedures (including critical care time) Labs Review Labs Reviewed  I-STAT CHEM 8, ED - Abnormal; Notable for the following:    Hemoglobin 11.9 (*)    HCT 35.0 (*)    All other components within normal limits  I-STAT BETA HCG BLOOD, ED ([facility], [facility], [facility] ONLY)    Imaging Review No results found.   EKG Interpretation   Date/Time:  Wednesday May 14 2014 11:51:32 EST Ventricular Rate:  89 PR Interval:  117 QRS Duration: 76 QT Interval:  345 QTC Calculation: 420 R Axis:   79 Text Interpretation:  Sinus rhythm Borderline short PR interval similar to  previous Confirmed by Luvern Mcisaac  MD, Martrice Apt ([id number]) on 05/14/2014 11:56:18 AM      MDM   Final diagnoses:  Syncope and collapse  WOLFF-PARKINSON-WHITE (WPW) SYNDROME status post ablation   Patient with recurrent syncope, mildly hydrate clinically. Plan for electrolyte, IV fluid bolus and pregnancy test. Results reviewed unremarkable significant anemia. This recurrent issue in patients following outpatient closely. Plan to discuss close outpatient follow-up with cardiology on-call for Dr. Alfonso Ellis. EKG reviewed similar previous short PR.  Patient improved in ER. Discussed with cardiology on-call at [facility] who felt workup was adequate and will follow-up closely outpatient. Results and differential diagnosis were discussed with the patient/parent/guardian. Close follow up outpatient was discussed, comfortable with the plan.   Medications  sodium chloride 0.9 % bolus 1,000 mL (1,000 mLs Intravenous New Bag/Given 05/14/14 1315)    Filed Vitals:   05/14/14 1149 05/14/14 1215  BP: 113/64 117/72  Pulse: 97 92  Temp: 98.9 F (37.2 C)   Resp: 16 20  SpO2: 100% 99%    Final diagnoses:  Syncope and collapse  WOLFF-PARKINSON-WHITE (WPW) SYNDROME status post ablation        Enid Skeens, MD 05/14/14 1339

## 2014-05-25 ENCOUNTER — Encounter (HOSPITAL_COMMUNITY): Payer: Self-pay | Admitting: Emergency Medicine

## 2014-05-25 ENCOUNTER — Emergency Department (HOSPITAL_COMMUNITY)
Admission: EM | Admit: 2014-05-25 | Discharge: 2014-05-25 | Disposition: A | Discharge status: Home / Self Care | Payer: BLUE CROSS/BLUE SHIELD | Attending: Emergency Medicine | Admitting: Emergency Medicine

## 2014-05-25 DIAGNOSIS — I456 Pre-excitation syndrome: Secondary | ICD-10-CM | POA: Diagnosis not present

## 2014-05-25 DIAGNOSIS — Z7982 Long term (current) use of aspirin: Secondary | ICD-10-CM | POA: Diagnosis not present

## 2014-05-25 DIAGNOSIS — R55 Syncope and collapse: Secondary | ICD-10-CM | POA: Diagnosis present

## 2014-05-25 DIAGNOSIS — Z3202 Encounter for pregnancy test, result negative: Secondary | ICD-10-CM | POA: Diagnosis not present

## 2014-05-25 DIAGNOSIS — Z79899 Other long term (current) drug therapy: Secondary | ICD-10-CM | POA: Diagnosis not present

## 2014-05-25 DIAGNOSIS — I951 Orthostatic hypotension: Secondary | ICD-10-CM

## 2014-05-25 DIAGNOSIS — G90A Postural orthostatic tachycardia syndrome (POTS): Secondary | ICD-10-CM

## 2014-05-25 DIAGNOSIS — G909 Disorder of the autonomic nervous system, unspecified: Secondary | ICD-10-CM | POA: Insufficient documentation

## 2014-05-25 DIAGNOSIS — R000 Tachycardia, unspecified: Secondary | ICD-10-CM

## 2014-05-25 LAB — CBC WITH DIFFERENTIAL/PLATELET
Basophils Absolute: 0 10*3/uL (ref 0.0–0.1)
Basophils Relative: 0 % (ref 0–1)
Eosinophils Absolute: 0 10*3/uL (ref 0.0–0.7)
Eosinophils Relative: 1 % (ref 0–5)
HCT: 31.7 % — ABNORMAL LOW (ref 36.0–46.0)
Hemoglobin: 10.1 g/dL — ABNORMAL LOW (ref 12.0–15.0)
LYMPHS ABS: 2.3 10*3/uL (ref 0.7–4.0)
Lymphocytes Relative: 45 % (ref 12–46)
MCH: 27.4 pg (ref 26.0–34.0)
MCHC: 31.9 g/dL (ref 30.0–36.0)
MCV: 86.1 fL (ref 78.0–100.0)
MONO ABS: 0.3 10*3/uL (ref 0.1–1.0)
Monocytes Relative: 6 % (ref 3–12)
NEUTROS PCT: 48 % (ref 43–77)
Neutro Abs: 2.4 10*3/uL (ref 1.7–7.7)
Platelets: 200 10*3/uL (ref 150–400)
RBC: 3.68 MIL/uL — ABNORMAL LOW (ref 3.87–5.11)
RDW: 12 % (ref 11.5–15.5)
WBC: 5.1 10*3/uL (ref 4.0–10.5)

## 2014-05-25 LAB — URINALYSIS, ROUTINE W REFLEX MICROSCOPIC
Bilirubin Urine: NEGATIVE
Glucose, UA: NEGATIVE mg/dL
HGB URINE DIPSTICK: NEGATIVE
KETONES UR: NEGATIVE mg/dL
LEUKOCYTES UA: NEGATIVE
NITRITE: NEGATIVE
Protein, ur: NEGATIVE mg/dL
Specific Gravity, Urine: 1.005 (ref 1.005–1.030)
UROBILINOGEN UA: 0.2 mg/dL (ref 0.0–1.0)
pH: 7 (ref 5.0–8.0)

## 2014-05-25 LAB — POC URINE PREG, ED: Preg Test, Ur: NEGATIVE

## 2014-05-25 LAB — BASIC METABOLIC PANEL
Anion gap: 4 — ABNORMAL LOW (ref 5–15)
BUN: 9 mg/dL (ref 6–23)
CO2: 30 mmol/L (ref 19–32)
Calcium: 9.7 mg/dL (ref 8.4–10.5)
Chloride: 105 mmol/L (ref 96–112)
Creatinine, Ser: 0.7 mg/dL (ref 0.50–1.10)
Glucose, Bld: 88 mg/dL (ref 70–99)
Potassium: 4.3 mmol/L (ref 3.5–5.1)
SODIUM: 139 mmol/L (ref 135–145)

## 2014-05-25 LAB — CBG MONITORING, ED: Glucose-Capillary: 75 mg/dL (ref 70–99)

## 2014-05-25 MED ORDER — SODIUM CHLORIDE 0.9 % IV BOLUS (SEPSIS)
1000.0000 mL | INTRAVENOUS | Status: AC
  Administered 2014-05-25: 1000 mL via INTRAVENOUS

## 2014-05-25 NOTE — ED Notes (Signed)
PA in room

## 2014-05-25 NOTE — ED Notes (Signed)
To ED via [facility] from home with c/o syncopal episode at home, has hx of same-- hx of WPW with loop recorder. Has had similar episodes-- was here 2 weeks for same-- [facility] 2[facility]2 -- passed out after appt-- states "I had 10 compressions and woke up in the ER, stayed for hours, but was discharged."  Pt was talking to her pastor today, turned quickly, and "felt it coming on-- have pastor on speed dial, called him before I passed out" When Woke up-- "I was very disoriented. Head hurts, felt heart racing"

## 2014-05-25 NOTE — ED Provider Notes (Signed)
CSN: [id number]     Arrival date & time 05/25/14  1425 History   First MD Initiated Contact with Patient 05/25/14 1430     Chief Complaint  Patient presents with  . Near Syncope   (Consider location/radiation/quality/duration/timing/severity/associated sxs/prior Treatment) HPI Desiree Gutierrez is a 24 yo female rpesenting with report of syncopal episode today,.  She reprots she was talking to her pastor appr 30 min PTA when she felt her heart begin to race.  This is a usual sensation before she passes out.  She began to feel light-headed and then does not recall what happened next.  Her pastor was there when she passed out and estimates it was appr 5 min before she resumed her normal mental status.  Immediately after waking up she reports feeling a headache, some chest pain and shortness of breath but this has all resolved except for a frontal headache that she rates as 7/10.  She denies chest pain, shortness of breath or headache before the syncopal episode.  Her lmp was Feb 13th.    Past Medical History  Diagnosis Date  . WPW (Wolff-Parkinson-White syndrome)     s/p ablation (2011)  . Syncope     recurrent  . Vasovagal syncope   . POTS (postural orthostatic tachycardia syndrome)   . Palpitations     daily  . Hx of echocardiogram 04/2012    normal  . History of Holter monitoring 06/2013    "NSR, sinus tach, sinus arrythmia, one PVC, one PAC"  . Tilt table evaluation 02/2011    dx neurocardiogenic syncope   Past Surgical History  Procedure Laterality Date  . Cardiac electrophysiology study and ablation  2011    [facility]   No family history on file. History  Substance Use Topics  . Smoking status: Never Smoker   . Smokeless tobacco: Not on file  . Alcohol Use: No   OB History    No data available     Review of Systems  Constitutional: Negative for fever and chills.  HENT: Negative for sore throat.   Eyes: Negative for visual disturbance.  Respiratory:  Negative for cough and shortness of breath.   Cardiovascular: Negative for chest pain and leg swelling.  Gastrointestinal: Negative for nausea, vomiting and diarrhea.  Genitourinary: Negative for dysuria.  Musculoskeletal: Negative for myalgias.  Skin: Negative for rash.  Neurological: Positive for syncope and light-headedness. Negative for weakness, numbness and headaches.    Allergies  Amitiza; Ketorolac tromethamine; and Metoclopramide  Home Medications   Prior to Admission medications   Medication Sig Start Date End Date Taking? Authorizing Provider  aspirin EC 81 MG tablet Take 81 mg by mouth daily.    Historical Provider, MD  midodrine (PROAMATINE) 5 MG tablet Take 5 mg by mouth 3 (three) times daily. 05/07/14 06/06/14  Historical Provider, MD  traMADol (ULTRAM) 50 MG tablet Take 1 tablet (50 mg total) by mouth every 6 (six) hours as needed. Patient not taking: Reported on 03/12/2014 03/04/14   Gerhard Munchobert Lockwood, MD   BP 109/68 mmHg  Pulse 89  Temp(Src) 98.5 F (36.9 C) (Oral)  Resp 17  Wt 131 lb (59.421 kg)  SpO2 100%  LMP 05/17/2014 Physical Exam  Constitutional: She is oriented to person, place, and time. She appears well-developed and well-nourished. No distress.  HENT:  Head: Normocephalic and atraumatic.  Mouth/Throat: Oropharynx is clear and moist. No oropharyngeal exudate.  Eyes: Conjunctivae are normal.  Neck: Neck supple. No thyromegaly present.  Cardiovascular:  Normal rate, regular rhythm and intact distal pulses.  Exam reveals no gallop and no friction rub.   No murmur heard. Pulmonary/Chest: Effort normal and breath sounds normal. No respiratory distress. She has no wheezes. She has no rales. She exhibits no tenderness.  Abdominal: Soft. There is no tenderness.  Musculoskeletal: She exhibits no tenderness.  Lymphadenopathy:    She has no cervical adenopathy.  Neurological: She is alert and oriented to person, place, and time. She has normal strength. No cranial  nerve deficit or sensory deficit. Coordination normal. GCS eye subscore is 4. GCS verbal subscore is 5. GCS motor subscore is 6.  Cranial nerves 2-12 intact  Skin: Skin is warm and dry. No rash noted. She is not diaphoretic.  Psychiatric: She has a normal mood and affect.  Nursing note and vitals reviewed.   ED Course  Procedures (including critical care time) Labs Review Labs Reviewed  CBC WITH DIFFERENTIAL/PLATELET - Abnormal; Notable for the following:    RBC 3.68 (*)    Hemoglobin 10.1 (*)    HCT 31.7 (*)    All other components within normal limits  BASIC METABOLIC PANEL - Abnormal; Notable for the following:    Anion gap 4 (*)    All other components within normal limits  URINALYSIS, ROUTINE W REFLEX MICROSCOPIC - Abnormal; Notable for the following:    Color, Urine STRAW (*)    All other components within normal limits  CBG MONITORING, ED  POC URINE PREG, ED    Imaging Review No results found.   EKG Interpretation   Date/Time:  Sunday May 25 2014 14:41:00 EST Ventricular Rate:  93 PR Interval:  121 QRS Duration: 77 QT Interval:  340 QTC Calculation: 423 R Axis:   80 Text Interpretation:  Sinus rhythm No significant change since last  tracing Confirmed by JACUBOWITZ  MD, SAM [phone]) on 05/25/2014 2:44:16 PM      MDM   Final diagnoses:  Vasovagal syncope  WPW (Wolff-Parkinson-White syndrome)  POTS (postural orthostatic tachycardia syndrome)   24 yo with recurrent syncopal episodes.  Recently seen for the same 11 days ago.  She is a pt of Dr. Cristi Loron at [facility] cardiology.  She is back to her baseline on exam. CBC, BMP, UA, Upreg, NS bolus. Labs resulted and no significant abnormalities, however her hemoglobin mildly decreased from last analysis: 10.1.  Pt denies any concerns for vomiting blood or bloody stools. She does report a heavy period since her last ER visit. EKG is unchanged from last visit.  Consulted Dr. Cristi Loron, cardiologist at [facility], regarding  her work-up, exam and results. Dr. Cristi Loron recommends no further work-up and pt is safe to go home.  She will be contacted by him or his office to discuss follow-up.  Discussed all findings and Dr. Rexene Agent recommendations with pt.  Answered all questions. Pt is well-appearing, in no acute distress and vital signs reviewed and not concerning. She appears safe to be discharged.  Discharge include follow-up with her cardiologist.  Return precautions provided. Pt aware of plan and in agreement.   Filed Vitals:   05/25/14 1545 05/25/14 1600 05/25/14 1615 05/25/14 1630  BP: 108/74 114/81 110/67 120/78  Pulse: 81 89 83 79  Temp:      TempSrc:      Resp: Weight:      SpO2: 100% 100% 100% 100%   Meds given in ED:  Medications  sodium chloride 0.9 % bolus 1,000 mL (0 mLs Intravenous  Stopped 05/25/14 1643)    Discharge Medication List as of 05/25/2014  4:38 PM         Harle Battiest, NP 05/26/14 1610  Doug Sou, MD 05/26/14 0110

## 2014-05-25 NOTE — ED Notes (Signed)
Pt verbalized understanding of discharge instructions and has no further questions. Pt being discharged with friend driving.

## 2014-05-25 NOTE — ED Notes (Signed)
Pt unable to sign discharge form due to signature pad not working.

## 2014-05-25 NOTE — Discharge Instructions (Signed)
Please follow the directions provided.  Dr. Cristi LoronBeatty will call you to check on you and discuss follow-up.  Drink plenty of fluids to stay hydrated. Change your position slowly.  Don't hesitate to return for any new, worsening or concerning symptoms.      SEEK IMMEDIATE MEDICAL CARE IF:  You feel palpitations that are frequent or continual.  You develop chest pain and also have:  Shortness of breath or difficulty breathing.  Nausea and vomiting.  Sweating. You become light-headed or faint (pass out).

## 2014-05-29 ENCOUNTER — Encounter (HOSPITAL_COMMUNITY): Payer: Self-pay | Admitting: *Deleted

## 2014-05-29 ENCOUNTER — Other Ambulatory Visit (HOSPITAL_COMMUNITY): Payer: Self-pay

## 2014-05-29 ENCOUNTER — Observation Stay (HOSPITAL_COMMUNITY)
Admission: EM | Admit: 2014-05-29 | Discharge: 2014-06-02 | Disposition: A | Discharge status: Home / Self Care | Payer: Medicaid Other | Attending: Internal Medicine | Admitting: Internal Medicine

## 2014-05-29 DIAGNOSIS — W19XXXA Unspecified fall, initial encounter: Secondary | ICD-10-CM | POA: Insufficient documentation

## 2014-05-29 DIAGNOSIS — R000 Tachycardia, unspecified: Secondary | ICD-10-CM

## 2014-05-29 DIAGNOSIS — G90A Postural orthostatic tachycardia syndrome (POTS): Secondary | ICD-10-CM

## 2014-05-29 DIAGNOSIS — R519 Headache, unspecified: Secondary | ICD-10-CM

## 2014-05-29 DIAGNOSIS — E162 Hypoglycemia, unspecified: Secondary | ICD-10-CM | POA: Insufficient documentation

## 2014-05-29 DIAGNOSIS — Z79899 Other long term (current) drug therapy: Secondary | ICD-10-CM | POA: Diagnosis not present

## 2014-05-29 DIAGNOSIS — I456 Pre-excitation syndrome: Secondary | ICD-10-CM | POA: Diagnosis present

## 2014-05-29 DIAGNOSIS — I951 Orthostatic hypotension: Secondary | ICD-10-CM

## 2014-05-29 DIAGNOSIS — R51 Headache: Secondary | ICD-10-CM

## 2014-05-29 DIAGNOSIS — D649 Anemia, unspecified: Secondary | ICD-10-CM

## 2014-05-29 DIAGNOSIS — R55 Syncope and collapse: Secondary | ICD-10-CM | POA: Diagnosis not present

## 2014-05-29 DIAGNOSIS — D509 Iron deficiency anemia, unspecified: Secondary | ICD-10-CM

## 2014-05-29 LAB — BASIC METABOLIC PANEL
ANION GAP: 5 (ref 5–15)
BUN: 9 mg/dL (ref 6–23)
CHLORIDE: 104 mmol/L (ref 96–112)
CO2: 27 mmol/L (ref 19–32)
Calcium: 9.5 mg/dL (ref 8.4–10.5)
Creatinine, Ser: 0.74 mg/dL (ref 0.50–1.10)
GFR calc Af Amer: >90 mL/min (ref >90)
GFR calc non Af Amer: >90 mL/min (ref >90)
GLUCOSE: 83 mg/dL (ref 70–99)
Potassium: 3.9 mmol/L (ref 3.5–5.1)
SODIUM: 136 mmol/L (ref 135–145)

## 2014-05-29 LAB — CBC
HCT: 36.3 % (ref 36.0–46.0)
Hemoglobin: 11.4 g/dL — ABNORMAL LOW (ref 12.0–15.0)
MCH: 27.8 pg (ref 26.0–34.0)
MCHC: 31.4 g/dL (ref 30.0–36.0)
MCV: 88.5 fL (ref 78.0–100.0)
PLATELETS: 209 10*3/uL (ref 150–400)
RBC: 4.1 MIL/uL (ref 3.87–5.11)
RDW: 12.2 % (ref 11.5–15.5)
WBC: 4.9 10*3/uL (ref 4.0–10.5)

## 2014-05-29 LAB — CBG MONITORING, ED: Glucose-Capillary: 71 mg/dL (ref 70–99)

## 2014-05-29 LAB — GLUCOSE, CAPILLARY
GLUCOSE-CAPILLARY: 105 mg/dL — AB (ref 70–99)
GLUCOSE-CAPILLARY: 87 mg/dL (ref 70–99)
Glucose-Capillary: 84 mg/dL (ref 70–99)

## 2014-05-29 LAB — I-STAT BETA HCG BLOOD, ED (MC, WL, AP ONLY)

## 2014-05-29 LAB — MRSA PCR SCREENING: MRSA BY PCR: NEGATIVE

## 2014-05-29 MED ORDER — HEPARIN SODIUM (PORCINE) 5000 UNIT/ML IJ SOLN
5000.0000 [IU] | Freq: Three times a day (TID) | INTRAMUSCULAR | Status: DC
  Filled 2014-05-29: qty 1

## 2014-05-29 MED ORDER — ACETAMINOPHEN 325 MG PO TABS
650.0000 mg | ORAL_TABLET | Freq: Once | ORAL | Status: AC
  Administered 2014-05-29: 650 mg via ORAL
  Filled 2014-05-29: qty 2

## 2014-05-29 MED ORDER — SODIUM CHLORIDE 0.9 % IJ SOLN
3.0000 mL | Freq: Two times a day (BID) | INTRAMUSCULAR | Status: DC
Start: 2014-05-29 — End: 2014-06-02
  Administered 2014-05-29 – 2014-06-02 (×5): 3 mL via INTRAVENOUS

## 2014-05-29 NOTE — H&P (Signed)
Date: 05/29/2014               Patient Name:  Desiree Gutierrez MRN: [id number]  DOB: 01/15/1991 Age / Sex: 24 y.o., female   PCP: No Pcp Per Patient              Medical Service: Internal Medicine Teaching Service              Attending Physician: Dr. Farley Ly, MD    First Contact: Adriana Mccallum, MS 3 Pager: [phone]  Second Contact: Dr. Farley Ly Pager: [phone]-  Third Contact Dr. Evelena Peat Pager: [phone]-       After Hours (After 5p/  First Contact Pager: [phone]  weekends / holidays): Second Contact Pager: [phone]   Chief Complaint: syncope  History of Present Illness: Ms. Craun is a 24 y/o female with a history of Wolf-Parkinson-White syndrome s/p ablation in 2011 and secondary POTS who reported to the ED after syncope while standing in line at the bank.  Pt reports feeling some lightheadedness, dizziness and heart racing before she went unconscious.  She woke up face down on the ground, confused and disoriented for about 5 mins.  Denies losing bowel or bladder control.  States the paramedics had arrived by the time she regained consciousness and reported her glucose to be somewhere in the 50's.  Reports eating a full breakfast this morning.  Pt was told that she had remained unconscious for about 5 mins.  No reports of jerking or spastic movements.  States she hit her head and had a headache after.  It has resolved since taking a dose of tylenol.  Pt had a previous episode of syncope this past Sunday with similar symptoms.  Per ED note, her cardiologist was consulted and recommended pt be followed up as outpt so she was not admitted at the time.  She also reports having low glucose after eating, which she checks at home with her mother's glucometer.  States about 2 hrs after eating a full meal, her glucose levels have been between 50 and 60.  Denies having anything like this before this past Tuesday.  She informed her cardiologist and was in the process of scheduling any  appointment with a PCP some time next week.  Pertinent medical history of POTS, secondary to her arrhythmia. Pt stopped taking Midodrine due to severe, debilitating headaches last Tuesday after speaking with her cardiologist's nurse.  Denies any shortness of breath, chest pain, or injury from falling.  Pt wearing a Halter monitor.              Meds: None reported  Allergies: Allergies as of 05/29/2014 - Review Complete 05/29/2014  Allergen Reaction Noted  . Amitiza [lubiprostone] Anaphylaxis, Shortness Of Breath, and Swelling 07/17/2013  . Ketorolac tromethamine Other (See Comments) 01/07/2014  . Metoclopramide Other (See Comments) 01/07/2014  . Midodrine Other (See Comments) 05/25/2014   Past Medical History  Diagnosis Date  . WPW (Wolff-Parkinson-White syndrome)     s/p ablation (2011)  . Syncope     recurrent  . Vasovagal syncope   . POTS (postural orthostatic tachycardia syndrome)   . Palpitations     daily  . Hx of echocardiogram 04/2012    normal  . History of Holter monitoring 06/2013    "NSR, sinus tach, sinus arrythmia, one PVC, one PAC"  . Tilt table evaluation 02/2011    dx neurocardiogenic syncope   Past Surgical History  Procedure Laterality Date  . Cardiac electrophysiology study  and ablation  2011    [facility] at [facility] History: Mother:  Stroke 2015 Diabetes Hypertension Family hx of Cardiac disease or arrhythmia:  unknown  Social History Main Topics   Smoking status: Never Smoker   . Smokeless tobacco: Not on file  . Alcohol Use: No  . Drug Use: No  . Sexual Activity: Not on file    Review of Systems: Constitutional: negative, well appearing, in NAD Respiratory: negative for cough and shortness of breath Cardiovascular: negative for chest pain, chest pressure/discomfort and no palpitations since syncopal episode.  One instance of tachycardia while waiting in ED, symptoms self resolved. Gastrointestinal: negative for abdominal  pain, nausea and vomiting  Physical Exam: Blood pressure 115/76, pulse 103, temperature 98.3 F (36.8 C), temperature source Oral, resp. rate 23, height  (1.6 m), weight 59.875 kg (132 lb), last menstrual period 05/17/2014, SpO2 100 %. General appearance: alert, cooperative and sitting upright in bed, no acute distress Head: Normocephalic, without obvious abnormality Lungs: clear to auscultation bilaterally Heart: regular rate and rhythm, S1, S2 normal, no murmur, click, rub or gallop Abdomen: soft, non-tender; bowel sounds normal; no masses,  no organomegaly Extremities: extremities normal, atraumatic, no cyanosis or edema Pulses: 2+ and symmetric Skin: Skin color, texture, turgor normal. No rashes or lesions  Lab results: Metabolic Panel  Ref. Range 05/29/2014 11:27  Sodium Latest Range: 135-145 mmol/L 136  Potassium Latest Range: 3.5-5.1 mmol/L 3.9  Chloride Latest Range: 96-112 mmol/L 104  CO2 Latest Range: 19-32 mmol/L 27  BUN Latest Range: 6-23 mg/dL 9  Creatinine Latest Range: 0.50-1.10 mg/dL 0.98  Calcium Latest Range: 8.4-10.5 mg/dL 9.5  GFR calc non Af Amer Latest Range: >90 mL/min >90  GFR calc Af Amer Latest Range: >90 mL/min >90  Glucose Latest Range: 70-99 mg/dL 83  Anion gap Latest Range: 5-15  5   CBC   Ref. Range 05/29/2014 11:27  WBC Latest Range: 4.0-10.5 K/uL 4.9  RBC Latest Range: 3.87-5.11 MIL/uL 4.10  Hemoglobin Latest Range: 12.0-15.0 g/dL 11.9 (L)  HCT Latest Range: 36.0-46.0 % 36.3  MCV Latest Range: 78.0-100.0 fL 88.5  MCH Latest Range: 26.0-34.0 pg 27.8  MCHC Latest Range: 30.0-36.0 g/dL 14.7  RDW Latest Range: 11.5-15.5 % 12.2  Platelets Latest Range: 150-400 K/uL 209   Other results: EKG: unchanged from previous tracings.  Assessment & Plan by Problem: #syncope Pt's loss of consciousness is likely due to her POTS diagnosis.  She had been standing for a extended length of time which could have possibly exacerbated her symptoms.   Hypoglycemia is likely as well given her recent reports of low glucose after eating meals.  Per pt, her glucose measured on scene was 50-60.  Seizure is least likely.  She has no prior history of seizures, no post ictal state, no medications that would induce seizures, denies drug use, and no reports of jerking motions during unconscious state.  Pt is currently on no medications but did stop taking her medication for POTS 2 days prior to this episode.  However, she had a similar episode Sunday, while taking the medication.   -Consult cardiology -continuous monitoring  #Hypoglycemia Possibly post prandial hypoglycemia, pt has reported low glucose readings but no reports of symptoms such as tremulousness and palpitations.  A likely chance that pt could have functional hyperinsulinism. Pt does not have a history of diabetes, making exogenous insulin less likely.  No indications that pt is purposely taking exogenous insulin, however, will explore option if  necessary.  Low glucose levels do not appear to be related to WPW but the tachycardia could cause an increase in epinephrine and cortisol causing a decrease in glucose.  This seems less likely.   -serial post prandial glucose checks at 1hr and 2hr -check insulin labs if glucose levels remain low  #WPW syndrome S/p ablation in 2011.  Pt wearing Halter monitor and is followed closely by her cardiologist. -Consult cardiologist   This is a Medical Student Note.  The care of the patient was discussed with Dr. Meredith PelJoines and the assessment and plan was formulated with their assistance.  Please see their note for official documentation of the patient encounter.   Signed: Sonda RumbleKeyonna M Bernd Crom, Med Student 05/29/2014, 3:21 PM

## 2014-05-29 NOTE — ED Notes (Signed)
Internal medicine paged re: pts increase HR

## 2014-05-29 NOTE — ED Notes (Signed)
Andrey CampanileWilson, MD returned call, updated on increased HR, internal medicine to see the pt

## 2014-05-29 NOTE — ED Notes (Signed)
[employer] loop recorder interogated

## 2014-05-29 NOTE — ED Notes (Signed)
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 Alfonso EllisBeaty, pts CBG 65 per EMS, pt A&O x4, follows commands, speaks in complete sentences, pt c/o HA 7/10, pt reports racing heart prior to syncopal episode, pt fell from standing position & reports hitting her head, denies neck & back pain

## 2014-05-29 NOTE — H&P (Signed)
Date: 05/29/2014               Patient Name:  Desiree BassetDemetri Shan MRN: [id number]  DOB: 09/30/1990 Age / Sex: 24 y.o., female   PCP: No Pcp Per Patient         Medical Service: Internal Medicine Teaching Service         Attending Physician: Dr. Farley LyJerry Dale Joines, MD    First Contact: Dr. Farley LyAdam Giavanna Kang Pager: [phone]  Second Contact: Dr. Evelena PeatAlex Wilson Pager: [phone]       After Hours (After 5p/  First Contact Pager: [phone]  weekends / holidays): Second Contact Pager: [phone]   Chief Complaint: syncope  History of Present Illness: Ms Sabas SousCephus is a 24 year old woman with Wolff-Parkinson-White syndrome [facility] who presents with syncope. 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 She fell from a standing position and hit her head. She then reported a headache but it has largely resolved with tylenol. She said she was initially confused as she usually is right after her syncopal episodes but denied any bowel or bladder incontinence. EMS noted CBG of 65. She says she has had issues with hypoglycemia (checks using her mom's meter) down to 50s-60s even a few hours after eating. She [facility] and had a loop recorder inserted in 01/2014. He also just stopped midodrine a week ago due to headaches. He was contacted by ED staff who recommended admission.   In the ED, she had spontaneous episode of tachycardia at 164 which spontaneously recovered in less than 5 minutes. She now reports completely negative review of systems including chest pain, palpitations, SOB.  Of note, she was here 05/25/14 for syncopal episode and they called Dr Cristi LoronBeatty who recommended no work-up at that time.    Meds: No current facility-administered medications for this encounter.   Current Outpatient Prescriptions  Medication Sig Dispense Refill  . Multiple  Vitamin (MULTIVITAMIN WITH MINERALS) TABS tablet Take 1 tablet by mouth daily.    . traMADol (ULTRAM) 50 MG tablet Take 1 tablet (50 mg total) by mouth every 6 (six) hours as needed. (Patient not taking: Reported on 03/12/2014) 15 tablet 0    Allergies: Allergies as of 05/29/2014 - Review Complete 05/29/2014  Allergen Reaction Noted  . Amitiza [lubiprostone] Anaphylaxis, Shortness Of Breath, and Swelling 07/17/2013  . Ketorolac tromethamine Other (See Comments) 01/07/2014  . Metoclopramide Other (See Comments) 01/07/2014  . Midodrine Other (See Comments) 05/25/2014   Past Medical History  Diagnosis Date  . WPW (Wolff-Parkinson-White syndrome)     s/p ablation (2011)  . Syncope     recurrent  . Vasovagal syncope   . POTS (postural orthostatic tachycardia syndrome)   . Palpitations     daily  . Hx of echocardiogram 04/2012    normal  . History of Holter monitoring 06/2013    "NSR, sinus tach, sinus arrythmia, one PVC, one PAC"  . Tilt table evaluation 02/2011    dx neurocardiogenic syncope   Past Surgical History  Procedure Laterality Date  . Cardiac electrophysiology study and ablation  2011    [facility]   No family history on file. History   Social History  . Marital Status: Single    Spouse Name: N/A  . Number of Children: N/A  . Years of Education: N/A  Occupational History  . Not on file.   Social History Main Topics  . Smoking status: Never Smoker   . Smokeless tobacco: Not on file  . Alcohol Use: No  . Drug Use: No  . Sexual Activity: Not on file   Other Topics Concern  . Not on file   Social History Narrative    Review of Systems: A comprehensive review of systems was negative except for: as noted above per HPI  Physical Exam: Blood pressure 113/83, pulse 94, temperature 98.3 F (36.8 C), temperature source Oral, resp. rate 13, height  (1.6 m), weight 132 lb (59.875 kg), last menstrual period 05/17/2014, SpO2 100 %.  Gen: A&O x  4, no acute distress, well developed, well nourished HEENT: Atraumatic, PERRL, EOMI, sclerae anicteric, moist mucous membranes Heart: Regular rate and rhythm, normal S1 S2, no murmurs, rubs, or gallops Lungs: Clear to auscultation bilaterally, respirations unlabored Abd: Soft, non-tender, non-distended, + bowel sounds, no hepatosplenomegaly Ext: No edema or cyanosis  Lab results: Basic Metabolic Panel:  Recent Labs  95/62/13 1127  NA 136  K 3.9  CL 104  CO2 27  GLUCOSE 83  BUN 9  CREATININE 0.74  CALCIUM 9.5   CBC:  Recent Labs  05/29/14 1127  WBC 4.9  HGB 11.4*  HCT 36.3  MCV 88.5  PLT 209     Recent Labs  05/29/14 1125  GLUCAP 71   Urine Drug Screen: Drugs of Abuse     Component Value Date/Time   LABOPIA NONE DETECTED 01/21/2014 1234   COCAINSCRNUR NONE DETECTED 01/21/2014 1234   LABBENZ NONE DETECTED 01/21/2014 1234   AMPHETMU NONE DETECTED 01/21/2014 1234   THCU NONE DETECTED 01/21/2014 1234   LABBARB NONE DETECTED 01/21/2014 1234    Imaging results:  No results found.  Other results: EKG: NSR, no t-wave or ST changes compared to priort 05/25/14  Assessment & Plan by Problem: Active Problems:   * No active hospital problems. *  #Syncope 2/2 Postural Orthostatic Tachycardia/Wolf-Parkinson-White: Ms Mcafee has known Wolf-Parkinson-White. She is s/p ablation in 2011 by Dr Joycelyn Man of [facility] and currently followed by Dr Cristi Loron at [facility]. She says that this episode is typical for her condition. Her loop recorder was implanted in 01/2014 and was interrogated. This report appears notable for recorded episodes of tachycardia reportedly in the 270s that lasted 1 and 4 minutes on 05/15/14. -cardiology consult -cardiac monitoring  #Postprandial hypoglycemia: EMS reports CBG 65 in field. Here lowest CBG 71. She says she checks her sugar at home using her mom's meter and it can be in the 50s-60s a few hours after eating. This was shared with Dr  Cristi Loron who recommended she get a PCP to further evaluate. -POC CBG tidac and hs  #Anemia: Presenting hemoglobin of 11.4 was 10.1 on 2/21 ED presentation. Baseline is around 11.5. Last anemia panel 10/2010 with iron 47, TIBC 377, ferritin 13. -cont to monitor  #Diet: regular  #DVT PPx: heparin 5000 u [place] tid  #Code: full  Dispo: Disposition is deferred at this time, awaiting improvement of current medical problems. Anticipated discharge in approximately 1-2 day(s).   The patient does not have a current PCP (No Pcp Per Patient) and does need an [facility] hospital follow-up appointment after discharge.  The patient does not have transportation limitations that hinder transportation to clinic appointments.  Signed: Lorenda Hatchet, MD 05/29/2014, 3:46 PM

## 2014-05-29 NOTE — ED Provider Notes (Signed)
CSN: [id number]     Arrival date & time 05/29/14  1113 History   First MD Initiated Contact with Patient 05/29/14 1115     Chief Complaint  Patient presents with  . Loss of Consciousness     (Consider location/radiation/quality/duration/timing/severity/associated sxs/prior Treatment) HPI..... Syncopal episode while standing in line at the bank. Patient has had multiple episodes of same in the past. She is seen by Dr. Alfonso Ellis [electrophysiology] at [facility].  Status post insertion of loop recorder in October 2015. She was seen here on 05/25/2014 with similar symptoms. Past medical history includes Wolff-Parkinson-White syndrome with ablation in 2011, POTS, vasovagal syncope.  Her blood sugar has been borderline low recently despite eating.  No frank chest pain, dyspnea, diaphoresis, nausea.  She is feeling better now.  Past Medical History  Diagnosis Date  . WPW (Wolff-Parkinson-White syndrome)     s/p ablation (2011)  . Syncope     recurrent  . Vasovagal syncope   . POTS (postural orthostatic tachycardia syndrome)   . Palpitations     daily  . Hx of echocardiogram 04/2012    normal  . History of Holter monitoring 06/2013    "NSR, sinus tach, sinus arrythmia, one PVC, one PAC"  . Tilt table evaluation 02/2011    dx neurocardiogenic syncope   Past Surgical History  Procedure Laterality Date  . Cardiac electrophysiology study and ablation  2011    Dr. Gevena Cotton at [facility]   No family history on file. History  Substance Use Topics  . Smoking status: Never Smoker   . Smokeless tobacco: Not on file  . Alcohol Use: No   OB History    No data available     Review of Systems  All other systems reviewed and are negative.     Allergies  Amitiza; Ketorolac tromethamine; Metoclopramide; and Midodrine  Home Medications   Prior to Admission medications   Medication Sig Start Date End Date Taking? Authorizing Provider  Multiple Vitamin (MULTIVITAMIN WITH MINERALS) TABS  tablet Take 1 tablet by mouth daily.   Yes Historical Provider, MD  traMADol (ULTRAM) 50 MG tablet Take 1 tablet (50 mg total) by mouth every 6 (six) hours as needed. Patient not taking: Reported on 03/12/2014 03/04/14   Gerhard Munch, MD   BP 115/71 mmHg  Pulse 93  Temp(Src) 98.3 F (36.8 C) (Oral)  Resp 17  Ht  (1.6 m)  Wt 132 lb (59.875 kg)  BMI 23.39 kg/m2  SpO2 100%  LMP 05/17/2014 Physical Exam  Constitutional: She is oriented to person, place, and time. She appears well-developed and well-nourished.  HENT:  Head: Normocephalic and atraumatic.  Eyes: Conjunctivae and EOM are normal. Pupils are equal, round, and reactive to light.  Neck: Normal range of motion. Neck supple.  Cardiovascular: Normal rate and regular rhythm.   Pulmonary/Chest: Effort normal and breath sounds normal.  Abdominal: Soft. Bowel sounds are normal.  Musculoskeletal: Normal range of motion.  Neurological: She is alert and oriented to person, place, and time.  Skin: Skin is warm and dry.  Psychiatric: She has a normal mood and affect. Her behavior is normal.  Nursing note and vitals reviewed.   ED Course  Procedures (including critical care time) Labs Review Labs Reviewed  CBC - Abnormal; Notable for the following:    Hemoglobin 11.4 (*)    All other components within normal limits  BASIC METABOLIC PANEL  CBG MONITORING, ED  I-STAT BETA HCG BLOOD, ED ([facility], [facility], [facility] ONLY)  Imaging Review No results found.   EKG Interpretation   Date/Time:  Thursday May 29 2014 11:19:37 EST Ventricular Rate:  93 PR Interval:  118 QRS Duration: 77 QT Interval:  343 QTC Calculation: 427 R Axis:   76 Text Interpretation:  Sinus rhythm Borderline short PR interval Confirmed  by Adriana SimasOOK  MD, Kenlie Seki ([id number]) on 05/29/2014 11:54:28 AM      MDM   Final diagnoses:  Syncope, unspecified syncope type  Hypoglycemia    [facility]. He recommended admission.  14:00  patient had  unprovoked episode of tachycardia rate 164 which spontaneously recovered in <5 min.    Donnetta HutchingBrian Savoy [place], MD 05/29/14 [phone]

## 2014-05-29 NOTE — ED Notes (Signed)
Notified RN of CBG 71 

## 2014-05-30 ENCOUNTER — Encounter (HOSPITAL_COMMUNITY): Payer: Self-pay | Admitting: *Deleted

## 2014-05-30 DIAGNOSIS — R55 Syncope and collapse: Secondary | ICD-10-CM | POA: Diagnosis not present

## 2014-05-30 DIAGNOSIS — Z79899 Other long term (current) drug therapy: Secondary | ICD-10-CM | POA: Diagnosis not present

## 2014-05-30 DIAGNOSIS — D649 Anemia, unspecified: Secondary | ICD-10-CM | POA: Diagnosis not present

## 2014-05-30 DIAGNOSIS — R000 Tachycardia, unspecified: Secondary | ICD-10-CM

## 2014-05-30 DIAGNOSIS — I456 Pre-excitation syndrome: Secondary | ICD-10-CM | POA: Diagnosis not present

## 2014-05-30 DIAGNOSIS — E162 Hypoglycemia, unspecified: Secondary | ICD-10-CM | POA: Diagnosis not present

## 2014-05-30 LAB — CBC
HCT: 33.9 % — ABNORMAL LOW (ref 36.0–46.0)
Hemoglobin: 10.5 g/dL — ABNORMAL LOW (ref 12.0–15.0)
MCH: 26.9 pg (ref 26.0–34.0)
MCHC: 31 g/dL (ref 30.0–36.0)
MCV: 86.7 fL (ref 78.0–100.0)
PLATELETS: 228 10*3/uL (ref 150–400)
RBC: 3.91 MIL/uL (ref 3.87–5.11)
RDW: 12.2 % (ref 11.5–15.5)
WBC: 6.8 10*3/uL (ref 4.0–10.5)

## 2014-05-30 LAB — GLUCOSE, CAPILLARY
GLUCOSE-CAPILLARY: 65 mg/dL — AB (ref 70–99)
Glucose-Capillary: 65 mg/dL — ABNORMAL LOW (ref 70–99)
Glucose-Capillary: 118 mg/dL — ABNORMAL HIGH (ref 70–99)
Glucose-Capillary: 130 mg/dL — ABNORMAL HIGH (ref 70–99)
Glucose-Capillary: 98 mg/dL (ref 70–99)
Glucose-Capillary: 80 mg/dL (ref 70–99)

## 2014-05-30 LAB — IRON AND TIBC
Iron: 42 ug/dL (ref 42–145)
Saturation Ratios: 10 % — ABNORMAL LOW (ref 20–55)
TIBC: 410 ug/dL (ref 250–470)
UIBC: 368 ug/dL (ref 125–400)

## 2014-05-30 LAB — TSH: TSH: 2.695 u[IU]/mL (ref 0.350–4.500)

## 2014-05-30 LAB — RETICULOCYTES
RBC.: 4.06 MIL/uL (ref 3.87–5.11)
RETIC COUNT ABSOLUTE: 32.5 10*3/uL (ref 19.0–186.0)
Retic Ct Pct: 0.8 % (ref 0.4–3.1)

## 2014-05-30 LAB — FERRITIN: FERRITIN: 11 ng/mL (ref 10–291)

## 2014-05-30 LAB — FOLATE: Folate: 19.9 ng/mL

## 2014-05-30 LAB — VITAMIN B12: Vitamin B-12: 868 pg/mL (ref 211–911)

## 2014-05-30 MED ORDER — TRAMADOL HCL 50 MG PO TABS
50.0000 mg | ORAL_TABLET | Freq: Four times a day (QID) | ORAL | Status: DC | PRN
  Administered 2014-05-30 – 2014-05-31 (×3): 50 mg via ORAL
  Filled 2014-05-30 (×3): qty 1

## 2014-05-30 MED ORDER — POLYETHYLENE GLYCOL 3350 17 G PO PACK
17.0000 g | PACK | Freq: Every day | ORAL | Status: DC | PRN
  Administered 2014-06-02: 17 g via ORAL
  Filled 2014-05-30: qty 1

## 2014-05-30 MED ORDER — FLUDROCORTISONE ACETATE 0.1 MG PO TABS
0.1000 mg | ORAL_TABLET | Freq: Every day | ORAL | Status: DC
  Administered 2014-05-30 – 2014-06-02 (×4): 0.1 mg via ORAL
  Filled 2014-05-30 (×4): qty 1

## 2014-05-30 MED ORDER — ACETAMINOPHEN 325 MG PO TABS
650.0000 mg | ORAL_TABLET | Freq: Four times a day (QID) | ORAL | Status: DC | PRN
  Administered 2014-05-30 (×2): 650 mg via ORAL
  Filled 2014-05-30 (×2): qty 2

## 2014-05-30 MED ORDER — SODIUM CHLORIDE 0.9 % IV SOLN
INTRAVENOUS | Status: DC
  Administered 2014-05-30: 17:00:00 via INTRAVENOUS

## 2014-05-30 MED ORDER — SODIUM CHLORIDE 0.9 % IV SOLN
Freq: Once | INTRAVENOUS | Status: AC
  Administered 2014-05-30: 13:00:00 via INTRAVENOUS

## 2014-05-30 MED ORDER — DOCUSATE SODIUM 100 MG PO CAPS
100.0000 mg | ORAL_CAPSULE | Freq: Every day | ORAL | Status: DC | PRN
  Administered 2014-05-31 – 2014-06-01 (×2): 100 mg via ORAL
  Filled 2014-05-30 (×2): qty 1

## 2014-05-30 NOTE — Progress Notes (Signed)
Subjective: Desiree Gutierrez had an episode of tachycardia up to the 140s with loss of consciousness for a few minutes this afternoon. It was captured on EKG. Cardiology was notified as they are to consult and instructed to give 500 cc NS bolus. CBG 65 at that time.  I interviewed her shortly after this episode and she said it was typical of her syncope and the same sensation she had when at the bank yesterday. She said she now feels back to her baseline with no chest pain, SOB, palpitations.   Objective: Vital signs in last 24 hours: Filed Vitals:   05/29/14 2004 05/30/14 0031 05/30/14 0545 05/30/14 0835  BP: 99/62 100/61 97/51 105/69  Pulse: 101 87 91 88  Temp: 98.6 F (37 C) 98.2 F (36.8 C) 98.5 F (36.9 C) 97.5 F (36.4 C)  TempSrc: Oral Oral Oral Oral  Resp: Height:      Weight:   131 lb (59.421 kg)   SpO2: 97% 99% 98% 100%   Weight change:   Intake/Output Summary (Last 24 hours) at 05/30/14 1303 Last data filed at 05/30/14 1135  Gross per 24 hour  Intake    480 ml  Output   1450 ml  Net   -970 ml   Gen: A&O x 4, no acute distress, well developed, well nourished HEENT: Atraumatic, PERRL, EOMI, sclerae anicteric, moist mucous membranes Heart: Regular rate and rhythm, normal S1 S2, no murmurs, rubs, or gallops Lungs: Clear to auscultation bilaterally, respirations unlabored Abd: Soft, non-tender, non-distended, + bowel sounds, no hepatosplenomegaly Ext: No edema or cyanosis  Lab Results: Basic Metabolic Panel:  Recent Labs Lab 05/25/14 1512 05/29/14 1127  NA 139 136  K 4.3 3.9  CL 105 104  CO2 30 27  GLUCOSE 88 83  BUN 9 9  CREATININE 0.70 0.74  CALCIUM 9.7 9.5   CBC:  Recent Labs Lab 05/25/14 1512 05/29/14 1127 05/30/14 0327  WBC 5.1 4.9 6.8  NEUTROABS 2.4  --   --   HGB 10.1* 11.4* 10.5*  HCT 31.7* 36.3 33.9*  MCV 86.1 88.5 86.7  PLT 200 209 228   CBG:  Recent Labs Lab 05/29/14 1612 05/29/14 1808 05/29/14 1844 05/29/14 2105  05/30/14 0833 05/30/14 1151  GLUCAP 84 65* 105* 87 118* 65*   Thyroid Function Tests:  Recent Labs Lab 05/30/14 0844  TSH 2.695   Anemia Panel:  Recent Labs Lab 05/30/14 0844  RETICCTPCT 0.8   Urine Drug Screen: Drugs of Abuse     Component Value Date/Time   LABOPIA NONE DETECTED 01/21/2014 1234   COCAINSCRNUR NONE DETECTED 01/21/2014 1234   LABBENZ NONE DETECTED 01/21/2014 1234   AMPHETMU NONE DETECTED 01/21/2014 1234   THCU NONE DETECTED 01/21/2014 1234   LABBARB NONE DETECTED 01/21/2014 1234  Urinalysis:  Recent Labs Lab 05/25/14 1512  COLORURINE STRAW*  LABSPEC 1.005  PHURINE 7.0  GLUCOSEU NEGATIVE  HGBUR NEGATIVE  BILIRUBINUR NEGATIVE  KETONESUR NEGATIVE  PROTEINUR NEGATIVE  UROBILINOGEN 0.2  NITRITE NEGATIVE  LEUKOCYTESUR NEGATIVE    Micro Results: Recent Results (from the past 240 hour(s))  MRSA PCR Screening     Status: None   Collection Time: 05/29/14  5:06 PM  Result Value Ref Range Status   MRSA by PCR NEGATIVE NEGATIVE Final    Comment:        The GeneXpert MRSA Assay (FDA approved for NASAL specimens only), is one component of a comprehensive MRSA colonization surveillance program. It is not  intended to diagnose MRSA infection nor to guide or monitor treatment for MRSA infections.    Studies/Results: No results found. Medications: I have reviewed the patient's current medications. Scheduled Meds: . heparin  5,000 Units Subcutaneous 3 times per day  . sodium chloride  3 mL Intravenous Q12H   Continuous Infusions:  PRN Meds:.acetaminophen, docusate sodium, polyethylene glycol, traMADol Assessment/Plan: Principal Problem:   Syncope Active Problems:   WOLFF-PARKINSON-WHITE (WPW) SYNDROME status post ablation   Tachycardia  #Syncope 2/2 Wolf-Parkinson-White: Desiree Gutierrez has known Wolf-Parkinson-White. She is s/p ablation in 2011 by Dr [facility]elyn [facility]erman [facility] and currently [facility]. She reported  today that she has been told that she has two re-entry paths and only one was ablation. She says that this episode today as well as her episode yesterday are typical for her condition. Her loop recorder was implanted in 01/2014 and was interrogated. This report appears notable for recorded episodes of tachycardia reportedly in the 270s that lasted 1 and 4 minutes on 05/15/14. Of note, she was hypoglycemic at 65 during her episode today -f/u cardiology/electrophysiology consult -cardiac monitoring  #Postprandial hypoglycemia: Desiree Gutierrez CBG have been normal here until her episode of syncope this afternoon before she ate lunch when it was noted to be 65. EMS reports CBG 65 in field yesterday. She says she checks her sugar at home using her mom's meter and it can be in the 50s-60s a few hours after eating. This was shared with Dr Cristi LoronBeatty who recommended she get a PCP to further evaluate. -POC CBG tidac and hs -consider sulfonylurea and c-peptide  #Anemia: Presenting hemoglobin of 11.4 was 10.1 on 2/21 ED presentation. Today 10.5 Baseline is around 11.5. Last anemia panel 10/2010 with iron 47, TIBC 377, ferritin 13. -anemia panel pending  Dispo: Disposition is deferred at this time, awaiting improvement of current medical problems.  Anticipated discharge in approximately 1-2 day(s).   The patient does not have a current PCP (No Pcp Per Patient) and [facility].  The patient does not have transportation limitations that hinder transportation to clinic appointments.  .Services Needed at time of discharge: Y = Yes, Blank = No PT:   OT:   RN:   Equipment:   Other:       Lorenda HatchetAdam L Lavender Stanke, MD 05/30/2014, 1:03 PM

## 2014-05-30 NOTE — Progress Notes (Signed)
  Subjective: Desiree Gutierrez reports not having any more syncopal episodes over night.  She states she has a really bad headache from where she hit her head.  She has been able to eat and drink with no problems.    Objective: Vital signs in last 24 hours: Filed Vitals:   05/29/14 2004 05/30/14 0031 05/30/14 0545 05/30/14 0835  BP: 99/62 100/61 97/51 105/69  Pulse: 101 87 91 88  Temp: 98.6 F (37 C) 98.2 F (36.8 C) 98.5 F (36.9 C) 97.5 F (36.4 C)  TempSrc: Oral Oral Oral Oral  Resp: 18 18 18 18   Height:      Weight:   59.421 kg (131 lb)   SpO2: 97% 99% 98% 100%    Intake/Output Summary (Last 24 hours) at 05/30/14 1114 Last data filed at 05/30/14 0900  Gross per 24 hour  Intake    480 ml  Output   1100 ml  Net   -620 ml   General appearance: alert and sitting up and bed, reading on tablet.   Head: Normocephalic, without obvious abnormality, atraumatic, tender to palpation over forehead Eyes: conjunctivae/corneas clear. PERRL, EOM's intact. Lungs: clear to auscultation bilaterally Heart: regular rate and rhythm, S1, S2 normal, no murmur, click, rub or gallop Neurologic: Grossly normal  Lab Results:   Ref. Range 05/30/2014 03:27  WBC Latest Range: 4.0-10.5 K/uL 6.8  RBC Latest Range: 3.87-5.11 MIL/uL 3.91  Hemoglobin Latest Range: 12.0-15.0 g/dL 16.110.5 (L)  HCT Latest Range: 36.0-46.0 % 33.9 (L)  MCV Latest Range: 78.0-100.0 fL 86.7  MCH Latest Range: 26.0-34.0 pg 26.9  MCHC Latest Range: 30.0-36.0 g/dL 09.631.0  RDW Latest Range: 11.5-15.5 % 12.2  Platelets Latest Range: 150-400 K/uL 228   TSH:  2.695  Medications: I have reviewed the patient's current medications. Scheduled Meds: . heparin  5,000 Units Subcutaneous 3 times per day  . sodium chloride  3 mL Intravenous Q12H   PRN Meds:.acetaminophen  Assessment/Plan: #WPW syndrome/Postural Orthostatic Tachycardia Syndrome S/p ablation in 2011. Pt wearing loop monitor and is followed closely by her cardiologist [facility].  Syncope likely a cardiogenic source.  Pt reports, per heart surgeon, that she has a "dual pathway."  It appears the ablation did not fully resolve the arrhythmia, will wait for cardiology's consult and recommendations.   -Cardio EP consult  #Post prandial Hypoglycemia Possibly post prandial hypoglycemia, pt has reported low glucose readings but no reports of symptoms such as tremulousness and palpitations. Glucose has been ranging between 105-118 with one reading at 65.  -serial post prandial glucose checks at 1hr and 2hr -check insulin labs if glucose levels remain low   This is a Psychologist, occupationalMedical Student Note.  The care of the patient was discussed with Dr. Meredith PelJoines and the assessment and plan formulated with their assistance.  Please see their attached note for official documentation of the daily encounter.     Sonda RumbleKeyonna M Luv Mish, Med Student 05/30/2014, 11:14 AM

## 2014-05-30 NOTE — Progress Notes (Signed)
Internal Medicine Attending  Date: 05/30/2014  Patient name: Desiree BassetDemetri Camire Medical record number: [id number] Date of birth: 09/10/1990 Age: 24 y.o. Gender: female  I saw and evaluated the patient. I discussed patient and reviewed the resident's note by Dr. Valentino Saxonothman, and I agree with the resident's findings and plans as documented in his note, with the following additional comments.  Patient had a brief episode of loss of consciousness while lying in bed; telemetry then showed a narrow complex tachycardia with a rate in the 140s.  Cardiology/EP consult has been requested and will see patient today.  She has some soreness and induration of her forehead secondary to her fall yesterday; would follow clinical status closely and if she develops any guideline indications for head CT, would obtain noncontrast head CT.

## 2014-05-30 NOTE — Consult Note (Signed)
ELECTROPHYSIOLOGY CONSULT NOTE    Patient ID: Desiree Gutierrez [place] MRN: [id number], DOB/AGE: 24/09/1990 23 y.o.  Admit date: 05/29/2014 Date of Consult: 05/30/2014  Primary Physician: No PCP Per Patient Primary Electrophysiologist: Alfonso EllisBeaty [facility]  Reason for Consultation: syncope  HPI:  Desiree Gutierrez Kaatz is a 24 y.o. female with a past medical history significant for WPW(s/p left posterior pathway ablation in 2011 by Dr Gevena CottonZimmern), vasovagal syncope with prior positive tilt, POTS, and palpitations (s/p ILR implant [facility] due to previous normal external monitors).   According [facility] Rexene [facility]Beat[facility], she has failed medical therapy for vasovagal syncope/POTS with florinef, midodrine, inderal, and citalapram.  She has had multiple ER visits for recurrent syncope.  She presented on 05-29-14 with a typical syncopal spell while standing in line at the bank.  She presented to the ER for further evaluation. She has been admitted by medicine and EP has been asked to evaluate for treatment options.   She had another episode of syncope today on telemetry which demonstrated ST with rates into the 140's.  CBG at the time was 65.  She was sitting up at the time of the episode.  She states that she has seen [facility] Erenest Rasheramille [facility].  She was intolerant of Midodrine due to migraines.  She does not remember response to Florinef (looking at notes, it was tried in 2013).  She states that she was taking salt tablets but has run out. She is drinking water and gatorade and her urine is clear.  She does not use support hose (Dr Alfonso EllisBeaty recommended waist high).    She feels like she needs to come to the ER because of syncope and the fact that she has little prodrome and usually falls with her episodes.   ILR interrogation demonstrated 3 symptom episodes and 2 tachy episodes.  While the tachy episodes read as a cycle length of 270msec, this is double counting the QRS complex and true  ventricular rate is around 600msec.  Symptom episodes that have been marked only correlate to sinus tach.    EPS at time of WPW ablation in 2011 demonstrated dual AV nodal physiology, but the patient has not had documented SVT.    Echocardiogram 12/14 demonstrated normal EF, normal RV and LV function.   Lab work this admission is reviewed. Orthostatics this admission are normal.   She currently denies chest pain, shortness of breath, dizziness, palpitations.  She states that her syncopal spells have a short prodrome and she is unable to lie down quickly enough to avoid passing out.   Past Medical History  Diagnosis Date  . WPW (Wolff-Parkinson-White syndrome)     a. s/p ablation (2011) [facility] (posterior lateral pathway)  . Vasovagal syncope     a. positive tilt 2011 b. failed medical therapy with Florinef, Midodrine, Inderal, Celexa  . POTS (postural orthostatic tachycardia syndrome)   . Palpitations     a. s/p MDT ILR implanted by Dr Alfonso EllisBeaty [facility]     Surgical History:  Past Surgical History  Procedure Laterality Date  . Cardiac electrophysiology study and ablation  2011    [facility]- left posterior pathway ablation for WPW  . Tilt table study  2011    neurally mediated syncope  . Loop recorder implant  01/22/2014    MDT LINQ implanted by [facility]Beaty [facility] for evaluation of palpitations/syncope     Prescriptions prior to admission  Medication Sig  Dispense Refill Last Dose  . Multiple Vitamin (MULTIVITAMIN WITH MINERALS) TABS tablet Take 1 tablet by mouth daily.   05/29/2014 at Unknown time  . traMADol (ULTRAM) 50 MG tablet Take 1 tablet (50 mg total) by mouth every 6 (six) hours as needed. (Patient not taking: Reported on 03/12/2014) 15 tablet 0 Completed Course at Unknown time    Inpatient Medications:  . heparin  5,000 Units Subcutaneous 3 times per day  . sodium chloride  3 mL Intravenous Q12H    Allergies:  Allergies  Allergen Reactions  .  Amitiza [Lubiprostone] Anaphylaxis, Shortness Of Breath and Swelling  . Ketorolac Tromethamine Other (See Comments)    Heart raced  . Metoclopramide Other (See Comments)    Heart raced  . Midodrine Other (See Comments)    migraines    History   Social History  . Marital Status: Single    Spouse Name: N/A  . Number of Children: N/A  . Years of Education: N/A   Occupational History  . Not on file.   Social History Main Topics  . Smoking status: Never Smoker   . Smokeless tobacco: Not on file  . Alcohol Use: No  . Drug Use: No  . Sexual Activity: Not on file   Other Topics Concern  . Not on file   Social History Narrative     Family History  Problem Relation Age of Onset  . Diabetes Mother   . Hypertension Mother   . Stroke Mother   . Hypertension Brother      Physical Exam: Filed Vitals:   05/29/14 2004 05/30/14 0031 05/30/14 0545 05/30/14 0835  BP: 99/62 100/61 97/51 105/69  Pulse: 101 87 91 88  Temp: 98.6 F (37 C) 98.2 F (36.8 C) 98.5 F (36.9 C) 97.5 F (36.4 C)  TempSrc: Oral Oral Oral Oral  Resp: Height:      Weight:   131 lb (59.421 kg)   SpO2: 97% 99% 98% 100%    GEN- The patient is well appearing, alert and oriented x 3 today.   Head- normocephalic, atraumatic Eyes-  Sclera clear, conjunctiva pink Ears- hearing intact Oropharynx- clear Neck- supple, Lungs- Clear to ausculation bilaterally, normal work of breathing Heart- Regular rate and rhythm, no murmurs, rubs or gallops  GI- soft, NT, ND, + BS Extremities- no clubbing, cyanosis, or edema  MS- no significant deformity or atrophy Skin- no rash or lesion Psych- euthymic mood, full affect Neuro- strength and sensation are intact  Labs:   Lab Results  Component Value Date   WBC 6.8 05/30/2014   HGB 10.5* 05/30/2014   HCT 33.9* 05/30/2014   MCV 86.7 05/30/2014   PLT 228 05/30/2014     Recent Labs Lab 05/29/14 1127  NA 136  K 3.9  CL 104  CO2 27  BUN 9    CREATININE 0.74  CALCIUM 9.5  GLUCOSE 83    WJX:BJYNW rhythm, rate 93  TELEMETRY: sinus rhythm with sinus tachycardia  A/P: 1. Dysautomonia/vasovagal syncope/POTS Patient is willing to try Florinef again to assess response to symptoms  Encouraged adequate PO hydration and increased salt intake ILR interrogation demonstrates sinus tachycardia with no SVT noted Waist high support hose Follow-up with Dr Beaty/Frazier-Mills as scheduled No driving X6 months from last syncopal event  EP to see as needed while here. Please call with questions.   Gypsy Balsam, NP 05/30/2014 4:13 PM  I have seen, examined the patient, and reviewed the above assessment  and plan with Gypsy Balsam NP.  Changes to above are made where necessary.  She has had recurrent syncope for which she has been evaluated extensively by multiple EPs.  She has been observed to have only sinus tachycardia on her ILR and telemetry.  This is consistent with her dysautonomia.  IVF overnight.  Add florinef. Follow-up with Drs Alfonso Ellis and Read Drivers.    Electrophysiology team to see as needed while here.  OK to discharge at this time. Please call with questions.   Co Sign: Hillis Range, MD 05/30/2014 6:11 PM

## 2014-05-30 NOTE — Progress Notes (Signed)
UR completed 

## 2014-05-31 ENCOUNTER — Observation Stay (HOSPITAL_COMMUNITY): Payer: Medicaid Other

## 2014-05-31 DIAGNOSIS — D649 Anemia, unspecified: Secondary | ICD-10-CM | POA: Diagnosis not present

## 2014-05-31 DIAGNOSIS — R55 Syncope and collapse: Secondary | ICD-10-CM | POA: Diagnosis not present

## 2014-05-31 DIAGNOSIS — E162 Hypoglycemia, unspecified: Secondary | ICD-10-CM | POA: Diagnosis not present

## 2014-05-31 DIAGNOSIS — R51 Headache: Secondary | ICD-10-CM | POA: Diagnosis not present

## 2014-05-31 LAB — CBC
HCT: 31.8 % — ABNORMAL LOW (ref 36.0–46.0)
Hemoglobin: 10 g/dL — ABNORMAL LOW (ref 12.0–15.0)
MCH: 28 pg (ref 26.0–34.0)
MCHC: 31.4 g/dL (ref 30.0–36.0)
MCV: 89.1 fL (ref 78.0–100.0)
Platelets: 194 10*3/uL (ref 150–400)
RBC: 3.57 MIL/uL — ABNORMAL LOW (ref 3.87–5.11)
RDW: 12.2 % (ref 11.5–15.5)
WBC: 6 10*3/uL (ref 4.0–10.5)

## 2014-05-31 LAB — GLUCOSE, CAPILLARY
GLUCOSE-CAPILLARY: 83 mg/dL (ref 70–99)
GLUCOSE-CAPILLARY: 66 mg/dL — AB (ref 70–99)
GLUCOSE-CAPILLARY: 119 mg/dL — AB (ref 70–99)
Glucose-Capillary: 64 mg/dL — ABNORMAL LOW (ref 70–99)
Glucose-Capillary: 79 mg/dL (ref 70–99)
Glucose-Capillary: 72 mg/dL (ref 70–99)
Glucose-Capillary: 106 mg/dL — ABNORMAL HIGH (ref 70–99)

## 2014-05-31 MED ORDER — ACETAMINOPHEN 325 MG PO TABS
650.0000 mg | ORAL_TABLET | Freq: Once | ORAL | Status: AC
  Administered 2014-05-31: 650 mg via ORAL
  Filled 2014-05-31: qty 2

## 2014-05-31 MED ORDER — DIPHENHYDRAMINE HCL 25 MG PO CAPS
25.0000 mg | ORAL_CAPSULE | Freq: Three times a day (TID) | ORAL | Status: DC | PRN
  Administered 2014-05-31: 25 mg via ORAL
  Filled 2014-05-31 (×2): qty 1

## 2014-05-31 MED ORDER — ONDANSETRON 4 MG PO TBDP
4.0000 mg | ORAL_TABLET | Freq: Once | ORAL | Status: AC
  Administered 2014-05-31: 4 mg via ORAL
  Filled 2014-05-31: qty 1

## 2014-05-31 MED ORDER — HYDROCODONE-ACETAMINOPHEN 5-325 MG PO TABS
1.0000 | ORAL_TABLET | ORAL | Status: DC | PRN
  Administered 2014-05-31: 1 via ORAL
  Filled 2014-05-31: qty 1

## 2014-05-31 MED ORDER — SODIUM CHLORIDE 0.9 % IV SOLN
Freq: Once | INTRAVENOUS | Status: AC
  Administered 2014-05-31: 08:00:00 via INTRAVENOUS

## 2014-05-31 MED ORDER — SODIUM CHLORIDE 0.9 % IV SOLN: INTRAVENOUS | Status: AC

## 2014-05-31 MED ORDER — TRAMADOL HCL 50 MG PO TABS
50.0000 mg | ORAL_TABLET | Freq: Once | ORAL | Status: AC
  Administered 2014-05-31: 50 mg via ORAL
  Filled 2014-05-31: qty 1

## 2014-05-31 NOTE — Progress Notes (Signed)
Subjective: Pt reports having a "rough" night and morning.  She continues with persistent, intense headache that has prevented her from sleep.  Pain is currently 5/10 after pain medication this morning.  The Zofran helped with the nausea.  Pt is concerned about the next steps in her treatment.  She mentioned concern about her loop recorder working correctly and how she should proceed from this point.  Her follow up appointments are schedule at later dates but she's worried about what she should do in between the admission and follow up appointments.      Objective: Vital signs in last 24 hours: Filed Vitals:   05/30/14 2007 05/31/14 0400 05/31/14 0736 05/31/14 0913  BP: 109/63 107/64 118/72   Pulse: 78     Temp: 98.1 F (36.7 C) 97.4 F (36.3 C)  98 F (36.7 C)  TempSrc: Oral Oral  Oral  Resp: 16 16    Height:      Weight:      SpO2: 100% 100%      Intake/Output Summary (Last 24 hours) at 05/31/14 0939 Last data filed at 05/31/14 0900  Gross per 24 hour  Intake 1847.5 ml  Output   2600 ml  Net -752.5 ml   Physical Exam Exam unchanged from previous    Lab Results: CBC    Component Value Date/Time   WBC 6.0 05/31/2014 0430   RBC 3.57* 05/31/2014 0430   RBC 4.06 05/30/2014 0844   HGB 10.0* 05/31/2014 0430   HCT 31.8* 05/31/2014 0430   PLT 194 05/31/2014 0430   MCV 89.1 05/31/2014 0430   MCH 28.0 05/31/2014 0430   MCHC 31.4 05/31/2014 0430   RDW 12.2 05/31/2014 0430   LYMPHSABS 2.3 05/25/2014 1512   MONOABS 0.3 05/25/2014 1512   EOSABS 0.0 05/25/2014 1512   BASOSABS 0.0 05/25/2014 1512    Studies/Results: Ct Head Wo Contrast  05/31/2014   CLINICAL DATA:  Syncopal episodes and blurry vision  EXAM: CT HEAD WITHOUT CONTRAST  TECHNIQUE: Contiguous axial images were obtained from the base of the skull through the vertex without intravenous contrast.  COMPARISON:  03/12/14  FINDINGS: The bony calvarium is intact. The ventricles are of normal size and configuration. No  findings to suggest acute hemorrhage, acute infarction or space-occupying mass lesion are noted.  IMPRESSION: No acute intracranial abnormality noted.   Electronically Signed   By: Alcide Clever M.D.   On: 05/31/2014 08:19   Medications: I have reviewed the patient's current medications. Scheduled Meds: . fludrocortisone  0.1 mg Oral Daily  . heparin  5,000 Units Subcutaneous 3 times per day  . sodium chloride  3 mL Intravenous Q12H   Continuous Infusions: . sodium chloride 75 mL/hr at 05/30/14 1710   PRN Meds:.docusate sodium, HYDROcodone-acetaminophen, polyethylene glycol, traMADol  Assessment/Plan: #WOLFF-PARKINSON-WHITE (WPW) SYNDROME status post ablation S/p ablation in 2011. Pt wearing loop monitor and is followed closely by her cardiologist Dr. Cristi Loron of [facility].  Continues with episodes of tachycardia and low glucose levels.  EP states that there is dual AV nodal physiology but that pt has no documented SVT. -Consult pt's regular cardiologist -Encourage compliance with EP's recommendations -Florinef       #Post prandial Hypoglycemia Possibly post prandial hypoglycemia, pt has reported low glucose readings but no reports of symptoms such as tremulousness and palpitations.  She does have a persistent headache and dizziness with moving, which could be due to autonomic dysfunction and low glucose levels are exacerbating these symptoms.  Pt  glucose continues to range between 66-79 after eating complete meals.   -Consider measuring insulin levels pre and post meals    #Postural Orthostatic Tachycardia Syndrome/Dysautomonia Per EP consult, pt's syncopal episodes are most likely due to POTS.  The tachycardia is in response to her autonomic dysfunction.    Recommendations for the pt: -salt tablets -increased hydration -waist high support hoses -no driving x6 months from last syncopal event   This is a Psychologist, occupationalMedical Student Note.  The care of the patient was discussed with  Dr. Josem KaufmannKlima and the assessment and plan formulated with their assistance.  Please see their attached note for official documentation of the daily encounter.  Sonda RumbleKeyonna M Aleighna Wojtas, Med Student 05/31/2014, 9:39 AM

## 2014-05-31 NOTE — Progress Notes (Signed)
UR completed 

## 2014-05-31 NOTE — Progress Notes (Signed)
After 3 unsucessful IV attempts. Pt has agreed to consume oral liquids. Will continue with NSL.

## 2014-05-31 NOTE — Progress Notes (Signed)
Subjective: Desiree Gutierrez was evaluated early this morning by the NF intern for persistent headache.  She was given extra pain medication and a non-contrast CT head was ordered for further evaluation.  This morning after returning to her room from CT she experienced another syncopal episode.  The RN today was the same RN present during her episode yesterday.  She reports today's episode was of shorter duration (~ 1 minute) and the patient quickly became responsive.  Telemetry captured HR 143 during the event.  BP  127/78 and CBG 66.  Desiree Gutierrez was seen and examined shortly after this morning's events.  She is alert and oriented, in no distress and able to communicate without difficulty.  She is concerned that her family members have not visited her in hospital despite living locally and knowing that she has been admitted.   Objective: Vital signs in last 24 hours: Filed Vitals:   05/30/14 1539 05/30/14 2007 05/31/14 0400 05/31/14 0736  BP: 110/64 109/63 107/64 118/72  Pulse: 81 78    Temp: 98.3 F (36.8 C) 98.1 F (36.7 C) 97.4 F (36.3 C)   TempSrc: Oral Oral Oral   Resp: Height:      Weight:      SpO2: 100% 100% 100%    Weight change:   Intake/Output Summary (Last 24 hours) at 05/31/14 0845 Last data filed at 05/31/14 4132  Gross per 24 hour  Intake 1847.5 ml  Output   2700 ml  Net -852.5 ml   Gen: AAO x 3, no acute distress, well developed, well nourished, supine in bed HEENT: [place]/AT, EOMI, MMM Heart: Regular rate and rhythm, normal S1 S2, no murmurs, rubs, or gallops Lungs: Clear to auscultation bilaterally, respirations unlabored Abd: Soft, non-tender, non-distended, + bowel sounds Ext: no edema, extremities warm and well perfused Neuro:  AAO x3, strength and sensation are equal and intact upper and lower extremities, she is following commands, able to move extremities voluntarily, responding appropriately Psych:  Mood and affect appropriate, appropriate  interaction  Lab Results:  CBC:  Recent Labs Lab 05/25/14 1512  05/30/14 0327 05/31/14 0430  WBC 5.1  < > 6.8 6.0  NEUTROABS 2.4  --   --   --   HGB 10.1*  < > 10.5* 10.0*  HCT 31.7*  < > 33.9* 31.8*  MCV 86.1  < > 86.7 89.1  PLT 200  < > 228 194  < > = values in this interval not displayed. CBG:  Recent Labs Lab 05/30/14 1329 05/30/14 1825 05/30/14 2222 05/31/14 0608 05/31/14 0642 05/31/14 0800  GLUCAP 130* 98 80 64* 83 66*   Studies/Results: Ct Head Wo Contrast  05/31/2014   CLINICAL DATA:  Syncopal episodes and blurry vision  EXAM: CT HEAD WITHOUT CONTRAST  TECHNIQUE: Contiguous axial images were obtained from the base of the skull through the vertex without intravenous contrast.  COMPARISON:  03/12/14  FINDINGS: The bony calvarium is intact. The ventricles are of normal size and configuration. No findings to suggest acute hemorrhage, acute infarction or space-occupying mass lesion are noted.  IMPRESSION: No acute intracranial abnormality noted.   Electronically Signed   By: Alcide Clever M.D.   On: 05/31/2014 08:19   Medications: I have reviewed the patient's current medications. Scheduled Meds: . fludrocortisone  0.1 mg Oral Daily  . heparin  5,000 Units Subcutaneous 3 times per day  . sodium chloride  3 mL Intravenous Q12H   Continuous Infusions: . 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  Syncopal episode in patient with hx of POTS:  Initial concern for cardiogenic syncope given the patient's history of WPW.  The patient also has had a prior tilt study c/w neurocardiogenic (vasovagal) syncope.  She has had no SVT/arhythmias on telemetry.  LINQ interrogated and EP physician explained that the episode of HR 207 captured by LINQ was  actually due to the device double counting so the actual HR was closer to 103-104.  EP has evaluated her and feel the syncope is due to her POTS.   I appreciate EP's evaluation and recommendations. - Florinef was started on 02/26 - encourage po hydration and liberalized salt intake (she reports good po) - 500cc NS bolus after this AM's event; continue maintenance through the morning - waist high support hose - patient to follow-up with Dr. Alfonso EllisBeaty (will arrange close follow-up) - The patient is not currently driving and she was advised that she must continue to not drive until cleared by her physician; cardiology recommends 6 months driving restriction from time of last syncopal event.  Wolf-Parkinson-White s/p ablation:  The patient reports syncopal episodes since her late teens.  She was ablated in 2011 and continued to have these events.  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  She currently follows [facility] and she has a LINQ that was placed in 02/2014.  As above, I spoke with EP physician and after evaluating the patient and reviewing LINQ and history, he feels the syncopal events are due to POTS and not WPW. -patient to follow-up with Dr. Alfonso EllisBeaty (will arrange close follow-up) -cardiac monitoring  Postprandial hypoglycemia:  She has no hx of DM but checks her CBGs at home on her mom's meter.  She reports CBGs 50-60 two hours after eating.  She says her mother is on insulin but no oral anti-DM meds.  CBGs have varied this admission but she has had a few postprandial lows around 65.  She has had no values below 60, she has not required D50. -continue to monitor CBGs after meals, qHS and when symptomatic -consider sulfonylurea panel, C-peptide if glucose < 60 -patient [facility] to further investigate these symptoms  Headache:  Likely soft tissue related to  landing on floor (hit floor with her forehead during syncopal event).  No focal deficits on exam.  CT obtained by my colleague reveals no acute abnormalities.   - Norco prn started this AM - d/c Tramadol - will avoid NSAIDs given her allergy to Ketorolac  Anemia: Hgb 11.4 --> 10.0 this admission.  There is likely dilution due to fluid administration.  Anemia panel Fe 42, Ferritin 11, sat ratio 10%, TIBC 410 c/w Fe deficiency with mild anemia.   She denies prior anemia dx.  Not currently menstruating but last menses was heavier.  -consider initiating Fe supplement   VTE ppx: [place] heparin Diet:  Regular Code:  Full   Dispo: Disposition is deferred at this time, awaiting improvement of current medical problems.  Anticipated discharge in approximately 0-1 day(s).   The patient does not have a current PCP (No Pcp Per Patient) and  does need an [facility] hospital follow-up appointment after discharge.  The patient does not have transportation limitations that hinder transportation to clinic appointments.  .Services Needed at time of discharge: Y = Yes, Blank = No PT:   OT:   RN:   Equipment:   Other:       Yolanda Manges, DO 05/31/2014, 8:45 AM

## 2014-05-31 NOTE — Progress Notes (Signed)
Went to see patient after RN called about patient's persistent severe headache. She has pain since [place] and [place] her [place]. Headache is mainly frontal, throbbing in quality. Tylenol does not help at all. Tramadol helps somewhat (brings the pain from 9/10 to 5/10) but it comes back again in 1-2 hours. Right now the pain is 7/10. i ordered extra dose of tramadol earlier given around 3 am today, in addition to her q6hr PRN tramadol which she got at 0052. Has some nausea as well but no other symptoms currently. No weakness/numbness/tingling. No visual changes.   Exam:  Neuro: a&ox3. CN II-XII grossly intact. Moving all exts equally. Sensation fully intact. Heent: PERRLA, eomi, no racoon's eye, no Battle's sign, no Hemotympanum. No open skull fx. Cards: rrr, no m/r/g Lungs: ctab. Filed Vitals:   05/31/14 0400  BP: 107/64  Pulse:   Temp: 97.4 F (36.3 C)  Resp: 16     A/p  24 yo with hx of WPW presents after a syncope, with injury to her head, having persistent headache.  Headache - did not get any Head CT yet after the fall. Will order non-con head CT to rule out any hemorrhage. Low likely hood to be positive given no there changes on exam. However, given persistent headache we will obtain one. - tramadol is already q6hr. Max is 400mg  daily. Will add norco to cover in between.

## 2014-05-31 NOTE — Progress Notes (Signed)
Pt back from CT scan. Nurses at bedside. Pt not responding. Pulse went up to 122-131 at the time of episode. Pt responded to RN opening her eyes. Pt is now alert in bed resting. Episode lasted approximately 1 minute RN instructed pt not to exit bed without assistance. Call bell within reach. Pt BP 127/78, CBG 66.

## 2014-05-31 NOTE — Progress Notes (Addendum)
Pt had another syncopal episode, when getting back in bed from toileting. 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 Pt Blood pressure 97/62, CBG 106. HR reached 122 according telemetry monitors. MD paged, awaiting further orders. Cecille Rubinhompson,Jevaun Strick V, RN

## 2014-05-31 NOTE — Progress Notes (Signed)
Hypoglycemic Event  CBG: 64   Treatment: 15 GM carbohydrate snack  Symptoms: Sweaty, nauseous   Follow-up CBG: Time: 630 CBG Result:84  Possible Reasons for Event: Unknown  Comments/MD notified: yes, and zofran ordered too     Annye AsaYoo, Jaryd Drew A  Remember to initiate Hypoglycemia Order Set & complete

## 2014-06-01 DIAGNOSIS — G90A Postural orthostatic tachycardia syndrome (POTS): Secondary | ICD-10-CM

## 2014-06-01 DIAGNOSIS — D509 Iron deficiency anemia, unspecified: Secondary | ICD-10-CM

## 2014-06-01 DIAGNOSIS — R51 Headache: Secondary | ICD-10-CM

## 2014-06-01 DIAGNOSIS — R55 Syncope and collapse: Secondary | ICD-10-CM | POA: Diagnosis not present

## 2014-06-01 DIAGNOSIS — I951 Orthostatic hypotension: Secondary | ICD-10-CM

## 2014-06-01 DIAGNOSIS — E162 Hypoglycemia, unspecified: Secondary | ICD-10-CM | POA: Diagnosis not present

## 2014-06-01 DIAGNOSIS — R000 Tachycardia, unspecified: Secondary | ICD-10-CM

## 2014-06-01 DIAGNOSIS — R519 Headache, unspecified: Secondary | ICD-10-CM

## 2014-06-01 DIAGNOSIS — D649 Anemia, unspecified: Secondary | ICD-10-CM | POA: Diagnosis not present

## 2014-06-01 LAB — GLUCOSE, CAPILLARY
GLUCOSE-CAPILLARY: 112 mg/dL — AB (ref 70–99)
Glucose-Capillary: 118 mg/dL — ABNORMAL HIGH (ref 70–99)

## 2014-06-01 MED ORDER — FLUDROCORTISONE ACETATE 0.1 MG PO TABS: 0.1000 mg | ORAL_TABLET | Freq: Every day | ORAL | Status: DC

## 2014-06-01 MED ORDER — POLYSACCHARIDE IRON COMPLEX 150 MG PO CAPS
150.0000 mg | ORAL_CAPSULE | Freq: Every day | ORAL | Status: DC
  Administered 2014-06-01 – 2014-06-02 (×2): 150 mg via ORAL
  Filled 2014-06-01 (×2): qty 1

## 2014-06-01 MED ORDER — SODIUM CHLORIDE 0.9 % IV SOLN
Freq: Once | INTRAVENOUS | Status: AC
  Administered 2014-06-01: 10:00:00 via INTRAVENOUS

## 2014-06-01 MED ORDER — POLYSACCHARIDE IRON COMPLEX 150 MG PO CAPS: 150.0000 mg | ORAL_CAPSULE | Freq: Every day | ORAL | Status: DC

## 2014-06-01 MED ORDER — ACETAMINOPHEN 500 MG PO TABS
500.0000 mg | ORAL_TABLET | ORAL | Status: DC | PRN
  Administered 2014-06-01: 500 mg via ORAL
  Filled 2014-06-01: qty 1

## 2014-06-01 MED ORDER — MEDICAL COMPRESSION STOCKINGS MISC: 1.0000 | Freq: Every day | Status: DC

## 2014-06-01 NOTE — Progress Notes (Signed)
Subjective: [name].   Ms. Desiree Gutierrez was seen and examined this AM.  She says she slept well.  She requires some assistance with ambulation to the bedside commode.  She is eating and drinking normally.  She has not had any more syncopal episodes since yesterday.  Objective: Vital signs in last 24 hours: Filed Vitals:   05/31/14 2000 06/01/14 0000 06/01/14 0400 06/01/14 0821  BP: 97/60 91/58 98/57  105/50  Pulse: 97 83 81 103  Temp: 98.9 F (37.2 C) 98 F (36.7 C) 97.8 F (36.6 C) 98.3 F (36.8 C)  TempSrc: Oral Oral Oral Oral  Resp: 16 16 16 18   Height:      Weight:      SpO2: 99% 95% 99% 100%   Weight change:   Intake/Output Summary (Last 24 hours) at 06/01/14 0844 Last data filed at 06/01/14 0456  Gross per 24 hour  Intake   1080 ml  Output   3400 ml  Net  -2320 ml   Gen: AAO x 3, no acute distress, well developed, well nourished, sitting up in bed HEENT: [place]/AT, EOMI, MMM Heart: Regular rhythm, slightly tachy, normal S1 S2, no murmurs, rubs, or gallops Lungs: Clear to auscultation bilaterally, respirations unlabored Abd: Soft, non-tender, non-distended, + bowel sounds Ext: no edema, extremities warm and well perfused Neuro:  AAO x3, able to move extremities voluntarily, responding appropriately Psych:  Mood and affect appropriate, appropriate interaction  Lab Results:  CBG:  Recent Labs Lab 05/31/14 0800 05/31/14 0906 05/31/14 1051 05/31/14 1314 05/31/14 1810 06/01/14 0820  GLUCAP 66* 79 72 106* 119* 112*   Medications: I have reviewed the patient's current medications. Scheduled Meds: . fludrocortisone  0.1 mg Oral Daily  . heparin  5,000 Units Subcutaneous 3 times per day  . sodium chloride  3 mL Intravenous Q12H   Continuous Infusions: none   PRN Meds:.diphenhydrAMINE, docusate sodium, HYDROcodone-acetaminophen, polyethylene glycol   Assessment/Plan: 24 year old woman with hx of WPW s/p ablation in 2011 and POTS here after syncopal event while  [place].  Syncopal episode in patient with hx of POTS:  Last event was yesterday afternoon after ambulating back from bedside commode. - continue Florinef - encourage po hydration and liberalized salt intake (she reports good po) - 500cc NS bolus given slight tachy this AM - ambulate with RN - waist high support hose ordered yesterday but we may not have them in house; the patient was advised to obtain them from medical supply store; she does have knee high support hose - patient to follow-up with Dr. Alfonso EllisBeaty (we will call his office on Monday to arrange close follow-up) - She was advised to get in a safe position (ie supine) if she feels pre-syncopal. - The patient is not currently driving and she was advised that she must continue to not drive until cleared by her physician; cardiology recommends 6 months driving restriction from time of last syncopal event.  Wolf-Parkinson-White s/p ablation:  S/p ablation in 2011.  I appreciate cardiology's input during this admission.  LINQ was interrogated with no concerning findings.  They feel that the syncope is due to POTS and not WPW. -patient to follow-up with Dr. Alfonso EllisBeaty (will arrange close follow-up) -she has a LINQ device for monitoring  Postprandial hypoglycemia:  No CBGs below 60 and no D50 requirement during this admission.  CBGs in the last 24 hours have ranged from 66-112.  Fasting CBG this AM is 112.  Whipple's Triad  is not present. -no indication to for further work-up during this admission -She has been scheduled to establish care with [facility] PCP to further investigate these symptoms and I have encouraged her to keep this appointment.  Headache:  Likely soft tissue related to landing on floor (hit floor with her forehead during syncopal event).  No neuro deficits and head CT negative.  Her only pain medication requirement yesterday was a Norco yesterday AM and a Tylenol last night.  No pain medications this AM. -  continue Tylenol prn at discharge  Anemia: Hgb 11.4 --> 10.0 this admission.  There is likely dilution due to fluid administration.  Anemia panel c/w mild Fe deficiency.  Anemia likely contributing to her fatigue and headache. -start iron polysaccharide (Niferex)  daily given her Ferritin of 11; rx for Niferex at discharge or she can choose an OTC Fe supplement -monitor CBC with PCP   VTE ppx: [place] heparin Diet:  Regular Code:  Full   Dispo: The patient is medically stable for discharge home today.  She is eating and drinking well.  She was advised to continue good po, liberalize salt, wear support hose, continue Florinef, try to get in a safe position if she feels pre-syncopal, follow-up with PCP and Dr. Alfonso Ellis.  The patient feels ready for discharge and is agreeable to the plan.  The patient does not have a current PCP (No Pcp Per Patient) and does need an [facility] hospital follow-up appointment after discharge.  The patient does not have transportation limitations that hinder transportation to clinic appointments.  .Services Needed at time of discharge: Y = Yes, Blank = No PT:   OT:   RN:   Equipment:   Other:       Yolanda Manges, DO 06/01/2014, 8:44 AM

## 2014-06-01 NOTE — Progress Notes (Signed)
Utilization Review Completed.   Cyncere Ruhe, RN, BSN Nurse Case Manager  

## 2014-06-01 NOTE — Progress Notes (Signed)
CSW visited with pt per MD request.  Pt reluctant to d/c home this pm as she will be alone, and is afraid of "passing out" with no one else in the house.  Pt usually lives with her mother for whom she is the primary caregiver.  Pt's mother had a stroke 9 months ago and requires 24 hour supervision/assistance.  Pt's brother is currently providing care for the mother, and is reluctant to bring her to pt's home this pm, as pt is "too weak" to care for her.  Pt reports that she will feel more comfortable d/c'ing in the am because her [facility] will be their to help with her mother.  Pt expresses lack of familial or other support, stating that is just "me and my mother." Emotional support provided.  Case discussed with MD and RN.

## 2014-06-01 NOTE — Discharge Instructions (Addendum)
1. You will see Dr Alfonso EllisBeaty on 3/10 at 10 am.  If you do not hear from his office next week to confirm please give them a call.  We moved your appointment with Dr Rodena Medinionne Holmes to Wednesday 3/2 at 11 am. Please be sure to follow-up with the [facility] Medicine office at [facility] in March so they can check on your blood sugar and your iron levels.  2. Please take all medications as prescribed.  I sent the Florinef prescription to your pharmacy.  I have given you a paper prescription for the support hose.  You can take it to any medical supply store.  I have prescribed an iron supplement for you.  You can take the prescription medication or you can buy an iron supplement at the pharmacy (the pharmacist can help you choose the right one) depending on your preference and cost.  Supplementing your low iron will likely help improve the fatigue and headache you have been experiencing.  Please stop taking your multivitamin for now until you see your doctor.  They can decide if taking the multivitamin along with the Fe supplement is needed or if you just need one or the other.  3. If you have worsening of your symptoms or new symptoms arise, please call Dr. Tillie FantasiaBeaty's office or go to the ER immediately if symptoms are severe.  Postural Orthostatic Tachycardia Syndrome Postural orthostatic tachycardia syndrome (POTS) is an increased heart rate when going from a lying (supine) position to a standing position. The heart rate may increase more than 30 beats per minute (BPM) above its resting rate when going from a lying to a standing position. POTS occurs more frequently in women than in men.  SYMPTOMS  POTS symptoms may be increased in the morning. Symptoms of POTS include:  Fainting or near fainting.  Inability to think clearly.  Extreme or chronic fatigue.  Exercise intolerance.  Chest pain.  Having the lower legs develop a reddish-blue color due to decreased blood flow (acrocyanosis). CAUSES POTS can be  caused by different conditions. Sometimes, it has no known cause (idiopathic). Some causes of POTS include:  Viral illness.  Pregnancy.  Autoimmune diseases.  Medications.  Major surgery.  Trauma such as a car accident or major injury.  Medical conditions such as anemia, dehydration, and hyperthyroidism. DIAGNOSIS  POTS is diagnosed by:  Taking a complete history and physical exam.  Measuring the heart rate while lying and then upon standing.  Measuring blood pressure when going from a lying to a standing position. POTS is usually not associated with low blood pressure (orthostatic hypotension) when going from a lying to standing position. While standing, blood pressure should be taken 2, 5, and 10 minutes after getting up. TREATMENT  Treatment of POTS depends upon the severity of the symptoms. Treatment includes:  Drinking plenty of fluids to avoid getting dehydrated.  Avoiding very hot environments to not get overheated.  Increasing your dietary salt intake as instructed by your caregiver.  Taking different types of medications as prescribed for POTS.  Avoiding some classes of medications such as vasodilators and diuretics. SEEK IMMEDIATE MEDICAL CARE IF  You have severe chest pain that does not go away. Call your local emergency service immediately.  You feel your heart racing or beating rapidly.  You feel like passing out.  You have very confused thinking. MAKE SURE YOU  Understand these instructions.  Will watch your condition.  Will get help right away if you are not doing well  or get worse. Document Released: 03/11/2002 Document Revised: 08/05/2013 Document Reviewed: 05/19/2010 [facility] Patient Information 2015 [place], [place]. This information is not intended to replace advice given to you by your health care provider. Make sure you discuss any questions you have with your health care provider.   Fludrocortisone tablets What is this  medicine? FLUDROCORTISONE (floo droe KOR ti sone) is a corticosteroid. It is used to treat Addison's disease and to treat a salt losing condition called adrenogenital syndrome. This medicine may be used for other purposes; ask your health care provider or pharmacist if you have questions. COMMON BRAND NAME(S): Florinef What should I tell my health care provider before I take this medicine? They need to know if you have any of these conditions: -Cushing's syndrome -diabetes -heart problems or disease -high blood pressure -infection like herpes, measles, tuberculosis, or chickenpox -liver disease -myasthenia gravis -osteoporosis -stomach, ulcer or intestine disease including colitis and diverticulitis -thyroid problem -an unusual or allergic reaction to fludrocortisone, corticosteroids, other medicines, lactose, foods, dyes, or preservatives -pregnant or trying to get pregnant -breast-feeding How should I use this medicine? Take this medicine by mouth with a glass of water. Follow the directions on the prescription label. Take it with food or milk to avoid stomach upset. If you are taking this medicine once a day, take it in the morning. Do not take more medicine than you are told to take. Do not suddenly stop taking your medicine because you may develop a severe reaction. Your doctor will tell you how much medicine to take. If your doctor wants you to stop the medicine, the dose may be slowly lowered over time to avoid any side effects. Talk to your pediatrician regarding the use of this medicine in children. Special care may be needed. Patients over 73 years old may have a stronger reaction and need a smaller dose. Overdosage: If you think you have taken too much of this medicine contact a poison control center or emergency room at once. NOTE: This medicine is only for you. Do not share this medicine with others. What if I miss a dose? If you miss a dose, take it as soon as you can. If it  is almost time for your next dose, take only that dose. Do not take double or extra doses. What may interact with this medicine? Do not take this medicine with any of the following medications: -mifepristone, RU-486 This medicine may also interact with the following medications: -amphotericin B -aspirin and aspirin-like drugs -barbiturates like phenobarbital -digoxin -diuretics -female hormones, like estrogens or progestins and birth control pills -female hormones -medicines for diabetes like insulin -medicines that treat or prevent blood clots like warfarin -phenytoin -rifampin -vaccines This list may not describe all possible interactions. Give your health care provider a list of all the medicines, herbs, non-prescription drugs, or dietary supplements you use. Also tell them if you smoke, drink alcohol, or use illegal drugs. Some items may interact with your medicine. What should I watch for while using this medicine? Visit your doctor or health care professional for regular checks on your progress. If you are taking this medicine over a prolonged period, carry an identification card with your name and address, the type and dose of your medicine, and your doctor's name and address. This medicine may increase your risk of getting an infection. Stay away from people who are sick. Tell your doctor or health care professional if you are around anyone with measles or chickenpox. If you are going  to have surgery, tell your doctor or health care professional that you have taken this medicine within the last twelve months. Ask your doctor or health care professional about your diet. You may need to lower the amount of salt you eat. The medicine can increase your blood sugar. If you are a diabetic check with your doctor if you need help adjusting the dose of your diabetic medicine. What side effects may I notice from receiving this medicine? Side effects that you should report to your doctor or  health care professional as soon as possible: -changes in vision -mental depression, mood swings, mistaken feelings of self importance or of being mistreated -sudden weight gain -swelling of the feet or lower legs -unusually weak or tired Side effects that usually do not require medical attention (report to your doctor or health care professional if they continue or are bothersome): -dizziness -headache -loss of appetite -nausea, vomiting -trouble sleeping This list may not describe all possible side effects. Call your doctor for medical advice about side effects. You may report side effects to FDA at 1-800-FDA-1088. Where should I keep my medicine? Keep out of the reach of children. Store at room temperature between 15 and 30 degrees C (59 and 86 degrees F). Protect from excessive heat. Throw away any unused medicine after the expiration date. NOTE: This sheet is a summary. It may not cover all possible information. If you have questions about this medicine, talk to your doctor, pharmacist, or health care provider.  2015, Elsevier/Gold Standard. (2007-07-30 15:29:43)  Iron Deficiency Anemia Anemia is a condition in which there are less red blood cells or hemoglobin in the blood than normal. Hemoglobin is the part of red blood cells that carries oxygen. Iron deficiency anemia is anemia caused by too little iron. It is the most common type of anemia. It may leave you tired and short of breath. CAUSES   Lack of iron in the diet.  Poor absorption of iron, as seen with intestinal disorders.  Intestinal bleeding.  Heavy periods. SIGNS AND SYMPTOMS  Mild anemia may not be noticeable. Symptoms may include:  Fatigue.  Headache.  Pale skin.  Weakness.  Tiredness.  Shortness of breath.  Dizziness.  Cold hands and feet.  Fast or irregular heartbeat. DIAGNOSIS  Diagnosis requires a thorough evaluation and physical exam by your health care provider. Blood tests are generally  used to confirm iron deficiency anemia. Additional tests may be done to find the underlying cause of your anemia. These may include:  Testing for blood in the stool (fecal occult blood test).  A procedure to see inside the colon and rectum (colonoscopy).  A procedure to see inside the esophagus and stomach (endoscopy). TREATMENT  Iron deficiency anemia is treated by correcting the cause of the deficiency. Treatment may involve:  Adding iron-rich foods to your diet.  Taking iron supplements. Pregnant or breastfeeding women need to take extra iron because their normal diet usually does not provide the required amount.  Taking vitamins. Vitamin C improves the absorption of iron. Your health care provider may recommend that you take your iron tablets with a glass of orange juice or vitamin C supplement.  Medicines to make heavy menstrual flow lighter.  Surgery. HOME CARE INSTRUCTIONS   Take iron as directed by your health care provider.  If you cannot tolerate taking iron supplements by mouth, talk to your health care provider about taking them through a vein (intravenously) or an injection into a muscle.  For the best  iron absorption, iron supplements should be taken on an empty stomach. If you cannot tolerate them on an empty stomach, you may need to take them with food.  Do not drink milk or take antacids at the same time as your iron supplements. Milk and antacids may interfere with the absorption of iron.  Iron supplements can cause constipation. Make sure to include fiber in your diet to prevent constipation. A stool softener may also be recommended.  Take vitamins as directed by your health care provider.  Eat a diet rich in iron. Foods high in iron include liver, lean beef, whole-grain bread, eggs, dried fruit, and dark green leafy vegetables. SEEK IMMEDIATE MEDICAL CARE IF:   You faint. If this happens, do not drive. Call your local emergency services (911 in [place] if no  other help is available.  You have chest pain.  You feel nauseous or vomit.  You have severe or increased shortness of breath with activity.  You feel weak.  You have a rapid heartbeat.  You have unexplained sweating.  You become light-headed when getting up from a chair or bed. MAKE SURE YOU:   Understand these instructions.  Will watch your condition.  Will get help right away if you are not doing well or get worse. Document Released: 03/18/2000 Document Revised: 03/26/2013 Document Reviewed: 11/26/2012 [facility] Patient Information 2015 [place], [place]. This information is not intended to replace advice given to you by your health care provider. Make sure you discuss any questions you have with your health care provider.

## 2014-06-01 NOTE — Discharge Summary (Signed)
Patient Name:  Desiree Gutierrez  MRN: [id number]  PCP: No PCP Per Patient  DOB:  [birth year]       Date of Admission:  05/29/2014  Date of Discharge:  2/29/2015     Attending Physician: Dr. Farley Ly, MD         DISCHARGE DIAGNOSES: 1.   Syncope  2.   Postural orthostatic tachycardia syndrome 3.   Wolff-Parkinson-White Syndrome status post ablation   4.   Headache 5.   Iron deficiency anemia   DISPOSITION AND FOLLOW-UP: Exa Mermelstein is to follow-up with the listed providers as detailed below, at patient's visiting, please address following issues:  1) Recurrent syncope    2) Compliance with Florinef, support hose, increased salt 3) CBC in 2 months to check Hgb on Fe supplement  Follow-up Information    Follow up with Maryln Manuel, MD On 06/12/2014.   Specialty:  Cardiology   Why:  for hospital follow-up @ 10 am   Contact information:   [address] [place] [address] [phone]       Follow up with Donia Ast, MD On 06/04/2014.   Specialty:  Internal Medicine   Why:  @ 11 am   Contact information:   [address] [place] [address] [phone]          Discharge Instructions    Call MD for:  difficulty breathing, headache or visual disturbances    Complete by:  As directed      Call MD for:  extreme fatigue    Complete by:  As directed      Call MD for:  hives    Complete by:  As directed      Call MD for:  persistant dizziness or light-headedness    Complete by:  As directed      Call MD for:  persistant nausea and vomiting    Complete by:  As directed      Call MD for:  redness, tenderness, or signs of infection (pain, swelling, redness, odor or green/yellow discharge around incision site)    Complete by:  As directed      Call MD for:  severe uncontrolled pain    Complete by:  As directed      Call MD for:  temperature >100.4    Complete by:  As directed      Driving Restrictions    Complete  by:  As directed   Please do not drive until a physician has evaluated you and cleared you for driving.     Increase activity slowly    Complete by:  As directed             DISCHARGE MEDICATIONS:   Medication List    STOP taking these medications        multivitamin with minerals Tabs tablet     traMADol 50 MG tablet  Commonly known as:  ULTRAM      TAKE these medications        fludrocortisone 0.1 MG tablet  Commonly known as:  FLORINEF  Take 1 tablet (0.1 mg total) by mouth daily.     iron polysaccharides 150 MG capsule  Commonly known as:  NIFEREX  Take 1 capsule (150 mg total) by mouth daily.     Medical Compression Stockings Misc  1 each by Does not apply route daily.         CONSULTS:   Cardiology - Electrophysiology  PROCEDURES PERFORMED:  Ct Head Wo Contrast  05/31/2014   CLINICAL DATA:  Syncopal episodes and blurry vision  EXAM: CT HEAD WITHOUT CONTRAST  TECHNIQUE: Contiguous axial images were obtained from the base of the skull through the vertex without intravenous contrast.  COMPARISON:  03/12/14  FINDINGS: The bony calvarium is intact. The ventricles are of normal size and configuration. No findings to suggest acute hemorrhage, acute infarction or space-occupying mass lesion are noted.  IMPRESSION: No acute intracranial abnormality noted.   Electronically Signed   By: Alcide Clever M.D.   On: 05/31/2014 08:19       ADMISSION DATA: H&P: HPI: Desiree Gutierrez is a 24 year old woman with Wolff-Parkinson-White syndrome s/p ablation 1-/2011 at [facility] who presents with syncope. 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 She fell from a standing position and hit her head. She then reported a headache but it has largely resolved with tylenol. She said she was initially confused as she usually is right after her syncopal episodes but denied any bowel or  bladder incontinence. EMS noted CBG of 65. She says she has had issues with hypoglycemia (checks using her mom's meter) down to 50s-60s even a few hours after eating. She follows with Dr Cristi Loron of [facility] cardiology and had a loop recorder inserted in 01/2014. He also just stopped midodrine a week ago due to headaches. He was contacted by ED staff who recommended admission.   In the ED, she had spontaneous episode of tachycardia at 164 which spontaneously recovered in less than 5 minutes. She now reports completely negative review of systems including chest pain, palpitations, SOB.  Of note, she was here 05/25/14 for syncopal episode and they called Dr Cristi Loron who recommended no work-up at that time.  Physical Exam: Blood pressure 113/83, pulse 94, temperature 98.3 F (36.8 C), temperature source Oral, resp. rate 13, height 5\' 3"  (1.6 m), weight 132 lb (59.875 kg), last menstrual period 05/17/2014, SpO2 100 %.  Gen: A&O x 4, no acute distress, well developed, well nourished HEENT: Atraumatic, PERRL, EOMI, sclerae anicteric, moist mucous membranes Heart: Regular rate and rhythm, normal S1 S2, no murmurs, rubs, or gallops Lungs: Clear to auscultation bilaterally, respirations unlabored Abd: Soft, non-tender, non-distended, + bowel sounds, no hepatosplenomegaly Ext: No edema or cyanosis  Labs: Basic Metabolic Panel:  Recent Labs (last 2 labs)      Recent Labs  05/29/14 1127  NA 136  K 3.9  CL 104  CO2 27  GLUCOSE 83  BUN 9  CREATININE 0.74  CALCIUM 9.5     CBC:  Recent Labs (last 2 labs)      Recent Labs  05/29/14 1127  WBC 4.9  HGB 11.4*  HCT 36.3  MCV 88.5  PLT 209       Recent Labs (last 2 labs)      Recent Labs  05/29/14 1125  GLUCAP 71         HOSPITAL COURSE: Syncopal episode in patient with POTS: The patient was admitted after a syncopal event that occurred while standing in line at the bank. The patient had about four  additional events during admission.  The events involved a brief loss of consciousness preceded by tachycardia.  The patient never experienced any seizure-like activity.  Cardiology was consulted given her history of WPW and her LINQ was interrogated.  The electrophysiologist reviewed her LINQ  and evaluated the patient and based on his evaluation he feels that her syncope is due to POTS and not related to her WPW.  Per cardiology recommendation, the patient hydrated with IV fluids, re-treated with Florinef (she has tried this medication in the past) and encouraged to increase salt and fluid intake.  She was provided with a prescription for waist-high support hose.  She was prescribed Florinef 0.1mg  daily during admission and at discharge.  She was advised to get in a safe position (ie supine) if she feels pre-syncopal. The patient is not currently driving and she was advised that she must continue to not drive until cleared by her physician; cardiology recommends 6 months driving restriction from time of last syncopal event.  The patient is followed by Dr. Alfonso EllisBeaty, an electrophysiologist at [facility] and close follow-up has been arranged with his office.   Wolf-Parkinson-White s/p ablation: The patient's accessory pathway was ablated in 2011. It was noted at that time that there were two pathways.  The patient is currently under the care of [facility].  EP evaluated her as described above.  Her LINQ was interrogated and cardiology found no concerning findings. They feel that the syncope is due to POTS and not WPW.  We have arranged close follow-up with Dr. Alfonso EllisBeaty.  Reported postprandial hypoglycemia: The patient has no history of diabetes but reports using her mother's blood sugar monitor to check her CBGs and she has noticed low blood sugars about 2 hours after eating.  She reports values of 50s-60s.  Her CBGs were monitored after meals, at bedtime and during her syncopal episodes.  She had no CBGs  below 60 and no D50 requirement during this admission. There was no indication for further work-up during this admission.  She has been scheduled to establish care with [facility] PCP to further investigate these symptoms and I have encouraged her to keep this appointment.  Headache: Her headache is likely soft tissue related due to landing on the hard floor (hit her forehead during syncopal event). She exhibited no neuro deficits but a head CT was obtained due to persistent headache.  Head CT was negative for acute abnormality.     Anemia: Her hemoglobin dropped from 11.4 to 10.0 this admission. There is likely a component of dilution due to fluid administration. Anemia panel was consistent with mild iron deficiency. Anemia likely contributing to her fatigue and headache.  Iron polysaccharide (Niferex) 150mg  daily was started.  She provided with a prescription at discharge.  I have discontinued her multivitamin while she is on iron until she meets with her doctor.  She will need monitoring of her hemoglobin in 2 months.  DISCHARGE DATA: Vital Signs: BP 99/64 mmHg  Pulse 90  Temp(Src) 99.1 F (37.3 C) (Oral)  Resp 18  Ht 5\' 3"  (1.6 m)  Wt 131 lb (59.421 kg)  BMI 23.21 kg/m2  SpO2 100%  LMP 05/17/2014  Discharge Physical Exam: Gen: AAO x 3, no acute distress, well developed, well nourished, sitting up in bed HEENT: [place]/AT, EOMI, MMM Heart: Regular rhythm, slightly tachy, normal S1 S2, no murmurs, rubs, or gallops Lungs: Clear to auscultation bilaterally, respirations unlabored Abd: Soft, non-tender, non-distended, + bowel sounds Ext: no edema, extremities warm and well perfused Neuro: AAO x3, able to move extremities voluntarily, responding appropriately Psych: Mood and affect appropriate, appropriate interaction  Labs: Basic Metabolic Panel:  Recent Labs Lab 05/29/14 1127  NA 136  K 3.9  CL 104  CO2 27  GLUCOSE 83  BUN 9  CREATININE 0.74  CALCIUM 9.5   CBC:  Recent  Labs Lab 05/30/14 0327 05/31/14 0430  WBC 6.8 6.0  HGB 10.5* 10.0*  HCT 33.9* 31.8*  MCV 86.7 89.1  PLT 228 194  CBG:  Recent Labs Lab 05/31/14 1314 05/31/14 1810 06/01/14 0820 06/01/14 1255 06/01/14 1902 06/02/14 0806  GLUCAP 106* 119* 112* 118* 104* 110*   Thyroid Function Tests:  Recent Labs Lab 05/30/14 0844  TSH 2.695   Anemia Panel:  Recent Labs Lab 05/30/14 0844  VITAMINB12 868  FOLATE 19.9  FERRITIN 11  TIBC 410  IRON 42  RETICCTPCT 0.8       Services Ordered on Discharge: Y = Yes; Blank = No PT:   OT:   RN:   Equipment:   Other:      Time Spent on Discharge: 35 min   Signed: Farley Ly PGY 2, Internal Medicine Resident 06/02/2014, 11:08 AM

## 2014-06-02 DIAGNOSIS — W19XXXA Unspecified fall, initial encounter: Secondary | ICD-10-CM | POA: Insufficient documentation

## 2014-06-02 DIAGNOSIS — Z9889 Other specified postprocedural states: Secondary | ICD-10-CM

## 2014-06-02 DIAGNOSIS — D509 Iron deficiency anemia, unspecified: Secondary | ICD-10-CM

## 2014-06-02 DIAGNOSIS — R55 Syncope and collapse: Secondary | ICD-10-CM | POA: Diagnosis not present

## 2014-06-02 DIAGNOSIS — I498 Other specified cardiac arrhythmias: Secondary | ICD-10-CM

## 2014-06-02 DIAGNOSIS — I456 Pre-excitation syndrome: Secondary | ICD-10-CM | POA: Diagnosis not present

## 2014-06-02 DIAGNOSIS — E162 Hypoglycemia, unspecified: Secondary | ICD-10-CM | POA: Insufficient documentation

## 2014-06-02 DIAGNOSIS — R51 Headache: Secondary | ICD-10-CM

## 2014-06-02 LAB — GLUCOSE, CAPILLARY
GLUCOSE-CAPILLARY: 104 mg/dL — AB (ref 70–99)
Glucose-Capillary: 110 mg/dL — ABNORMAL HIGH (ref 70–99)

## 2014-06-02 MED ORDER — BISACODYL 5 MG PO TBEC: 5.0000 mg | DELAYED_RELEASE_TABLET | Freq: Every day | ORAL | Status: DC | PRN

## 2014-06-02 NOTE — Care Management Note (Signed)
    Page 1 of 1   06/02/2014     11:31:59 AM CARE MANAGEMENT NOTE 06/02/2014  Patient:  Desiree Gutierrez,Desiree Gutierrez   Account Number:  [id number]  Date Initiated:  06/02/2014  Documentation initiated by:  GRAVES-BIGELOW,Antoinette Borgwardt  Subjective/Objective Assessment:   Pt admitted for syncopal episodes. 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     Action/Plan:   CM was asked to see pt in regards to resources for her mother. CM did see pt in regards to resources- pt's mother has Medicaid- CM suggested she go through her mom's PCP for increased Medicaid hours and possible CAPS.   Anticipated DC Date:  06/02/2014   Anticipated DC Plan:  HOME/SELF CARE      DC Planning Services  CM consult      Choice offered to / List presented to:             Status of service:  Completed, signed off Medicare Important Message given?  YES (If response is "NO", the following Medicare IM given date fields will be blank) Date Medicare IM given:   Medicare IM given by:   Date Additional Medicare IM given:   Additional Medicare IM given by:    Discharge Disposition:  HOME/SELF CARE  Per UR Regulation:  Reviewed for med. necessity/level of care/duration of stay  If discussed at Long Length of Stay Meetings, dates discussed:    Comments:  CM did provide pt with Caregiver Support Resources. No further needs from CM at this time.

## 2014-06-02 NOTE — Progress Notes (Signed)
UR completed 

## 2014-06-02 NOTE — Progress Notes (Signed)
Subjective: [name].   Desiree Gutierrez was seen and examined this AM.  She says she was uncomfortable with discharge yesterday due to her mother's condition (previously had a stroke) but is prepared and comfortable with discharge this morning. No syncopal episode or prodrome since Saturday. No complaints.   Objective: Vital signs in last 24 hours: Filed Vitals:   06/01/14 2007 06/02/14 0000 06/02/14 0400 06/02/14 0811  BP: 112/66 103/62 107/60 99/64  Pulse: 96 89 93 90  Temp: 98.7 F (37.1 C) 98.8 F (37.1 C) 97.9 F (36.6 C) 99.1 F (37.3 C)  TempSrc: Oral Oral Oral Oral  Resp: Height:      Weight:      SpO2: 100% 99% 99% 100%   Weight change:   Intake/Output Summary (Last 24 hours) at 06/02/14 1108 Last data filed at 06/02/14 0800  Gross per 24 hour  Intake    980 ml  Output    850 ml  Net    130 ml   Gen: AAO x 3, no acute distress, well developed, well nourished, sitting up in bed HEENT: [place]/AT, EOMI, MMM Heart: Regular rhythm, slightly tachy, normal S1 S2, no murmurs, rubs, or gallops Lungs: Clear to auscultation bilaterally, respirations unlabored Abd: Soft, non-tender, non-distended, + bowel sounds Ext: no edema, extremities warm and well perfused Neuro:  AAO x3, able to move extremities voluntarily, responding appropriately Psych:  Mood and affect appropriate, appropriate interaction  Lab Results:  CBG:  Recent Labs Lab 05/31/14 1314 05/31/14 1810 06/01/14 0820 06/01/14 1255 06/01/14 1902 06/02/14 0806  GLUCAP 106* 119* 112* 118* 104* 110*   Medications: I have reviewed the patient's current medications. Scheduled Meds: . fludrocortisone  0.1 mg Oral Daily  . heparin  5,000 Units Subcutaneous 3 times per day  . iron polysaccharides  150 mg Oral Daily  . 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  Syncopal episode in patient with hx of POTS:  Last event was Saturday afternoon after ambulating back from bedside commode. - continue Florinef - encourage po hydration and liberalized salt intake (she reports good po) - ambulate with RN - waist high support hose ordered yesterday but we may not have them in house; the patient was advised to obtain them from medical supply store; she does have knee high support hose - patient to follow-up with Dr. Alfonso Ellis (we will call his office today to arrange close follow-up) - She was advised to get in a safe position (ie supine) if she feels pre-syncopal. - The patient is not currently driving and she was advised that she must continue to not drive until cleared by her physician; cardiology recommends 6 months driving restriction from time of last syncopal event.  Wolf-Parkinson-White s/p ablation:  S/p ablation in 2011.  I appreciate cardiology's input during this admission.  LINQ was interrogated with no concerning findings.  They feel that the syncope is due to POTS and not WPW. -patient to follow-up with Dr. Alfonso Ellis (will arrange close follow-up) -she has a LINQ device for monitoring  Postprandial hypoglycemia:  No CBGs below 60 and no D50 requirement during this admission.  CBGs in the last 24 hours have ranged from 104-118.  Fasting CBG  this AM is not available.  Whipple's Triad is not present. -no indication to for further work-up during this admission -She has been scheduled to [facility] care with [facility] to further investigate these symptoms and I have encouraged her to keep this appointment.  Headache:  Likely soft tissue related to landing on floor (hit floor with her forehead during syncopal event).  No neuro deficits and head CT negative.  Her only pain medication requirement yesterday was a  Tylenol last night.  No pain medications this AM. - continue Tylenol prn at discharge  Anemia: Hgb 11.4 --> 10.0 this admission.  There is likely dilution due to fluid administration.  Anemia panel c/w mild Fe deficiency.  Anemia likely contributing to her fatigue and headache. -start iron polysaccharide (Niferex) 150mg  daily given her Ferritin of 11; rx for Niferex at discharge or she can choose an OTC Fe supplement -monitor CBC with PCP   VTE ppx: [place] heparin Diet:  Regular Code:  Full   Dispo: The patient is medically stable for discharge home today.  She is eating and drinking well.  She was advised to continue good po, liberalize salt, wear support hose, continue Florinef, try to get in a safe position if she feels pre-syncopal, follow-up with PCP and Dr. Alfonso EllisBeaty.  The patient feels ready for discharge and is agreeable to the plan.  The patient does not have a current PCP (No Pcp Per Patient) and [facility] after discharge.  The patient does not have transportation limitations that hinder transportation to clinic appointments.  .Services Needed at time of discharge: Y = Yes, Blank = No PT:   OT:   RN:   Equipment:   Other:       Desiree HatchetAdam L Carlicia Leavens, MD 06/02/2014, 11:08 AM

## 2014-06-02 NOTE — Progress Notes (Signed)
  Subjective: Pt reports feeling much better.  She denies having headache, dizziness, recent tachycardic episodes since yesterday.  She is much more comfortable with going home today since she'll have someone there with her.    Objective: Vital signs in last 24 hours: Filed Vitals:   06/01/14 2007 06/02/14 0000 06/02/14 0400 06/02/14 0811  BP: 112/66 103/62 107/60 99/64  Pulse: 96 89 93 90  Temp: 98.7 F (37.1 C) 98.8 F (37.1 C) 97.9 F (36.6 C) 99.1 F (37.3 C)  TempSrc: Oral Oral Oral Oral  Resp: 18 18 18 18   Height:      Weight:      SpO2: 100% 99% 99% 100%    Intake/Output Summary (Last 24 hours) at 06/02/14 1056 Last data filed at 06/02/14 0800  Gross per 24 hour  Intake    980 ml  Output    850 ml  Net    130 ml   General appearance: alert and sitting up and more comfortable Lungs: clear to auscultation bilaterally Heart: regular rate and rhythm, S1, S2 normal, no murmur, click, rub or gallop Extremities: extremities normal, atraumatic, no cyanosis or edema  Lab Results:  Ref. Range 05/31/2014 18:10 06/01/2014 08:20 06/01/2014 12:55 06/01/2014 19:02 06/02/2014 08:06  Glucose-Capillary Latest Range: 70-99 mg/dL 161119 (H) 096112 (H) 045118 (H) 104 (H) 110 (H)   Medications: I have reviewed the patient's current medications.  Scheduled Meds: . fludrocortisone  0.1 mg Oral Daily  . heparin  5,000 Units Subcutaneous 3 times per day  . iron polysaccharides  150 mg Oral Daily  . sodium chloride  3 mL Intravenous Q12H   Continuous Infusions:  PRN Meds:.acetaminophen, bisacodyl, diphenhydrAMINE, docusate sodium, HYDROcodone-acetaminophen, polyethylene glycol Assessment/Plan: Principal Problem:   Syncope Active Problems:   WOLFF-PARKINSON-WHITE (WPW) SYNDROME status post ablation   POTS (postural orthostatic tachycardia syndrome)   Headache   #WOLFF-PARKINSON-WHITE (WPW) SYNDROME status post ablation S/p ablation in 2011. Pt wearing loop monitor and is followed closely by her  cardiologist [facility]. -Follow up appointment scheduled for next week with regular cardiologist -fludrocortisone 0.1 MG tablet, retry this medication and monitor side effects -Pt advised to wear waist high compression stockings and stay hydrated     #Post prandial Hypoglycemia Possibly post prandial hypoglycemia, pt has reported low glucose readings but no reports of symptoms such as tremulousness and palpitations. She does have a persistent headache and dizziness with moving, which could be due to autonomic dysfunction and low glucose levels are exacerbating these symptoms. -Glucose levels consistently above 100 with improvement in clinical symptoms -Pt has a nutritionist and has already been advised to eat a high protein diet to help prevent hypoglycemic episodes -Follow up appointment with new PCP 06/04/14.    #Postural Orthostatic Tachycardia Syndrome/Dysautomonia Per EP consult, pt's syncopal episodes are most likely due to POTS. The tachycardia is in response to her autonomic dysfunction.  Recommendations for the pt: -salt tablets -increased hydration -waist high support hoses -no driving x6 months from last syncopal event -follow up with regular cardiologist 06/12/14  Dispo:  Discharge with follow up with cardiologist and primary care provider  This is a Medical Student Note.  The care of the patient was discussed with Dr. Meredith PelJoines and the assessment and plan formulated with their assistance.  Please see their attached note for official documentation of the daily encounter.     Sonda RumbleKeyonna M Zebedee Segundo, Med Student 06/02/2014, 10:56 AM

## 2014-06-02 NOTE — Progress Notes (Signed)
Ms. Desiree Gutierrez is a 24 yo BF recovering from WPW Heart Condition lives with a mother recovering from CVStroke. She is a pyschology major attending college full time. Chaplain spent time reviewing discernment issues related to career, health, and emotoinal life changes. Ms Desiree Gutierrez would like to be involed in [employer].

## 2014-06-24 ENCOUNTER — Emergency Department (HOSPITAL_COMMUNITY): Payer: BLUE CROSS/BLUE SHIELD

## 2014-06-24 ENCOUNTER — Encounter (HOSPITAL_COMMUNITY): Payer: Self-pay | Admitting: Neurology

## 2014-06-24 ENCOUNTER — Emergency Department (HOSPITAL_COMMUNITY)
Admission: EM | Admit: 2014-06-24 | Discharge: 2014-06-24 | Disposition: A | Discharge status: Home / Self Care | Payer: Medicaid Other | Attending: Emergency Medicine | Admitting: Emergency Medicine

## 2014-06-24 DIAGNOSIS — Z79899 Other long term (current) drug therapy: Secondary | ICD-10-CM | POA: Insufficient documentation

## 2014-06-24 DIAGNOSIS — R0789 Other chest pain: Secondary | ICD-10-CM

## 2014-06-24 DIAGNOSIS — Z8679 Personal history of other diseases of the circulatory system: Secondary | ICD-10-CM | POA: Insufficient documentation

## 2014-06-24 DIAGNOSIS — Z7952 Long term (current) use of systemic steroids: Secondary | ICD-10-CM | POA: Insufficient documentation

## 2014-06-24 HISTORY — DX: Headache: R51

## 2014-06-24 HISTORY — DX: Headache, unspecified: R51.9

## 2014-06-24 LAB — BASIC METABOLIC PANEL
Anion gap: 9 (ref 5–15)
BUN: 5 mg/dL — AB (ref 6–23)
CO2: 23 mmol/L (ref 19–32)
Calcium: 9.6 mg/dL (ref 8.4–10.5)
Chloride: 107 mmol/L (ref 96–112)
Creatinine, Ser: 0.78 mg/dL (ref 0.50–1.10)
GFR calc Af Amer: >90 mL/min (ref >90)
GFR calc non Af Amer: >90 mL/min (ref >90)
GLUCOSE: 102 mg/dL — AB (ref 70–99)
POTASSIUM: 3.9 mmol/L (ref 3.5–5.1)
Sodium: 139 mmol/L (ref 135–145)

## 2014-06-24 LAB — CBC
HEMATOCRIT: 35 % — AB (ref 36.0–46.0)
HEMOGLOBIN: 11.2 g/dL — AB (ref 12.0–15.0)
MCH: 27.8 pg (ref 26.0–34.0)
MCHC: 32 g/dL (ref 30.0–36.0)
MCV: 86.8 fL (ref 78.0–100.0)
PLATELETS: 226 10*3/uL (ref 150–400)
RBC: 4.03 MIL/uL (ref 3.87–5.11)
RDW: 12.2 % (ref 11.5–15.5)
WBC: 6.4 10*3/uL (ref 4.0–10.5)

## 2014-06-24 LAB — I-STAT TROPONIN, ED: TROPONIN I, POC: 0 ng/mL (ref 0.00–0.08)

## 2014-06-24 LAB — CBG MONITORING, ED: GLUCOSE-CAPILLARY: 89 mg/dL (ref 70–99)

## 2014-06-24 LAB — POC OCCULT BLOOD, ED: Fecal Occult Bld: NEGATIVE

## 2014-06-24 NOTE — ED Notes (Addendum)
Pt standing at bedside getting dressed, pt has no complaints, pt is steady on feet. Pt notified that staff will be in to wheel her out as soon as she is finished getting dressed. Wheelchair at bedside. Curtain drawn for pt privacy.

## 2014-06-24 NOTE — ED Provider Notes (Signed)
CSN: [id number]     Arrival date & time 06/24/14  1528 History   First MD Initiated Contact with Patient 06/24/14 1827     Chief Complaint  Patient presents with  . Chest Pain     HPI Pt was seen at 1835. Per pt, c/o gradual onset and persistence of constant chest "pain" for the past 2 weeks, worse since 4 days ago. Pt describes the CP as constant "squeezing" pain, with intermittent "sharp" pains. Pt states she told her Cards Dr. Alfonso Ellis at [facility] regarding same during her office visit on 06/12/14, but "he didn't say anything," so she came to the ED today for further evaluation. Pt also c/o "black stools" for the past 3 weeks. Pt states 3 weeks ago she was d/c from [facility] for s/p syncopal episode and anemia, rx  Niferex. Pt was also evaluated at [facility] ED by the EDP and Cards MD on 06/12/14 for recurrent syncopal episode (had syncopal episode in office). Denies palpitations, no cough, no injury, no abd pain, no vomiting/diarrhea, no fevers, no rash, no syncope. The symptoms have been associated with no other complaints. The patient has a significant history of similar symptoms previously, recently being evaluated for this complaint and multiple prior evals for same.     Past Medical History  Diagnosis Date  . WPW (Wolff-Parkinson-White syndrome)     a. s/p ablation (2011) Dr Gevena Cotton at [facility] (posterior lateral pathway)  . Vasovagal syncope     a. positive tilt 2011 b. failed medical therapy with Florinef, Midodrine, Inderal, Celexa  . POTS (postural orthostatic tachycardia syndrome)   . Palpitations     a. s/p MDT ILR implanted by Dr Alfonso Ellis [facility])  . Headache   . Syncope     recurrent, daily   Past Surgical History  Procedure Laterality Date  . Cardiac electrophysiology study and ablation  2011    Dr. Gevena Cotton at [facility]- left posterior pathway ablation for WPW  . Tilt table study  2011    neurally mediated syncope  . Loop recorder implant  01/22/2014    MDT LINQ implanted by Dr Alfonso Ellis at  [facility] for evaluation of palpitations/syncope   Family History  Problem Relation Age of Onset  . Diabetes Mother   . Hypertension Mother   . Stroke Mother   . Hypertension Brother    History  Substance Use Topics  . Smoking status: Never Smoker   . Smokeless tobacco: Not on file  . Alcohol Use: No    Review of Systems ROS: Statement: All systems negative except as marked or noted in the HPI; Constitutional: Negative for fever and chills. ; ; Eyes: Negative for eye pain, redness and discharge. ; ; ENMT: Negative for ear pain, hoarseness, nasal congestion, sinus pressure and sore throat. ; ; Cardiovascular: Negative for palpitations, diaphoresis, dyspnea and peripheral edema. ; ; Respiratory: Negative for cough, wheezing and stridor. ; ; Gastrointestinal: Negative for nausea, vomiting, diarrhea, abdominal pain, blood in stool, hematemesis, jaundice and rectal bleeding. . ; ; Genitourinary: Negative for dysuria, flank pain and hematuria. ; ; Musculoskeletal: +CP. Negative for back pain and neck pain. Negative for swelling and trauma.; ; Skin: Negative for pruritus, rash, abrasions, blisters, bruising and skin lesion.; ; Neuro: Negative for headache, lightheadedness and neck stiffness. Negative for weakness, altered level of consciousness , altered mental status, extremity weakness, paresthesias, involuntary movement, seizure and syncope.      Allergies  Amitiza; Ketorolac tromethamine; Metoclopramide; and Midodrine  Home Medications   Prior  to Admission medications   Medication Sig Start Date End Date Taking? Authorizing Provider  Elastic Bandages & Supports (MEDICAL COMPRESSION STOCKINGS) MISC 1 each by Does not apply route daily. 06/01/14  Yes Yolanda Manges, DO  iron polysaccharides (NIFEREX) 150 MG capsule Take 1 capsule (150 mg total) by mouth daily. 06/01/14  Yes Yolanda Manges, DO  fludrocortisone (FLORINEF) 0.1 MG tablet Take 1 tablet (0.1 mg total) by mouth daily. Patient not  taking: Reported on 06/24/2014 06/01/14   Yolanda Manges, DO   BP 116/64 mmHg  Pulse 82  Temp(Src) 97.8 F (36.6 C) (Oral)  Resp 19  SpO2 100%  LMP 06/11/2014   Physical Exam  1840: Physical examination:  Nursing notes reviewed; Vital signs and O2 SAT reviewed;  Constitutional: Well developed, Well nourished, Well hydrated, In no acute distress; Head:  Normocephalic, atraumatic; Eyes: EOMI, PERRL, No scleral icterus; ENMT: Mouth and pharynx normal, Mucous membranes moist; Neck: Supple, Full range of motion, No lymphadenopathy; Cardiovascular: Regular rate and rhythm, No gallop; Respiratory: Breath sounds clear & equal bilaterally, No wheezes.  Speaking full sentences with ease, Normal respiratory effort/excursion; Chest: No deformity, Movement normal; Abdomen: Soft, Nontender, Nondistended, Normal bowel sounds. Rectal exam performed w/permission of pt and ED RN chaperone present.  Anal tone normal.  Non-tender, soft black stool in rectal vault, heme neg.  No fissures, no external hemorrhoids, no palp masses.; Genitourinary: No CVA tenderness; Extremities: Pulses normal, No tenderness, No edema, No calf edema or asymmetry.; Neuro: AA&Ox3, Major CN grossly intact.  Speech clear. No gross focal motor or sensory deficits in extremities.; Skin: Color normal, Warm, Dry.   ED Course  Procedures     EKG Interpretation   Date/Time:  Tuesday June 24 2014 15:43:01 EDT Ventricular Rate:  93 PR Interval:  120 QRS Duration: 84 QT Interval:  340 QTC Calculation: 422 R Axis:   85 Text Interpretation:  Normal sinus rhythm Normal ECG Baseline wander When  compared with ECG of 05/30/2014 No significant change was found Confirmed  by [facility]  MD, Nicholos Johns [phone]) on 06/24/2014 6:34:02 PM      MDM  MDM Reviewed: previous chart, nursing note and vitals Reviewed previous: labs and ECG Interpretation: labs, ECG and x-ray     Results for orders placed or performed during the hospital encounter of  06/24/14  CBC  Result Value Ref Range   WBC 6.4 4.0 - 10.5 K/uL   RBC 4.03 3.87 - 5.11 MIL/uL   Hemoglobin 11.2 (L) 12.0 - 15.0 g/dL   HCT 60.4 (L) 54.0 - 98.1 %   MCV 86.8 78.0 - 100.0 fL   MCH 27.8 26.0 - 34.0 pg   MCHC 32.0 30.0 - 36.0 g/dL   RDW 19.1 47.8 - 29.5 %   Platelets 226 150 - 400 K/uL  Basic metabolic panel  Result Value Ref Range   Sodium 139 135 - 145 mmol/L   Potassium 3.9 3.5 - 5.1 mmol/L   Chloride 107 96 - 112 mmol/L   CO2 23 19 - 32 mmol/L   Glucose, Bld 102 (H) 70 - 99 mg/dL   BUN 5 (L) 6 - 23 mg/dL   Creatinine, Ser 6.21 0.50 - 1.10 mg/dL   Calcium 9.6 8.4 - 30.8 mg/dL   GFR calc non Af Amer >90 >90 mL/min   GFR calc Af Amer >90 >90 mL/min   Anion gap 9 5 - 15  I-stat troponin, ED (not at [facility])  Result Value Ref Range  Troponin i, poc 0.00 0.00 - 0.08 ng/mL   Comment 3          POC occult blood, ED  Result Value Ref Range   Fecal Occult Bld NEGATIVE NEGATIVE   Dg Chest 2 View 06/24/2014   CLINICAL DATA:  Sharp, throbbing chest pain. Shortness of breath. Duration: 1 day. WPW syndrome.  EXAM: CHEST  2 VIEW  COMPARISON:  04/02/2014  FINDINGS: Stable appearance of loop recorder.  The lungs appear clear. Cardiac and mediastinal contours normal. No pleural effusion identified.  IMPRESSION: 1. No significant abnormality observed.   Electronically Signed   By: Gaylyn RongWalter  Liebkemann M.D.   On: 06/24/2014 17:16    1900:  Pt denies palpitations, no syncope. Doubt PE as cause for symptoms with low risk [place].  Doubt ACS as cause for symptoms with normal troponin and unchanged EKG from previous after 2 weeks of constant symptoms. [facility] Mliss Fritzawood, case discussed, including:  HPI, pertinent PM/SHx, VS/PE, dx testing, ED course and treatment:  Agrees with ED workup, requests to d/c pt and have her f/u in office. Dx and testing, as well as d[facility]w [facility], d/w pt.  Questions answered.  Verb understanding, agreeable to d/c home with outpt f/u.   2000:  ED  RN stood pt up from stretcher to d/c her and she then fell to the floor. Pt states she felt lightheaded and had palpitations before her very brief syncopal episode. Pt quickly returned to her baseline A&O, resps easy, neuro non-focal. Pt was able to stand and walk after this episode. Pt describes this episode as per her usual, recurrent, daily syncopal pattern. Will interrogate loop recorder, check CBG.   2150:  [employer] loop recorder interrogated: no events since 06/21/14 and that was a "bradycardic event."  Pt has chronic, recurrent, daily syncope, and today's event was c/w her previous events. Pt remains neurologically intact.  Pt has remained otherwise stable and wants to go home now. Dx and testing d/w pt. Questions answered.  Verb understanding, agreeable to d/c home with outpt f/u.   Samuel JesterKathleen Margareth Kanner, DO 06/26/14 [phone]

## 2014-06-24 NOTE — ED Notes (Signed)
Pt reports cp since yesterday, has cp at baseline but is worse today. Has Pott's Disease. Was feeling sob and nauseated. Also dark stools today.  Pt is a x 4

## 2014-06-24 NOTE — ED Notes (Signed)
Pt has a loop recorder 

## 2014-06-24 NOTE — Discharge Instructions (Signed)
°Emergency Department Resource Guide °1) Find a Doctor and Pay Out of Pocket °Although you won't have to find out who is covered by your insurance plan, it is a good idea to ask around and get recommendations. You will then need to call the office and see if the doctor you have chosen will accept you as a new patient and what types of options they offer for patients who are self-pay. Some doctors offer discounts or will set up payment plans for their patients who do not have insurance, but you will need to ask so you aren't surprised when you get to your appointment. ° °2) Contact Your Local Health Department °Not all health departments have doctors that can see patients for sick visits, but many do, so it is worth a call to see if yours does. If you don't know where your local health department is, you can check in your phone book. The CDC also has a tool to help you locate your state's health department, and many state websites also have listings of all of their local health departments. ° °3) Find a Walk-in Clinic °If your illness is not likely to be very severe or complicated, you may want to try a walk in clinic. These are popping up all over the country in pharmacies, drugstores, and shopping centers. They're usually staffed by nurse practitioners or physician assistants that have been trained to treat common illnesses and complaints. They're usually fairly quick and inexpensive. However, if you have serious medical issues or chronic medical problems, these are probably not your best option. ° °No Primary Care Doctor: °- Call [facility] Connect at  832-8000 - they can help you locate a primary care doctor that  accepts your insurance, provides certain services, etc. °- Physician Referral Service- 1-800-533-3463 ° °Chronic Pain Problems: °Organization         Address  Phone   Notes  °[facility]  (336) 297-2271 Patients need to be referred by their primary care doctor.  ° °Medication  Assistance: °Organization         Address  Phone   Notes  °[place] [facility] [address] °[place], [place] [address] (336) 641-8030 --Must be a resident of [place] °-- Must have NO insurance coverage whatsoever (no Medicaid/ Medicare, etc.) °-- The pt. MUST have a primary care doctor that directs their care regularly and follows them in the community °  °[facility]  (866) 331-1348   °[employer]  (888) 892-1162   ° °Agencies that provide inexpensive medical care: °Organization         Address  Phone   Notes  °[facility]  (336) 832-8035   °[facility]    (336) 832-7272   °[facility] [address] °[place], [place] [address] (336) 832-4777   °[facility] [address], °[place] (336) 271-4999   °[facility]    (336) 373-0678   °[facility]    (336) 272-1050   °[facility] ° [address], [place] Phone:  (336) 832-4444, Fax:  (336) 832-4440 Hours of Operation:  9 am - 6 pm, M-F.  Also accepts Medicaid/Medicare and self-pay.  °[facility] ° [address], [place] Phone: (336) 832-3150, Fax: (336) 832-3151. Hours of Operation:  8:30 am - 5:30 pm, M-F.  Also accepts Medicaid and self-pay.  °[facility] [place] [address], [place] Phone: (336) 878-6027   °[facility] [address], [place], [place] (336)723-1848, Ext. 123 Mondays & Thursdays: 7-9 AM.  First 15 patients are seen on a first come, first serve basis. °  ° °Medicaid-accepting [place] Providers: ° °Organization         Address  Phone   Notes  °[facility] [address], [place] (336) 641-2100 Also accepts self-pay patients.  °[facility] [address], [place] ° (336) 856-9996   °[facility] [address], [place]  (336) 288-8857   °[facility] [address] [place] [address], [place] (336) 299-7000   °Veita Bland [address], [place]  ° (336) 373-1557 Only accepts [place] Access Medicaid patients after they have their name applied to their card.  ° °Self-Pay (no insurance) in [place]: ° °Organization         Address  Phone   Notes  °Sickle Cell Patients, [facility] [address], [place] (336) 832-1970   °[facility] [address], [place] (336) 832-4400   °[facility] ° [address], [place] (336) 992-4800   °[facility]/Dr. Osei-Bonsu ° [address] [place] [address], [place] or [address], [place] (336) 841-8500 Phone number for both [place] and [place] locations is the same.  °[facility] [address], [place] (336) 299-0000   °[facility] [address] [place] [address], [place] or [address] (336) 852-7530 °(336) 878-2260   °[facility] [address], [place] (336) 350-1642, phone; (336) 294-5005, fax Sees patients 1st and 3rd Saturday of every month.  Must not qualify for public or private insurance (i.e. Medicaid, Medicare, [place] Health Choice, Veterans' Benefits) • Household income should be no more than 200% of the poverty level •The clinic cannot treat you if you are pregnant or think you are pregnant • Sexually transmitted diseases are not treated at the clinic.  ° ° °Dental Care: °Organization         Address  Phone  Notes  °[place] [facility] [address], [place] (336) 641-6152 Accepts children up to age 21 who are enrolled in Medicaid or [place] Health Choice; pregnant women with a Medicaid card; and children who have applied for Medicaid or [place] Health Choice, but were declined, whose parents can pay a reduced fee at time of service.  °[place]  [facility] [place]  [address], [place] (336) 641-7733 Accepts children up to age 21 who are enrolled in Medicaid or [place] Health Choice; pregnant women with a Medicaid card; and children who have applied for Medicaid or [place] Health Choice, but were declined, whose parents can pay a reduced fee at time of service.  °[facility] ° [address], [place] (336) 641-4533 Patients are seen by appointment only. Walk-ins are not accepted. [facility] will see patients 18 years of age and older. °Monday - Tuesday (8am-5pm) °Most Wednesdays (8:30-5pm) °$30 per visit, cash only  °[facility] ° [address], [place] (336) 641-4533 Patients are seen by appointment only. Walk-ins are not accepted. [facility] will see patients 18 years of age and older. °One   Wednesday Evening (Monthly: Volunteer Based).  $30 per visit, cash only  °[facility]  (919) 537-3737 for adults; Children under age 4, call Graduate Pediatric Dentistry at (919) [phone]. Children aged 4-14, please call (919) [phone] to request a pediatric application. ° Dental services are provided in all areas of dental care including fillings, crowns and bridges, complete and partial dentures, implants, gum treatment, root canals, and extractions. Preventive care is also provided. Treatment is provided to both adults and children. °Patients are selected via a lottery and there is often a waiting list. °  °[facility] [address], °[place] ° (336) 763-8833 [email or web address] °  °[facility] [address], [place], [place] (336)723-1848, Ext. 123 Second and Fourth Thursday of each month, opens at 6:30 AM; Clinic ends at 9 AM.  Patients are seen on a first-come first-served basis, and a limited number are seen during each clinic.  ° °[facility] ° [address], [place], [place] (336) 723-7904    Eligibility Requirements °You must have lived in [place], [place], or [place] counties for at least the last three months. °  You cannot be eligible for state or federal sponsored healthcare insurance, including Veterans Administration, Medicaid, or Medicare. °  You generally cannot be eligible for healthcare insurance through your employer.  °  How to apply: °Eligibility screenings are held every Tuesday and Wednesday afternoon from 1:00 pm until 4:00 pm. You do not need an appointment for the interview!  °[facility] [address], [place], [place] 336-631-2330   °[facility]  336-342-8273   °[place]  336-703-3100   °[facility]  336-570-6415   ° °Behavioral Health Resources in the Community: °Intensive Outpatient Programs °Organization         Address  Phone  Notes  °[facility] [address], [place], [place] 336-878-6098   °[facility] [address], [place], [place] 336-832-9800   °[facility] [address], [place], [place] ° 336-882-2125   °[facility] [address],  °[place], [place] 1-800-853-5163 or 336-641-4981   °Substance Abuse Resources °Organization         Address  Phone  Notes  °[facility]  336-882-2125   °[facility]  336-784-9470   °[facility]  336-285-9073   °[facility]  336-845-3988   °Residential & Outpatient Substance Abuse Program  1-800-659-3381   °Psychological Services °Organization         Address  Phone  Notes  °[facility]  336- 832-9600   °[facility]  336- 378-7881   °[facility] [address], [place] 1-800-853-5163 or 336-641-4981   ° °Mobile Crisis Teams °Organization         Address  Phone  Notes  °[facility], Mobile Crisis Care Unit  1-877-626-1772   °Assertive °[facility] ° [address]  [place], [place] 336-834-9664   °Sharon DeEsch [address] °[place] 336-554-5454   ° °Self-Help/Support Groups °Organization         Address  Phone             Notes  °[facility] of  - variety of support groups  336- 373-1402 Call for more information  °Narcotics Anonymous (NA), [facility] [address], °[place]  2 meetings at this location  ° °  Residential Treatment Programs Organization         Address  Phone  Notes  [facility] [address],    [place]  [phone]   [facility]  [address], [place] [id number], [place], [place] [phone]   [facility] [address], [place] [phone] Admissions: 8am-3pm M-F  [facility] [address]    [place], [place] [phone]   [facility] [address], [place], [place] [phone]   [facility] [address]  [place], [place] [phone]   [facility] Intensive Outpatient [address], [place], [place] [phone]   [facility] ([facility] [address]  [place], [place] [phone])[phone] or [phone]   [facility] ([facility] [address] [place], [place] [phone] Accepts Medicaid  [facility] [address]  [place] [phone] Substance Abuse/Addiction Treatment   [facility]         Address  Phone  Notes  [facility]  [phone](888) [phone]   Angie FavaJulie Brannon, PhD [address] [place], [place]   [phone](336) [phone] or [phone](336) [phone]   [facility]   [address] [place], [place] [phone](336) [phone]   [facility] [address], [place], [place] ([phone]) [phone](336) [phone] Insurance/Medicaid/sponsorship through [facility] [address]                                    [place], [place] [phone](336) [phone] Therapy/tele-psych/case    [facility] [address]   [place], [place] [phone](336) [phone]    Dr. Lolly MustacheArfeen  [phone](336) [phone]   [facility]  [employer] [facility] 1) [address], [place] 2) [address], [place] 3)  [address], [place] [phone](336) [phone](336) [phone]  [phone](336) [phone]   [facility] Child Abuse Hotline [phone](336) [phone] or 6705090877(336) [phone] (After Hours)      Take your usual prescriptions as previously directed. Apply moist heat or ice to the area(s) of discomfort, for 15 minutes at a time, several times per day for the next few days.  Do not fall asleep on a heating or ice pack.  Call your regular Cardiologist tomorrow morning to schedule a follow up appointment in the next 3 days.  Return to the Emergency Department immediately if worsening.

## 2014-06-25 ENCOUNTER — Encounter (HOSPITAL_COMMUNITY): Payer: Self-pay | Admitting: Emergency Medicine

## 2014-06-25 ENCOUNTER — Observation Stay (HOSPITAL_COMMUNITY)
Admission: EM | Admit: 2014-06-25 | Discharge: 2014-06-27 | Disposition: A | Discharge status: Home / Self Care | Payer: Self-pay | Attending: Cardiovascular Disease | Admitting: Cardiovascular Disease

## 2014-06-25 DIAGNOSIS — R110 Nausea: Secondary | ICD-10-CM | POA: Insufficient documentation

## 2014-06-25 DIAGNOSIS — D500 Iron deficiency anemia secondary to blood loss (chronic): Secondary | ICD-10-CM | POA: Insufficient documentation

## 2014-06-25 DIAGNOSIS — R55 Syncope and collapse: Principal | ICD-10-CM | POA: Insufficient documentation

## 2014-06-25 DIAGNOSIS — R079 Chest pain, unspecified: Secondary | ICD-10-CM | POA: Insufficient documentation

## 2014-06-25 DIAGNOSIS — I456 Pre-excitation syndrome: Secondary | ICD-10-CM | POA: Insufficient documentation

## 2014-06-25 DIAGNOSIS — R51 Headache: Secondary | ICD-10-CM | POA: Insufficient documentation

## 2014-06-25 LAB — CBC WITH DIFFERENTIAL/PLATELET
BASOS PCT: 1 % (ref 0–1)
Basophils Absolute: 0 10*3/uL (ref 0.0–0.1)
Eosinophils Absolute: 0.1 10*3/uL (ref 0.0–0.7)
Eosinophils Relative: 2 % (ref 0–5)
HEMATOCRIT: 36.6 % (ref 36.0–46.0)
HEMOGLOBIN: 11.5 g/dL — AB (ref 12.0–15.0)
LYMPHS PCT: 50 % — AB (ref 12–46)
Lymphs Abs: 4.1 10*3/uL — ABNORMAL HIGH (ref 0.7–4.0)
MCH: 27.7 pg (ref 26.0–34.0)
MCHC: 31.4 g/dL (ref 30.0–36.0)
MCV: 88.2 fL (ref 78.0–100.0)
MONO ABS: 0.6 10*3/uL (ref 0.1–1.0)
MONOS PCT: 7 % (ref 3–12)
Neutro Abs: 3.2 10*3/uL (ref 1.7–7.7)
Neutrophils Relative %: 40 % — ABNORMAL LOW (ref 43–77)
Platelets: 245 10*3/uL (ref 150–400)
RBC: 4.15 MIL/uL (ref 3.87–5.11)
RDW: 12.1 % (ref 11.5–15.5)
WBC: 8.1 10*3/uL (ref 4.0–10.5)

## 2014-06-25 LAB — CBG MONITORING, ED: Glucose-Capillary: 80 mg/dL (ref 70–99)

## 2014-06-25 MED ORDER — SODIUM CHLORIDE 0.9 % IV BOLUS (SEPSIS)
1000.0000 mL | Freq: Once | INTRAVENOUS | Status: AC
Start: 2014-06-25 — End: 2014-06-26
  Administered 2014-06-25: 1000 mL via INTRAVENOUS

## 2014-06-25 MED ORDER — ACETAMINOPHEN 500 MG PO TABS
1000.0000 mg | ORAL_TABLET | Freq: Once | ORAL | Status: AC
  Administered 2014-06-26: 1000 mg via ORAL
  Filled 2014-06-25: qty 2

## 2014-06-25 NOTE — ED Notes (Signed)
It is believed pt passed out for about ten minutes, pt reports this happens to her almost every day.

## 2014-06-25 NOTE — ED Provider Notes (Signed)
CSN: [id number]     Arrival date & time 06/25/14  2256 History   First MD Initiated Contact with Patient 06/25/14 2311     This chart was scribed for Tomasita Crumble, MD by Arlan Organ, ED Scribe. This patient was seen in room D33C/D33C and the patient's care was started 11:36 PM.   Chief Complaint  Patient presents with  . Loss of Consciousness   The history is provided by the patient. No language interpreter was used.    HPI Comments: Desiree Gutierrez brought in by EMS is a 24 y.o. female with a PMHx of WPW and current implanted medtronic cardiac monitor-loop recorder placed 01/2014 who presents to the Emergency Department here for loss of consciousness just prior to arrival. Pts friend called EMS after pt told her she felt as though she could not breathe. Friend was still on the phone with pt for a short period of time and states she did not say anything for a number of minutes followed by a "thump" as if the pt had fallen to the ground. Upon EMS arrival, pt was found on the ground. Per EMS, pt was lethargic for approximately 5 minutes prior to arrival to ED. CBG 80 upon arrival to department. Pt states this is not new for her as she typically experiences syncopal episodes almost every day. During this evenings episode, pt admits to an ongoing HA x 2 days throughout the day along with nausea which are both still persistent at this time. No recent fever or chills. Desiree Gutierrez reports eating a full meal today along with taking in plenty of fluids in hopes of preventing any dehydration. Pt is followed [facility] who she recently followed up with 2 weeks ago. During visit, pt had a syncopal episode in office. At that time episode lasted 13 minutes. She was given glucose and started on fluids. It is reported that she was disoriented and blood sugar was in the low 50's. Desiree Gutierrez was recently started on Floranef, however, her cardiologist discontinued this medication after she developed an  allergic reaction. However, she is taking iron pill supplement daily.   Past Medical History  Diagnosis Date  . WPW (Wolff-Parkinson-White syndrome)     a. s/p ablation (2011) Dr Gevena Cotton at [facility] (posterior lateral pathway)  . Vasovagal syncope     a. positive tilt 2011 b. failed medical therapy with Florinef, Midodrine, Inderal, Celexa  . POTS (postural orthostatic tachycardia syndrome)   . Palpitations     a. s/p MDT ILR implanted by Dr Alfonso Ellis [facility])  . Headache   . Syncope     recurrent, daily   Past Surgical History  Procedure Laterality Date  . Cardiac electrophysiology study and ablation  2011    Dr. [facility] at [facility]- left posterior pathway ablation for WPW  . Tilt table study  2011    neurally mediated syncope  . Loop recorder implant  01/22/2014    MDT LINQ implanted by Dr Alfonso Ellis at [facility] for evaluation of palpitations/syncope   Family History  Problem Relation Age of Onset  . Diabetes Mother   . Hypertension Mother   . Stroke Mother   . Hypertension Brother    History  Substance Use Topics  . Smoking status: Never Smoker   . Smokeless tobacco: Not on file  . Alcohol Use: No   OB History    No data available     Review of Systems   A complete 10 system review of  systems was obtained and all systems are negative except as noted in the HPI and PMH.     Allergies  Amitiza; Ketorolac tromethamine; Metoclopramide; and Midodrine  Home Medications   Prior to Admission medications   Medication Sig Start Date End Date Taking? Authorizing Provider  Elastic Bandages & Supports (MEDICAL COMPRESSION STOCKINGS) MISC 1 each by Does not apply route daily. 06/01/14   Yolanda Manges, DO  fludrocortisone (FLORINEF) 0.1 MG tablet Take 1 tablet (0.1 mg total) by mouth daily. Patient not taking: Reported on 06/24/2014 06/01/14   Yolanda Manges, DO  iron polysaccharides (NIFEREX) 150 MG capsule Take 1 capsule (150 mg total) by mouth daily. 06/01/14   Yolanda Manges, DO     Triage Vitals: BP 113/71 mmHg  Pulse 80  Temp(Src) 98.6 F (37 C) (Oral)  Resp 20  Ht  (1.6 m)  Wt 132 lb (59.875 kg)  BMI 23.39 kg/m2  SpO2 100%  LMP 06/18/2014   Physical Exam  Constitutional: She is oriented to person, place, and time. She appears well-developed and well-nourished. No distress.  HENT:  Head: Normocephalic and atraumatic.  Eyes: EOM are normal.  Neck: Normal range of motion.  Cardiovascular: Normal rate and normal heart sounds.  An irregular rhythm present.  Pulmonary/Chest: Effort normal and breath sounds normal.  Abdominal: Soft. She exhibits no distension. There is no tenderness.  Musculoskeletal: Normal range of motion.  Neurological: She is alert and oriented to person, place, and time.  Skin: Skin is warm and dry.  Psychiatric: She has a normal mood and affect. Judgment normal.  Nursing note and vitals reviewed.   ED Course  Procedures (including critical care time)  DIAGNOSTIC STUDIES: Oxygen Saturation is 100% on RA, Normal by my interpretation.    COORDINATION OF CARE: 11:13 PM-Discussed treatment plan with pt at bedside and pt agreed to plan.     Labs Review Labs Reviewed  CBC WITH DIFFERENTIAL/PLATELET - Abnormal; Notable for the following:    Hemoglobin 11.5 (*)    Neutrophils Relative % 40 (*)    Lymphocytes Relative 50 (*)    Lymphs Abs 4.1 (*)    All other components within normal limits  BASIC METABOLIC PANEL  MAGNESIUM  TSH  T4, FREE  TROPONIN I  TROPONIN I  TROPONIN I  CBG MONITORING, ED  I-STAT BETA HCG BLOOD, ED ([facility], [facility], [facility] ONLY)  I-STAT TROPOININ, ED    Imaging Review Dg Chest 2 View  06/24/2014   CLINICAL DATA:  Sharp, throbbing chest pain. Shortness of breath. Duration: 1 day. WPW syndrome.  EXAM: CHEST  2 VIEW  COMPARISON:  04/02/2014  FINDINGS: Stable appearance of loop recorder.  The lungs appear clear. Cardiac and mediastinal contours normal. No pleural effusion identified.  IMPRESSION: 1. No  significant abnormality observed.   Electronically Signed   By: Gaylyn Rong M.D.   On: 06/24/2014 17:16     EKG Interpretation   Date/Time:  Wednesday June 25 2014 23:45:33 EDT Ventricular Rate:  79 PR Interval:  133 QRS Duration: 78 QT Interval:  364 QTC Calculation: 417 R Axis:   76 Text Interpretation:  Sinus arrhythmia ST elevation, consider inferior  injury Confirmed by Erroll Luna [phone]) on 06/25/2014 11:55:40 PM      MDM   Final diagnoses:  None    Patient presents emergency department for recurrent syncope. She does have a history of WPW and POTS but is not currently on any medication. She states her syncopal  episodes occur almost every day. Emergency department workup is negative outside of a sinus arrhythmia that appears to be new. We'll consult with cardiology regarding the patient's disposition. [facility].  Cartilage was consulted for evaluation of the patient. Dr. Algie CofferKadakia will admit the patient to the hospital for further workup. Patient has no further episodes of syncope in the emergency department.  I personally performed the services described in this documentation, which was scribed in my presence. The recorded information has been reviewed and is accurate.   Tomasita CrumbleAdeleke Lenn Volker, MD 06/26/14 [phone]

## 2014-06-25 NOTE — ED Notes (Signed)
Pt CBG, 80. Nurse was notified. 

## 2014-06-25 NOTE — ED Notes (Signed)
Pt c/o L sided chest pain worsening tonight associated with shortness of breath. Pt was on the telephone with a friend reporting feeling lightheaded, friend heard a thud, with the assumption pt may have passed out. Pt c/o headache. Hx of WPW with loop recorder placed October 2015. Reports frequent episodes of feeling faint at home associated with sharp L sided chest pains and increased shortness of breath. [facility] which she attempted to contact today without success. Pt also reports having difficulty maintaining blood pressure and blood glucose.

## 2014-06-25 NOTE — ED Notes (Signed)
PT is from home, pt's friend called EMS after pt called her friend and told her she felt like she couldn't breath and then the friend reports the pt did not say anything else except she heard a thump as if the pt had fallen. EMS arrived pt was on the ground prone. Pt has a hx of WPW, pt has an implanted medtronic cardiac monitor implanted. EMS reports the pt was lethargic for the first 5 minutes upon their arrival. CBG 80 upon arrival to deparment. A&O X4 upon initial assessment in department.

## 2014-06-26 ENCOUNTER — Emergency Department (HOSPITAL_COMMUNITY): Payer: Medicaid Other

## 2014-06-26 LAB — CBC
HEMATOCRIT: 30.5 % — AB (ref 36.0–46.0)
Hemoglobin: 9.8 g/dL — ABNORMAL LOW (ref 12.0–15.0)
MCH: 28 pg (ref 26.0–34.0)
MCHC: 32.1 g/dL (ref 30.0–36.0)
MCV: 87.1 fL (ref 78.0–100.0)
PLATELETS: 199 10*3/uL (ref 150–400)
RBC: 3.5 MIL/uL — ABNORMAL LOW (ref 3.87–5.11)
RDW: 12.2 % (ref 11.5–15.5)
WBC: 5.4 10*3/uL (ref 4.0–10.5)

## 2014-06-26 LAB — BASIC METABOLIC PANEL
ANION GAP: 8 (ref 5–15)
ANION GAP: 7 (ref 5–15)
BUN: 10 mg/dL (ref 6–23)
BUN: 10 mg/dL (ref 6–23)
CHLORIDE: 105 mmol/L (ref 96–112)
CHLORIDE: 106 mmol/L (ref 96–112)
CO2: 25 mmol/L (ref 19–32)
CO2: 25 mmol/L (ref 19–32)
Calcium: 8.8 mg/dL (ref 8.4–10.5)
Calcium: 9.9 mg/dL (ref 8.4–10.5)
Creatinine, Ser: 0.75 mg/dL (ref 0.50–1.10)
Creatinine, Ser: 0.86 mg/dL (ref 0.50–1.10)
GFR calc Af Amer: >90 mL/min (ref >90)
GFR calc Af Amer: >90 mL/min (ref >90)
GFR calc non Af Amer: >90 mL/min (ref >90)
GFR calc non Af Amer: >90 mL/min (ref >90)
Glucose, Bld: 82 mg/dL (ref 70–99)
Glucose, Bld: 96 mg/dL (ref 70–99)
POTASSIUM: 3.6 mmol/L (ref 3.5–5.1)
Potassium: 4 mmol/L (ref 3.5–5.1)
Sodium: 138 mmol/L (ref 135–145)
Sodium: 138 mmol/L (ref 135–145)

## 2014-06-26 LAB — TROPONIN I

## 2014-06-26 LAB — T4, FREE: Free T4: 1.32 ng/dL (ref 0.80–1.80)

## 2014-06-26 LAB — I-STAT BETA HCG BLOOD, ED (MC, WL, AP ONLY): I-stat hCG, quantitative: <5 m[IU]/mL (ref <5)

## 2014-06-26 LAB — I-STAT TROPONIN, ED: Troponin i, poc: 0 ng/mL (ref 0.00–0.08)

## 2014-06-26 LAB — MAGNESIUM: Magnesium: 2.2 mg/dL (ref 1.5–2.5)

## 2014-06-26 LAB — TSH: TSH: 3.901 u[IU]/mL (ref 0.350–4.500)

## 2014-06-26 MED ORDER — ALUM & MAG HYDROXIDE-SIMETH 200-200-20 MG/5ML PO SUSP: 30.0000 mL | Freq: Four times a day (QID) | ORAL | Status: DC | PRN

## 2014-06-26 MED ORDER — DOCUSATE SODIUM 100 MG PO CAPS
100.0000 mg | ORAL_CAPSULE | Freq: Two times a day (BID) | ORAL | Status: DC
  Administered 2014-06-26 – 2014-06-27 (×3): 100 mg via ORAL
  Filled 2014-06-26 (×3): qty 1

## 2014-06-26 MED ORDER — POLYSACCHARIDE IRON COMPLEX 150 MG PO CAPS
150.0000 mg | ORAL_CAPSULE | Freq: Every day | ORAL | Status: DC
  Administered 2014-06-26 – 2014-06-27 (×2): 150 mg via ORAL
  Filled 2014-06-26 (×2): qty 1

## 2014-06-26 MED ORDER — ADULT MULTIVITAMIN W/MINERALS CH
1.0000 | ORAL_TABLET | Freq: Every day | ORAL | Status: DC
  Administered 2014-06-26 – 2014-06-27 (×2): 1 via ORAL
  Filled 2014-06-26 (×2): qty 1

## 2014-06-26 MED ORDER — ACETAMINOPHEN 650 MG RE SUPP: 650.0000 mg | Freq: Four times a day (QID) | RECTAL | Status: DC | PRN

## 2014-06-26 MED ORDER — HEPARIN SODIUM (PORCINE) 5000 UNIT/ML IJ SOLN
5000.0000 [IU] | Freq: Three times a day (TID) | INTRAMUSCULAR | Status: DC
  Filled 2014-06-26 (×5): qty 1

## 2014-06-26 MED ORDER — SODIUM CHLORIDE 0.9 % IJ SOLN
3.0000 mL | Freq: Two times a day (BID) | INTRAMUSCULAR | Status: DC
  Administered 2014-06-26: 3 mL via INTRAVENOUS

## 2014-06-26 MED ORDER — SODIUM CHLORIDE 0.9 % IV SOLN: 250.0000 mL | INTRAVENOUS | Status: DC | PRN

## 2014-06-26 MED ORDER — ONDANSETRON HCL 4 MG/2ML IJ SOLN: 4.0000 mg | Freq: Four times a day (QID) | INTRAMUSCULAR | Status: DC | PRN

## 2014-06-26 MED ORDER — SODIUM CHLORIDE 0.9 % IJ SOLN: 3.0000 mL | INTRAMUSCULAR | Status: DC | PRN

## 2014-06-26 MED ORDER — ONDANSETRON HCL 4 MG PO TABS: 4.0000 mg | ORAL_TABLET | Freq: Four times a day (QID) | ORAL | Status: DC | PRN

## 2014-06-26 MED ORDER — ACETAMINOPHEN 325 MG PO TABS: 650.0000 mg | ORAL_TABLET | Freq: Four times a day (QID) | ORAL | Status: DC | PRN

## 2014-06-26 MED ORDER — SODIUM CHLORIDE 0.9 % IJ SOLN
3.0000 mL | Freq: Two times a day (BID) | INTRAMUSCULAR | Status: DC
Start: 2014-06-26 — End: 2014-06-27
  Administered 2014-06-26 – 2014-06-27 (×3): 3 mL via INTRAVENOUS

## 2014-06-26 NOTE — H&P (Signed)
Referring Physician:  Akhila Mahnken is an 24 y.o. female.                       Chief Complaint: Syncope  HPI: 24 y.o. female with a PM Hx of WPW and current implanted medtronic cardiac monitor-loop recorder placed 01/2014 presented to the Emergency Department here for loss of consciousness just prior to arrival. Pt's friend called EMS after pt told her she felt as though she could not breathe. Friend was still on the phone with pt for a short period of time and states she did not say anything for a number of minutes followed by a "thump" as if the pt had fallen to the ground. Upon EMS arrival, pt was found on the ground. Per EMS, pt was lethargic for approximately 5 minutes prior to arrival to ED. CBG 80 upon arrival to department. Pt states this is not new for her as she typically experiences syncopal episodes almost every day. During this evenings episode, pt admits to an ongoing HA x 2 days throughout the day along with nausea which are both still persistent at this time. No recent fever or chills. Ms. Fishman reports eating a full meal today along with taking in plenty of fluids in hopes of preventing any dehydration. Pt is followed by a Cardiologist at [facility] who she recently followed up with 2 weeks ago. During visit, pt had a syncopal episode in office. At that time episode lasted 13 minutes. She was given glucose and started on fluids. It is reported that she was disoriented and blood sugar was in the low 50's. Ms. Games was recently started on Florinef, however, her cardiologist discontinued this medication after she developed an allergic reaction. However, she is taking iron pill supplement daily. She also has sharp left sided chest pain increased with breathing. She claims to have reactions to florinef and intolerance to hydrocortisone. Neurology work up is pending at [facility].  Past Medical History  Diagnosis Date  . WPW (Wolff-Parkinson-White syndrome)     a. s/p ablation (2011) Dr  Thomasenia Sales at [facility] (posterior lateral pathway)  . Vasovagal syncope     a. positive tilt 2011 b. failed medical therapy with Florinef, Midodrine, Inderal, Celexa  . POTS (postural orthostatic tachycardia syndrome)   . Palpitations     a. s/p MDT ILR implanted by Dr Gelene Mink [facility])  . Headache   . Syncope     recurrent, daily      Past Surgical History  Procedure Laterality Date  . Cardiac electrophysiology study and ablation  2011    Dr. Thomasenia Sales at [facility]- left posterior pathway ablation for WPW  . Tilt table study  2011    neurally mediated syncope  . Loop recorder implant  01/22/2014    MDT LINQ implanted by Dr Gelene Mink at [facility] for evaluation of palpitations/syncope    Family History  Problem Relation Age of Onset  . Diabetes Mother   . Hypertension Mother   . Stroke Mother   . Hypertension Brother    Social History:  reports that she has never smoked. She does not have any smokeless tobacco history on file. She reports that she does not drink alcohol or use illicit drugs.  Allergies:  Allergies  Allergen Reactions  . Amitiza [Lubiprostone] Anaphylaxis, Shortness Of Breath and Swelling  . Ketorolac Tromethamine Other (See Comments)    Heart raced  . Metoclopramide Other (See Comments)    Heart raced  . Midodrine Other (  See Comments)    migraines     (Not in a hospital admission)  Results for orders placed or performed during the hospital encounter of 06/25/14 (from the past 48 hour(s))  CBG monitoring, ED     Status: None   Collection Time: 06/25/14 11:03 PM  Result Value Ref Range   Glucose-Capillary 80 70 - 99 mg/dL  CBC with Differential/Platelet     Status: Abnormal   Collection Time: 06/25/14 11:34 PM  Result Value Ref Range   WBC 8.1 4.0 - 10.5 K/uL   RBC 4.15 3.87 - 5.11 MIL/uL   Hemoglobin 11.5 (L) 12.0 - 15.0 g/dL   HCT 36.6 36.0 - 46.0 %   MCV 88.2 78.0 - 100.0 fL   MCH 27.7 26.0 - 34.0 pg   MCHC 31.4 30.0 - 36.0 g/dL   RDW 12.1 11.5 - 15.5 %    Platelets 245 150 - 400 K/uL   Neutrophils Relative % 40 (L) 43 - 77 %   Neutro Abs 3.2 1.7 - 7.7 K/uL   Lymphocytes Relative 50 (H) 12 - 46 %   Lymphs Abs 4.1 (H) 0.7 - 4.0 K/uL   Monocytes Relative 7 3 - 12 %   Monocytes Absolute 0.6 0.1 - 1.0 K/uL   Eosinophils Relative 2 0 - 5 %   Eosinophils Absolute 0.1 0.0 - 0.7 K/uL   Basophils Relative 1 0 - 1 %   Basophils Absolute 0.0 0.0 - 0.1 K/uL  Basic metabolic panel     Status: None   Collection Time: 06/25/14 11:34 PM  Result Value Ref Range   Sodium 138 135 - 145 mmol/L   Potassium 4.0 3.5 - 5.1 mmol/L   Chloride 106 96 - 112 mmol/L   CO2 25 19 - 32 mmol/L   Glucose, Bld 82 70 - 99 mg/dL   BUN 10 6 - 23 mg/dL   Creatinine, Ser 0.86 0.50 - 1.10 mg/dL   Calcium 9.9 8.4 - 10.5 mg/dL   GFR calc non Af Amer >90 >90 mL/min   GFR calc Af Amer >90 >90 mL/min    Comment: (NOTE) The eGFR has been calculated using the CKD EPI equation. This calculation has not been validated in all clinical situations. eGFR's persistently <90 mL/min signify possible Chronic Kidney Disease.    Anion gap 7 5 - 15  Magnesium     Status: None   Collection Time: 06/25/14 11:34 PM  Result Value Ref Range   Magnesium 2.2 1.5 - 2.5 mg/dL  I-Stat Beta hCG blood, ED ([facility], [facility], [facility] only)     Status: None   Collection Time: 06/25/14 11:50 PM  Result Value Ref Range   I-stat hCG, quantitative <5.0 <5 mIU/mL   Comment 3            Comment:   GEST. AGE      CONC.  (mIU/mL)   <=1 WEEK        5 - 50     2 WEEKS       50 - 500     3 WEEKS       100 - 10,000     4 WEEKS     1,000 - 30,000        FEMALE AND NON-PREGNANT FEMALE:     LESS THAN 5 mIU/mL   TSH     Status: None   Collection Time: 06/25/14 11:59 PM  Result Value Ref Range   TSH 3.901 0.350 - 4.500 uIU/mL  I-stat troponin, ED     Status: None   Collection Time: 06/25/14 11:59 PM  Result Value Ref Range   Troponin i, poc 0.00 0.00 - 0.08 ng/mL   Comment 3            Comment: Due to the release  kinetics of cTnI, a negative result within the first hours of the onset of symptoms does not rule out myocardial infarction with certainty. If myocardial infarction is still suspected, repeat the test at appropriate intervals.    Dg Chest 2 View  06/26/2014   CLINICAL DATA:  Acute onset of worsening left-sided chest pain, with shortness of breath. Question of syncope. Initial encounter.  EXAM: CHEST  2 VIEW  COMPARISON:  Chest radiograph performed 06/24/2014  FINDINGS: The lungs are well-aerated and clear. There is no evidence of focal opacification, pleural effusion or pneumothorax.  The heart is normal in size; the mediastinal contour is within normal limits. No acute osseous abnormalities are seen. A metallic device is noted overlying the mediastinum.  IMPRESSION: No acute cardiopulmonary process seen.   Electronically Signed   By: Garald Balding M.D.   On: 06/26/2014 01:12   Dg Chest 2 View  06/24/2014   CLINICAL DATA:  Sharp, throbbing chest pain. Shortness of breath. Duration: 1 day. WPW syndrome.  EXAM: CHEST  2 VIEW  COMPARISON:  04/02/2014  FINDINGS: Stable appearance of loop recorder.  The lungs appear clear. Cardiac and mediastinal contours normal. No pleural effusion identified.  IMPRESSION: 1. No significant abnormality observed.   Electronically Signed   By: Van Clines M.D.   On: 06/24/2014 17:16    Review Of Systems   Blood pressure 112/76, pulse 89, temperature 98.6 F (37 C), temperature source Oral, resp. rate 22, height $RemoveBe'5\' 3"'WVTIgMfAs$  (1.6 m), weight 59.875 kg (132 lb), last menstrual period 06/18/2014, SpO2 100 %.  Physical Exam  Constitutional: She appears well-developed and well-nourished. No distress.  HENT: Normocephalic and atraumatic. Brown eyes, conjunctiva-pale : EOM are normal.  Neck: Normal range of motion.  Cardiovascular: Normal rate and normal heart sounds.III/VI systolic murmur.  Pulmonary/Chest: Effort normal and breath sounds normal.  Abdominal: Soft.  She exhibits no distension. There is no tenderness.  Musculoskeletal: Normal range of motion.  Neurological: She is alert and oriented to person, place, and time. Moves all four extremities. Skin: Skin is warm and dry.  Psychiatric: She has a normal mood and affect. Judgment normal.  Nursing note and vitals reviewed.  Assessment/Plan Syncope WPW s/p ablation Chest pain  Place in observation.  Birdie Riddle, MD  06/26/2014, 1:46 AM

## 2014-06-26 NOTE — Progress Notes (Signed)
  Echocardiogram 2D Echocardiogram has been performed.  Desiree Gutierrez, Desiree Gutierrez 06/26/2014, 3:25 PM

## 2014-06-26 NOTE — ED Notes (Signed)
Cardiology at bedside.

## 2014-06-26 NOTE — Progress Notes (Signed)
UR completed 

## 2014-06-26 NOTE — Progress Notes (Signed)
Ref: No PCP Per Patient   Subjective:  Feeling better. No chest pain. Afebrile. Hgb Low at 9.8. H/O Heavy monthly periods and had dark stools few days back. Echocardiogram showed normal LV systolic function, EF 60 % with mild TR. H/O iron deficiency in past.  Objective:  Vital Signs in the last 24 hours: Temp:  [98.6 F (37 C)-98.7 F (37.1 C)] 98.7 F (37.1 C) (03/24 0246) Pulse Rate:  [72-93] 93 (03/24 1429) Cardiac Rhythm:  [-] Normal sinus rhythm (03/24 0800) Resp:  [12-22] 18 (03/24 1429) BP: (107-122)/(64-84) 112/84 mmHg (03/24 1429) SpO2:  [98 %-100 %] 100 % (03/24 1429) Weight:  [59.875 kg (132 lb)-61.462 kg (135 lb 8 oz)] 61.462 kg (135 lb 8 oz) (03/24 0246)  Physical Exam: BP Readings from Last 1 Encounters:  06/26/14 112/84    Wt Readings from Last 1 Encounters:  06/26/14 61.462 kg (135 lb 8 oz)    Weight change:   HEENT: [place]/AT, Eyes-Brown, PERL, EOMI, Conjunctiva-Pale pink, Sclera-Non-icteric Neck: No JVD, No bruit, Trachea midline. Lungs:  Clear, Bilateral. Cardiac:  Regular rhythm, normal S1 and S2, no S3. III/VI systolic murmur Abdomen:  Soft, non-tender. Extremities:  No edema present. No cyanosis. No clubbing. CNS: AxOx3, Cranial nerves grossly intact, moves all 4 extremities.  Skin: Warm and dry.   Intake/Output from previous day: 03/23 0701 - 03/24 0700 In: 1000 [I.V.:1000] Out: -     Lab Results: BMET    Component Value Date/Time   NA 138 06/26/2014 0804   NA 138 06/25/2014 2334   NA 139 06/24/2014 1553   K 3.6 06/26/2014 0804   K 4.0 06/25/2014 2334   K 3.9 06/24/2014 1553   CL 105 06/26/2014 0804   CL 106 06/25/2014 2334   CL 107 06/24/2014 1553   CO2 25 06/26/2014 0804   CO2 25 06/25/2014 2334   CO2 23 06/24/2014 1553   GLUCOSE 96 06/26/2014 0804   GLUCOSE 82 06/25/2014 2334   GLUCOSE 102* 06/24/2014 1553   BUN 10 06/26/2014 0804   BUN 10 06/25/2014 2334   BUN 5* 06/24/2014 1553   CREATININE 0.75 06/26/2014 0804   CREATININE 0.86  06/25/2014 2334   CREATININE 0.78 06/24/2014 1553   CALCIUM 8.8 06/26/2014 0804   CALCIUM 9.9 06/25/2014 2334   CALCIUM 9.6 06/24/2014 1553   GFRNONAA >90 06/26/2014 0804   GFRNONAA >90 06/25/2014 2334   GFRNONAA >90 06/24/2014 1553   GFRAA >90 06/26/2014 0804   GFRAA >90 06/25/2014 2334   GFRAA >90 06/24/2014 1553   CBC    Component Value Date/Time   WBC 5.4 06/26/2014 0804   RBC 3.50* 06/26/2014 0804   RBC 4.06 05/30/2014 0844   HGB 9.8* 06/26/2014 0804   HCT 30.5* 06/26/2014 0804   PLT 199 06/26/2014 0804   MCV 87.1 06/26/2014 0804   MCH 28.0 06/26/2014 0804   MCHC 32.1 06/26/2014 0804   RDW 12.2 06/26/2014 0804   LYMPHSABS 4.1* 06/25/2014 2334   MONOABS 0.6 06/25/2014 2334   EOSABS 0.1 06/25/2014 2334   BASOSABS 0.0 06/25/2014 2334   HEPATIC Function Panel  Recent Labs  03/04/14 1221 03/12/14 1423 04/02/14 1747  PROT 7.9 7.4 7.6   HEMOGLOBIN A1C No components found for: HGA1C,  MPG CARDIAC ENZYMES Lab Results  Component Value Date   CKTOTAL 66 10/11/2010   CKMB 0.9 10/11/2010   TROPONINI <0.03 06/26/2014   TROPONINI <0.03 06/26/2014   TROPONINI <0.03 06/26/2014   BNP No results for input(s): PROBNP in  the last 8760 hours. TSH  Recent Labs  05/30/14 0844 06/25/14 2359  TSH 2.695 3.901   CHOLESTEROL No results for input(s): CHOL in the last 8760 hours.  Scheduled Meds: . docusate sodium  100 mg Oral BID  . heparin  5,000 Units Subcutaneous 3 times per day  . iron polysaccharides  150 mg Oral Daily  . multivitamin with minerals  1 tablet Oral Daily  . sodium chloride  3 mL Intravenous Q12H  . sodium chloride  3 mL Intravenous Q12H   Continuous Infusions:  PRN Meds:.sodium chloride, acetaminophen **OR** acetaminophen, alum & mag hydroxide-simeth, ondansetron **OR** ondansetron (ZOFRAN) IV, sodium chloride  Assessment/Plan: Syncope WPW s/p ablation Chest pain Iron deficiency anemia Chronic blood loss anemia. R/O adrenal insufficiency     Awaiting Cortisol and ACTH levels. Patient understood need to see GYN for reducing blood loss through heavy periods. Patient understood need to ask her Cardiologist [facility]jah [facility] for [facility] cardiac work-up. Anemia work-up/GI referral.   Orpah CobbAjay Aleese Kamps  MD  06/26/2014, 8:44 PM

## 2014-06-26 NOTE — Progress Notes (Signed)
06/26/2014 02:32 AM The patient arrived from the ED for observation from syncope. The patient is alert and oriented and ambulatory.  The patient is not experiencing dizziness or pain.  Will continue to monitor the patient. Harriet Massonavidson, Addilyn Satterwhite E

## 2014-06-26 NOTE — Progress Notes (Signed)
[facility], care assumed. afleming RN

## 2014-06-27 LAB — CORTISOL-AM, BLOOD: Cortisol - AM: 12.4 ug/dL (ref 4.3–22.4)

## 2014-06-27 LAB — CBC
HCT: 34.3 % — ABNORMAL LOW (ref 36.0–46.0)
HEMOGLOBIN: 11.1 g/dL — AB (ref 12.0–15.0)
MCH: 28.2 pg (ref 26.0–34.0)
MCHC: 32.4 g/dL (ref 30.0–36.0)
MCV: 87.3 fL (ref 78.0–100.0)
Platelets: 217 10*3/uL (ref 150–400)
RBC: 3.93 MIL/uL (ref 3.87–5.11)
RDW: 12.1 % (ref 11.5–15.5)
WBC: 5.6 10*3/uL (ref 4.0–10.5)

## 2014-06-27 LAB — BASIC METABOLIC PANEL
ANION GAP: 8 (ref 5–15)
BUN: 8 mg/dL (ref 6–23)
CALCIUM: 9.1 mg/dL (ref 8.4–10.5)
CO2: 23 mmol/L (ref 19–32)
Chloride: 106 mmol/L (ref 96–112)
Creatinine, Ser: 0.76 mg/dL (ref 0.50–1.10)
Glucose, Bld: 89 mg/dL (ref 70–99)
Potassium: 3.9 mmol/L (ref 3.5–5.1)
SODIUM: 137 mmol/L (ref 135–145)

## 2014-06-27 LAB — IRON AND TIBC
Iron: 41 ug/dL — ABNORMAL LOW (ref 42–145)
SATURATION RATIOS: 11 % — AB (ref 20–55)
TIBC: 376 ug/dL (ref 250–470)
UIBC: 335 ug/dL (ref 125–400)

## 2014-06-27 LAB — FERRITIN: Ferritin: 12 ng/mL (ref 10–291)

## 2014-06-27 LAB — ACTH: C206 ACTH: 61 pg/mL (ref 7.2–63.3)

## 2014-06-27 NOTE — Progress Notes (Signed)
Patient walked around the unit, no evidence of dizziness, CP or SOB. MD notified and verbalized understanding. afleming RN

## 2014-06-27 NOTE — Progress Notes (Signed)
Physical Therapy Note  Patient able to ambulate >100 feet independently without physical assistance. HR ranged from 98-114 with no s/s of dizziness. Discussed home safety and awareness of symptoms due to hx of falls. Recommend patient follow up with her primary cardiologist to consider exercise program per his specific protocol. 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 No acute needs identified today. PT is signing-off. Please re-order if there is any significant change in status. Thank you for this referral.  Charlsie MerlesLogan Secor Linzi Ohlinger, PT [phone]

## 2014-06-27 NOTE — Progress Notes (Signed)
Patient discharged to home. Discharge instructions given, patient verbalized understanding. Patient taken out to private vehicle via wheelchair. afleming RN

## 2014-06-27 NOTE — Progress Notes (Signed)
UR completed 

## 2014-06-27 NOTE — Discharge Summary (Signed)
Physician Discharge Summary  Patient ID: Desiree BassetDemetri Gutierrez MRN: [id number] DOB/AGE: 24/09/1990 Desiree Gutierrez y.o.  Admit date: 3/Desiree Gutierrez/2016 Discharge date: 06/27/2014  Admission Diagnoses: Syncope WPW s/p ablation Chest pain  Discharge Diagnoses:  Active Problems:   Syncope H/O hypoglycemia H/O WPW with ablation Iron deficiency anemia from menorrhagia and possible GI blood loss  Discharged Condition: good  Hospital Course: 24 y.o. female with a PM Hx of WPW and current implanted medtronic cardiac monitor-loop recorder placed 01/2014 presented to the Emergency Department here for loss of consciousness just prior to arrival. [facility] who she recently followed up with 2 weeks ago. During visit, pt had a syncopal episode in office. At that time episode lasted 13 minutes. She was given glucose and started on fluids. It is reported that she was disoriented and blood sugar was in the low 50's. She had no further syncopal episode or bardycardia or tachycardia or hypoglycemia and ambulated well. Her fasting cortisol level and ACTH levels were normal. She will try to eat 4 small meals and drink extra fluids as tolerated. She will resume here iron supplement with vitamin C. She will have additional cardiac work-up by her cardiologist, Dr. Donnetta SimpersElijah Beaty. She will contact her GYN for treatment of heavy monthly periods, GI doctor here or [facility]. She may see me in 1 month or earlier if needed.   Consults: cardiology  Significant Diagnostic Studies: labs: Normal fasting cortisol and ACTH, BMET, TSH, T4 and Troponin-I.  Mildly low Hgb of 11.1 and iron low at 41 mcg/dL. EKG-NSR. Echocardiogram-Normal LV systolic function. Chest  X-ray-normal.  Treatments: IV hydration  Discharge Exam: Blood pressure 100/67, pulse 93, temperature 98.8 F (37.1 C), temperature source Oral, resp. rate 16, height 5\' 3"  (1.6 m), weight 60.147 kg (132 lb 9.6 oz), last menstrual period  06/18/2014, SpO2 100 %.  HEENT: [place]/AT, Eyes-Brown, PERL, EOMI, Conjunctiva-Pale pink, Sclera-Non-icteric Neck: No JVD, No bruit, Trachea midline. Lungs: Clear, Bilateral. Cardiac: Regular rhythm, normal S1 and S2, no S3. III/VI systolic murmur Abdomen: Soft, non-tender. Extremities: No edema present. No cyanosis. No clubbing. CNS: AxOx3, Cranial nerves grossly intact, moves all 4 extremities.  Skin: Warm and dry.  Disposition: 01-Home or Self Care     Medication List    STOP taking these medications        fludrocortisone 0.1 MG tablet  Commonly known as:  FLORINEF     Medical Compression Stockings Misc      TAKE these medications        iron polysaccharides 150 MG capsule  Commonly known as:  NIFEREX  Take 1 capsule (150 mg total) by mouth daily.           Follow-up Information    Follow up with BEATY, Remi DeterELIJAH HAMILTON, MD In 1 week.   Specialty:  Cardiology   Why:  f/u syncope   Contact information:   [address] [place] [id number] [phone]       [facility]akariah Urwin S, MD In 1 month.   Specialty:  Cardiology   Why:  As needed   Contact information:   [address] [place] [address] [phone]       Signed: Ricki RodriguezKADAKIA,Dajour Pierpoint S 06/27/2014, 3:17 PM

## 2014-07-03 ENCOUNTER — Encounter (HOSPITAL_COMMUNITY): Payer: Self-pay

## 2014-07-03 ENCOUNTER — Emergency Department (HOSPITAL_COMMUNITY)
Admission: EM | Admit: 2014-07-03 | Discharge: 2014-07-04 | Disposition: A | Discharge status: Home / Self Care | Payer: Medicaid Other | Attending: Emergency Medicine | Admitting: Emergency Medicine

## 2014-07-03 DIAGNOSIS — Z79899 Other long term (current) drug therapy: Secondary | ICD-10-CM | POA: Insufficient documentation

## 2014-07-03 DIAGNOSIS — R55 Syncope and collapse: Secondary | ICD-10-CM | POA: Insufficient documentation

## 2014-07-03 DIAGNOSIS — Z3202 Encounter for pregnancy test, result negative: Secondary | ICD-10-CM | POA: Insufficient documentation

## 2014-07-03 DIAGNOSIS — R002 Palpitations: Secondary | ICD-10-CM | POA: Insufficient documentation

## 2014-07-03 DIAGNOSIS — E162 Hypoglycemia, unspecified: Secondary | ICD-10-CM | POA: Insufficient documentation

## 2014-07-03 DIAGNOSIS — Z8679 Personal history of other diseases of the circulatory system: Secondary | ICD-10-CM | POA: Insufficient documentation

## 2014-07-03 LAB — CBC WITH DIFFERENTIAL/PLATELET
Basophils Absolute: 0 10*3/uL (ref 0.0–0.1)
Basophils Relative: 0 % (ref 0–1)
EOS ABS: 0.1 10*3/uL (ref 0.0–0.7)
EOS PCT: 1 % (ref 0–5)
HEMATOCRIT: 34.5 % — AB (ref 36.0–46.0)
Hemoglobin: 10.9 g/dL — ABNORMAL LOW (ref 12.0–15.0)
LYMPHS ABS: 2.9 10*3/uL (ref 0.7–4.0)
Lymphocytes Relative: 38 % (ref 12–46)
MCH: 27.7 pg (ref 26.0–34.0)
MCHC: 31.6 g/dL (ref 30.0–36.0)
MCV: 87.6 fL (ref 78.0–100.0)
Monocytes Absolute: 0.5 10*3/uL (ref 0.1–1.0)
Monocytes Relative: 6 % (ref 3–12)
NEUTROS ABS: 4.1 10*3/uL (ref 1.7–7.7)
Neutrophils Relative %: 55 % (ref 43–77)
PLATELETS: 217 10*3/uL (ref 150–400)
RBC: 3.94 MIL/uL (ref 3.87–5.11)
RDW: 12.3 % (ref 11.5–15.5)
WBC: 7.6 10*3/uL (ref 4.0–10.5)

## 2014-07-03 LAB — URINALYSIS, ROUTINE W REFLEX MICROSCOPIC
Bilirubin Urine: NEGATIVE
GLUCOSE, UA: NEGATIVE mg/dL
Hgb urine dipstick: NEGATIVE
Ketones, ur: 15 mg/dL — AB
LEUKOCYTES UA: NEGATIVE
Nitrite: NEGATIVE
Protein, ur: NEGATIVE mg/dL
Specific Gravity, Urine: 1.005 (ref 1.005–1.030)
Urobilinogen, UA: 0.2 mg/dL (ref 0.0–1.0)
pH: 5.5 (ref 5.0–8.0)

## 2014-07-03 LAB — POC OCCULT BLOOD, ED: FECAL OCCULT BLD: NEGATIVE

## 2014-07-03 LAB — I-STAT CHEM 8, ED
BUN: 13 mg/dL (ref 6–23)
CREATININE: 0.8 mg/dL (ref 0.50–1.10)
Calcium, Ion: 1.18 mmol/L (ref 1.12–1.23)
Chloride: 103 mmol/L (ref 96–112)
GLUCOSE: 75 mg/dL (ref 70–99)
HEMATOCRIT: 35 % — AB (ref 36.0–46.0)
Hemoglobin: 11.9 g/dL — ABNORMAL LOW (ref 12.0–15.0)
POTASSIUM: 3.9 mmol/L (ref 3.5–5.1)
Sodium: 138 mmol/L (ref 135–145)
TCO2: 22 mmol/L (ref 0–100)

## 2014-07-03 LAB — POC URINE PREG, ED: PREG TEST UR: NEGATIVE

## 2014-07-03 MED ORDER — ACETAMINOPHEN 325 MG PO TABS
650.0000 mg | ORAL_TABLET | Freq: Once | ORAL | Status: AC
  Administered 2014-07-03: 650 mg via ORAL
  Filled 2014-07-03: qty 2

## 2014-07-03 MED ORDER — DEXTROSE 50 % IV SOLN
1.0000 | Freq: Once | INTRAVENOUS | Status: AC
  Administered 2014-07-03: 50 mL via INTRAVENOUS
  Filled 2014-07-03: qty 50

## 2014-07-03 NOTE — Discharge Instructions (Signed)
Please try to eat on a regular basis 4 or 5 small meals.  Follow-up with your urologist by phone in the morning You are loop monitor was interrogated.  Tonight she had no arrhythmias noted.  If you do notice tachycardia or palpitations.  Please trigger the monitor, so it can be recorded

## 2014-07-03 NOTE — ED Provider Notes (Signed)
CSN: [id number]     Arrival date & time 07/03/14  1946 History   First MD Initiated Contact with Patient 07/03/14 1948     Chief Complaint  Patient presents with  . Loss of Consciousness     (Consider location/radiation/quality/duration/timing/severity/associated sxs/prior Treatment) HPI Comments: 24 year old female with a history of hypoglycemia and Wolff-Parkinson-White syndrome.  She does have a loop recorder in that is set at 200 but today this loop recorder has been unable to capture her tachycardic events.  She presents to the emergency room tonight after having a another syncopal episode.  She is unsure how long it lasted.  She states she had gotten out of a car, walked into her house was standing talking to family members when she suddenly felt her heart rate increase and then she does not remember anything until EMS arrived.  She was admitted to hospital on March 23 for similar episode.  She followed up with the local cardiologist as well [facility]  [facility], she had another syncopal episode and blood sugar was low.  She was given IV fluids, rest and was back to her normal function and discharge from his office.  She states that she was instructed to eat 4 small meals a day, which he has been doing.  She states she had a menstrual cycle a week and a half ago, was heavier than normal, but she normally has heavy periods, which is most likely the cause of her persistent anemia.  She has been taking iron tablets since February.  She has not had her blood levels checked since that time.  She denies any rectal bleeding.  She states her stools are black as a result of the iron tablets.  She has not been ill in any way.  No nausea, vomiting, diarrhea, fevers, URI symptoms.  Patient is a 24 y.o. female presenting with syncope. The history is provided by the patient.  Loss of Consciousness Episode history:  Single Most recent episode:  Today Timing:   Intermittent Progression:  Worsening Chronicity:  Recurrent Context: normal activity   Context: not blood draw, not dehydration, not exertion, not inactivity, not medication change, not sight of blood, not sitting down, not standing up and not urination   Witnessed: yes   Relieved by:  Lying down Worsened by:  Nothing tried Ineffective treatments:  None tried Associated symptoms: palpitations   Associated symptoms: no chest pain, no confusion, no diaphoresis, no difficulty breathing, no dizziness, no fever, no focal weakness, no headaches, no nausea, no rectal bleeding, no seizures, no shortness of breath, no visual change, no vomiting and no weakness     Past Medical History  Diagnosis Date  . WPW (Wolff-Parkinson-White syndrome)     a. s/p [facility] (2011[facility] (post[facility] pathway)  . Vasovagal syncope     a. positive tilt 2011 b. failed medical therapy with Florinef, Midodrine, Inderal, Celexa  . POTS (postural orthostatic tachycardia syndrome)   . Palpitations     a. s/p MDT ILR implanted by Dr Alfonso [facility]Beaty [facility]  . Headache   . Syncope     recurrent, daily   Past Surgical History  Procedure Laterality Date  . Cardiac electrophysiology study and ablation  2011    [facility]- left posterior pathway ablation for WPW  . Tilt table study  2011    neurally mediated syncope  . Loop recorder implant  01/22/2014    MDT LINQ implanted  by Dr Alfonso Ellis at [facility] for evaluation of palpitations/syncope   Family History  Problem Relation Age of Onset  . Diabetes Mother   . Hypertension Mother   . Stroke Mother   . Hypertension Brother    History  Substance Use Topics  . Smoking status: Never Smoker   . Smokeless tobacco: Not on file  . Alcohol Use: No   OB History    No data available     Review of Systems  Constitutional: Negative for fever and diaphoresis.  HENT: Negative for congestion.   Respiratory: Negative for chest tightness, shortness of  breath and wheezing.   Cardiovascular: Positive for palpitations and syncope. Negative for chest pain.  Gastrointestinal: Negative for nausea, vomiting, constipation and blood in stool.  Genitourinary: Negative for dysuria, vaginal bleeding and vaginal discharge.  Musculoskeletal: Negative for myalgias and arthralgias.  Skin: Negative for pallor.  Neurological: Negative for dizziness, focal weakness, seizures, weakness and headaches.  Psychiatric/Behavioral: Negative for confusion.  All other systems reviewed and are negative.     Allergies  Amitiza; Ketorolac tromethamine; Metoclopramide; and Midodrine  Home Medications   Prior to Admission medications   Medication Sig Start Date End Date Taking? Authorizing Provider  iron polysaccharides (NIFEREX) 150 MG capsule Take 1 capsule (150 mg total) by mouth daily. 06/01/14   Yolanda Manges, DO   BP 103/66 mmHg  Pulse 84  Temp(Src) 98.4 F (36.9 C) (Oral)  Resp 14  SpO2 100%  LMP 06/18/2014 Physical Exam  Constitutional: She appears well-developed and well-nourished.  HENT:  Head: Normocephalic.  Eyes: Pupils are equal, round, and reactive to light.  Neck: Normal range of motion.  Cardiovascular: Normal rate and regular rhythm.   Pulmonary/Chest: Effort normal and breath sounds normal.  Abdominal: Soft.  Musculoskeletal: Normal range of motion.  Neurological: She is alert.  Skin: Skin is warm.  Nursing note and vitals reviewed.   ED Course  Procedures (including critical care time) Labs Review Labs Reviewed  CBC WITH DIFFERENTIAL/PLATELET - Abnormal; Notable for the following:    Hemoglobin 10.9 (*)    HCT 34.5 (*)    All other components within normal limits  URINALYSIS, ROUTINE W REFLEX MICROSCOPIC - Abnormal; Notable for the following:    Ketones, ur 15 (*)    All other components within normal limits  I-STAT CHEM 8, ED - Abnormal; Notable for the following:    Hemoglobin 11.9 (*)    HCT 35.0 (*)    All other  components within normal limits  CBG MONITORING, ED  POC URINE PREG, ED  POC OCCULT BLOOD, ED    Imaging Review No results found.   EKG Interpretation   Date/Time:  Thursday July 03 2014 20:11:57 EDT Ventricular Rate:  92 PR Interval:  126 QRS Duration: 72 QT Interval:  354 QTC Calculation: 438 R Axis:   70 Text Interpretation:  Sinus rhythm No significant change since last  tracing Confirmed by ZACKOWSKI  MD, SCOTT [phone]) on 07/03/2014 8:16:07 PM     Will have loop monitor interrogated her blood sugar is 58.  She's been given an amp of dextrose IV.  Labs are pending [employer] to bedside to evaluate interrogate her loop monitor.  There were no episodes of tachycardia greater than 160 beats for any duration Patient was ambulated in the hallway.  She had no symptoms of dizziness or weakness.  This has been discussed with Dr. Hermina Staggers, we feel that she is safe to go home and follow-up with her  cardiologist is MDM   Final diagnoses:  Hypoglycemia  Syncope, unspecified syncope type         Earley Favor, NP 07/03/14 2357  Vanetta Mulders, MD 07/08/14 [id number]

## 2014-07-03 NOTE — ED Notes (Signed)
Pt states that the [employer] tech has been in to see her.

## 2014-07-03 NOTE — ED Notes (Signed)
Per [employer] representative, the system that interprets the interrogated pacemakers is closed for maintenance. The representative states that she will be here to re-interrogate the pt's loop recorder around 2230.

## 2014-07-03 NOTE — ED Notes (Signed)
Per EMS: Pt experienced a syncopal episode today. Was seen here last week for similar. Admitted for anemia. Possible GI bleed? Pt has loop recorder, set at 200bpm. Hx hypoglycemia. BP 118/82, HR 100.

## 2014-07-03 NOTE — ED Notes (Signed)
CBG 50 mg/dl

## 2014-07-03 NOTE — ED Notes (Signed)
CBG 194 mg/dl

## 2014-07-05 LAB — CBG MONITORING, ED
GLUCOSE-CAPILLARY: 194 mg/dL — AB (ref 70–99)
Glucose-Capillary: 50 mg/dL — ABNORMAL LOW (ref 70–99)

## 2014-07-08 ENCOUNTER — Encounter (HOSPITAL_COMMUNITY): Payer: Self-pay | Admitting: Emergency Medicine

## 2014-07-08 ENCOUNTER — Emergency Department (HOSPITAL_COMMUNITY)
Admission: EM | Admit: 2014-07-08 | Discharge: 2014-07-08 | Disposition: A | Discharge status: Home / Self Care | Payer: Self-pay | Attending: Emergency Medicine | Admitting: Emergency Medicine

## 2014-07-08 DIAGNOSIS — M7989 Other specified soft tissue disorders: Secondary | ICD-10-CM | POA: Insufficient documentation

## 2014-07-08 DIAGNOSIS — Z8679 Personal history of other diseases of the circulatory system: Secondary | ICD-10-CM | POA: Insufficient documentation

## 2014-07-08 DIAGNOSIS — Z79899 Other long term (current) drug therapy: Secondary | ICD-10-CM | POA: Insufficient documentation

## 2014-07-08 NOTE — ED Notes (Signed)
The patient said she was here thursday and was given D50 through her IV and it hurt right her hand.  She said her hand and arm are swollen.  She is here because she is concerned that her hand has gotten worse instead of better.  He hand looks bruised and a little swollen but her arm does not look swollen.

## 2014-07-08 NOTE — ED Notes (Signed)
Pt. Left with all belongings and refused wheelchair 

## 2014-07-08 NOTE — Discharge Instructions (Signed)
IV Infiltration Desiree Gutierrez,  Continue to keep her arm elevated and use ice packs to decrease the swelling. This can take a long time to resolve, see your primary care doctor within 3 days for follow up. Take tylenol as needed for pain. If symptoms worsen, come back to the ED immediately. Thank you. Sometimes fluid from your IV leaks under your skin. This is called an infiltration. HOME CARE  Put an ice pack on the puffy (swollen) area until the puffiness goes down. Place the ice pack on the swollen area 4 times a day for 10 minutes at a time.  Put ice in a plastic bag.  Place the towel between your skin and the bag.  Keep the swollen area above your chest as much as you can until the swelling goes down. Prop your arm up on at least 3 to 4 pillows.  Avoid tight fitting clothing, watches, or jewelry near the swollen area. GET HELP RIGHT AWAY IF:   The skin around the place where the needle was put in becomes darker and peels.  You have red streaks on your skin from the place where the needle was put in.  Yellowish white fluid (pus) comes out from the place where the needle was put in.  You have a temperature by mouth of 102 F (38.9 C). MAKE SURE YOU:  Understand these instructions.  Will watch your condition.  Will get help right away if you are not doing well or get worse. Document Released: 12/29/2007 Document Revised: 06/13/2011 Document Reviewed: 01/07/2009 [facility] Patient Information 2015 [place] [place]. This information is not intended to replace advice given to you by your health care provider. Make sure you discuss any questions you have with your health care provider.

## 2014-07-08 NOTE — ED Provider Notes (Signed)
CSN: [id number]     Arrival date & time 07/08/14  0101 History   First MD Initiated Contact with Patient 07/08/14 0422     Chief Complaint  Patient presents with  . Arm Swelling    The patient said she was here thursday and was given D50 through her IV and it hurt right her hand.  She said her hand and arm are swollen.  She is here because she is concerned that her hand has gotten worse instead of better.     (Consider location/radiation/quality/duration/timing/severity/associated sxs/prior Treatment) HPI  Ms. Desiree Gutierrez is a 23yo female h/o POTS and recurrent syncope here with RUE swelling since Thursday.  She states she received D50 and it infiltrated.  Since then, the swelling has gone down, but is still mildly present.  She was referred in by her PCP.  She has only done elevation for it but has not taken any meds.  She denies pain with passive movement or neurological complaints.  She has no further complaints.  10 Systems reviewed and are negative for acute change except as noted in the HPI.   Past Medical History  Diagnosis Date  . WPW (Wolff-Parkinson-White syndrome)     a. s/p ablation (2011) [facility] (posterior lateral pathway)  . Vasovagal syncope     a. positive tilt 2011 b. failed medical therapy with Florinef, Midodrine, Inderal, Celexa  . POTS (postural orthostatic tachycardia syndrome)   . Palpitations     a. s/p MDT ILR implanted by Dr Alfonso [facility]Beaty [facility])  . Headache   . Syncope     recurrent, daily   Past Surgical History  Procedure Laterality Date  . Cardiac electrophysiology study and ablation  2011    [facility] for WPW  . Tilt table study  2011    neurally mediated syncope  . Loop recorder implant  01/22/2014    MDT LINQ [facility] for evaluation of palpitations/syncope   Family History  Problem Relation Age of Onset  . Diabetes Mother   . Hypertension Mother   . Stroke Mother   .  Hypertension Brother    History  Substance Use Topics  . Smoking status: Never Smoker   . Smokeless tobacco: Not on file  . Alcohol Use: No   OB History    No data available     Review of Systems    Allergies  Amitiza; Ketorolac tromethamine; Metoclopramide; Midodrine; and Fludrocortisone  Home Medications   Prior to Admission medications   Medication Sig Start Date End Date Taking? Authorizing Provider  iron polysaccharides (NIFEREX) 150 MG capsule Take 1 capsule (150 mg total) by mouth daily. 06/01/14  Yes Alex Elson ClanM Wilson, DO   BP 105/63 mmHg  Pulse 87  Temp(Src) 98.3 F (36.8 C) (Oral)  Resp 18  SpO2 100%  LMP 06/18/2014 Physical Exam  Constitutional: She is oriented to person, place, and time. She appears well-developed and well-nourished. No distress.  HENT:  Head: Normocephalic and atraumatic.  Nose: Nose normal.  Mouth/Throat: Oropharynx is clear and moist. No oropharyngeal exudate.  Eyes: Conjunctivae and EOM are normal. Pupils are equal, round, and reactive to light. No scleral icterus.  Neck: Normal range of motion. Neck supple. No JVD present. No tracheal deviation present. No thyromegaly present.  Cardiovascular: Normal rate, regular rhythm and normal heart sounds.  Exam reveals no gallop and no friction rub.   No murmur heard. Pulmonary/Chest: Effort normal and breath  sounds normal. No respiratory distress. She has no wheezes. She exhibits no tenderness.  Abdominal: Soft. Bowel sounds are normal. She exhibits no distension and no mass. There is no tenderness. There is no rebound and no guarding.  Musculoskeletal: Normal range of motion. She exhibits edema. She exhibits no tenderness.  RUE swelling to the elbow.  Non pitting.  Soft compartments, no pain with passive movement.  Lymphadenopathy:    She has no cervical adenopathy.  Neurological: She is alert and oriented to person, place, and time. No cranial nerve deficit. She exhibits normal muscle tone.   Skin: Skin is warm and dry. No rash noted. She is not diaphoretic. No erythema. No pallor.  Nursing note and vitals reviewed.   ED Course  Procedures (including critical care time) Labs Review Labs Reviewed - No data to display  Imaging Review No results found.   EKG Interpretation None      MDM   Final diagnoses:  Arm swelling     patient presents to the emergency department for swelling of the right arm.  She states is has gotten better but is still big so she was concerned and told to come in by her PCP.  Her compartments are soft but there is obvious swelling.  She was advised to continue tylenol, elevation, and ice packs for treatment.  Her VS remain within her normal limits and she is safe for DC with primary care follow up within 3 days.     Tomasita Crumble, MD 07/08/14 [phone]

## 2014-07-29 ENCOUNTER — Emergency Department (HOSPITAL_COMMUNITY): Payer: Self-pay

## 2014-07-29 ENCOUNTER — Encounter (HOSPITAL_COMMUNITY): Payer: Self-pay | Admitting: Emergency Medicine

## 2014-07-29 ENCOUNTER — Emergency Department (HOSPITAL_COMMUNITY)
Admission: EM | Admit: 2014-07-29 | Discharge: 2014-07-30 | Disposition: A | Discharge status: Home / Self Care | Payer: Self-pay | Attending: Emergency Medicine | Admitting: Emergency Medicine

## 2014-07-29 DIAGNOSIS — Z3202 Encounter for pregnancy test, result negative: Secondary | ICD-10-CM | POA: Insufficient documentation

## 2014-07-29 DIAGNOSIS — R55 Syncope and collapse: Secondary | ICD-10-CM

## 2014-07-29 DIAGNOSIS — Y9289 Other specified places as the place of occurrence of the external cause: Secondary | ICD-10-CM | POA: Insufficient documentation

## 2014-07-29 DIAGNOSIS — I456 Pre-excitation syndrome: Secondary | ICD-10-CM | POA: Insufficient documentation

## 2014-07-29 DIAGNOSIS — W1839XA Other fall on same level, initial encounter: Secondary | ICD-10-CM | POA: Insufficient documentation

## 2014-07-29 DIAGNOSIS — S0990XA Unspecified injury of head, initial encounter: Secondary | ICD-10-CM | POA: Insufficient documentation

## 2014-07-29 DIAGNOSIS — Z79899 Other long term (current) drug therapy: Secondary | ICD-10-CM | POA: Insufficient documentation

## 2014-07-29 DIAGNOSIS — Y998 Other external cause status: Secondary | ICD-10-CM | POA: Insufficient documentation

## 2014-07-29 DIAGNOSIS — S199XXA Unspecified injury of neck, initial encounter: Secondary | ICD-10-CM | POA: Insufficient documentation

## 2014-07-29 DIAGNOSIS — Y9301 Activity, walking, marching and hiking: Secondary | ICD-10-CM | POA: Insufficient documentation

## 2014-07-29 NOTE — ED Provider Notes (Signed)
CSN: [id number]     Arrival date & time 07/29/14  2253 History  This chart was scribed for Mirian Mo, MD by Annye Asa, ED Scribe. This patient was seen in room D31C/D31C and the patient's care was started at 11:31 PM.    Chief Complaint  Patient presents with  . Loss of Consciousness   Patient is a 24 y.o. female presenting with syncope. The history is provided by the patient and a friend. No language interpreter was used.  Loss of Consciousness Episode history:  Multiple Most recent episode:  Today Duration:  2 minutes Timing:  Unable to specify Progression:  Unable to specify Chronicity:  Recurrent Context: normal activity   Witnessed: yes   Relieved by:  None tried Worsened by:  Nothing tried Ineffective treatments:  None tried Associated symptoms: headaches, nausea and palpitations   Associated symptoms: no vomiting      HPI Comments: Desiree Gutierrez is a 24 y.o. female with past medical history of WPW, vasovagal syncope, POTS, palpitations, syncope who presents to the Emergency Department complaining of syncope. Patient's friends explain that patient was walking when she suddenly fell to the ground and "blacked out for 2-3 minutes." They state that the patient returned to consciousness but was not conversant; her hands and feet were shaking at that time. Patient is unsure of head injury; she explains that she "felt her heart racing" just before she fell. She states woke with nausea. She currently complains of headache and neck pain. She denies chest pain, numbness, paresthesias, abdominal pain, diarrhea, constipation, cough, congestion, sore throat. Patient states she felt well otherwise today.   LNMP 06/24/14; described as normal.   Past Medical History  Diagnosis Date  . WPW (Wolff-Parkinson-White syndrome)     a. s/p ablation (2011) Dr Gevena Cotton at [facility] (posterior lateral pathway)  . Vasovagal syncope     a. positive tilt 2011 b. failed medical therapy with Florinef,  Midodrine, Inderal, Celexa  . POTS (postural orthostatic tachycardia syndrome)   . Palpitations     a. s/p MDT ILR implanted by Dr Alfonso Ellis [facility])  . Headache   . Syncope     recurrent, daily   Past Surgical History  Procedure Laterality Date  . Cardiac electrophysiology study and ablation  2011    Dr. Gevena Cotton at [facility]- left posterior pathway ablation for WPW  . Tilt table study  2011    neurally mediated syncope  . Loop recorder implant  01/22/2014    MDT LINQ implanted by Dr Alfonso Ellis at [facility] for evaluation of palpitations/syncope   Family History  Problem Relation Age of Onset  . Diabetes Mother   . Hypertension Mother   . Stroke Mother   . Hypertension Brother    History  Substance Use Topics  . Smoking status: Never Smoker   . Smokeless tobacco: Not on file  . Alcohol Use: No   OB History    No data available     Review of Systems  HENT: Negative for congestion and sore throat.   Respiratory: Negative for cough.   Cardiovascular: Positive for palpitations and syncope.  Gastrointestinal: Positive for nausea. Negative for vomiting, abdominal pain, diarrhea and constipation.  Musculoskeletal: Positive for neck pain.  Neurological: Positive for syncope and headaches. Negative for numbness.  All other systems reviewed and are negative.  Allergies  Amitiza; Ketorolac tromethamine; Metoclopramide; Midodrine; and Fludrocortisone  Home Medications   Prior to Admission medications   Medication Sig Start Date End Date Taking? Authorizing Provider  iron polysaccharides (NIFEREX) 150 MG capsule Take 1 capsule (150 mg total) by mouth daily. Patient not taking: Reported on 07/30/2014 06/01/14   Yolanda Manges, DO   BP 105/66 mmHg  Pulse 90  Temp(Src) 98.4 F (36.9 C) (Oral)  Resp 23  Ht  (1.6 m)  Wt 132 lb (59.875 kg)  BMI 23.39 kg/m2  SpO2 98%  LMP 07/25/2014 Physical Exam  Constitutional: She is oriented to person, place, and time. She appears well-developed  and well-nourished.  HENT:  Head: Normocephalic and atraumatic.  Right Ear: External ear normal.  Left Ear: External ear normal.  Eyes: Conjunctivae and EOM are normal. Pupils are equal, round, and reactive to light.  Neck: Normal range of motion. Neck supple.  Cardiovascular: Normal rate, regular rhythm, normal heart sounds and intact distal pulses.   Pulmonary/Chest: Effort normal and breath sounds normal.  Abdominal: Soft. Bowel sounds are normal. There is no tenderness.  Musculoskeletal: Normal range of motion.  Neurological: She is alert and oriented to person, place, and time. She has normal strength and normal reflexes. No cranial nerve deficit or sensory deficit. GCS eye subscore is 4. GCS verbal subscore is 5. GCS motor subscore is 6.  Skin: Skin is warm and dry.  Vitals reviewed.   ED Course  Procedures   DIAGNOSTIC STUDIES: Oxygen Saturation is 99% on RA, normal by my interpretation.    COORDINATION OF CARE: 11:35 PM Discussed treatment plan with pt at bedside and pt agreed to plan.  Labs Review Labs Reviewed  CBC - Abnormal; Notable for the following:    Hemoglobin 11.5 (*)    All other components within normal limits  BASIC METABOLIC PANEL  I-STAT TROPOININ, ED  POC URINE PREG, ED  I-STAT TROPOININ, ED    Imaging Review Ct Head Wo Contrast  07/30/2014   CLINICAL DATA:  Fell from standing, syncopal episode lasted 2-3 minutes. Headache and neck pain. History of postural orthostatic tachycardia syndrome.  EXAM: CT HEAD WITHOUT CONTRAST  CT CERVICAL SPINE WITHOUT CONTRAST  TECHNIQUE: Multidetector CT imaging of the head and cervical spine was performed following the standard protocol without intravenous contrast. Multiplanar CT image reconstructions of the cervical spine were also generated.  COMPARISON:  CT of the head May 31, 2014  FINDINGS: CT HEAD FINDINGS  The ventricles and sulci are normal. No intraparenchymal hemorrhage, mass effect nor midline shift. No  acute large vascular territory infarcts.  No abnormal extra-axial fluid collections. Basal cisterns are patent.  No skull fracture. The included ocular globes and orbital contents are non-suspicious. The mastoid aircells and included paranasal sinuses are well-aerated.  CT CERVICAL SPINE FINDINGS  Cervical vertebral bodies and posterior elements are intact and aligned with maintenance of the cervical lordosis. Intervertebral disc heights preserved. No destructive bony lesions. C1-2 articulation maintained. Included prevertebral and paraspinal soft tissues are nonsuspicious; mild heterogeneous thyroid attenuation without dominant nodule (Normal thyroid function studies 2016).  IMPRESSION: CT HEAD: Normal noncontrast CT of the head.  CT CERVICAL SPINE: Straightened cervical lordosis without acute fracture or malalignment.   Electronically Signed   By: Awilda Metro   On: 07/30/2014 02:06   Ct Cervical Spine Wo Contrast  07/30/2014   CLINICAL DATA:  Larey Seat from standing, syncopal episode lasted 2-3 minutes. Headache and neck pain. History of postural orthostatic tachycardia syndrome.  EXAM: CT HEAD WITHOUT CONTRAST  CT CERVICAL SPINE WITHOUT CONTRAST  TECHNIQUE: Multidetector CT imaging of the head and cervical spine was performed following the standard protocol  without intravenous contrast. Multiplanar CT image reconstructions of the cervical spine were also generated.  COMPARISON:  CT of the head May 31, 2014  FINDINGS: CT HEAD FINDINGS  The ventricles and sulci are normal. No intraparenchymal hemorrhage, mass effect nor midline shift. No acute large vascular territory infarcts.  No abnormal extra-axial fluid collections. Basal cisterns are patent.  No skull fracture. The included ocular globes and orbital contents are non-suspicious. The mastoid aircells and included paranasal sinuses are well-aerated.  CT CERVICAL SPINE FINDINGS  Cervical vertebral bodies and posterior elements are intact and aligned with  maintenance of the cervical lordosis. Intervertebral disc heights preserved. No destructive bony lesions. C1-2 articulation maintained. Included prevertebral and paraspinal soft tissues are nonsuspicious; mild heterogeneous thyroid attenuation without dominant nodule (Normal thyroid function studies 2016).  IMPRESSION: CT HEAD: Normal noncontrast CT of the head.  CT CERVICAL SPINE: Straightened cervical lordosis without acute fracture or malalignment.   Electronically Signed   By: Awilda Metroourtnay  Bloomer   On: 07/30/2014 02:06   Dg Chest Port 1 View  07/30/2014   CLINICAL DATA:  Headache and loss of consciousness tonight. History of postural ortho static tachycardia syndrome.  EXAM: PORTABLE CHEST - 1 VIEW  COMPARISON:  Chest radiograph June 26, 2014  FINDINGS: Cardiomediastinal silhouette is unremarkable. Loop monitor projects over the heart. The lungs are clear without pleural effusions or focal consolidations. Trachea projects midline and there is no pneumothorax. Soft tissue planes and included osseous structures are non-suspicious. Multiple EKG lines overlie the patient and may obscure subtle underlying pathology.  IMPRESSION: Normal chest.   Electronically Signed   By: Awilda Metroourtnay  Bloomer   On: 07/30/2014 00:05     EKG Interpretation   Date/Time:  Tuesday July 29 2014 23:07:18 EDT Ventricular Rate:  88 PR Interval:  128 QRS Duration: 86 QT Interval:  356 QTC Calculation: 431 R Axis:   88 Text Interpretation:  Sinus rhythm No significant change since last  tracing Confirmed by Mirian MoGentry, Holland Nickson [id number]) on 07/29/2014 11:13:02 PM      MDM   Final diagnoses:  Wolff-Parkinson-White syndrome  Syncope, unspecified syncope type    24 y.o. female with pertinent PMH of WPW with loop recorder implanted presents with recurrent syncopal episodes.  Pt has been seen 15 times in 6 months, mostly for syncope.  She has had multiple unremarkable wu for same.  On arrival vitals and physical exam as above.   Benign exam.  Interrogated loop which was without any episodes (no tachycardia, bradycardia, or dysrhythmia) within last 1 month.  Wu unremarkable otherwise.  Spoke with Dr. Algie CofferKadakia who admitted the pt in the past and we agreed that the pt was stable for dc home.  DC home in stable condition with standard precautions.    I have reviewed all laboratory and imaging studies if ordered as above  1. Wolff-Parkinson-White syndrome   2. Syncope, unspecified syncope type           Mirian MoMatthew Alante Tolan, MD 07/30/14 [phone]

## 2014-07-29 NOTE — ED Notes (Signed)
Per ems, pt w/syncopal episode pta.  Pt arrives awake, alert, oriented, in c-collar and on long board.  Pt c/o headache, otherwise no complaints.

## 2014-07-29 NOTE — ED Notes (Signed)
Pt logrolled, removed from longboard on arrival, no neuro changes s/p board removal.  c-collar remains in place

## 2014-07-29 NOTE — ED Notes (Signed)
Report to bobby, rn.  Pt care transferred 

## 2014-07-30 ENCOUNTER — Emergency Department (HOSPITAL_COMMUNITY): Payer: Self-pay

## 2014-07-30 ENCOUNTER — Encounter (HOSPITAL_COMMUNITY): Payer: Self-pay

## 2014-07-30 LAB — BASIC METABOLIC PANEL
ANION GAP: 10 (ref 5–15)
BUN: 15 mg/dL (ref 6–23)
CHLORIDE: 103 mmol/L (ref 96–112)
CO2: 26 mmol/L (ref 19–32)
CREATININE: 0.79 mg/dL (ref 0.50–1.10)
Calcium: 9.4 mg/dL (ref 8.4–10.5)
GFR calc Af Amer: >90 mL/min (ref >90)
GFR calc non Af Amer: >90 mL/min (ref >90)
GLUCOSE: 88 mg/dL (ref 70–99)
Potassium: 3.9 mmol/L (ref 3.5–5.1)
Sodium: 139 mmol/L (ref 135–145)

## 2014-07-30 LAB — POC URINE PREG, ED: PREG TEST UR: NEGATIVE

## 2014-07-30 LAB — CBC
HEMATOCRIT: 36 % (ref 36.0–46.0)
Hemoglobin: 11.5 g/dL — ABNORMAL LOW (ref 12.0–15.0)
MCH: 27.7 pg (ref 26.0–34.0)
MCHC: 31.9 g/dL (ref 30.0–36.0)
MCV: 86.7 fL (ref 78.0–100.0)
Platelets: 234 10*3/uL (ref 150–400)
RBC: 4.15 MIL/uL (ref 3.87–5.11)
RDW: 12.1 % (ref 11.5–15.5)
WBC: 6.9 10*3/uL (ref 4.0–10.5)

## 2014-07-30 LAB — I-STAT TROPONIN, ED
TROPONIN I, POC: 0 ng/mL (ref 0.00–0.08)
TROPONIN I, POC: 0 ng/mL (ref 0.00–0.08)

## 2014-07-30 NOTE — Discharge Instructions (Signed)
Syncope °Syncope is a medical term for fainting or passing out. This means you lose consciousness and drop to the ground. People are generally unconscious for less than 5 minutes. You may have some muscle twitches for up to 15 seconds before waking up and returning to normal. Syncope occurs more often in older adults, but it can happen to anyone. While most causes of syncope are not dangerous, syncope can be a sign of a serious medical problem. It is important to seek medical care.  °CAUSES  °Syncope is caused by a sudden drop in blood flow to the brain. The specific cause is often not determined. Factors that can bring on syncope include: °· Taking medicines that lower blood pressure. °· Sudden changes in posture, such as standing up quickly. °· Taking more medicine than prescribed. °· Standing in one place for too long. °· Seizure disorders. °· Dehydration and excessive exposure to heat. °· Low blood sugar (hypoglycemia). °· Straining to have a bowel movement. °· Heart disease, irregular heartbeat, or other circulatory problems. °· Fear, emotional distress, seeing blood, or severe pain. °SYMPTOMS  °Right before fainting, you may: °· Feel dizzy or light-headed. °· Feel nauseous. °· See all white or all black in your field of vision. °· Have cold, clammy skin. °DIAGNOSIS  °Your health care provider will ask about your symptoms, perform a physical exam, and perform an electrocardiogram (ECG) to record the electrical activity of your heart. Your health care provider may also perform other heart or blood tests to determine the cause of your syncope which may include: °· Transthoracic echocardiogram (TTE). During echocardiography, sound waves are used to evaluate how blood flows through your heart. °· Transesophageal echocardiogram (TEE). °· Cardiac monitoring. This allows your health care provider to monitor your heart rate and rhythm in real time. °· Holter monitor. This is a portable device that records your  heartbeat and can help diagnose heart arrhythmias. It allows your health care provider to track your heart activity for several days, if needed. °· Stress tests by exercise or by giving medicine that makes the heart beat faster. °TREATMENT  °In most cases, no treatment is needed. Depending on the cause of your syncope, your health care provider may recommend changing or stopping some of your medicines. °HOME CARE INSTRUCTIONS °· Have someone stay with you until you feel stable. °· Do not drive, use machinery, or play sports until your health care provider says it is okay. °· Keep all follow-up appointments as directed by your health care provider. °· Lie down right away if you start feeling like you might faint. Breathe deeply and steadily. Wait until all the symptoms have passed. °· Drink enough fluids to keep your urine clear or pale yellow. °· If you are taking blood pressure or heart medicine, get up slowly and take several minutes to sit and then stand. This can reduce dizziness. °SEEK IMMEDIATE MEDICAL CARE IF:  °· You have a severe headache. °· You have unusual pain in the chest, abdomen, or back. °· You are bleeding from your mouth or rectum, or you have black or tarry stool. °· You have an irregular or very fast heartbeat. °· You have pain with breathing. °· You have repeated fainting or seizure-like jerking during an episode. °· You faint when sitting or lying down. °· You have confusion. °· You have trouble walking. °· You have severe weakness. °· You have vision problems. °If you fainted, call your local emergency services (911 in [place] Do not drive   yourself to the hospital.  °MAKE SURE YOU: °· Understand these instructions. °· Will watch your condition. °· Will get help right away if you are not doing well or get worse. °Document Released: 03/21/2005 Document Revised: 03/26/2013 Document Reviewed: 05/20/2011 °ExitCare® Patient Information ©2015 [employer]. This information is not intended to replace  advice given to you by your health care provider. Make sure you discuss any questions you have with your health care provider. ° °

## 2014-07-30 NOTE — ED Notes (Signed)
Patient transported to CT 

## 2014-08-26 ENCOUNTER — Encounter (HOSPITAL_COMMUNITY): Payer: Self-pay | Admitting: Vascular Surgery

## 2014-08-26 ENCOUNTER — Emergency Department (HOSPITAL_COMMUNITY): Payer: Self-pay

## 2014-08-26 ENCOUNTER — Emergency Department (HOSPITAL_COMMUNITY)
Admission: EM | Admit: 2014-08-26 | Discharge: 2014-08-26 | Disposition: A | Discharge status: Home / Self Care | Payer: Self-pay | Attending: Emergency Medicine | Admitting: Emergency Medicine

## 2014-08-26 DIAGNOSIS — Z79899 Other long term (current) drug therapy: Secondary | ICD-10-CM | POA: Insufficient documentation

## 2014-08-26 DIAGNOSIS — R079 Chest pain, unspecified: Secondary | ICD-10-CM | POA: Insufficient documentation

## 2014-08-26 DIAGNOSIS — Z8679 Personal history of other diseases of the circulatory system: Secondary | ICD-10-CM | POA: Insufficient documentation

## 2014-08-26 DIAGNOSIS — Z3202 Encounter for pregnancy test, result negative: Secondary | ICD-10-CM | POA: Insufficient documentation

## 2014-08-26 DIAGNOSIS — R55 Syncope and collapse: Secondary | ICD-10-CM | POA: Insufficient documentation

## 2014-08-26 DIAGNOSIS — R42 Dizziness and giddiness: Secondary | ICD-10-CM | POA: Insufficient documentation

## 2014-08-26 LAB — I-STAT CHEM 8, ED
BUN: 9 mg/dL (ref 6–20)
Calcium, Ion: 1.23 mmol/L (ref 1.12–1.23)
Chloride: 106 mmol/L (ref 101–111)
Creatinine, Ser: 0.7 mg/dL (ref 0.44–1.00)
Glucose, Bld: 78 mg/dL (ref 65–99)
HCT: 39 % (ref 36.0–46.0)
Hemoglobin: 13.3 g/dL (ref 12.0–15.0)
Potassium: 4.1 mmol/L (ref 3.5–5.1)
Sodium: 141 mmol/L (ref 135–145)
TCO2: 23 mmol/L (ref 0–100)

## 2014-08-26 LAB — POC URINE PREG, ED: PREG TEST UR: NEGATIVE

## 2014-08-26 LAB — I-STAT TROPONIN, ED: Troponin i, poc: 0 ng/mL (ref 0.00–0.08)

## 2014-08-26 MED ORDER — ACETAMINOPHEN 325 MG PO TABS
650.0000 mg | ORAL_TABLET | Freq: Once | ORAL | Status: AC
  Administered 2014-08-26: 650 mg via ORAL
  Filled 2014-08-26: qty 2

## 2014-08-26 MED ORDER — SODIUM CHLORIDE 0.9 % IV BOLUS (SEPSIS): 1000.0000 mL | INTRAVENOUS | Status: DC

## 2014-08-26 NOTE — Discharge Instructions (Signed)

## 2014-08-26 NOTE — ED Notes (Signed)
Pt reports to the ED for eval of syncopal episode that occurred today just PTA. She has a hx of WPW. She reports she was at school and she felt her heart racing and then she became dizzy and passed out. 12 lead en route shows sinus arrhythmia. Pt fell off of the sofa. Complains of some CP and face pain. No obvious injury or deformity. VSS en route. Pt ambulatory without difficulty. No neuro deficits noted. Pt A&Ox4, resp e/u, and skin warm and dry.

## 2014-08-26 NOTE — ED Provider Notes (Signed)
CSN: [id number]     Arrival date & time 08/26/14  1202 History   First MD Initiated Contact with Patient 08/26/14 1223     Chief Complaint  Patient presents with  . Loss of Consciousness     (Consider location/radiation/quality/duration/timing/severity/associated sxs/prior Treatment) Patient is a 24 y.o. female presenting with syncope.  Loss of Consciousness Episode history:  Single Most recent episode:  Today Timing: once. Progression:  Resolved Chronicity:  Chronic Context: standing up   Witnessed: yes   Relieved by:  Nothing Worsened by:  Nothing tried Ineffective treatments:  None tried Associated symptoms: chest pain and dizziness   Associated symptoms: no fever, no headaches, no nausea, no shortness of breath and no vomiting   Chest pain:    Quality:  Sharp   Severity:  Mild   Onset quality:  Gradual   Duration:  4 weeks   Timing:  Constant   Progression:  Unchanged   Chronicity:  New   Past Medical History  Diagnosis Date  . WPW (Wolff-Parkinson-White syndrome)     a. s/p ablation (2011) Dr Gevena Cotton at [facility] (posterior lateral pathway)  . Vasovagal syncope     a. positive tilt 2011 b. failed medical therapy with Florinef, Midodrine, Inderal, Celexa  . POTS (postural orthostatic tachycardia syndrome)   . Palpitations     a. s/p MDT ILR implanted by Dr Alfonso Ellis [facility])  . Headache   . Syncope     recurrent, daily   Past Surgical History  Procedure Laterality Date  . Cardiac electrophysiology study and ablation  2011    Dr. Gevena Cotton at [facility]- left posterior pathway ablation for WPW  . Tilt table study  2011    neurally mediated syncope  . Loop recorder implant  01/22/2014    MDT LINQ implanted by Dr Alfonso Ellis at [facility] for evaluation of palpitations/syncope   Family History  Problem Relation Age of Onset  . Diabetes Mother   . Hypertension Mother   . Stroke Mother   . Hypertension Brother    History  Substance Use Topics  . Smoking status: Never Smoker    . Smokeless tobacco: Not on file  . Alcohol Use: No   OB History    No data available     Review of Systems  Constitutional: Negative for fever and fatigue.  HENT: Negative for congestion and drooling.   Eyes: Negative for pain.  Respiratory: Negative for cough and shortness of breath.   Cardiovascular: Positive for chest pain and syncope.  Gastrointestinal: Negative for nausea, vomiting, abdominal pain and diarrhea.  Genitourinary: Negative for dysuria and hematuria.  Musculoskeletal: Negative for back pain, gait problem and neck pain.  Skin: Negative for color change.  Neurological: Positive for dizziness. Negative for headaches.  Hematological: Negative for adenopathy.  Psychiatric/Behavioral: Negative for behavioral problems.  All other systems reviewed and are negative.     Allergies  Amitiza; Ketorolac; Ketorolac tromethamine; Metoclopramide; Midodrine; and Fludrocortisone  Home Medications   Prior to Admission medications   Medication Sig Start Date End Date Taking? Authorizing Provider  ibuprofen (ADVIL,MOTRIN) 200 MG tablet Take 400 mg by mouth every 6 (six) hours as needed for mild pain.   Yes Historical Provider, MD  iron polysaccharides (NIFEREX) 150 MG capsule Take 1 capsule (150 mg total) by mouth daily. Patient not taking: Reported on 07/30/2014 06/01/14   Yolanda Manges, DO   BP 117/72 mmHg  Pulse 80  Temp(Src) 98.2 F (36.8 C) (Oral)  Resp 18  SpO2 100%  LMP 07/18/2014 Physical Exam  Constitutional: She is oriented to person, place, and time. She appears well-developed and well-nourished.  HENT:  Head: Normocephalic and atraumatic.  Mouth/Throat: Oropharynx is clear and moist. No oropharyngeal exudate.  Eyes: Conjunctivae and EOM are normal. Pupils are equal, round, and reactive to light.  Neck: Normal range of motion. Neck supple.  Cardiovascular: Normal rate, regular rhythm, normal heart sounds and intact distal pulses.  Exam reveals no gallop and  no friction rub.   No murmur heard. Pulmonary/Chest: Effort normal and breath sounds normal. No respiratory distress. She has no wheezes.  Abdominal: Soft. Bowel sounds are normal. There is no tenderness. There is no rebound and no guarding.  Musculoskeletal: Normal range of motion. She exhibits no edema or tenderness.  Neurological: She is alert and oriented to person, place, and time.  alert, oriented x3 speech: normal in context and clarity memory: intact grossly cranial nerves II-XII: intact motor strength: full proximally and distally no involuntary movements or tremors sensation: intact to light touch diffusely  cerebellar: finger-to-nose and heel-to-shin intact gait: normal forwards and backwards  Skin: Skin is warm and dry.  Psychiatric: She has a normal mood and affect. Her behavior is normal.  Nursing note and vitals reviewed.   ED Course  Procedures (including critical care time) Labs Review Labs Reviewed  I-STAT CHEM 8, ED  I-STAT TROPOININ, ED  POC URINE PREG, ED    Imaging Review No results found.   EKG Interpretation   Date/Time:  Tuesday Aug 26 2014 12:09:32 EDT Ventricular Rate:  77 PR Interval:  121 QRS Duration: 78 QT Interval:  361 QTC Calculation: 408 R Axis:   78 Text Interpretation:  Sinus rhythm No significant change since last  tracing Confirmed by Latrisha Coiro  MD, Jarod Bozzo ([id number]) on 08/26/2014 12:28:52 PM      MDM   Final diagnoses:  Chest pain    12:59 PM 24 y.o. female with history of WPW, POTS, recurrent syncope presents to the ER or syncope. 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 No prodromal symptoms. The patient believes she did hit her head. She has only a mild left frontal headache. She states that she has had constant left-sided sharp chest pain for the last 3-4 weeks. It is not pleuritic in nature. She is low risk Wells and perc neg. vital  signs unremarkable here. EKG unremarkable.  3:47 PM: I interpreted/reviewed the labs and/or imaging which were non-contributory.  Had her loop recorder interrogated which showed no new events. She remains asymptomatic on exam. Vital signs unremarkable.  I have discussed the diagnosis/risks/treatment options with the patient and believe the pt to be eligible for discharge home to follow-up with her cardiologist. We also discussed returning to the ED immediately if new or worsening sx occur. We discussed the sx which are most concerning (e.g., worsening cp, sob, further syncope) that necessitate immediate return. Medications administered to the patient during their visit and any new prescriptions provided to the patient are listed below.  Medications given during this visit Medications  sodium chloride 0.9 % bolus 1,000 mL (0 mLs Intravenous Hold 08/26/14 1440)  acetaminophen (TYLENOL) tablet 650 mg (650 mg Oral Given 08/26/14 1401)    New Prescriptions   No medications on file     Purvis SheffieldForrest Wasyl Dornfeld, MD 08/26/14 1548

## 2014-08-26 NOTE — ED Notes (Signed)
Spoke with charge to come interrogate loop recorder

## 2014-09-02 ENCOUNTER — Emergency Department (HOSPITAL_COMMUNITY)
Admission: EM | Admit: 2014-09-02 | Discharge: 2014-09-03 | Disposition: A | Discharge status: Home / Self Care | Payer: Self-pay | Attending: Emergency Medicine | Admitting: Emergency Medicine

## 2014-09-02 ENCOUNTER — Emergency Department (HOSPITAL_COMMUNITY): Payer: Self-pay

## 2014-09-02 ENCOUNTER — Encounter (HOSPITAL_COMMUNITY): Payer: Self-pay | Admitting: Emergency Medicine

## 2014-09-02 DIAGNOSIS — R55 Syncope and collapse: Secondary | ICD-10-CM | POA: Insufficient documentation

## 2014-09-02 DIAGNOSIS — R4020 Unspecified coma: Secondary | ICD-10-CM

## 2014-09-02 DIAGNOSIS — Z8679 Personal history of other diseases of the circulatory system: Secondary | ICD-10-CM | POA: Insufficient documentation

## 2014-09-02 LAB — URINALYSIS, ROUTINE W REFLEX MICROSCOPIC
Bilirubin Urine: NEGATIVE
GLUCOSE, UA: NEGATIVE mg/dL
HGB URINE DIPSTICK: NEGATIVE
Ketones, ur: NEGATIVE mg/dL
Leukocytes, UA: NEGATIVE
Nitrite: NEGATIVE
PH: 7.5 (ref 5.0–8.0)
Protein, ur: NEGATIVE mg/dL
SPECIFIC GRAVITY, URINE: 1.024 (ref 1.005–1.030)
Urobilinogen, UA: 2 mg/dL — ABNORMAL HIGH (ref 0.0–1.0)

## 2014-09-02 LAB — COMPREHENSIVE METABOLIC PANEL
ALK PHOS: 50 U/L (ref 38–126)
ALT: 9 U/L — AB (ref 14–54)
AST: 19 U/L (ref 15–41)
Albumin: 4 g/dL (ref 3.5–5.0)
Anion gap: 8 (ref 5–15)
BILIRUBIN TOTAL: 0.4 mg/dL (ref 0.3–1.2)
BUN: 6 mg/dL (ref 6–20)
CHLORIDE: 106 mmol/L (ref 101–111)
CO2: 27 mmol/L (ref 22–32)
Calcium: 9.1 mg/dL (ref 8.9–10.3)
Creatinine, Ser: 0.81 mg/dL (ref 0.44–1.00)
Glucose, Bld: 96 mg/dL (ref 65–99)
POTASSIUM: 3.7 mmol/L (ref 3.5–5.1)
SODIUM: 141 mmol/L (ref 135–145)
Total Protein: 6.6 g/dL (ref 6.5–8.1)

## 2014-09-02 LAB — CBC WITH DIFFERENTIAL/PLATELET
Basophils Absolute: 0 10*3/uL (ref 0.0–0.1)
Basophils Relative: 0 % (ref 0–1)
EOS ABS: 0.1 10*3/uL (ref 0.0–0.7)
Eosinophils Relative: 1 % (ref 0–5)
HCT: 32.1 % — ABNORMAL LOW (ref 36.0–46.0)
Hemoglobin: 10.3 g/dL — ABNORMAL LOW (ref 12.0–15.0)
LYMPHS PCT: 42 % (ref 12–46)
Lymphs Abs: 3.2 10*3/uL (ref 0.7–4.0)
MCH: 27.8 pg (ref 26.0–34.0)
MCHC: 32.1 g/dL (ref 30.0–36.0)
MCV: 86.8 fL (ref 78.0–100.0)
Monocytes Absolute: 0.5 10*3/uL (ref 0.1–1.0)
Monocytes Relative: 7 % (ref 3–12)
NEUTROS ABS: 3.7 10*3/uL (ref 1.7–7.7)
Neutrophils Relative %: 50 % (ref 43–77)
PLATELETS: 219 10*3/uL (ref 150–400)
RBC: 3.7 MIL/uL — AB (ref 3.87–5.11)
RDW: 12.1 % (ref 11.5–15.5)
WBC: 7.6 10*3/uL (ref 4.0–10.5)

## 2014-09-02 MED ORDER — SODIUM CHLORIDE 0.9 % IV BOLUS (SEPSIS)
1000.0000 mL | Freq: Once | INTRAVENOUS | Status: AC
  Administered 2014-09-02: 1000 mL via INTRAVENOUS

## 2014-09-02 NOTE — ED Provider Notes (Signed)
CSN: [id number]     Arrival date & time 09/02/14  2111 History   First MD Initiated Contact with Patient 09/02/14 2125     Chief Complaint  Patient presents with  . Loss of Consciousness     (Consider location/radiation/quality/duration/timing/severity/associated sxs/prior Treatment) HPI Desiree Gutierrez is a 24 y.o. female with a history of WPW, pots, vasovagal syncope, chronic syncopal events, comes in for evaluation of acute single episode. Patient states at approximately 8 PM she was walking to give her partner some papers to sign when she suddenly became dizzy and experienced palpitations "in the next thing I know I passed out". Friend in the room did not witness the fall, but heard patient fall down and was immediately by her side. Reports that she was out for 5-10 minutes. Patient is unsure if she hit her head. Friend also reports patient was slightly confused when she woke back up, but this has resolved. Patient denies chest pain, shortness of breath, nausea or vomiting, loss of bowel or bladder function, intraoral injury. Patient reports mild neck and jaw discomfort at this time. No other aggravating or modifying factors.  Past Medical History  Diagnosis Date  . WPW (Wolff-Parkinson-White syndrome)     a. s/p ablation (2011) Dr Gevena Cotton at [facility] (posterior lateral pathway)  . Vasovagal syncope     a. positive tilt 2011 b. failed medical therapy with Florinef, Midodrine, Inderal, Celexa  . POTS (postural orthostatic tachycardia syndrome)   . Palpitations     a. s/p MDT ILR implanted by Dr Alfonso Ellis [facility])  . Headache   . Syncope     recurrent, daily   Past Surgical History  Procedure Laterality Date  . Cardiac electrophysiology study and ablation  2011    Dr. Gevena Cotton at [facility]- left posterior pathway ablation for WPW  . Tilt table study  2011    neurally mediated syncope  . Loop recorder implant  01/22/2014    MDT LINQ implanted by Dr Alfonso Ellis at [facility] for evaluation of  palpitations/syncope   Family History  Problem Relation Age of Onset  . Diabetes Mother   . Hypertension Mother   . Stroke Mother   . Hypertension Brother    History  Substance Use Topics  . Smoking status: Never Smoker   . Smokeless tobacco: Not on file  . Alcohol Use: No   OB History    No data available     Review of Systems A 10 point review of systems was completed and was negative except for pertinent positives and negatives as mentioned in the history of present illness     Allergies  Amitiza; Ketorolac; Metoclopramide; Midodrine; and Fludrocortisone  Home Medications   Prior to Admission medications   Medication Sig Start Date End Date Taking? Authorizing Provider  iron polysaccharides (NIFEREX) 150 MG capsule Take 1 capsule (150 mg total) by mouth daily. Patient not taking: Reported on 07/30/2014 06/01/14   Yolanda Manges, DO   BP 109/64 mmHg  Pulse 98  Temp(Src) 99 F (37.2 C) (Oral)  Resp 18  Wt 132 lb (59.875 kg)  SpO2 100%  LMP 08/22/2014 Physical Exam  Constitutional: She is oriented to person, place, and time. She appears well-developed and well-nourished.  HENT:  Head: Normocephalic and atraumatic.  Mouth/Throat: Oropharynx is clear and moist.  Eyes: Conjunctivae are normal. Pupils are equal, round, and reactive to light. Right eye exhibits no discharge. Left eye exhibits no discharge. No scleral icterus.  Neck: Neck supple.  Cardiovascular: Normal rate,  regular rhythm and normal heart sounds.   Pulmonary/Chest: Effort normal and breath sounds normal. No respiratory distress. She has no wheezes. She has no rales.  Abdominal: Soft. There is no tenderness.  Musculoskeletal: She exhibits no tenderness.  Patient is able to actively range cervical spine 45 left and right without difficulty. No bony step-offs or crepitus. Mild midline bony tenderness in superior C-spine.  Neurological: She is alert and oriented to person, place, and time.  Cranial  Nerves II-XII grossly intact. Motor and sensation 5/5 in all 4 extremities.  Skin: Skin is warm and dry. No rash noted.  Psychiatric: She has a normal mood and affect.  Nursing note and vitals reviewed.   ED Course  Procedures (including critical care time) Labs Review Labs Reviewed  CBC WITH DIFFERENTIAL/PLATELET - Abnormal; Notable for the following:    RBC 3.70 (*)    Hemoglobin 10.3 (*)    HCT 32.1 (*)    All other components within normal limits  COMPREHENSIVE METABOLIC PANEL - Abnormal; Notable for the following:    ALT 9 (*)    All other components within normal limits  URINALYSIS, ROUTINE W REFLEX MICROSCOPIC (NOT AT [facility]) - Abnormal; Notable for the following:    APPearance CLOUDY (*)    Urobilinogen, UA 2.0 (*)    All other components within normal limits  CBG MONITORING, ED    Imaging Review Ct Cervical Spine Wo Contrast  09/02/2014   CLINICAL DATA:  Acute fall today with neck injury and cervical spine pain. Initial encounter.  EXAM: CT CERVICAL SPINE WITHOUT CONTRAST  TECHNIQUE: Multidetector CT imaging of the cervical spine was performed without intravenous contrast. Multiplanar CT image reconstructions were also generated.  COMPARISON:  07/30/2014 and prior exams.  FINDINGS: Straightening of the normal cervical lordosis again noted.  There is no evidence of acute fracture, subluxation or prevertebral soft tissue swelling.  The disc spaces are maintained.  No focal bony lesions are present.  The soft tissue structures are unremarkable.  IMPRESSION: No static evidence of acute injury to the cervical spine.   Electronically Signed   By: Harmon PierJeffrey  Hu M.D.   On: 09/02/2014 23:12     EKG Interpretation   Date/Time:  Tuesday Sep 02 2014 21:46:38 EDT Ventricular Rate:  96 PR Interval:  122 QRS Duration: 78 QT Interval:  336 QTC Calculation: 425 R Axis:   66 Text Interpretation:  Sinus rhythm T wave abnormality since last tracing  no significant change Confirmed by Hyacinth MeekerMILLER   MD, BRIAN ([id number]) on 09/03/2014  12:12:10 AM     Meds given in ED:  Medications  sodium chloride 0.9 % bolus 1,000 mL (1,000 mLs Intravenous New Bag/Given 09/02/14 2206)    New Prescriptions   No medications on file   Filed Vitals:   09/02/14 2117  BP: 109/64  Pulse: 98  Temp: 99 F (37.2 C)  TempSrc: Oral  Resp: 18  Weight: 132 lb (59.875 kg)  SpO2: 100%    MDM  Vitals stable - WNL -afebrile Pt resting comfortably in ED. Is asymptomatic in the ED. PE--physical exam is grossly unremarkable. Labwork--hemoglobin of 10.3, but this appears to be baseline for patient. Labs otherwise noncontributory. EKG is not concerning.  Interrogation of Loop recorder shows no events or abnormalities since last interrogation on 5/24. Imaging--CT cervical spine shows no acute fracture or dislocation. C-spine cleared and cervical collar removed.  DDX--patient here for evaluation of syncope similar to her previous episodes. This appears to be a chronic  problem. Multiple evaluations in the ED for similar events with normal workup. There does not appear to be any acute or emergent cause for syncopal episode today. Discussed ED course results with patient and encouraged follow-up with her cardiologist, Dr. Alfonso Ellis for further evaluation and management of symptoms.  I discussed all relevant lab findings and imaging results with pt and they verbalized understanding. Discussed f/u with PCP within 48 hrs and return precautions, pt very amenable to plan. Prior to patient discharge, I discussed and reviewed this case with Dr. Hyacinth Meeker   Final diagnoses:  Loss of consciousness        Joycie Peek, PA-C 09/03/14 0013  Eber Hong, MD 09/03/14 [phone]

## 2014-09-02 NOTE — ED Notes (Signed)
Per EMS patient lost consciousness at home fell and hit her head. Patient's head is immobilized and C-collar.

## 2014-09-03 ENCOUNTER — Encounter (HOSPITAL_COMMUNITY): Payer: Self-pay | Admitting: Emergency Medicine

## 2014-09-03 ENCOUNTER — Emergency Department (HOSPITAL_COMMUNITY)
Admission: EM | Admit: 2014-09-03 | Discharge: 2014-09-03 | Disposition: A | Discharge status: Home / Self Care | Payer: Self-pay | Attending: Emergency Medicine | Admitting: Emergency Medicine

## 2014-09-03 ENCOUNTER — Emergency Department (HOSPITAL_COMMUNITY): Payer: Self-pay

## 2014-09-03 DIAGNOSIS — Z3202 Encounter for pregnancy test, result negative: Secondary | ICD-10-CM | POA: Insufficient documentation

## 2014-09-03 DIAGNOSIS — Z8679 Personal history of other diseases of the circulatory system: Secondary | ICD-10-CM | POA: Insufficient documentation

## 2014-09-03 DIAGNOSIS — R55 Syncope and collapse: Secondary | ICD-10-CM | POA: Insufficient documentation

## 2014-09-03 LAB — BASIC METABOLIC PANEL
ANION GAP: 8 (ref 5–15)
BUN: 7 mg/dL (ref 6–20)
CHLORIDE: 107 mmol/L (ref 101–111)
CO2: 25 mmol/L (ref 22–32)
Calcium: 9.3 mg/dL (ref 8.9–10.3)
Creatinine, Ser: 0.78 mg/dL (ref 0.44–1.00)
GFR calc non Af Amer: >60 mL/min (ref >60)
Glucose, Bld: 80 mg/dL (ref 65–99)
POTASSIUM: 3.9 mmol/L (ref 3.5–5.1)
Sodium: 140 mmol/L (ref 135–145)

## 2014-09-03 LAB — RAPID URINE DRUG SCREEN, HOSP PERFORMED
Amphetamines: NOT DETECTED
Barbiturates: NOT DETECTED
Benzodiazepines: NOT DETECTED
Cocaine: NOT DETECTED
OPIATES: NOT DETECTED
TETRAHYDROCANNABINOL: NOT DETECTED

## 2014-09-03 LAB — URINALYSIS, ROUTINE W REFLEX MICROSCOPIC
Bilirubin Urine: NEGATIVE
Glucose, UA: NEGATIVE mg/dL
Hgb urine dipstick: NEGATIVE
Ketones, ur: NEGATIVE mg/dL
LEUKOCYTES UA: NEGATIVE
NITRITE: NEGATIVE
PROTEIN: NEGATIVE mg/dL
SPECIFIC GRAVITY, URINE: 1.002 — AB (ref 1.005–1.030)
Urobilinogen, UA: 0.2 mg/dL (ref 0.0–1.0)
pH: 7.5 (ref 5.0–8.0)

## 2014-09-03 LAB — CBC WITH DIFFERENTIAL/PLATELET
Basophils Absolute: 0 10*3/uL (ref 0.0–0.1)
Basophils Relative: 1 % (ref 0–1)
EOS PCT: 1 % (ref 0–5)
Eosinophils Absolute: 0.1 10*3/uL (ref 0.0–0.7)
HCT: 32.7 % — ABNORMAL LOW (ref 36.0–46.0)
HEMOGLOBIN: 10.6 g/dL — AB (ref 12.0–15.0)
LYMPHS ABS: 2.9 10*3/uL (ref 0.7–4.0)
LYMPHS PCT: 47 % — AB (ref 12–46)
MCH: 28.3 pg (ref 26.0–34.0)
MCHC: 32.4 g/dL (ref 30.0–36.0)
MCV: 87.2 fL (ref 78.0–100.0)
Monocytes Absolute: 0.4 10*3/uL (ref 0.1–1.0)
Monocytes Relative: 6 % (ref 3–12)
NEUTROS ABS: 2.8 10*3/uL (ref 1.7–7.7)
Neutrophils Relative %: 45 % (ref 43–77)
PLATELETS: 213 10*3/uL (ref 150–400)
RBC: 3.75 MIL/uL — ABNORMAL LOW (ref 3.87–5.11)
RDW: 12.2 % (ref 11.5–15.5)
WBC: 6.3 10*3/uL (ref 4.0–10.5)

## 2014-09-03 LAB — I-STAT BETA HCG BLOOD, ED (MC, WL, AP ONLY): I-stat hCG, quantitative: <5 m[IU]/mL (ref <5)

## 2014-09-03 NOTE — Discharge Instructions (Signed)
Do not hesitate to return to the emergency room for any new, worsening or concerning symptoms.  Please obtain primary care using resource guide below. Let them know that you were seen in the emergency room and that they will need to obtain records for further outpatient management.    Driving and Equipment Restrictions Some medical problems make it dangerous to drive, ride a bike, or use machines. Some of these problems are:  A hard blow to the head (concussion).  Passing out (fainting).  Twitching and shaking (seizures).  Low blood sugar.  Taking medicine to help you relax (sedatives).  Taking pain medicines.  Wearing an eye patch.  Wearing splints. This can make it hard to use parts of your body that you need to drive safely. HOME CARE   Do not drive until your doctor says it is okay.  Do not use machines until your doctor says it is okay. You may need a form signed by your doctor (medical release) before you can drive again. You may also need this form before you do other tasks where you need to be fully alert. MAKE SURE YOU:  Understand these instructions.  Will watch your condition.  Will get help right away if you are not doing well or get worse. Document Released: 04/28/2004 Document Revised: 06/13/2011 Document Reviewed: 07/29/2009 [facility] Patient Information 2015 [place], [place]. This information is not intended to replace advice given to you by your health care provider. Make sure you discuss any questions you have with your health care provider.  Emergency Department Resource Guide 1) Find a Doctor and Pay Out of Pocket Although you won't have to find out who is covered by your insurance plan, it is a good idea to ask around and get recommendations. You will then need to call the office and see if the doctor you have chosen will accept you as a new patient and what types of options they offer for patients who are self-pay. Some doctors offer discounts or will set  up payment plans for their patients who do not have insurance, but you will need to ask so you aren't surprised when you get to your appointment.  2) Contact Your Local Health Department Not all health departments have doctors that can see patients for sick visits, but many do, so it is worth a call to see if yours does. If you don't know where your local health department is, you can check in your phone book. The CDC also has a tool to help you locate your state's health department, and many state websites also have listings of all of their local health departments.  3) Find a Walk-in Clinic If your illness is not likely to be very severe or complicated, you may want to try a walk in clinic. These are popping up all over the country in pharmacies, drugstores, and shopping centers. They're usually staffed by nurse practitioners or physician assistants that have been trained to treat common illnesses and complaints. They're usually fairly quick and inexpensive. However, if you have serious medical issues or chronic medical problems, these are probably not your best option.  No Primary Care Doctor: - Call Health Connect at  [phone] - they can help you locate a primary care doctor that  accepts your insurance, provides certain services, etc. - Physician Referral Service- [phone]  Chronic Pain Problems: Organization         Address  Phone   Notes  [facility]  [phone] Patients  need to be referred by their primary care doctor.   Medication Assistance: Organization         Address  Phone   Notes  [facility] [address] [place], [place] [address] [phone] --Must be a resident of [facility] -- Must have NO insurance coverage whatsoever (no Medicaid/ Medicare, etc.) -- The pt. MUST have a primary care doctor that directs their care regularly and follows them in the community   Med[facility]ist  [phone]) [phone]    [employer]  [phone]    Agencies that provide inexpensive medical care: Organization         Address  Phone   Notes  [facility]  [phone]) [phone]   [facility]    [phone]) [phone]   [facility] [address] [place], [place] [address] [phone]) [phone]   [facility] [address], [place] [phone]   Planned Parenthood    [phone](336) [phone]   [facility]    [phone]   [facility]  [address], [place] Phone:  [phone], Fax:  [phone] Hours of Operation:  9 am - 6 pm, M-F.  Also accepts Medicaid/Medicare and self-pay.  [facility]  [address], [place] Phone: [phone](336) [phone], Fax: [phone](336) [phone]. Hours of Operation:  8:30 am - 5:30 pm, M-F.  Also accepts Medicaid and self-pay.  [facility] [place] [address], [place] Phone: [phone]) [phone]   [facility] [address], [place], [place] [phone], Ext. [phone] Mondays & Thursdays: 7-9 AM.  First 15 patients are seen on a first come, first serve basis.    Medicaid-accepting [facility] Providers:  Organization         Address  Phone   Notes  [facility] [address], [place] [phone]) [phone] Also accepts self-pay patients.  [facility] [address], [place]  [phone]) [phone]   [facility] [address], [place] [phone]) [phone]   [facility] [address] [place] [address], [place] [phone]) [phone]   Renaye RakersVeita Bland [address], [place]   [phone] Only accepts [place] Access [place] patients after they have their name applied to their card.   Self-Pay (no insurance) in [place] [facility] [place]:  Organization         Address  Phone   Notes  Sickle Cell Patients, [facility] [address],  [place] [phone]   [facility] [address], [place] [phone]) [phone]   [facility] [place]  [address], [place] [phone]   [facility]/Dr. Osei-Bonsu  [address] [place] [address], [place] or [phone], [place] [phone] Phone number for both [place] and [place] locations is the same.  [facility] [address], [place] [phone]   [facility] [address] [place] [address], [place] or [address] [phone]   [facility] [address], [place] [phone]) [phone], phone; [phone](336) [phone], fax Sees patients 1st and 3rd Saturday of every month.  Must not qualify for public or private insurance (i.e. Medicaid, Medicare, [place] Health Choice, Veterans' Benefits)  Household income  should be no more than 200% of the poverty level The clinic cannot treat you if you are pregnant or think you are pregnant  Sexually transmitted diseases are not treated at the clinic.    Dental Care: Organization         Address  Phone  Notes  [facility] [address] [place], [place] [phone] Accepts children up to age 30 who are enrolled in [place] or [place] Health Choice; pregnant women with a Medicaid card; and children who have applied for Medicaid or [place] Health Choice, but were declined, whose parents can pay a reduced fee at time of service.  [facility] Department of [facility]  [address], [place] [phone] Accepts children up to age 22 who are enrolled in [place] or [place] Health Choice; pregnant women with a Medicaid card; and children who have applied for Medicaid or [place] Health Choice, but were declined, whose parents can pay a reduced fee at time of service.  [facility]  [address] [place], [place] [phone] Patients are seen by appointment only. Walk-ins are not accepted. [facility] will see patients 37 years of age and older. Monday - Tuesday (8am-5pm) Most Wednesdays (8:30-5pm) $30 per visit, cash only  [facility]  [address], [facility] [phone] Patients are seen by appointment only. Walk-ins are not accepted. [facility] will see patients 71 years of age and older. One Wednesday Evening (Monthly: Volunteer Based).  $30 per visit, cash only  [employer]  [phone] for adults; Children under age 5, call Graduate Pediatric Dentistry at [phone]. Children aged 33-14, please call [phone] to request a pediatric application.  Dental services are provided in all areas of dental care including fillings, crowns and bridges, complete and partial dentures, implants, gum treatment, root canals, and extractions. Preventive care is also provided. Treatment is provided to both adults and children. Patients are selected via a lottery and there is often a waiting list.   [facility] [address] [place]  [phone] [email or web address]   [facility] [address] [place], [place] [phone], Ext. [phone] Second and Fourth Thursday of each month, opens at 6:30 AM; Clinic ends at 9 AM.  Patients are seen on a first-come first-served basis, and a limited number are seen during each clinic.   [facility]  [address] [place], [place] [phone]   Eligibility Requirements You must have lived in [place], [place], or [place] counties for at least the last three months.   You cannot be eligible for state or federal sponsored [place], including [employer], [place], or [employer].   You generally cannot be eligible for healthcare insurance through your employer.    How to apply: Eligibility screenings are held every Tuesday and Wednesday afternoon  from 1:00 pm until 4:00 pm. You do not need an appointment for the interview!  [facility] [address], [place], [place] [phone]   [facility] Health Department  [phone]   [facility] Health Department  [phone]   [facility] Health Department  [phone]    Behavioral Health Resources in the Community: Intensive Outpatient Programs Organization         Address  Phone  Notes  [facility] [address], [place], [place] [phone]  [facility] [address], [place], [place] [phone]   [facility] [address], [place], [place]  [phone]   [facility] [address],  [place], [place] [phone] or [phone]   Substance Abuse Resources Organization         Address  Phone  Notes  [facility]  [phone]   [facility]  [phone]   [facility]  [phone]   [facility]  [phone]   Residential & Outpatient Substance Abuse Program  [phone]   Psychological Services Organization         Address  Phone  Notes  [facility]  [phone]- [phone]   [facility]  [phone]- [phone]   [facility] [address], [place] [phone] or [phone]    Mobile Crisis Teams Organization         Address  Phone  Notes  [facility], Mobile Crisis Care Unit  [phone]   [facility]  [address] [place], [place] [phone]   Doristine LocksSharon DeEsch [address] [place] [phone]    Self-Help/Support Groups Organization         Address  Phone             Notes  [facility] [place] - variety of support groups  [phone] Call for more information  Narcotics Anonymous (NA), [facility] [address], [place]  2 meetings at this location   Nutritional therapistesidential Treatment  Programs Organization         Address  Phone  Notes  [facility] [address],    [place]  [phone]   [facility]  [address], [place] [phone], [place], [place] [phone]   [facility] [address], [place] [phone] Admissions: 8am-3pm M-F  [facility] [address]    [place], [place] [phone]   [facility] [address], [place], [place] [phone]   [facility] [address]  [place], [place] [phone]   [facility] - Intensive Outpatient [address], [place], [place] [phone]   [facility] ([facility] [address]  [place], [place] [phone] or [phone]   [facility] ([facility]) [address] [place], [place] [phone] Accepts Medicaid  [facility] [address]  [place] [phone] Substance Abuse/Addiction Treatment   [facility] Resources Organization         Address  Phone  Notes  [facility]  [phone]   Angie FavaJulie Brannon, PhD [address], [address] [place], [place]   [phone] or [phone]) [phone]   [facility]   [address] [place], [place] [phone]) [phone]   [facility] [address], [place], [place] [phone]) [phone] Insurance/Medicaid/sponsorship through [facility]  Faith and Families [address]                                    [place], [place] [phone]) [phone] Therapy/tele-psych/case  [facility] [address]   [place], [place] [phone](336) [phone]    Dr. Lolly MustacheArfeen  [phone]   [facility]  [employer] [facility] 1) [address],  2) [address], [place] 3)  [place], [place] [phone]  [phone]   [employer] Child Abuse Hotline 604-544-7534 or [phone] (After Hours)

## 2014-09-03 NOTE — ED Provider Notes (Signed)
CSN: [id number]     Arrival date & time 09/03/14  2050 History   First MD Initiated Contact with Patient 09/03/14 2104     Chief Complaint  Patient presents with  . Loss of Consciousness     (Consider location/radiation/quality/duration/timing/severity/associated sxs/prior Treatment) HPI   Desiree Gutierrez is a 24 y.o. female with past medical history significant for Wolff-Parkinson-White (status post ablation),pots, recurrent syncope complaining of syncopal episode just prior to arrival. Episode was witnessed by a friend who accompanies her and provides part of the history. Patient states she was using the restroom, urinating approximately 6:30 PM she states that her chest started feeling tight, she had back pain, shortness of breath and diaphoresis. She got up to leave the restroom and continued to have these symptoms, her friend states that she returned to the restroom 3 times to urinate in the next 20 minutes, her friend called this patient's cardiologist and while on the film with the cardiologist she states that her friend became nonresponsive. She was sitting on the back room floor she did not fall but her friend lowered her to the ground, there was no head trauma, there was no tonic-clonic movements, no incontinence. She states that she was nonresponsive for about 7 minutes, there was no postictal confusion. According to patient this is typical for her vasovagal symptoms syncope except for she normally does not have diaphoresis. He is drinking approximately 4 L of water per day which is typical for her. Patient has no complaints at this time: Denies any pain, nausea, confusion.      Past Medical History  Diagnosis Date  . WPW (Wolff-Parkinson-White syndrome)     a. s/p ablation (2011) Dr Gevena Cotton at [facility] (posterior lateral pathway)  . Vasovagal syncope     a. positive tilt 2011 b. failed medical therapy with Florinef, Midodrine, Inderal, Celexa  . POTS (postural orthostatic tachycardia  syndrome)   . Palpitations     a. s/p MDT ILR implanted by Dr Alfonso Ellis [facility])  . Headache   . Syncope     recurrent, daily   Past Surgical History  Procedure Laterality Date  . Cardiac electrophysiology study and ablation  2011    Dr. Gevena Cotton at [facility]- left posterior pathway ablation for WPW  . Tilt table study  2011    neurally mediated syncope  . Loop recorder implant  01/22/2014    MDT LINQ implanted by Dr Alfonso Ellis at [facility] for evaluation of palpitations/syncope   Family History  Problem Relation Age of Onset  . Diabetes Mother   . Hypertension Mother   . Stroke Mother   . Hypertension Brother    History  Substance Use Topics  . Smoking status: Never Smoker   . Smokeless tobacco: Not on file  . Alcohol Use: No   OB History    No data available     Review of Systems  10 systems reviewed and found to be negative, except as noted in the HPI.  Allergies  Amitiza; Ketorolac; Metoclopramide; Midodrine; and Fludrocortisone  Home Medications   Prior to Admission medications   Not on File   BP 123/88 mmHg  Pulse 81  Temp(Src) 98.3 F (36.8 C) (Oral)  Resp 15  Ht  (1.6 m)  Wt 132 lb (59.875 kg)  BMI 23.39 kg/m2  SpO2 100%  LMP 08/22/2014 Physical Exam  Constitutional: She is oriented to person, place, and time. She appears well-developed and well-nourished.  HENT:  Head: Normocephalic and atraumatic.  Mouth/Throat: Oropharynx is  clear and moist.  No intraoral trauma  Eyes: Conjunctivae and EOM are normal. Pupils are equal, round, and reactive to light.  Neck: Normal range of motion. Neck supple.  FROM to C-spine. Pt can touch chin to chest without discomfort. No TTP of midline cervical spine.   Cardiovascular: Normal rate, regular rhythm and intact distal pulses.   Pulmonary/Chest: Effort normal and breath sounds normal. No respiratory distress. She has no wheezes. She has no rales. She exhibits no tenderness.  Abdominal: Soft. Bowel sounds are  normal. There is no tenderness.  Musculoskeletal: Normal range of motion. She exhibits no edema or tenderness.  Neurological: She is alert and oriented to person, place, and time. No cranial nerve deficit.  II-Visual fields grossly intact. III/IV/VI-Extraocular movements intact.  Pupils reactive bilaterally. V/VII-Smile symmetric, equal eyebrow raise,  facial sensation intact VIII- Hearing grossly intact IX/X-Normal gag XI-bilateral shoulder shrug XII-midline tongue extension Motor: 5/5 bilaterally with normal tone and bulk Cerebellar: Normal finger-to-nose  and normal heel-to-shin test.   Romberg negative Ambulates with a coordinated gait   Nursing note and vitals reviewed.   ED Course  Procedures (including critical care time) Labs Review Labs Reviewed  CBC WITH DIFFERENTIAL/PLATELET - Abnormal; Notable for the following:    RBC 3.75 (*)    Hemoglobin 10.6 (*)    HCT 32.7 (*)    Lymphocytes Relative 47 (*)    All other components within normal limits  URINALYSIS, ROUTINE W REFLEX MICROSCOPIC (NOT AT [facility]) - Abnormal; Notable for the following:    Specific Gravity, Urine 1.002 (*)    All other components within normal limits  BASIC METABOLIC PANEL  URINE RAPID DRUG SCREEN (HOSP PERFORMED) NOT AT [facility]  I-STAT BETA HCG BLOOD, ED ([facility], [facility], [facility] ONLY)    Imaging Review Dg Chest 2 View  09/03/2014   CLINICAL DATA:  Acute onset of loss of consciousness. Diaphoresis and chest tightness. Pain between the shoulder blades. Shortness of breath. Initial encounter.  EXAM: CHEST  2 VIEW  COMPARISON:  Chest radiograph performed 08/26/2014  FINDINGS: The lungs are well-aerated and clear. There is no evidence of focal opacification, pleural effusion or pneumothorax.  The heart is normal in size; the mediastinal contour is within normal limits. A small metallic device is noted at the left chest wall. No acute osseous abnormalities are seen.  IMPRESSION: No acute cardiopulmonary process seen.    Electronically Signed   By: Roanna RaiderJeffery  Chang M.D.   On: 09/03/2014 21:48   Ct Cervical Spine Wo Contrast  09/02/2014   CLINICAL DATA:  Acute fall today with neck injury and cervical spine pain. Initial encounter.  EXAM: CT CERVICAL SPINE WITHOUT CONTRAST  TECHNIQUE: Multidetector CT imaging of the cervical spine was performed without intravenous contrast. Multiplanar CT image reconstructions were also generated.  COMPARISON:  07/30/2014 and prior exams.  FINDINGS: Straightening of the normal cervical lordosis again noted.  There is no evidence of acute fracture, subluxation or prevertebral soft tissue swelling.  The disc spaces are maintained.  No focal bony lesions are present.  The soft tissue structures are unremarkable.  IMPRESSION: No static evidence of acute injury to the cervical spine.   Electronically Signed   By: Harmon PierJeffrey  Hu M.D.   On: 09/02/2014 23:12     EKG Interpretation   Date/Time:  Wednesday September 03 2014 20:57:54 EDT Ventricular Rate:  76 PR Interval:  127 QRS Duration: 78 QT Interval:  363 QTC Calculation: 408 R Axis:   82 Text Interpretation:  Sinus rhythm Since last tracing T wave abnormality  has resolved Confirmed by [facility]  MD, ELLIOTT [phone]) on 09/03/2014 9:03:12  PM      MDM   Final diagnoses:  Syncope    Filed Vitals:   09/03/14 2059 09/03/14 2100 09/03/14 2101 09/03/14 2200  BP:  109/76 110/76 123/88  Pulse:  80 81 81  Temp:   98.3 F (36.8 C)   TempSrc:   Oral   Resp:  Height:  (1.6 m)     Weight: 132 lb (59.875 kg)     SpO2:  100% 100% 100%    Desiree Gutierrez is a pleasant 24 y.o. female presenting with witnessed syncopal event. Patient has past medical history significant for postural orthostatic hypotension, her friend states that she was unconscious for about 6 minutes. Neuro exam is nonfocal, no signs of head trauma. EKG unchanged. Blood work with no significant abnormality. Patient's cardiologist at [facility] is concerned that she is  having an atypical seizure. Patient was given a referral at [facility] to neurology but she did not follow-up because she is uninsured and would have to pay upfront out of pocket.  There was no tonic-clonic movement, no post ictal confusion. No signs of intraoral trauma, no incontinence.  Neuro consult from Dr. Amada Jupiter appreciated: States that patient is appropriate for outpatient neurologic evaluation. Patient already does not drive secondary to her recurrent syncope. Patient will be given referral to [facility] neurology, return precautions discussed.  Discussed case with attending MD who agrees with plan and stability to d/c to home.   Evaluation does not show pathology that would require ongoing emergent intervention or inpatient treatment. Pt is hemodynamically stable and mentating appropriately. Discussed findings and plan with patient/guardian, who agrees with care plan. All questions answered. Return precautions discussed and outpatient follow up given.      Wynetta Emery, PA-C 09/03/14 2322  Mancel Bale, MD 09/04/14 0040

## 2014-09-03 NOTE — Discharge Instructions (Signed)
You were evaluated in the ED today for your loss of consciousness. There does not appear to be an emergent cause for your symptoms at this time. Her labs and exam today were reassuring. The CT scan of your neck showed no fractures or dislocations. Her EKG was reassuring as well as the interrogation of your loop recorder. It is important to follow-up with your PCP/cardiologist for further evaluation and management of your symptoms. Return to ED for new or worsening symptoms.

## 2014-09-03 NOTE — ED Notes (Signed)
Per EMS- Pt here with syncopal episode while sitting on bathroom floor. Pt was seen here for same yesterday. Pt reports CP, SOB, diaphoresis just prior to syncopal episode. Pt reports central chest tightness at this time. Pt has hx of WPW and cardiac ablation.

## 2014-09-07 ENCOUNTER — Other Ambulatory Visit: Payer: Self-pay

## 2014-09-07 ENCOUNTER — Emergency Department (HOSPITAL_COMMUNITY)
Admission: EM | Admit: 2014-09-07 | Discharge: 2014-09-07 | Disposition: A | Discharge status: Home / Self Care | Payer: Self-pay | Attending: Emergency Medicine | Admitting: Emergency Medicine

## 2014-09-07 ENCOUNTER — Encounter (HOSPITAL_COMMUNITY): Payer: Self-pay | Admitting: Nurse Practitioner

## 2014-09-07 ENCOUNTER — Emergency Department (HOSPITAL_COMMUNITY): Payer: Self-pay

## 2014-09-07 DIAGNOSIS — R55 Syncope and collapse: Secondary | ICD-10-CM | POA: Insufficient documentation

## 2014-09-07 DIAGNOSIS — Z8679 Personal history of other diseases of the circulatory system: Secondary | ICD-10-CM | POA: Insufficient documentation

## 2014-09-07 DIAGNOSIS — R079 Chest pain, unspecified: Secondary | ICD-10-CM | POA: Insufficient documentation

## 2014-09-07 LAB — I-STAT CHEM 8, ED
BUN: 10 mg/dL (ref 6–20)
CALCIUM ION: 1.24 mmol/L — AB (ref 1.12–1.23)
Chloride: 104 mmol/L (ref 101–111)
Creatinine, Ser: 0.8 mg/dL (ref 0.44–1.00)
GLUCOSE: 68 mg/dL (ref 65–99)
HEMATOCRIT: 42 % (ref 36.0–46.0)
HEMOGLOBIN: 14.3 g/dL (ref 12.0–15.0)
POTASSIUM: 3.9 mmol/L (ref 3.5–5.1)
SODIUM: 140 mmol/L (ref 135–145)
TCO2: 22 mmol/L (ref 0–100)

## 2014-09-07 NOTE — Discharge Instructions (Signed)
Syncope °Syncope is a medical term for fainting or passing out. This means you lose consciousness and drop to the ground. People are generally unconscious for less than 5 minutes. You may have some muscle twitches for up to 15 seconds before waking up and returning to normal. Syncope occurs more often in older adults, but it can happen to anyone. While most causes of syncope are not dangerous, syncope can be a sign of a serious medical problem. It is important to seek medical care.  °CAUSES  °Syncope is caused by a sudden drop in blood flow to the brain. The specific cause is often not determined. Factors that can bring on syncope include: °· Taking medicines that lower blood pressure. °· Sudden changes in posture, such as standing up quickly. °· Taking more medicine than prescribed. °· Standing in one place for too long. °· Seizure disorders. °· Dehydration and excessive exposure to heat. °· Low blood sugar (hypoglycemia). °· Straining to have a bowel movement. °· Heart disease, irregular heartbeat, or other circulatory problems. °· Fear, emotional distress, seeing blood, or severe pain. °SYMPTOMS  °Right before fainting, you may: °· Feel dizzy or light-headed. °· Feel nauseous. °· See all white or all black in your field of vision. °· Have cold, clammy skin. °DIAGNOSIS  °Your health care provider will ask about your symptoms, perform a physical exam, and perform an electrocardiogram (ECG) to record the electrical activity of your heart. Your health care provider may also perform other heart or blood tests to determine the cause of your syncope which may include: °· Transthoracic echocardiogram (TTE). During echocardiography, sound waves are used to evaluate how blood flows through your heart. °· Transesophageal echocardiogram (TEE). °· Cardiac monitoring. This allows your health care provider to monitor your heart rate and rhythm in real time. °· Holter monitor. This is a portable device that records your  heartbeat and can help diagnose heart arrhythmias. It allows your health care provider to track your heart activity for several days, if needed. °· Stress tests by exercise or by giving medicine that makes the heart beat faster. °TREATMENT  °In most cases, no treatment is needed. Depending on the cause of your syncope, your health care provider may recommend changing or stopping some of your medicines. °HOME CARE INSTRUCTIONS °· Have someone stay with you until you feel stable. °· Do not drive, use machinery, or play sports until your health care provider says it is okay. °· Keep all follow-up appointments as directed by your health care provider. °· Lie down right away if you start feeling like you might faint. Breathe deeply and steadily. Wait until all the symptoms have passed. °· Drink enough fluids to keep your urine clear or pale yellow. °· If you are taking blood pressure or heart medicine, get up slowly and take several minutes to sit and then stand. This can reduce dizziness. °SEEK IMMEDIATE MEDICAL CARE IF:  °· You have a severe headache. °· You have unusual pain in the chest, abdomen, or back. °· You are bleeding from your mouth or rectum, or you have black or tarry stool. °· You have an irregular or very fast heartbeat. °· You have pain with breathing. °· You have repeated fainting or seizure-like jerking during an episode. °· You faint when sitting or lying down. °· You have confusion. °· You have trouble walking. °· You have severe weakness. °· You have vision problems. °If you fainted, call your local emergency services (911 in [place] Do not drive   yourself to the hospital.  °MAKE SURE YOU: °· Understand these instructions. °· Will watch your condition. °· Will get help right away if you are not doing well or get worse. °Document Released: 03/21/2005 Document Revised: 03/26/2013 Document Reviewed: 05/20/2011 °ExitCare® Patient Information ©2015 [employer]. This information is not intended to replace  advice given to you by your health care provider. Make sure you discuss any questions you have with your health care provider. ° °

## 2014-09-07 NOTE — ED Notes (Addendum)
RN attempted IV access without success. No blood return.  [facility]'t need [facility] at this time.

## 2014-09-07 NOTE — ED Notes (Signed)
Phlebotomy at bedside.

## 2014-09-07 NOTE — ED Provider Notes (Signed)
CSN: [id number]     Arrival date & time 09/07/14  1455 History   First MD Initiated Contact with Patient 09/07/14 1505     Chief Complaint  Patient presents with  . Loss of Consciousness  . Chest Pain     (Consider location/radiation/quality/duration/timing/severity/associated sxs/prior Treatment) HPI   Desiree Gutierrez is a 24 y.o. female who presents for evaluation of recurrent syncopal episodes. She probably had another syncopal episode while she was at home today. There was no known inciting factors. On arrival there was no witness with her to explain what happened. She has been evaluated 3 times in the ED for similar problem this week, and has been referred to neurology, for seizure evaluation. I [facility] last week about the situation. She states she is taking her usual medications, and eating well. There are no other known modifying factors.   Past Medical History  Diagnosis Date  . WPW (Wolff-Parkinson-White syndrome)     a. s/p [facility] (2011) [facility] ([facility])  . Vasovagal syncope     a. positive tilt 2011 b. failed medical therapy with Florinef, Midodrine, Inderal, Celexa  . POTS (postural orthostatic tachycardia syndrome)   . Palpitations     a. s/p MDT ILR implanted by [facility]  . Headache   . Syncope     recurrent, daily   Past Surgical History  Procedure Laterality Date  . Cardiac electrophysiology study and ablation  2011    [facility] pathway [facility] for WPW  . Tilt table study  2011    neurally mediated syncope  . Loop recorder implant  01/22/2014    MDT LINQ [facility] for evaluation of palpitations/syncope   Family History  Problem Relation Age of Onset  . Diabetes Mother   . Hypertension Mother   . Stroke Mother   . Hypertension Brother    History  Substance Use Topics  . Smoking status: Never Smoker   . Smokeless  tobacco: Not on file  . Alcohol Use: No   OB History    No data available     Review of Systems  All other systems reviewed and are negative.     Allergies  Amitiza; Ketorolac; Metoclopramide; Midodrine; and Fludrocortisone  Home Medications   Prior to Admission medications   Not on File   BP 117/67 mmHg  Pulse 90  Temp(Src) 98.5 F (36.9 C) (Oral)  Resp 24  SpO2 100%  LMP 08/22/2014 Physical Exam  Constitutional: She is oriented to person, place, and time. She appears well-developed and well-nourished.  HENT:  Head: Normocephalic and atraumatic.  Right Ear: External ear normal.  Left Ear: External ear normal.  No injury to head.  Eyes: Conjunctivae and EOM are normal. Pupils are equal, round, and reactive to light.  Neck: Normal range of motion and phonation normal. Neck supple.  Cardiovascular: Normal rate, regular rhythm and normal heart sounds.   Pulmonary/Chest: Effort normal and breath sounds normal. She exhibits no bony tenderness.  Abdominal: Soft. There is no tenderness.  Musculoskeletal:  She is wearing a cervical collar. Cervical collar briefly removed, and she has mild diffuse tenderness, without deformity. So the collar was replaced. She is moving arms and legs normally.  Neurological: She is alert and oriented to person, place, and time. No cranial nerve deficit or sensory deficit. She exhibits normal muscle tone. Coordination normal.  Skin: Skin is  warm, dry and intact.  Psychiatric: She has a normal mood and affect. Her behavior is normal. Judgment and thought content normal.  Nursing note and vitals reviewed.   ED Course  Procedures (including critical care time)  Medications - No data to display  Patient Vitals for the past 24 hrs:  BP Temp Temp src Pulse Resp SpO2  09/07/14 1645 117/67 mmHg - - 90 24 100 %  09/07/14 1630 125/86 mmHg - - 89 12 100 %  09/07/14 1600 128/78 mmHg - - 96 19 100 %  09/07/14 1530 128/79 mmHg - - 87 23 95 %   09/07/14 1515 127/83 mmHg - - 86 21 100 %  09/07/14 1500 120/68 mmHg 98.5 F (36.9 C) Oral 80 20 100 %    5:55 PM Reevaluation with update and discussion. After initial assessment and treatment, an updated evaluation reveals she is comfortable. Cervical collar removed and she demonstrates normal range of motion and neck. She has no additional complaints. There has been no seizures in the emergency department. She has a family member with her now, who was with her when she had the syncopal episode. The family member notes that the patient fell forward, as she passed out. Her eyelids were fluttering, but she is not talking. They rolled over on the back, and she began to awaken the time EMS arrived. No one saw her shake. Kalinda Romaniello L    Labs Review Labs Reviewed  I-STAT CHEM 8, ED - Abnormal; Notable for the following:    Calcium, Ion 1.24 (*)    All other components within normal limits    Imaging Review No results found.   EKG Interpretation   Date/Time:  Sunday September 07 2014 15:09:19 EDT Ventricular Rate:  88 PR Interval:  123 QRS Duration: 80 QT Interval:  350 QTC Calculation: 423 R Axis:   81 Text Interpretation:  Sinus rhythm since last tracing no significant  change Confirmed by Azad Calame  MD, Nadalyn Deringer ([id number]) on 09/07/2014 4:07:47 PM      MDM   Final diagnoses:  Syncope, unspecified syncope type    Nonspecific recurrent syncope in patient with known WPW and POTS. Both of these illnesses, are currently being adequately treated, and event could follow-up. There is no clinical evidence for cardiac abnormality or suspected orthostatic hypotension. There's been no change in her overall status since her last evaluation on 09/03/14. The same discharge instructions will therefore apply, at this time.  Nursing Notes Reviewed/ Care Coordinated Applicable Imaging Reviewed Interpretation of Laboratory Data incorporated into ED treatment  The patient appears reasonably screened and/or  stabilized for discharge and I doubt any other medical condition or other [facility] requiring further screening, evaluation, or treatment in the ED at this time prior to discharge.  Plan: Home Medications- usual; Home Treatments- rest; return here if the recommended treatment, does not improve the symptoms; Recommended follow up- f/u with Neurology asap     Mancel Bale, MD 09/07/14 1759

## 2014-09-07 NOTE — ED Notes (Signed)
Per EMS pt had Hx of POTS and WPW and has had 1 ablation in the past. Pt has been in ED 3 x this past week due to palpitations and syncope. Pt has loop recorder with threshold of 200bpm. Pt endorses chest pain prior to syncopal episode. Pt fell prone, pt endorses neck pain, family rolled pt over, no obvious bruises, step-off, deformity. Syncopal episode lasted appx 1 min, pt mildly lethargic upon ems arrival but able to answer questions appropriately.

## 2014-09-11 ENCOUNTER — Emergency Department (HOSPITAL_COMMUNITY)
Admission: EM | Admit: 2014-09-11 | Discharge: 2014-09-11 | Disposition: A | Discharge status: Home / Self Care | Payer: Self-pay | Attending: Emergency Medicine | Admitting: Emergency Medicine

## 2014-09-11 ENCOUNTER — Encounter (HOSPITAL_COMMUNITY): Payer: Self-pay | Admitting: Emergency Medicine

## 2014-09-11 DIAGNOSIS — Z3202 Encounter for pregnancy test, result negative: Secondary | ICD-10-CM | POA: Insufficient documentation

## 2014-09-11 DIAGNOSIS — R55 Syncope and collapse: Secondary | ICD-10-CM | POA: Insufficient documentation

## 2014-09-11 DIAGNOSIS — Z8679 Personal history of other diseases of the circulatory system: Secondary | ICD-10-CM | POA: Insufficient documentation

## 2014-09-11 LAB — I-STAT CHEM 8, ED
BUN: 7 mg/dL (ref 6–20)
Calcium, Ion: 1.17 mmol/L (ref 1.12–1.23)
Chloride: 104 mmol/L (ref 101–111)
Creatinine, Ser: 0.9 mg/dL (ref 0.44–1.00)
GLUCOSE: 76 mg/dL (ref 65–99)
HCT: 41 % (ref 36.0–46.0)
Hemoglobin: 13.9 g/dL (ref 12.0–15.0)
Potassium: 4 mmol/L (ref 3.5–5.1)
Sodium: 139 mmol/L (ref 135–145)
TCO2: 21 mmol/L (ref 0–100)

## 2014-09-11 LAB — I-STAT BETA HCG BLOOD, ED (MC, WL, AP ONLY)

## 2014-09-11 LAB — CBC
HEMATOCRIT: 37.7 % (ref 36.0–46.0)
HEMOGLOBIN: 12.5 g/dL (ref 12.0–15.0)
MCH: 28.2 pg (ref 26.0–34.0)
MCHC: 33.2 g/dL (ref 30.0–36.0)
MCV: 85.1 fL (ref 78.0–100.0)
PLATELETS: 205 10*3/uL (ref 150–400)
RBC: 4.43 MIL/uL (ref 3.87–5.11)
RDW: 12.1 % (ref 11.5–15.5)
WBC: 5.5 10*3/uL (ref 4.0–10.5)

## 2014-09-11 MED ORDER — SODIUM CHLORIDE 0.9 % IV BOLUS (SEPSIS)
1000.0000 mL | Freq: Once | INTRAVENOUS | Status: AC
  Administered 2014-09-11: 1000 mL via INTRAVENOUS

## 2014-09-11 NOTE — ED Notes (Addendum)
Per EMS- pt presents to ED for evaluation of syncopal episodes, pt was seen here Sunday and last week for the same, pt has hx of WPW, had ablation done in 2011. There has been questioning whether it is actually syncopal episodes or seizures. Nobody has reported seizure like activity. Pt did experience LOC today, was witnessed falling, did hit her head. 114/70, HR 100, CBG 100. 12 lead unremarkable. Pt has attempted to make a follow up appt with cardiologist, however has not been able to make one in between syncopal episodes. Pt a/o x4.

## 2014-09-11 NOTE — ED Provider Notes (Signed)
CSN: [id number]     Arrival date & time 09/11/14  1229 History   First MD Initiated Contact with Patient 09/11/14 1238     Chief Complaint  Patient presents with  . Near Syncope     (Consider location/radiation/quality/duration/timing/severity/associated sxs/prior Treatment) HPI  Desiree Gutierrez is a 24 y.o. female with PMH of WPW status post ablation 2011 followed by Dr. Alfonso Ellis at [facility], vasovagal syncope, POTS presenting with current syncope. Patient was ambulating and noted lightheadedness with associated nausea and does not remember the rest. Friend is bedside who contributes to history. She states patient lost consciousness for about 5 minutes and was confused to the event as well as time, place, person by progress for about 10-15 minutes. No myoclonic jerking. No loss of control of bladder or bowel. Patient is currently uninsured and is talking with case management/social work to [employer]. She's been recommended to follow-up with neurology but hasn't due to financial issues.   Past Medical History  Diagnosis Date  . WPW (Wolff-Parkinson-White syndrome)     a. s/p ablation (2011) Dr Gevena Cotton at [facility] (posterior lateral pathway)  . Vasovagal syncope     a. positive tilt 2011 b. failed medical therapy with Florinef, Midodrine, Inderal, Celexa  . POTS (postural orthostatic tachycardia syndrome)   . Palpitations     a. s/p MDT ILR implanted by Dr Alfonso Ellis [facility])  . Headache   . Syncope     recurrent, daily   Past Surgical History  Procedure Laterality Date  . Cardiac electrophysiology study and ablation  2011    Dr. Gevena Cotton at [facility]- left posterior pathway ablation for WPW  . Tilt table study  2011    neurally mediated syncope  . Loop recorder implant  01/22/2014    MDT LINQ implanted by Dr Alfonso Ellis at [facility] for evaluation of palpitations/syncope   Family History  Problem Relation Age of Onset  . Diabetes Mother   . Hypertension Mother   . Stroke Mother   . Hypertension  Brother    History  Substance Use Topics  . Smoking status: Never Smoker   . Smokeless tobacco: Not on file  . Alcohol Use: No   OB History    No data available     Review of Systems 10 Systems reviewed and are negative for acute change except as noted in the HPI.    Allergies  Amitiza; Ketorolac; Metoclopramide; Midodrine; and Fludrocortisone  Home Medications   Prior to Admission medications   Not on File   BP 109/73 mmHg  Pulse 93  Temp(Src) 99.5 F (37.5 C) (Oral)  Resp 21  SpO2 100%  LMP 08/22/2014 Physical Exam  Constitutional: She appears well-developed and well-nourished. No distress.  HENT:  Head: Normocephalic and atraumatic.  Mouth/Throat: Oropharynx is clear and moist.  Eyes: Conjunctivae and EOM are normal. Pupils are equal, round, and reactive to light. Right eye exhibits no discharge. Left eye exhibits no discharge.  Neck: Normal range of motion. Neck supple.  No nuchal rigidity  Cardiovascular: Normal rate and regular rhythm.   Pulmonary/Chest: Effort normal and breath sounds normal. No respiratory distress. She has no wheezes.  Abdominal: Soft. Bowel sounds are normal. She exhibits no distension. There is no tenderness.  Neurological: She is alert. No cranial nerve deficit. Coordination normal.  Speech is clear and goal oriented. Peripheral visual fields intact. Strength 5/5 in upper and lower extremities. Sensation intact. Intact rapid alternating movements, finger to nose, and heel to shin. Negative Romberg. No pronator drift.  Normal gait.   Skin: Skin is warm and dry. She is not diaphoretic.  Nursing note and vitals reviewed.   ED Course  Procedures (including critical care time) Labs Review Labs Reviewed  CBC  I-STAT CHEM 8, ED  I-STAT BETA HCG BLOOD, ED ([facility], [facility], [facility] ONLY)    Imaging Review No results found.   EKG Interpretation None      MDM   Final diagnoses:  Syncope, unspecified syncope type   Patient has presented  multiple times to the emergency room for syncope. She is closely followed by cardiology for WPW. VSS. Neurological exam without focal deficits. Patient given fluids. Laboratory workup without significant abnormalities. Syncope likely related to patient's chronic conditions. Consult neurology. Spoke with Dr. Thad Ranger who stated patient needs to follow-up outpatient and no emergent testing at this time. She recommended following up with [facility] who at times take uninsured patients. Pt to call for earlier appointment with Cardiology.  Discussed return precautions with patient. Discussed all results and patient verbalizes understanding and agrees with plan.  Case has been discussed with Dr. Karma Ganja who agrees with the above plan and to discharge.    Oswaldo Conroy, PA-C 09/12/14 1610  Jerelyn Scott, MD 09/12/14 [phone]

## 2014-09-11 NOTE — Discharge Instructions (Signed)
Return to the emergency room with worsening of symptoms, new symptoms or with symptoms that are concerning, , especially severe worsening of headache, visual or speech changes, weakness in face, arms or legs. Follow up with Primary care provider. Call to make follow up appointment with your Cardiologist. Call for follow up [facility]. Number provided above. Continue working on Ambulance person. Read below information and follow recommendations. Syncope Syncope is a medical term for fainting or passing out. This means you lose consciousness and drop to the ground. People are generally unconscious for less than 5 minutes. You may have some muscle twitches for up to 15 seconds before waking up and returning to normal. Syncope occurs more often in older adults, but it can happen to anyone. While most causes of syncope are not dangerous, syncope can be a sign of a serious medical problem. It is important to seek medical care.  CAUSES  Syncope is caused by a sudden drop in blood flow to the brain. The specific cause is often not determined. Factors that can bring on syncope include:  Taking medicines that lower blood pressure.  Sudden changes in posture, such as standing up quickly.  Taking more medicine than prescribed.  Standing in one place for too long.  Seizure disorders.  Dehydration and excessive exposure to heat.  Low blood sugar (hypoglycemia).  Straining to have a bowel movement.  Heart disease, irregular heartbeat, or other circulatory problems.  Fear, emotional distress, seeing blood, or severe pain. SYMPTOMS  Right before fainting, you may:  Feel dizzy or light-headed.  Feel nauseous.  See all white or all black in your field of vision.  Have cold, clammy skin. DIAGNOSIS  Your health care provider will ask about your symptoms, perform a physical exam, and perform an electrocardiogram (ECG) to record the electrical activity of your heart. Your health  care provider may also perform other heart or blood tests to determine the cause of your syncope which may include:  Transthoracic echocardiogram (TTE). During echocardiography, sound waves are used to evaluate how blood flows through your heart.  Transesophageal echocardiogram (TEE).  Cardiac monitoring. This allows your health care provider to monitor your heart rate and rhythm in real time.  Holter monitor. This is a portable device that records your heartbeat and can help diagnose heart arrhythmias. It allows your health care provider to track your heart activity for several days, if needed.  Stress tests by exercise or by giving medicine that makes the heart beat faster. TREATMENT  In most cases, no treatment is needed. Depending on the cause of your syncope, your health care provider may recommend changing or stopping some of your medicines. HOME CARE INSTRUCTIONS  Have someone stay with you until you feel stable.  Do not drive, use machinery, or play sports until your health care provider says it is okay.  Keep all follow-up appointments as directed by your health care provider.  Lie down right away if you start feeling like you might faint. Breathe deeply and steadily. Wait until all the symptoms have passed.  Drink enough fluids to keep your urine clear or pale yellow.  If you are taking blood pressure or heart medicine, get up slowly and take several minutes to sit and then stand. This can reduce dizziness. SEEK IMMEDIATE MEDICAL CARE IF:   You have a severe headache.  You have unusual pain in the chest, abdomen, or back.  You are bleeding from your mouth or rectum, or you have black or  tarry stool.  You have an irregular or very fast heartbeat.  You have pain with breathing.  You have repeated fainting or seizure-like jerking during an episode.  You faint when sitting or lying down.  You have confusion.  You have trouble walking.  You have severe  weakness.  You have vision problems. If you fainted, call your local emergency services (911 in [place] Do not drive yourself to the hospital.  MAKE SURE YOU:  Understand these instructions.  Will watch your condition.  Will get help right away if you are not doing well or get worse. Document Released: 03/21/2005 Document Revised: 03/26/2013 Document Reviewed: 05/20/2011 [facility] Patient Information 2015 [place], [place]. This information is not intended to replace advice given to you by your health care provider. Make sure you discuss any questions you have with your health care provider.

## 2014-09-24 ENCOUNTER — Emergency Department (HOSPITAL_COMMUNITY): Payer: Self-pay

## 2014-09-24 ENCOUNTER — Encounter (HOSPITAL_COMMUNITY): Payer: Self-pay | Admitting: Emergency Medicine

## 2014-09-24 ENCOUNTER — Emergency Department (HOSPITAL_COMMUNITY)
Admission: EM | Admit: 2014-09-24 | Discharge: 2014-09-24 | Disposition: A | Discharge status: Home / Self Care | Payer: Self-pay | Attending: Emergency Medicine | Admitting: Emergency Medicine

## 2014-09-24 DIAGNOSIS — R55 Syncope and collapse: Secondary | ICD-10-CM | POA: Insufficient documentation

## 2014-09-24 DIAGNOSIS — Z3202 Encounter for pregnancy test, result negative: Secondary | ICD-10-CM | POA: Insufficient documentation

## 2014-09-24 DIAGNOSIS — M542 Cervicalgia: Secondary | ICD-10-CM | POA: Insufficient documentation

## 2014-09-24 DIAGNOSIS — Z8679 Personal history of other diseases of the circulatory system: Secondary | ICD-10-CM | POA: Insufficient documentation

## 2014-09-24 LAB — CBC
HEMATOCRIT: 34.2 % — AB (ref 36.0–46.0)
Hemoglobin: 10.9 g/dL — ABNORMAL LOW (ref 12.0–15.0)
MCH: 27.9 pg (ref 26.0–34.0)
MCHC: 31.9 g/dL (ref 30.0–36.0)
MCV: 87.5 fL (ref 78.0–100.0)
Platelets: 242 10*3/uL (ref 150–400)
RBC: 3.91 MIL/uL (ref 3.87–5.11)
RDW: 12.2 % (ref 11.5–15.5)
WBC: 5.1 10*3/uL (ref 4.0–10.5)

## 2014-09-24 LAB — CBG MONITORING, ED: Glucose-Capillary: 70 mg/dL (ref 65–99)

## 2014-09-24 LAB — BASIC METABOLIC PANEL
Anion gap: 8 (ref 5–15)
BUN: 12 mg/dL (ref 6–20)
CO2: 26 mmol/L (ref 22–32)
Calcium: 9.3 mg/dL (ref 8.9–10.3)
Chloride: 104 mmol/L (ref 101–111)
Creatinine, Ser: 0.72 mg/dL (ref 0.44–1.00)
GFR calc Af Amer: >60 mL/min (ref >60)
GFR calc non Af Amer: >60 mL/min (ref >60)
Glucose, Bld: 108 mg/dL — ABNORMAL HIGH (ref 65–99)
Potassium: 4.3 mmol/L (ref 3.5–5.1)
Sodium: 138 mmol/L (ref 135–145)

## 2014-09-24 LAB — POC URINE PREG, ED: Preg Test, Ur: NEGATIVE

## 2014-09-24 LAB — I-STAT TROPONIN, ED: Troponin i, poc: 0 ng/mL (ref 0.00–0.08)

## 2014-09-24 MED ORDER — SODIUM CHLORIDE 0.9 % IV BOLUS (SEPSIS)
1000.0000 mL | Freq: Once | INTRAVENOUS | Status: AC
  Administered 2014-09-24: 1000 mL via INTRAVENOUS

## 2014-09-24 MED ORDER — HYDROCODONE-ACETAMINOPHEN 5-325 MG PO TABS
1.0000 | ORAL_TABLET | Freq: Once | ORAL | Status: AC
  Administered 2014-09-24: 1 via ORAL
  Filled 2014-09-24: qty 1

## 2014-09-24 NOTE — Discharge Instructions (Signed)
Follow-up with [facility], and [facility].   Syncope Syncope means a person passes out (faints). The person usually wakes up in less than 5 minutes. It is important to seek medical care for syncope. HOME CARE  Have someone stay with you until you feel normal.  Do not drive, use machines, or play sports until your doctor says it is okay.  Keep all doctor visits as told.  Lie down when you feel like you might pass out. Take deep breaths. Wait until you feel normal before standing up.  Drink enough fluids to keep your pee (urine) clear or pale yellow.  If you take blood pressure or heart medicine, get up slowly. Take several minutes to sit and then stand. GET HELP RIGHT AWAY IF:   You have a severe headache.  You have pain in the chest, belly (abdomen), or back.  You are bleeding from the mouth or butt (rectum).  You have black or tarry poop (stool).  You have an irregular or very fast heartbeat.  You have pain with breathing.  You keep passing out, or you have shaking (seizures) when you pass out.  You pass out when sitting or lying down.  You feel confused.  You have trouble walking.  You have severe weakness.  You have vision problems. If you fainted, call your local emergency services (911 in [place] Do not drive yourself to the hospital. MAKE SURE YOU:   Understand these instructions.  Will watch your condition.  Will get help right away if you are not doing well or get worse. Document Released: 09/07/2007 Document Revised: 09/20/2011 Document Reviewed: 05/20/2011 [facility] Patient Information 2015 [place], [place]. This information is not intended to replace advice given to you by your health care provider. Make sure you discuss any questions you have with your health care provider.

## 2014-09-24 NOTE — ED Notes (Signed)
Pt reports that she was "just standing" when she passed out.  Denies previous dizziness/lightheadedness.  C/o headache, neck pain, and L shoulder pain.  Pain score 6/10.  Denies numbness and tingling.

## 2014-09-24 NOTE — ED Provider Notes (Signed)
CSN: [id number]     Arrival date & time 09/24/14  1449 History   First MD Initiated Contact with Patient 09/24/14 1536     Chief Complaint  Patient presents with  . Near Syncope     HPI  Patient persist evaluation after an episode of syncope. She was standing outside of the chemistry building at [facility]. She just taken a test. States she felt her heart fluttering. Syncopal episode. 5 minute recovery. Tended to by her friend who is at her side. States she fell forward. Complained of headache and neck pain. Place a cervical collar upon arrival. Complicated history including positional orthostatic tachycardic syndrome. History of WPW status post ablation. Near daily syncope.  Shortly after the patient's arrival I received a call from her cardiologist at [facility], Dr. Alfonso Ellis.  He states that he did recently witnessed one of these episodes and feels that they've are "maybe not consistent with vasovagal syncope". He is concerned that these could be seizures and states he wanted to call "to try to help ensure that she get some follow-up with a neurologist".     Past Medical History  Diagnosis Date  . WPW (Wolff-Parkinson-White syndrome)     a. s/p ablation (2011) Dr Gevena Cotton at [facility] (posterior lateral pathway)  . Vasovagal syncope     a. positive tilt 2011 b. failed medical therapy with Florinef, Midodrine, Inderal, Celexa  . POTS (postural orthostatic tachycardia syndrome)   . Palpitations     a. s/p MDT ILR implanted by Dr Alfonso Ellis [facility])  . Headache   . Syncope     recurrent, daily   Past Surgical History  Procedure Laterality Date  . Cardiac electrophysiology study and ablation  2011    Dr. Gevena Cotton at [facility]- left posterior pathway ablation for WPW  . Tilt table study  2011    neurally mediated syncope  . Loop recorder implant  01/22/2014    MDT LINQ implanted by Dr Alfonso Ellis at [facility] for evaluation of palpitations/syncope   Family History  Problem Relation Age  of Onset  . Diabetes Mother   . Hypertension Mother   . Stroke Mother   . Hypertension Brother    History  Substance Use Topics  . Smoking status: Never Smoker   . Smokeless tobacco: Not on file  . Alcohol Use: No   OB History    No data available     Review of Systems  Constitutional: Negative for fever, chills, diaphoresis, appetite change and fatigue.  HENT: Negative for mouth sores, sore throat and trouble swallowing.   Eyes: Negative for visual disturbance.  Respiratory: Negative for cough, chest tightness, shortness of breath and wheezing.   Cardiovascular: Negative for chest pain.  Gastrointestinal: Negative for nausea, vomiting, abdominal pain, diarrhea and abdominal distention.  Endocrine: Negative for polydipsia, polyphagia and polyuria.  Genitourinary: Negative for dysuria, frequency and hematuria.  Musculoskeletal: Positive for neck pain. Negative for gait problem.  Skin: Negative for color change, pallor and rash.  Neurological: Positive for syncope. Negative for dizziness, light-headedness and headaches.  Hematological: Does not bruise/bleed easily.  Psychiatric/Behavioral: Negative for behavioral problems and confusion.      Allergies  Amitiza; Ketorolac; Metoclopramide; Midodrine; and Fludrocortisone  Home Medications   Prior to Admission medications   Not on File   BP 120/73 mmHg  Pulse 90  Temp(Src) 98.1 F (36.7 C) (Oral)  Resp 11  Ht  (1.6 m)  Wt 132 lb (59.875 kg)  BMI 23.39 kg/m2  SpO2 100%  LMP 09/19/2014 Physical Exam  Constitutional: She is oriented to person, place, and time. She appears well-developed and well-nourished. No distress.  HENT:  Head: Normocephalic.    Eyes: Conjunctivae are normal. Pupils are equal, round, and reactive to light. No scleral icterus.  Neck: No thyromegaly present.    Cardiovascular: Normal rate and regular rhythm.  Exam reveals no gallop and no friction rub.   No murmur heard. Pulmonary/Chest:  Effort normal and breath sounds normal. No respiratory distress. She has no wheezes. She has no rales.  Abdominal: Soft. Bowel sounds are normal. She exhibits no distension. There is no tenderness. There is no rebound.  Musculoskeletal: Normal range of motion.  Neurological: She is alert and oriented to person, place, and time.  Skin: Skin is warm and dry. No rash noted.  Psychiatric: She has a normal mood and affect. Her behavior is normal.    ED Course  Procedures (including critical care time) Labs Review Labs Reviewed  CBC - Abnormal; Notable for the following:    Hemoglobin 10.9 (*)    HCT 34.2 (*)    All other components within normal limits  BASIC METABOLIC PANEL - Abnormal; Notable for the following:    Glucose, Bld 108 (*)    All other components within normal limits  POC URINE PREG, ED  CBG MONITORING, ED  Rosezena Sensor, ED    Imaging Review Dg Cervical Spine Complete  09/24/2014   CLINICAL DATA:  Near syncope.  Fall today.  Neck pain  EXAM: CERVICAL SPINE  4+ VIEWS  COMPARISON:  09/07/2014  FINDINGS: There is no evidence of cervical spine fracture or prevertebral soft tissue swelling. Alignment is normal. No other significant bone abnormalities are identified.  IMPRESSION: Negative cervical spine radiographs.   Electronically Signed   By: Marlan Palau M.D.   On: 09/24/2014 16:41     EKG Interpretation   Date/Time:  Wednesday September 24 2014 15:00:06 EDT Ventricular Rate:  95 PR Interval:  123 QRS Duration: 73 QT Interval:  346 QTC Calculation: 435 R Axis:   74 Text Interpretation:  Sinus rhythm Confirmed by Fayrene Fearing  MD, Margurete Guaman ([id number]) on  09/24/2014 3:55:56 PM      MDM   Final diagnoses:  Syncope, unspecified syncope type    Evaluation here. I had our case manager visit with her. She is enrolled at [facility]. Given information for [facility]. Call for outpatient appointments. Recheck with recurrence.    Rolland Porter, MD 09/24/14  [phone]

## 2014-09-24 NOTE — Progress Notes (Addendum)
pcp is [facility] [address] [place] [address] [phone] Pt has an orange card she recently obtained Pt is a Consulting civil engineer at [employer] Pt lives with her mother who has had a stroke Pt takes care of mother Pt has her professor of chemistry and two other student/friends at bedside.   Pt has episode of loss of consciousness after taking a test today Dr Fayrene Fearing spoke with CM about assistance needed to find a neurologist for f/u Pt is seen by [facility] specialist but is a self pay [place] resident Cm recommends f/u with [facility] and check with [facility] staff about neurology available slots Pt agreed to be contacted on 09/25/14 with result of consults with [facility] and [facility]  CM sent emails to [facility] CMs, Erskine Squibb and Shanda Bumps and [facility] staff Stacy Pt states she has worked with [facility] ([facility]) and been informed by the 555 discount through the billing office CM encouraged pt to keep this option open 1749 CM sent emails to [facility] CMs x 2 and [facility]

## 2014-09-24 NOTE — ED Notes (Addendum)
Per EMS pt syncope from standing position to concrete. No bleeding or external injury. Pt complaint of left side headache, neck pain, and arm pain. LOC. C collar in place. Pt alert and oriented x4. Hx of WPW and POTS.

## 2014-09-24 NOTE — ED Notes (Signed)
Graham crackers and orange juice given

## 2014-09-25 ENCOUNTER — Telehealth: Payer: Self-pay

## 2014-09-25 NOTE — Telephone Encounter (Signed)
[facility] Post-discharge Follow-Up Phone Call:  Last ED visit: 09/25/14     Patient has had 14 ED visits and 2 admissions in the last year Principal Discharge Diagnosis(es): Syncope    Hx of Wolf-Parkinson-White Syndrome-s/p ablation, Postural orthostatic tachycardia syndrome Call Completed: Yes                      With Whom: Patient     Please check all that apply:  X  Patient is knowledgeable of his/her condition(s) and/or treatment. X  Patient is caring for self at home.  ? Patient is receiving assist at home from family and/or caregiver. Family and/or caregiver is knowledgeable of patient's condition(s) and/or treatment. ? Patient is receiving home health services. If so, name of agency.     Medication Reconciliation:  X  Medication list reviewed with patient. Patient not on any daily medications.    Activities of Daily Living:  X  Independent ? Needs assist (describe; ? home DME used) ? Total Care (describe, ? home DME used)   Community resources in place for patient:  X  None  ? Home Health/Home DME ? Assisted Living ? Support Group          Questions/Topics Discussed:  Call placed to patient and discussed [facility] and the follow-up and medical management the clinic provides.  Patient agreeable to follow-up at the [facility].  She indicates she lives with her mother in a private residence. Patient is uninsured and does not have a PCP.  She will need to establish care with [facility] after [facility] follow-up. Appointment obtained on 10/03/14 at 1030 with Dr. Venetia Night. Patient appreciative of appointment.  ED provider recommending patient have Neurology follow-up. Dr. Venetia Night to address at appointment.

## 2014-09-29 ENCOUNTER — Encounter (HOSPITAL_COMMUNITY): Payer: Self-pay | Admitting: Emergency Medicine

## 2014-09-29 ENCOUNTER — Emergency Department (HOSPITAL_COMMUNITY)
Admission: EM | Admit: 2014-09-29 | Discharge: 2014-09-29 | Disposition: A | Discharge status: Home / Self Care | Payer: Medicaid Other | Attending: Emergency Medicine | Admitting: Emergency Medicine

## 2014-09-29 DIAGNOSIS — R42 Dizziness and giddiness: Secondary | ICD-10-CM | POA: Diagnosis not present

## 2014-09-29 DIAGNOSIS — Y92002 Bathroom of unspecified non-institutional (private) residence single-family (private) house as the place of occurrence of the external cause: Secondary | ICD-10-CM | POA: Insufficient documentation

## 2014-09-29 DIAGNOSIS — Y998 Other external cause status: Secondary | ICD-10-CM | POA: Diagnosis not present

## 2014-09-29 DIAGNOSIS — R55 Syncope and collapse: Secondary | ICD-10-CM | POA: Insufficient documentation

## 2014-09-29 DIAGNOSIS — Y9389 Activity, other specified: Secondary | ICD-10-CM | POA: Diagnosis not present

## 2014-09-29 DIAGNOSIS — Z8679 Personal history of other diseases of the circulatory system: Secondary | ICD-10-CM | POA: Diagnosis not present

## 2014-09-29 DIAGNOSIS — W1839XA Other fall on same level, initial encounter: Secondary | ICD-10-CM | POA: Insufficient documentation

## 2014-09-29 DIAGNOSIS — S299XXA Unspecified injury of thorax, initial encounter: Secondary | ICD-10-CM | POA: Diagnosis not present

## 2014-09-29 DIAGNOSIS — R531 Weakness: Secondary | ICD-10-CM | POA: Diagnosis not present

## 2014-09-29 LAB — CBC WITH DIFFERENTIAL/PLATELET
BASOS ABS: 0 10*3/uL (ref 0.0–0.1)
Basophils Relative: 0 % (ref 0–1)
EOS PCT: 1 % (ref 0–5)
Eosinophils Absolute: 0.1 10*3/uL (ref 0.0–0.7)
HCT: 36.3 % (ref 36.0–46.0)
Hemoglobin: 11.7 g/dL — ABNORMAL LOW (ref 12.0–15.0)
Lymphocytes Relative: 39 % (ref 12–46)
Lymphs Abs: 3.2 10*3/uL (ref 0.7–4.0)
MCH: 28.1 pg (ref 26.0–34.0)
MCHC: 32.2 g/dL (ref 30.0–36.0)
MCV: 87.1 fL (ref 78.0–100.0)
Monocytes Absolute: 0.6 10*3/uL (ref 0.1–1.0)
Monocytes Relative: 8 % (ref 3–12)
NEUTROS PCT: 52 % (ref 43–77)
Neutro Abs: 4.3 10*3/uL (ref 1.7–7.7)
Platelets: 226 10*3/uL (ref 150–400)
RBC: 4.17 MIL/uL (ref 3.87–5.11)
RDW: 12.1 % (ref 11.5–15.5)
WBC: 8.2 10*3/uL (ref 4.0–10.5)

## 2014-09-29 LAB — BASIC METABOLIC PANEL
Anion gap: 6 (ref 5–15)
BUN: 10 mg/dL (ref 6–20)
CALCIUM: 9.5 mg/dL (ref 8.9–10.3)
CO2: 27 mmol/L (ref 22–32)
CREATININE: 1.05 mg/dL — AB (ref 0.44–1.00)
Chloride: 107 mmol/L (ref 101–111)
GFR calc non Af Amer: >60 mL/min (ref >60)
Glucose, Bld: 77 mg/dL (ref 65–99)
Potassium: 3.8 mmol/L (ref 3.5–5.1)
Sodium: 140 mmol/L (ref 135–145)

## 2014-09-29 LAB — I-STAT TROPONIN, ED: Troponin i, poc: 0 ng/mL (ref 0.00–0.08)

## 2014-09-29 LAB — CBG MONITORING, ED: GLUCOSE-CAPILLARY: 73 mg/dL (ref 65–99)

## 2014-09-29 MED ORDER — HYDROCODONE-ACETAMINOPHEN 5-325 MG PO TABS
2.0000 | ORAL_TABLET | Freq: Once | ORAL | Status: AC
  Administered 2014-09-29: 2 via ORAL
  Filled 2014-09-29: qty 2

## 2014-09-29 MED ORDER — ONDANSETRON 4 MG PO TBDP
4.0000 mg | ORAL_TABLET | Freq: Once | ORAL | Status: AC
  Administered 2014-09-29: 4 mg via ORAL
  Filled 2014-09-29: qty 1

## 2014-09-29 MED ORDER — SODIUM CHLORIDE 0.9 % IV BOLUS (SEPSIS): 1000.0000 mL | Freq: Once | INTRAVENOUS | Status: DC

## 2014-09-29 MED ORDER — ASPIRIN 81 MG PO CHEW
324.0000 mg | CHEWABLE_TABLET | Freq: Once | ORAL | Status: AC
  Administered 2014-09-29: 324 mg via ORAL
  Filled 2014-09-29: qty 4

## 2014-09-29 NOTE — ED Notes (Signed)
Pt ambulated without assistance, stated she felt dizzy.

## 2014-09-29 NOTE — ED Notes (Signed)
Pt c/o Chest pain. Lorelle FormosaHanna PA notified.

## 2014-09-29 NOTE — ED Provider Notes (Signed)
CSN: [id number]     Arrival date & time 09/29/14  1737 History   None    Chief Complaint  Patient presents with  . Loss of Consciousness     (Consider location/radiation/quality/duration/timing/severity/associated sxs/prior Treatment) Patient is a 24 y.o. female presenting with syncope. The history is provided by the patient. No language interpreter was used.  Loss of Consciousness Associated symptoms: dizziness and weakness   Desiree Gutierrez is a 24 year old female with a history of WPW and ablation, by mouth 3 times a day, recurrent syncopes who presents for syncopal episode that occurred at 4:30 today while at [employer]. This episode was witnessed by 2 friends who state that she passed out and hit her head on the ground, she was unconscious for 2-3 minutes before waking up again. She denies any aura prior to the event but states she feels weak now. She is also complaining of dizziness but cannot tell me if it's room spinning or feeling off balance. She states this happens to her often with the latest episode on 09/24/2014. She was evaluated in the ED by Dr. Rolland PorterMark James. He received a call from her cardiologist Dr. Alfonso EllisBeaty who stated that he thought these episodes were not consistent with vasovagal syncope and that she would need further workup by a neurologist for possible seizures. Patient is vague about her history in the last couple of weeks. She states her cardiologist Dr. Alfonso EllisBeaty called her on Friday and told her that he was referring her to go for neurology. She states she does not have an appointment and does not know what is going on.  Past Medical History  Diagnosis Date  . WPW (Wolff-Parkinson-White syndrome)     a. s/p ablation (2011) [facility] (posterior lateral pathway)  . Vasovagal syncope     a. positive tilt 2011 b. failed medical therapy with Florinef, Midodrine, Inderal, Celexa  . POTS (postural orthostatic tachycardia syndrome)   . Palpitations     a. s/p MDT ILR implanted  by Dr Alfonso EllisBeaty [facility]  . Headache   . Syncope     recurrent, daily   Past Surgical History  Procedure Laterality Date  . Cardiac electrophysiology study and ablation  2011    [facility]- left posterior pathway ablation for WPW  . Tilt table study  2011    neurally mediated syncope  . Loop recorder implant  01/22/2014    MDT LIN[facility] of [facility]ations/syncope   Family History  Problem Relation Age of Onset  . Diabetes Mother   . Hypertension Mother   . Stroke Mother   . Hypertension Brother    History  Substance Use Topics  . Smoking status: Never Smoker   . Smokeless tobacco: Not on file  . Alcohol Use: No   OB History    No data available     Review of Systems  Cardiovascular: Positive for syncope.  Neurological: Positive for dizziness, syncope and weakness. Negative for light-headedness.  All other systems reviewed and are negative.     Allergies  Amitiza; Ketorolac; Metoclopramide; Midodrine; and Fludrocortisone  Home Medications   Prior to Admission medications   Medication Sig Start Date End Date Taking? Authorizing Provider  naproxen sodium (ANAPROX) 220 MG tablet Take 220 mg by mouth 2 (two) times daily as needed (for pain).   Yes Historical Provider, MD   BP 100/66 mmHg  Pulse 90  Temp(Src) 98.3 F (36.8 C) (Oral)  Resp  20  SpO2 100%  LMP 09/19/2014 Physical Exam  Constitutional: She is oriented to person, place, and time. She appears well-developed and well-nourished.  HENT:  Head: Normocephalic. Head is without raccoon's eyes, without Battle's sign, without abrasion, without contusion, without laceration, without right periorbital erythema and without left periorbital erythema. Hair is normal.  Nose: Nose normal.  Mouth/Throat: Normal dentition.  Eyes: Conjunctivae are normal.  Neck: Normal range of motion. Neck supple.  Cardiovascular: Normal rate, regular rhythm and normal heart sounds.    Pulmonary/Chest: Effort normal and breath sounds normal.  Abdominal: Soft. There is no tenderness.  Musculoskeletal: Normal range of motion.  Neurological: She is alert and oriented to person, place, and time. She has normal strength. No sensory deficit. GCS eye subscore is 4. GCS verbal subscore is 5. GCS motor subscore is 6.  Cranial nerves III through XII intact.  Skin: Skin is warm and dry.  Psychiatric: She has a normal mood and affect. Her behavior is normal.  Nursing note and vitals reviewed.   ED Course  Procedures (including critical care time) Labs Review Labs Reviewed  CBC WITH DIFFERENTIAL/PLATELET - Abnormal; Notable for the following:    Hemoglobin 11.7 (*)    All other components within normal limits  BASIC METABOLIC PANEL - Abnormal; Notable for the following:    Creatinine, Ser 1.05 (*)    All other components within normal limits  CBG MONITORING, ED  I-STAT TROPOININ, ED    Imaging Review No results found.   EKG Interpretation   Date/Time:  Monday September 29 2014 18:30:23 EDT Ventricular Rate:  86 PR Interval:  123 QRS Duration: 77 QT Interval:  350 QTC Calculation: 419 R Axis:   88 Text Interpretation:  Normal sinus rhythm no change since September 24 2014  Confirmed by Criss Alvine  MD, SCOTT [phone]) on 09/29/2014 7:13:26 PM      MDM   Final diagnoses:  Syncope, unspecified syncope type  Patient had a implanted [employer] cardiac monitor loop recorder placed in October 2015 for Wolff-Parkinson-White syndrome. She is followed by her cardiologist, Dr. Donnetta Simpers. Labs are not concerning. She is mildly anemic. EKG does not show WPW at this time. Patient is in no acute distress and resting comfortably. Dr. Petra Kuba called Dr. Criss Alvine and stated that the patient would need neurology follow up and wanted to see if we could get her in sooner. I spoke to Desiree Gutierrez from case management who has seen the patient and was able to get the patient an appointment on Friday with  neurology. Recheck: Patient complaining of chest pain. She was given aspirin. A troponin was ordered and was negative. I spoke to Desiree Gutierrez from [employer] who stated that there had been no recordings of any events that have occurred in the last 2 weeks from the loop recorder.  I thoroughly discussed the findings for over 20 minutes with the patient and friends, as well as her teacher at bedside.  I answered all of their questions.    Desiree Gosselin, Desiree Gutierrez 09/30/14 9604  Desiree Loveless, MD 09/30/14 [phone]

## 2014-09-29 NOTE — ED Notes (Signed)
Per [facility], Pt hx of POTS, pt was at [employer], just finished eating, went to the bathroom, pt passed out and fell forward. Pt states left side fo face is a little tender and feeling some generalized weakness. Denies neck or back pain. EKG unremarkable. AAOX4, in NAD.

## 2014-09-29 NOTE — Discharge Instructions (Signed)
Syncope Keep your follow up appointment on 10/03/14. Return for lightheadedness, chest pain, or syncope. Take tylenol or motrin for your headache.  Syncope means a person passes out (faints). The person usually wakes up in less than 5 minutes. It is important to seek medical care for syncope. HOME CARE  Have someone stay with you until you feel normal.  Do not drive, use machines, or play sports until your doctor says it is okay.  Keep all doctor visits as told.  Lie down when you feel like you might pass out. Take deep breaths. Wait until you feel normal before standing up.  Drink enough fluids to keep your pee (urine) clear or pale yellow.  If you take blood pressure or heart medicine, get up slowly. Take several minutes to sit and then stand. GET HELP RIGHT AWAY IF:   You have a severe headache.  You have pain in the chest, belly (abdomen), or back.  You are bleeding from the mouth or butt (rectum).  You have black or tarry poop (stool).  You have an irregular or very fast heartbeat.  You have pain with breathing.  You keep passing out, or you have shaking (seizures) when you pass out.  You pass out when sitting or lying down.  You feel confused.  You have trouble walking.  You have severe weakness.  You have vision problems. If you fainted, call your local emergency services (911 in [place] Do not drive yourself to the hospital. MAKE SURE YOU:   Understand these instructions.  Will watch your condition.  Will get help right away if you are not doing well or get worse. Document Released: 09/07/2007 Document Revised: 09/20/2011 Document Reviewed: 05/20/2011 [facility] Patient Information 2015 [place], [place]. This information is not intended to replace advice given to you by your health care provider. Make sure you discuss any questions you have with your health care provider.

## 2014-09-29 NOTE — Care Management (Signed)
ED CM consulted concerning referral for sooner Neurology appt. Met with patient to discuss follow up with neurology. Patient states, she does not have insurance and cannot afford  Neurology appt. Patient has appointment with [facility] on Fri July1st, sent message to PCP patient needs a referral for Neurology. Explained the process to  patient about referral from PCP, patient verbalized understanding. CM updated H. Patel-Mills on referral from PCP. No further ED CM needs identified.

## 2014-10-02 ENCOUNTER — Emergency Department (HOSPITAL_COMMUNITY)
Admission: EM | Admit: 2014-10-02 | Discharge: 2014-10-02 | Disposition: A | Discharge status: Home / Self Care | Payer: Medicaid Other | Attending: Emergency Medicine | Admitting: Emergency Medicine

## 2014-10-02 ENCOUNTER — Emergency Department (HOSPITAL_COMMUNITY): Payer: Medicaid Other

## 2014-10-02 ENCOUNTER — Encounter (HOSPITAL_COMMUNITY): Payer: Self-pay

## 2014-10-02 ENCOUNTER — Other Ambulatory Visit: Payer: Self-pay

## 2014-10-02 ENCOUNTER — Telehealth: Payer: Self-pay

## 2014-10-02 ENCOUNTER — Telehealth: Payer: Self-pay | Admitting: Family Medicine

## 2014-10-02 DIAGNOSIS — Z3202 Encounter for pregnancy test, result negative: Secondary | ICD-10-CM | POA: Insufficient documentation

## 2014-10-02 DIAGNOSIS — R0602 Shortness of breath: Secondary | ICD-10-CM | POA: Diagnosis not present

## 2014-10-02 DIAGNOSIS — I456 Pre-excitation syndrome: Secondary | ICD-10-CM | POA: Insufficient documentation

## 2014-10-02 DIAGNOSIS — R0789 Other chest pain: Secondary | ICD-10-CM | POA: Insufficient documentation

## 2014-10-02 DIAGNOSIS — R55 Syncope and collapse: Secondary | ICD-10-CM | POA: Diagnosis not present

## 2014-10-02 DIAGNOSIS — R42 Dizziness and giddiness: Secondary | ICD-10-CM | POA: Diagnosis not present

## 2014-10-02 DIAGNOSIS — R531 Weakness: Secondary | ICD-10-CM | POA: Diagnosis not present

## 2014-10-02 DIAGNOSIS — R079 Chest pain, unspecified: Secondary | ICD-10-CM | POA: Diagnosis present

## 2014-10-02 LAB — BASIC METABOLIC PANEL
Anion gap: 6 (ref 5–15)
BUN: 9 mg/dL (ref 6–20)
CALCIUM: 9.1 mg/dL (ref 8.9–10.3)
CO2: 26 mmol/L (ref 22–32)
Chloride: 107 mmol/L (ref 101–111)
Creatinine, Ser: 0.77 mg/dL (ref 0.44–1.00)
GFR calc Af Amer: >60 mL/min (ref >60)
GFR calc non Af Amer: >60 mL/min (ref >60)
Glucose, Bld: 100 mg/dL — ABNORMAL HIGH (ref 65–99)
Potassium: 4.1 mmol/L (ref 3.5–5.1)
Sodium: 139 mmol/L (ref 135–145)

## 2014-10-02 LAB — CBC
HEMATOCRIT: 32.4 % — AB (ref 36.0–46.0)
Hemoglobin: 10.4 g/dL — ABNORMAL LOW (ref 12.0–15.0)
MCH: 27.4 pg (ref 26.0–34.0)
MCHC: 32.1 g/dL (ref 30.0–36.0)
MCV: 85.5 fL (ref 78.0–100.0)
PLATELETS: 242 10*3/uL (ref 150–400)
RBC: 3.79 MIL/uL — ABNORMAL LOW (ref 3.87–5.11)
RDW: 12 % (ref 11.5–15.5)
WBC: 5.6 10*3/uL (ref 4.0–10.5)

## 2014-10-02 LAB — I-STAT BETA HCG BLOOD, ED (MC, WL, AP ONLY): I-stat hCG, quantitative: <5 m[IU]/mL (ref <5)

## 2014-10-02 LAB — I-STAT TROPONIN, ED: TROPONIN I, POC: 0.01 ng/mL (ref 0.00–0.08)

## 2014-10-02 NOTE — ED Notes (Signed)
Pt still ok for discharge per Mayme GentaBen Cartner, PA

## 2014-10-02 NOTE — Telephone Encounter (Signed)
Call placed to patient to remind her of appointment on 10/03/14 at 1030 with [facility]. Patient aware of appointment and indicates she will be at appointment. She indicates she has a ride to appointment. Patient had loss of consciousness on 09/29/14 and went to ED. Neurology appointment scheduled on 10/13/14 at 1400 with Dr. Lucia [facility]hern [facility]. Patient also aware of appointment. Patient inquired about how to sign up for [facility]. Provided information about obtaining a Medical sales representativeinancial Counselor appointment and Financial Counselor walk-in hours at [facility]. Patient verbalized understanding.

## 2014-10-02 NOTE — ED Provider Notes (Signed)
CSN: [id number]     Arrival date & time 10/02/14  1548 History   First MD Initiated Contact with Patient 10/02/14 1605     Chief Complaint  Patient presents with  . Chest Pain  . Near Syncope     (Consider location/radiation/quality/duration/timing/severity/associated sxs/prior Treatment) HPI Desiree Gutierrez is a 24 y.o. female with a history of WPW, POTS, and recurrent single events comes in for evaluation of chest discomfort and near sig OB. Patient is well-known to this emergency department and has been seen in the ED for this complaint multiple times. Patient reports earlier today she began to feel weak, sweaty, short of breath and dizzy which is typical for her before she has a syncopal event. She reports sitting down when she felt this way and was able to avoid "passing out". She reports feeling better after having sat down. She reports associated chest tightness that has resolved. She reports calling her cardiologist, Dr. Alfonso EllisBeaty and reporting her symptoms and she reports being told to come to the ED for further evaluation. She reports having a follow-up appointment with neurology on July 11. She denies any new symptoms and reports that her symptoms today are baseline for her.  Past Medical History  Diagnosis Date  . WPW (Wolff-Parkinson-White syndrome)     a. s/p ablation (2011) [facility]n [facility] (posterior lateral pathway)  . Vasovagal syncope     a. positive tilt 2011 b. failed medical therapy with Florinef, Midodrine, Inderal, Celexa  . POTS (postural orthostatic tachycardia syndrome)   . Palpitations     a. s/p MDT ILR implanted by Dr Alfonso EllisBeaty [facility]  . Headache   . Syncope     recurrent, daily   Past Surgical History  Procedure Laterality Date  . Cardiac electrophysiology study and ablation  2011    [facility]- left posterior pathway ablation for WPW  . Tilt table study  2011    neurally mediated syncope  . Loop recorder implant  01/22/2014    MDT LINQ [facility] for evaluation of palpitations/syncope   Family History  Problem Relation Age of Onset  . Diabetes Mother   . Hypertension Mother   . Stroke Mother   . Hypertension Brother    History  Substance Use Topics  . Smoking status: Never Smoker   . Smokeless tobacco: Not on file  . Alcohol Use: No   OB History    No data available     Review of Systems  A 10 point review of systems was completed and was negative except for pertinent positives and negatives as mentioned in the history of present illness    Allergies  Amitiza; Ketorolac; Metoclopramide; Midodrine; and Fludrocortisone  Home Medications   Prior to Admission medications   Medication Sig Start Date End Date Taking? Authorizing Provider  naproxen sodium (ANAPROX) 220 MG tablet Take 220 mg by mouth 2 (two) times daily as needed (for pain).   Yes Historical Provider, MD   BP 107/68 mmHg  Pulse 92  Temp(Src) 98.8 F (37.1 C)  Resp 14  SpO2 100%  LMP 09/19/2014 Physical Exam  Constitutional: She is oriented to person, place, and time. She appears well-developed and well-nourished.  HENT:  Head: Normocephalic and atraumatic.  Mouth/Throat: Oropharynx is clear and moist.  Eyes: Conjunctivae are normal. Pupils are equal, round, and reactive to light. Right eye exhibits no discharge. Left eye exhibits no discharge. No scleral icterus.  Neck: Neck supple.  Cardiovascular:  Normal rate, regular rhythm and normal heart sounds.   Pulmonary/Chest: Effort normal and breath sounds normal. No respiratory distress. She has no wheezes. She has no rales.  Abdominal: Soft. There is no tenderness.  Musculoskeletal: She exhibits no tenderness.  Neurological: She is alert and oriented to person, place, and time.  Cranial Nerves II-XII grossly intact  Skin: Skin is warm and dry. No rash noted.  Psychiatric: She has a normal mood and affect.  Nursing note and vitals reviewed.   ED Course  Procedures  (including critical care time) Labs Review Labs Reviewed  CBC - Abnormal; Notable for the following:    RBC 3.79 (*)    Hemoglobin 10.4 (*)    HCT 32.4 (*)    All other components within normal limits  BASIC METABOLIC PANEL - Abnormal; Notable for the following:    Glucose, Bld 100 (*)    All other components within normal limits  I-STAT TROPOININ, ED  I-STAT BETA HCG BLOOD, ED ([facility], [facility], [facility] ONLY)    Imaging Review Dg Chest 2 View  10/02/2014   CLINICAL DATA:  Chest discomfort, shortness of breath for 2 hours  EXAM: CHEST  2 VIEW  COMPARISON:  09/03/2014  FINDINGS: The heart size and mediastinal contours are within normal limits. Both lungs are clear. The visualized skeletal structures are unremarkable.  IMPRESSION: No active cardiopulmonary disease.   Electronically Signed   By: Natasha Mead M.D.   On: 10/02/2014 16:50     EKG Interpretation   Date/Time:  Thursday October 02 2014 16:17:26 EDT Ventricular Rate:  87 PR Interval:  118 QRS Duration: 75 QT Interval:  346 QTC Calculation: 416 R Axis:   72 Text Interpretation:  Sinus rhythm Borderline short PR interval since last  tracing no significant change Confirmed by Effie Shy  MD, ELLIOTT [phone]) on  10/02/2014 4:27:05 PM     Meds given in ED:  Medications - No data to display  New Prescriptions   No medications on file   Filed Vitals:   10/02/14 1600 10/02/14 1630 10/02/14 1730 10/02/14 1830  BP: 102/56 103/59 114/70 107/68  Pulse: 97 94 88 92  Temp: 98.8 F (37.1 C)     Resp: SpO2: 98% 100% 100% 100%     MDM  Vitals stable - WNL -afebrile Pt resting comfortably in ED. PE--physical exam is not concerning. Labwork--labs are baseline and noncontributory. EKG is reassuring and unchanged from prior. Imaging--chest x-ray shows no acute cardio point pathology. I personally reviewed the imaging and agree with the results as interpreted by the radiologist.  DDX--patient's evaluation today essentially unchanged  from previous ED visits. Patient remains at baseline. She is stable for discharge to follow-up with her PCP and outpatient neurology as needed.  I discussed all relevant lab findings and imaging results with pt and they verbalized understanding. Discussed f/u with PCP within 48 hrs and return precautions, pt very amenable to plan. Prior to patient discharge, I discussed and reviewed this case with Dr. Effie Shy Final diagnoses:  Near syncope  Chest discomfort      Joycie Peek, PA-C 10/02/14 1834  Mancel Bale, MD 10/02/14 [phone]

## 2014-10-02 NOTE — Discharge Instructions (Signed)
You were evaluated in the ED and there does not appear to be any emergent cause for your symptoms at this time. Your exam, labs, EKG and chest x-ray were all reassuring. It is important to follow-up with your PCP/cardiologist/neurologist for further evaluation and management of your symptoms. Return to ED for worsening symptoms.

## 2014-10-02 NOTE — ED Notes (Signed)
Pt verbalizes understanding of d/c instructions and denies any further need at this time. 

## 2014-10-02 NOTE — ED Notes (Signed)
PER EMS: pt from home with reports of CP, SOB and near syncopal episode. She states she has [employer] Chemicals[employer]ff Parkinson White Syndrome and Postural Orthostatic Tachycardic Syndrome (POTS) and has episodes of dizziness, diaphoresis, chest pain, and weakness. Was seen here last week with the episode and she lost consciousness and fell down stairs. Today however, she is just experiencing CP, SOB but she did not pass out . She felt very dizzy and had near syncopal episode. A&Ox4. BP- 100/64, HR-100, CBG 92, RR-18.

## 2014-10-02 NOTE — ED Notes (Signed)
Pt got really hot around 1500 today and felt like she was going to faint, was able to sit down and before actually fainting and hurting herself.  Pt still c/o central chest pain.

## 2014-10-02 NOTE — Telephone Encounter (Signed)
Called patient. Patient verified name and date of birth. Patient reminded of appointment for tomorrow at 10:30am and asked if she would be able to make it. Patient confirmed her appointment for tomorrow and stated that she would be able to make it. Patient asked if she had transportation in order to make it and patient stated that she does.

## 2014-10-02 NOTE — ED Notes (Signed)
Pt had syncopal episode at discharge when transferring from bed to wheelchair.  She was caught by staff and assisted back into bed, put back on monitor and Mayme GentaBen Cartner, [place] notified.  Vital signs normal, pt is conscious but no recollection of syncopal episode.  No known injury resulting from syncopal episode.

## 2014-10-03 ENCOUNTER — Other Ambulatory Visit: Payer: Self-pay | Admitting: Family Medicine

## 2014-10-03 ENCOUNTER — Encounter: Payer: Self-pay | Admitting: Family Medicine

## 2014-10-03 ENCOUNTER — Ambulatory Visit: Payer: Medicaid Other | Attending: Family Medicine | Admitting: Family Medicine

## 2014-10-03 ENCOUNTER — Other Ambulatory Visit: Payer: Self-pay

## 2014-10-03 VITALS — BP 95/66 | HR 90 | Temp 98.3°F | Resp 18 | Ht 63.0 in | Wt 134.2 lb

## 2014-10-03 DIAGNOSIS — R55 Syncope and collapse: Secondary | ICD-10-CM | POA: Diagnosis not present

## 2014-10-03 DIAGNOSIS — G90A Postural orthostatic tachycardia syndrome (POTS): Secondary | ICD-10-CM

## 2014-10-03 DIAGNOSIS — I456 Pre-excitation syndrome: Secondary | ICD-10-CM | POA: Diagnosis not present

## 2014-10-03 DIAGNOSIS — R61 Generalized hyperhidrosis: Secondary | ICD-10-CM | POA: Diagnosis not present

## 2014-10-03 DIAGNOSIS — R079 Chest pain, unspecified: Secondary | ICD-10-CM | POA: Insufficient documentation

## 2014-10-03 DIAGNOSIS — I951 Orthostatic hypotension: Secondary | ICD-10-CM

## 2014-10-03 DIAGNOSIS — R000 Tachycardia, unspecified: Secondary | ICD-10-CM

## 2014-10-03 DIAGNOSIS — R072 Precordial pain: Secondary | ICD-10-CM

## 2014-10-03 LAB — CBC WITH DIFFERENTIAL/PLATELET
BASOS PCT: 0 % (ref 0–1)
Basophils Absolute: 0 10*3/uL (ref 0.0–0.1)
Eosinophils Absolute: 0.1 10*3/uL (ref 0.0–0.7)
Eosinophils Relative: 2 % (ref 0–5)
HEMATOCRIT: 36.1 % (ref 36.0–46.0)
Hemoglobin: 11.8 g/dL — ABNORMAL LOW (ref 12.0–15.0)
LYMPHS ABS: 2.1 10*3/uL (ref 0.7–4.0)
LYMPHS PCT: 45 % (ref 12–46)
MCH: 27.9 pg (ref 26.0–34.0)
MCHC: 32.7 g/dL (ref 30.0–36.0)
MCV: 85.3 fL (ref 78.0–100.0)
MONOS PCT: 6 % (ref 3–12)
MPV: 9 fL (ref 8.6–12.4)
Monocytes Absolute: 0.3 10*3/uL (ref 0.1–1.0)
Neutro Abs: 2.2 10*3/uL (ref 1.7–7.7)
Neutrophils Relative %: 47 % (ref 43–77)
Platelets: 245 10*3/uL (ref 150–400)
RBC: 4.23 MIL/uL (ref 3.87–5.11)
RDW: 13.2 % (ref 11.5–15.5)
WBC: 4.7 10*3/uL (ref 4.0–10.5)

## 2014-10-03 LAB — T4, FREE: FREE T4: 1.02 ng/dL (ref 0.80–1.80)

## 2014-10-03 LAB — TSH: TSH: 1.85 u[IU]/mL (ref 0.350–4.500)

## 2014-10-03 LAB — T3, FREE: T3, Free: 2.9 pg/mL (ref 2.3–4.2)

## 2014-10-03 NOTE — Progress Notes (Signed)
Subjective:    Patient ID: Desiree Gutierrez, female    DOB: [birth year], 24 y.o.   MRN: [id number]  HPI  Desiree Gutierrez is a 24 year old female with a history of Wolff-Parkinson-White syndrome status post ablation, postural orthostatic tachycardia syndrome recurrent syncope who has had multiple ED presentations for syncope.  Her most recent presentation was yesterday when she presented with near syncope, diaphoresis, shortness of breath and chest tightness. She had troponins which came back negative, EKG was unchanged from prior, chest x-ray showed no acute cardiopulmonary process and she was subsequently discharged.  She is accompanied by her professor from school who also confirms this history including the fact that she has had syncopal episodes and a chemistry class, in front of the school and hit her head in the process of having a concussion.  She describes this as increasing heart rate and feeling unwell and subsequently passing out and up service mentioned to her that no seizure activity was ever detected but she did look cyanotic and extremities. The patient tells me she has informed her cardiologist about this; her last visit with him was in March of this in the next visit is not until September. She complains of parasternal chest pain 7/10 which sometimes radiates to the left arm and back and right now she is currently having chest pain. She has noticed diaphoresis which is new and occurs when she is having an episode of chest pain. She wakes up being sweaty and her sheets are wet  Past Medical History  Diagnosis Date  . WPW (Wolff-Parkinson-White syndrome)     a. s/p ablation (2011) Dr Gevena Cotton at [facility] (posterior lateral pathway)  . Vasovagal syncope     a. positive tilt 2011 b. failed medical therapy with Florinef, Midodrine, Inderal, Celexa  . POTS (postural orthostatic tachycardia syndrome)   . Palpitations     a. s/p MDT ILR implanted by Dr Alfonso Ellis [facility])  . Headache   .  Syncope     recurrent, daily    Past Surgical History  Procedure Laterality Date  . Cardiac electrophysiology study and ablation  2011    Dr. Gevena Cotton at [facility]- left posterior pathway ablation for WPW  . Tilt table study  2011    neurally mediated syncope  . Loop recorder implant  01/22/2014    MDT LINQ implanted by Dr Alfonso Ellis at [facility] for evaluation of palpitations/syncope    Family History  Problem Relation Age of Onset  . Diabetes Mother   . Hypertension Mother   . Stroke Mother   . Hypertension Brother     History   Social History  . Marital Status: Single    Spouse Name: N/A  . Number of Children: N/A  . Years of Education: N/A   Occupational History  . Not on file.   Social History Main Topics  . Smoking status: Never Smoker   . Smokeless tobacco: Not on file  . Alcohol Use: No  . Drug Use: No  . Sexual Activity: Not on file   Other Topics Concern  . Not on file   Social History Narrative    Allergies  Allergen Reactions  . Amitiza [Lubiprostone] Anaphylaxis, Shortness Of Breath and Swelling  . Ketorolac Palpitations  . Metoclopramide Other (See Comments)    Heart raced  . Midodrine Other (See Comments)    migraines  . Fludrocortisone Rash    Current Outpatient Prescriptions on File Prior to Visit  Medication Sig Dispense Refill  . naproxen  sodium (ANAPROX) 220 MG tablet Take 220 mg by mouth 2 (two) times daily as needed (for pain).     No current facility-administered medications on file prior to visit.     Review of Systems  Constitutional: Positive for diaphoresis. Negative for activity change, appetite change and fatigue.  HENT: Negative for congestion, sinus pressure and sore throat.   Eyes: Negative for visual disturbance.  Respiratory: Positive for shortness of breath. Negative for cough, chest tightness and wheezing.   Cardiovascular: Positive for chest pain. Negative for palpitations.  Gastrointestinal: Negative for abdominal  pain, constipation and abdominal distention.  Endocrine: Negative for polydipsia.  Genitourinary: Negative for dysuria and frequency.  Musculoskeletal: Negative for back pain and arthralgias.  Skin: Negative for rash.  Neurological: Positive for syncope and light-headedness. Negative for tremors and numbness.  Hematological: Does not bruise/bleed easily.  Psychiatric/Behavioral: Negative for behavioral problems and agitation.         Objective: Filed Vitals:   10/03/14 1017 10/03/14 1019  BP:  95/66  Pulse:  90  Temp:  98.3 F (36.8 C)  TempSrc:  Oral  Resp:  18  Height: 5\' 3"  (1.6 m)   Weight: 134 lb 3.2 oz (60.873 kg)   SpO2:  100%      Physical Exam  Constitutional: She is oriented to person, place, and time. She appears well-developed and well-nourished. No distress.  HENT:  Head: Normocephalic.  Right Ear: External ear normal.  Left Ear: External ear normal.  Nose: Nose normal.  Mouth/Throat: Oropharynx is clear and moist.  Eyes: Conjunctivae and EOM are normal. Pupils are equal, round, and reactive to light.  Neck: Normal range of motion. No JVD present.  Cardiovascular: Normal rate, regular rhythm, normal heart sounds and intact distal pulses.  Exam reveals no gallop.   No murmur heard. Pulmonary/Chest: Effort normal and breath sounds normal. No respiratory distress. She has no wheezes. She has no rales. She exhibits no tenderness.  Abdominal: Soft. Bowel sounds are normal. She exhibits no distension and no mass. There is no tenderness.  Musculoskeletal: Normal range of motion. She exhibits no edema or tenderness.  Neurological: She is alert and oriented to person, place, and time. She has normal reflexes.  Skin: Skin is warm and dry. She is not diaphoretic.  Psychiatric: She has a normal mood and affect.    Lab Results  Component Value Date   TSH 3.901 06/25/2014    Troponin (Point of Care Test)  Recent Labs  10/02/14 1711  TROPIPOC 0.01    CLINICAL  DATA: Fell from standing, syncopal episode lasted 2-3 minutes. Headache and neck pain. History of postural orthostatic tachycardia syndrome.  EXAM: CT HEAD WITHOUT CONTRAST  CT CERVICAL SPINE WITHOUT CONTRAST  TECHNIQUE: Multidetector CT imaging of the head and cervical spine was performed following the standard protocol without intravenous contrast. Multiplanar CT image reconstructions of the cervical spine were also generated.  COMPARISON: CT of the head May 31, 2014  FINDINGS: CT HEAD FINDINGS  The ventricles and sulci are normal. No intraparenchymal hemorrhage, mass effect nor midline shift. No acute large vascular territory infarcts.  No abnormal extra-axial fluid collections. Basal cisterns are patent.  No skull fracture. The included ocular globes and orbital contents are non-suspicious. The mastoid aircells and included paranasal sinuses are well-aerated.  CT CERVICAL SPINE FINDINGS  Cervical vertebral bodies and posterior elements are intact and aligned with maintenance of the cervical lordosis. Intervertebral disc heights preserved. No destructive bony lesions. C1-2 articulation maintained. Included  prevertebral and paraspinal soft tissues are nonsuspicious; mild heterogeneous thyroid attenuation without dominant nodule (Normal thyroid function studies 2016).  IMPRESSION: CT HEAD: Normal noncontrast CT of the head.  CT CERVICAL SPINE: Straightened cervical lordosis without acute fracture or malalignment.   Electronically Signed  By: Awilda Metro  On: 07/30/2014 02:06     Assessment & Plan:  24 year old female with a history of Wolff-Parkinson-White syndrome, currently wearing a loop recorder, postural orthostatic tachycardia syndrome, vasovagal syncope who is currently being workup for recurrent syncope.  Vasovagal syncope: Recurrent. Patient advised against driving. Advised to keep appointment with neurology for 10/13/14  to exclude seizures.  Chest pains: EKG in the clinic today reveals normal sinus rhythm at a heart rate of 84 beats a minute. I have advised her to call her urologist at [facility] so she can get in sooner to see him.  Wolff-Parkinson-White syndrome status post ablation: Currently wearing a loop recorder. Advised to keep appointments with cardiology at [facility]- Dr Alfonso Ellis  Diaphoresis: TSH, CBC, urine VMA ordered Patient to come in for PPD placement next week to exclude TB.   ED precautions discussed.   This note has been created with Education officer, environmental. Any transcriptional errors are unintentional.

## 2014-10-03 NOTE — Progress Notes (Signed)
Patient here for HFU. Patient would like to establish care here with a PCP. Patient reports she has been sweaty all over this morning, dizzy, weak all over, and chest pain on left side. Pain currently in chest, rated at 7. Pain has been pretty constant lately. Pain has been located in the center lately but patient reports sometimes it radiates to her arms and back. Patient reports when the pain is present she is really SOB. Pain described as tight and sometimes sharp.   Patient is really concerned about breaking out in sweats and not knowing why. Patient reports she passed out after being discharged and was really sweaty. Patient said she notices lately she's really sweaty when she passes out which is new for her.   Patient is accompanied by her chemistry professor.

## 2014-10-07 ENCOUNTER — Telehealth: Payer: Self-pay | Admitting: *Deleted

## 2014-10-07 NOTE — Telephone Encounter (Signed)
-----   Message from Jaclyn ShaggyEnobong Amao, MD sent at 10/04/2014  8:10 AM EDT ----- Please inform the patient that labs are normal. Thank you.

## 2014-10-07 NOTE — Telephone Encounter (Signed)
Verified name and date of birth and gave results of normal labs 

## 2014-10-08 ENCOUNTER — Ambulatory Visit: Payer: Medicaid Other | Attending: Internal Medicine | Admitting: *Deleted

## 2014-10-08 DIAGNOSIS — Z0000 Encounter for general adult medical examination without abnormal findings: Secondary | ICD-10-CM | POA: Diagnosis not present

## 2014-10-08 LAB — VMA, RANDOM URINE
CREATININE, RANDOM URINE: 55 mg/dL (ref 20–320)
VMA/Crt, Random U: 3.6 mg/g creat (ref 1.1–4.1)

## 2014-10-08 NOTE — Progress Notes (Signed)
Patient presents for PPD placement  Denies previous positive TB test  Denies known exposure to TB   Tuberculin skin test applied to left ventral forearm.  Patient aware that she needs to return in 48 hours for PPD reading

## 2014-10-09 ENCOUNTER — Emergency Department (HOSPITAL_COMMUNITY)
Admission: EM | Admit: 2014-10-09 | Discharge: 2014-10-09 | Disposition: A | Discharge status: Home / Self Care | Payer: Medicaid Other | Attending: Emergency Medicine | Admitting: Emergency Medicine

## 2014-10-09 ENCOUNTER — Encounter (HOSPITAL_COMMUNITY): Payer: Self-pay | Admitting: Emergency Medicine

## 2014-10-09 DIAGNOSIS — R55 Syncope and collapse: Secondary | ICD-10-CM | POA: Insufficient documentation

## 2014-10-09 LAB — CBC
HEMATOCRIT: 38.8 % (ref 36.0–46.0)
Hemoglobin: 12.8 g/dL (ref 12.0–15.0)
MCH: 28.1 pg (ref 26.0–34.0)
MCHC: 33 g/dL (ref 30.0–36.0)
MCV: 85.3 fL (ref 78.0–100.0)
Platelets: 164 10*3/uL (ref 150–400)
RBC: 4.55 MIL/uL (ref 3.87–5.11)
RDW: 12.1 % (ref 11.5–15.5)
WBC: 7.7 10*3/uL (ref 4.0–10.5)

## 2014-10-09 LAB — BASIC METABOLIC PANEL
Anion gap: 11 (ref 5–15)
BUN: 11 mg/dL (ref 6–20)
CHLORIDE: 104 mmol/L (ref 101–111)
CO2: 24 mmol/L (ref 22–32)
Calcium: 9.8 mg/dL (ref 8.9–10.3)
Creatinine, Ser: 0.81 mg/dL (ref 0.44–1.00)
GFR calc non Af Amer: >60 mL/min (ref >60)
GLUCOSE: 70 mg/dL (ref 65–99)
POTASSIUM: 3.9 mmol/L (ref 3.5–5.1)
Sodium: 139 mmol/L (ref 135–145)

## 2014-10-09 LAB — MAGNESIUM: Magnesium: 2 mg/dL (ref 1.7–2.4)

## 2014-10-09 MED ORDER — SODIUM CHLORIDE 0.9 % IV BOLUS (SEPSIS): 1000.0000 mL | Freq: Once | INTRAVENOUS | Status: DC

## 2014-10-09 NOTE — ED Notes (Signed)
[facility] with c/o syncopal episode. Pt has hx of syncopal episodes and been seen for each. Stated that she had 2 episodes this week. Pt also has Wolfe Parkinson white syndrome. Pt stated that her HR increases, starts sweating, then passes out. Pt has no complaints at this time.

## 2014-10-09 NOTE — ED Notes (Signed)
Dr. Gwendolyn GrantWalden in room with patient.

## 2014-10-09 NOTE — ED Provider Notes (Signed)
CSN: [id number]     Arrival date & time 10/09/14  1639 History   First MD Initiated Contact with Patient 10/09/14 1641     Chief Complaint  Patient presents with  . Loss of Consciousness     (Consider location/radiation/quality/duration/timing/severity/associated sxs/prior Treatment) Patient is a 24 y.o. female presenting with syncope. The history is provided by the patient.  Loss of Consciousness Episode history:  Single Most recent episode:  Today Timing:  Intermittent Progression:  Unchanged Chronicity:  Chronic Context: standing up (had been standing up for a while, didn't happen immediately after standing)   Relieved by:  Nothing Ineffective treatments:  None tried Associated symptoms: diaphoresis and palpitations   Associated symptoms: no chest pain, no fever, no shortness of breath and no vomiting     Past Medical History  Diagnosis Date  . WPW (Wolff-Parkinson-White syndrome)     a. s/p ablation (2011) Dr Gevena Cotton at [facility] (posterior lateral pathway)  . Vasovagal syncope     a. positive tilt 2011 b. failed medical therapy with Florinef, Midodrine, Inderal, Celexa  . POTS (postural orthostatic tachycardia syndrome)   . Palpitations     a. s/p MDT ILR implanted by Dr Alfonso Ellis [facility])  . Headache   . Syncope     recurrent, daily   Past Surgical History  Procedure Laterality Date  . Cardiac electrophysiology study and ablation  2011    Dr. Gevena Cotton at [facility]- left posterior pathway ablation for WPW  . Tilt table study  2011    neurally mediated syncope  . Loop recorder implant  01/22/2014    MDT LINQ implanted by Dr Alfonso Ellis at [facility] for evaluation of palpitations/syncope   Family History  Problem Relation Age of Onset  . Diabetes Mother   . Hypertension Mother   . Stroke Mother   . Hypertension Brother    History  Substance Use Topics  . Smoking status: Never Smoker   . Smokeless tobacco: Not on file  . Alcohol Use: No   OB History    No data available      Review of Systems  Constitutional: Positive for diaphoresis. Negative for fever and chills.  Respiratory: Negative for shortness of breath.   Cardiovascular: Positive for palpitations and syncope. Negative for chest pain and leg swelling.  Gastrointestinal: Negative for vomiting.  All other systems reviewed and are negative.     Allergies  Amitiza; Ketorolac; Metoclopramide; Midodrine; and Fludrocortisone  Home Medications   Prior to Admission medications   Medication Sig Start Date End Date Taking? Authorizing Provider  naproxen sodium (ANAPROX) 220 MG tablet Take 220 mg by mouth 2 (two) times daily as needed (for pain).    Historical Provider, MD   BP 114/72 mmHg  Pulse 87  Temp(Src) 99.5 F (37.5 C) (Oral)  Resp 20  SpO2 100%  LMP 09/19/2014 Physical Exam  Constitutional: She is oriented to person, place, and time. She appears well-developed and well-nourished. No distress.  HENT:  Head: Normocephalic and atraumatic.  Mouth/Throat: Oropharynx is clear and moist.  Eyes: EOM are normal. Pupils are equal, round, and reactive to light.  Neck: Normal range of motion. Neck supple.  Cardiovascular: Normal rate and regular rhythm.  Exam reveals no friction rub.   No murmur heard. Pulmonary/Chest: Effort normal and breath sounds normal. No respiratory distress. She has no wheezes. She has no rales.  Abdominal: Soft. She exhibits no distension. There is no tenderness. There is no rebound.  Musculoskeletal: Normal range of motion. She exhibits  no edema.  Neurological: She is alert and oriented to person, place, and time.  Skin: Skin is warm. No rash noted. She is not diaphoretic.  Nursing note and vitals reviewed.   ED Course  Procedures (including critical care time) Labs Review Labs Reviewed  CBC  BASIC METABOLIC PANEL  MAGNESIUM    Imaging Review No results found.   EKG Interpretation None      MDM   Final diagnoses:  Syncope, unspecified syncope type     24 year old female with history of POTS presents with syncope. She currently undergoing a workup for recurrent syncope and has follow-up with urology next week to ensure these are seizures. She described her typical episode of diaphoresis, palpitations, passing out. She's failed prior medication attempts to control this. She did not respond to midodrine or florinef. Hx of WPW that was ablated. No delta wave on EKG today. Here well appearing. Vitals are stable. We'll check labs and EKG. She had thyroid studies done recently which were normal. Will hydrate by mouth.   EKG ok, sinus tachycardia, similar to prior. Patient's labs ok. Instructed to f/u with PCP, as she's undergoing a workup for these symptoms at this time.  Elwin MochaBlair Abraham Margulies, MD 10/10/14 [phone]

## 2014-10-10 ENCOUNTER — Ambulatory Visit: Payer: Self-pay | Attending: Internal Medicine | Admitting: *Deleted

## 2014-10-10 DIAGNOSIS — Z0000 Encounter for general adult medical examination without abnormal findings: Secondary | ICD-10-CM

## 2014-10-10 LAB — TB SKIN TEST
Induration: 0 mm
TB Skin Test: NEGATIVE

## 2014-10-10 NOTE — Progress Notes (Signed)
Patient presents for PPD reading 0 induration Test negative

## 2014-10-13 ENCOUNTER — Ambulatory Visit: Payer: Self-pay | Admitting: Neurology

## 2014-10-16 ENCOUNTER — Emergency Department (HOSPITAL_COMMUNITY): Payer: Medicaid Other

## 2014-10-16 ENCOUNTER — Encounter (HOSPITAL_COMMUNITY): Payer: Self-pay | Admitting: Emergency Medicine

## 2014-10-16 ENCOUNTER — Inpatient Hospital Stay (HOSPITAL_COMMUNITY)
Admission: EM | Admit: 2014-10-16 | Discharge: 2014-10-18 | DRG: 312 | Disposition: A | Discharge status: Home / Self Care | Payer: Medicaid Other | Attending: Internal Medicine | Admitting: Internal Medicine

## 2014-10-16 DIAGNOSIS — Z23 Encounter for immunization: Secondary | ICD-10-CM | POA: Diagnosis not present

## 2014-10-16 DIAGNOSIS — Z95818 Presence of other cardiac implants and grafts: Secondary | ICD-10-CM | POA: Diagnosis not present

## 2014-10-16 DIAGNOSIS — Z888 Allergy status to other drugs, medicaments and biological substances status: Secondary | ICD-10-CM | POA: Diagnosis not present

## 2014-10-16 DIAGNOSIS — I456 Pre-excitation syndrome: Secondary | ICD-10-CM | POA: Diagnosis present

## 2014-10-16 DIAGNOSIS — I951 Orthostatic hypotension: Secondary | ICD-10-CM

## 2014-10-16 DIAGNOSIS — G90A Postural orthostatic tachycardia syndrome (POTS): Secondary | ICD-10-CM | POA: Diagnosis present

## 2014-10-16 DIAGNOSIS — D509 Iron deficiency anemia, unspecified: Secondary | ICD-10-CM | POA: Diagnosis present

## 2014-10-16 DIAGNOSIS — R000 Tachycardia, unspecified: Secondary | ICD-10-CM

## 2014-10-16 DIAGNOSIS — I498 Other specified cardiac arrhythmias: Secondary | ICD-10-CM | POA: Diagnosis present

## 2014-10-16 DIAGNOSIS — W19XXXA Unspecified fall, initial encounter: Secondary | ICD-10-CM | POA: Diagnosis present

## 2014-10-16 DIAGNOSIS — R55 Syncope and collapse: Principal | ICD-10-CM | POA: Diagnosis present

## 2014-10-16 DIAGNOSIS — Z886 Allergy status to analgesic agent status: Secondary | ICD-10-CM

## 2014-10-16 LAB — BASIC METABOLIC PANEL
ANION GAP: 6 (ref 5–15)
BUN: 9 mg/dL (ref 6–20)
CALCIUM: 8.6 mg/dL — AB (ref 8.9–10.3)
CHLORIDE: 110 mmol/L (ref 101–111)
CO2: 23 mmol/L (ref 22–32)
CREATININE: 0.71 mg/dL (ref 0.44–1.00)
GFR calc Af Amer: >60 mL/min (ref >60)
Glucose, Bld: 91 mg/dL (ref 65–99)
POTASSIUM: 3.9 mmol/L (ref 3.5–5.1)
Sodium: 139 mmol/L (ref 135–145)

## 2014-10-16 LAB — CBC
HCT: 32.6 % — ABNORMAL LOW (ref 36.0–46.0)
Hemoglobin: 10.5 g/dL — ABNORMAL LOW (ref 12.0–15.0)
MCH: 27.7 pg (ref 26.0–34.0)
MCHC: 32.2 g/dL (ref 30.0–36.0)
MCV: 86 fL (ref 78.0–100.0)
Platelets: 183 10*3/uL (ref 150–400)
RBC: 3.79 MIL/uL — ABNORMAL LOW (ref 3.87–5.11)
RDW: 12.2 % (ref 11.5–15.5)
WBC: 6 10*3/uL (ref 4.0–10.5)

## 2014-10-16 LAB — CBG MONITORING, ED: Glucose-Capillary: 91 mg/dL (ref 65–99)

## 2014-10-16 LAB — BRAIN NATRIURETIC PEPTIDE: B Natriuretic Peptide: 7.8 pg/mL (ref 0.0–100.0)

## 2014-10-16 MED ORDER — ENOXAPARIN SODIUM 40 MG/0.4ML ~~LOC~~ SOLN
40.0000 mg | SUBCUTANEOUS | Status: DC
  Filled 2014-10-16 (×2): qty 0.4

## 2014-10-16 MED ORDER — SODIUM CHLORIDE 0.9 % IV SOLN
INTRAVENOUS | Status: AC
  Administered 2014-10-16: 22:00:00 via INTRAVENOUS

## 2014-10-16 MED ORDER — PNEUMOCOCCAL VAC POLYVALENT 25 MCG/0.5ML IJ INJ
0.5000 mL | INJECTION | INTRAMUSCULAR | Status: AC
  Administered 2014-10-17: 0.5 mL via INTRAMUSCULAR
  Filled 2014-10-16: qty 0.5

## 2014-10-16 MED ORDER — SODIUM CHLORIDE 0.9 % IV BOLUS (SEPSIS)
1000.0000 mL | Freq: Once | INTRAVENOUS | Status: AC
  Administered 2014-10-16: 1000 mL via INTRAVENOUS

## 2014-10-16 NOTE — H&P (Addendum)
Date: 10/16/2014               Patient Name:  Desiree Gutierrez MRN: [id number]  DOB: [birth year] Age / Sex: 24 y.o., female   PCP: Ambrose Finland, NP         Medical Service: Internal Medicine Teaching Service         Attending Physician: Dr. Doneen Poisson, MD    First Contact: Cristela Felt, MS4 Pager: [phone]  Second Contact: Dr. Mariea Clonts Pager: [phone]       After Hours (After 5p/  First Contact Pager: [phone]  weekends / holidays): Second Contact Pager: [phone]   Chief Complaint: syncope  History of Present Illness: Desiree Gutierrez is a 24 yo female with WPW s/p ablation (2011), POTS, and recurrent syncope, presenting with an episode of syncope.  She has been having increasingly frequent presyncope/syncope episodes since January, necessitating multiple trips to the ED and an admission in March. She is now having nearly daily episodes, described as heart racing, and then she becomes sweaty, dizzy, and lightheaded.  She reports multiple instances of loss of consciousness, usually lasting less than 5 minutes.  She denies loss of bowel or bladder control or tongue biting.  She endorses sometimes needing a few minutes to start thinking clearly again.  She has no history of seizure.  These episodes occur at all times of the day and in all situations, including waking her from sleep.    She has an implantable loop recorder, set to record for HR>200.  It was not interrogated in the ED. No tachycardic episodes above threshold have been noted on previous interrogations.  She is managed by Dr. Alfonso Ellis at [facility].  She has not been seen since March, and her next appointment is not scheduled until September.  In her previous workup, tilt table test was positive for vasovagal syncope.  She was also referred to Neurology for evaluation of neurogenic syncope.  She reports she needs to reschedule the appointment.  Echo in March 2016 was unremarkable.  She endorses drinking lots of fluids, but denies adding  salt to foods.  She also reports that her legs (knees to feet) and arms (elbows to hands) will turn blue.  It can be painful when it occurs.  It only lasts about 5 minutes.  It does not only occur in cold settings.  She has no history of rheumatologic disease.    She denies HA, vision changes, chest pain, SOB, bowel or bladder changes, weakness, or numbness.  Meds: Current Facility-Administered Medications  Medication Dose Route Frequency Provider Last Rate Last Dose  . 0.9 %  sodium chloride infusion   Intravenous STAT Tery Sanfilippo, MD 75 mL/hr at 10/16/14 2211    . enoxaparin (LOVENOX) injection 40 mg  40 mg Subcutaneous Q24H Tasrif Ahmed, MD   40 mg at 10/16/14 2215  . [START ON 10/17/2014] pneumococcal 23 valent vaccine (PNU-IMMUNE) injection 0.5 mL  0.5 mL Intramuscular Tomorrow-1000 Doneen Poisson, MD        Allergies: Allergies as of 10/16/2014 - Review Complete 10/16/2014  Allergen Reaction Noted  . Amitiza [lubiprostone] Anaphylaxis, Shortness Of Breath, and Swelling 07/17/2013  . Ketorolac Palpitations 08/26/2014  . Metoclopramide Other (See Comments) 01/07/2014  . Midodrine Other (See Comments) 05/25/2014  . Fludrocortisone Rash 07/08/2014   Past Medical History  Diagnosis Date  . WPW (Wolff-Parkinson-White syndrome)     a. s/p ablation (2011) Dr Gevena Cotton at [facility] (posterior lateral pathway)  . Vasovagal syncope  a. positive tilt 2011 b. failed medical therapy with Florinef, Midodrine, Inderal, Celexa  . POTS (postural orthostatic tachycardia syndrome)   . Palpitations     a. s/p MDT ILR [facility]  . Headache   . Syncope     recurrent, daily   Past Surgical History  Procedure Laterality Date  . Cardiac electrophysiology study and ablation  2011    [facility] for WPW  . Tilt table study  2011    neurally mediated syncope  . Loop recorder implant  01/22/2014    [facility]   Family History  Problem Relation Age of Onset  . Diabetes Mother   . Hypertension Mother   . Stroke Mother   . Hypertension Brother    History   Social History  . Marital Status: Single    Spouse Name: N/A  . Number of Children: N/A  . Years of Education: N/A   Occupational History  . Not on file.   Social History Main Topics  . Smoking status: Never Smoker   . Smokeless tobacco: Not on file  . Alcohol Use: No  . Drug Use: No  . Sexual Activity: Not on file   Other Topics Concern  . Not on file   Social History Narrative    Review of Systems: Pertinent items are noted in HPI.  Physical Exam: Blood pressure 117/75, pulse 80, temperature 99.7 F (37.6 C), temperature source Oral, resp. rate 15, height 5' 3.6" (1.615 m), weight 133 lb 13.1 oz (60.7 kg), last menstrual period 09/19/2014, SpO2 100 %. Physical Exam  Constitutional: She is oriented to person, place, and time. She appears well-developed and well-nourished. No distress.  HENT:  Head: Normocephalic and atraumatic.  Eyes: EOM are normal. Pupils are equal, round, and reactive to light.  Neck: Normal range of motion. No tracheal deviation present.  Cardiovascular: Normal rate, regular rhythm, normal heart sounds and intact distal pulses.   Pulmonary/Chest: Effort normal and breath sounds normal. No respiratory distress. She has no wheezes.  Abdominal: Soft. Bowel sounds are normal. She exhibits no distension. There is no tenderness. There is no rebound and no guarding.  Musculoskeletal: Normal range of motion. She exhibits no edema.  Neurological: She is alert and oriented to person, place, and time.  Skin: Skin is warm and dry. No rash noted. She is not diaphoretic.     Lab results: Basic Metabolic Panel:  Recent Labs  19/14/7807/14/16 1729  NA 139  K 3.9  CL 110  CO2 23  GLUCOSE 91  BUN 9  CREATININE 0.71  CALCIUM 8.6*   Liver Function Tests: No  results for input(s): AST, ALT, ALKPHOS, BILITOT, PROT, ALBUMIN in the last 72 hours. No results for input(s): LIPASE, AMYLASE in the last 72 hours. No results for input(s): AMMONIA in the last 72 hours. CBC:  Recent Labs  10/16/14 1729  WBC 6.0  HGB 10.5*  HCT 32.6*  MCV 86.0  PLT 183   Cardiac Enzymes: No results for input(s): CKTOTAL, CKMB, CKMBINDEX, TROPONINI in the last 72 hours. BNP: No results for input(s): PROBNP in the last 72 hours. D-Dimer: No results for input(s): DDIMER in the last 72 hours. CBG:  Recent Labs  10/16/14 1601  GLUCAP 91   Hemoglobin A1C: No results for input(s): HGBA1C in the last 72 hours. Fasting Lipid Panel: No results for input(s): CHOL, HDL, LDLCALC,  TRIG, CHOLHDL, LDLDIRECT in the last 72 hours. Thyroid Function Tests: No results for input(s): TSH, T4TOTAL, FREET4, T3FREE, THYROIDAB in the last 72 hours. Anemia Panel: No results for input(s): VITAMINB12, FOLATE, FERRITIN, TIBC, IRON, RETICCTPCT in the last 72 hours. Coagulation: No results for input(s): LABPROT, INR in the last 72 hours. Urine Drug Screen: Drugs of Abuse     Component Value Date/Time   LABOPIA NONE DETECTED 09/03/2014 2223   COCAINSCRNUR NONE DETECTED 09/03/2014 2223   LABBENZ NONE DETECTED 09/03/2014 2223   AMPHETMU NONE DETECTED 09/03/2014 2223   THCU NONE DETECTED 09/03/2014 2223   LABBARB NONE DETECTED 09/03/2014 2223    Alcohol Level: No results for input(s): ETH in the last 72 hours. Urinalysis: No results for input(s): COLORURINE, LABSPEC, PHURINE, GLUCOSEU, HGBUR, BILIRUBINUR, KETONESUR, PROTEINUR, UROBILINOGEN, NITRITE, LEUKOCYTESUR in the last 72 hours.  Invalid input(s): APPERANCEUR   Imaging results:  Dg Cervical Spine 2-3 Views  10/16/2014   CLINICAL DATA:  Larey Seat and injured neck today.  EXAM: CERVICAL SPINE - 2-3 VIEW  COMPARISON:  09/24/2014.  FINDINGS: Examination performed in a cervical collar.  The cervical vertebral bodies are normally  aligned. Disc spaces and vertebral bodies are maintained. No significant degenerative changes. No acute bony findings or abnormal prevertebral soft tissue swelling. The facets are normally aligned. The C1-2 articulations are maintained. The lung apices are clear.  IMPRESSION: Normal alignment and no acute bony findings.   Electronically Signed   By: Rudie Meyer M.D.   On: 10/16/2014 16:31    Other results: EKG: sinus rhythm, q-waves in II, III, aVF, and V4-V6, early R progression in V3.  Assessment & Plan by Problem: Principal Problem:   Syncope Active Problems:   WOLFF-PARKINSON-WHITE (WPW) SYNDROME status post ablation   POTS (postural orthostatic tachycardia syndrome)   Iron deficiency anemia   Fall  Desiree Gutierrez is a 24 yo female with WPW s/p ablation (2011) and POTS, presenting with recurrent syncope.  Syncope:  The cause of her recurrent syncope is very unclear, and likely a combination of etiologies.  She has a h/o WPW s/p ablation, but is still experiencing symptomatic tachycardic episodes.  However, she also has a diagnosis of POTS, which would also explain tachycardia and presyncope.  POTS may also explain her blue legs/arms if attributed to venous pooling.  In addition, she carries a diagnosis of vasovagal syncope.  It is possible she still has an accessory pathway of WPW not seen on ECG, or her q-waves are reflected delta-waves.  It is possible her tachycardia, as a result of WPW or POTS, is causing her to vasovagal.  It is possible she is chronically volume down and repeatedly orthostatic.  She was not orthostatic in the ED, but she was s/p IVF by EMS.  At this time, she has not followed up with neurology for evaluation.  Thus far, she has failed Midodrine and Fludrocortisone, the only two medications with proven efficacy.  Acetylcholinesterase inhibitors do not have sufficient evidence to prove efficacy. At this time, will give IVF and promote increased salt intake.  Has seen  cardiologists at [facility] and also at [facility], with appointments at both. [facility] cardiologist is Dr. Read Drivers [phone], appt on 10/28/14) and [facility] cardiologist is Dr. Alfonso Ellis [phone]) appt on 12/18/14.  - IVF: NS @ 125 ml/hr - Consider cardiology/EP consult in AM - Consider neurology consult in AM - [place] - Was unable to interrogate loop recorder overnight.  Consider interrogation in AM  Fe Def Anemia: Hgb 10.5 (  baselien 10-11). Last iron panel in March.  She has a history of menorrhagia.  She is not taking PO iron and has not followed up with OB/Gyn. - PO iron - Outpatient OB/Gyn f/u  DVT Ppx: Lovenox  Dispo: Disposition is deferred at this time, awaiting improvement of current medical problems.   The patient does have a current PCP Ambrose Finland, NP) and does need an [facility] hospital follow-up appointment after discharge.  The patient does not have transportation limitations that hinder transportation to clinic appointments.  Signed: [name], MD, PhD 10/16/2014, 10:38 PM   H&P accidently co-signed by Ms. Beachy, hence why addendum is at bottom of note and my name appears on Dr. Lubertha Basque H&P.  Internal Medicine Attending Admission Note Date: 10/17/2014  Patient name: Desiree Gutierrez Medical record number: [id number] Date of birth: 28-Dec-1990 Age: 24 y.o. Gender: female  I saw and evaluated the patient. I reviewed the resident's note and I agree with the resident's findings and plan as documented in the resident's note.  Chief Complaint(s): Syncope  History - key components related to admission:  Mr. 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 Of note, she had a loop recorder placed in October 2015 to evaluate these episodes and per report interrogation has failed to reveal a tachycardic arrhythmia as a cause. She currently notes daily episodes which are preceded by palpitations, diaphoresis, and lightheadedness. These episodes can last anywhere from 5-30 minutes. They are not associated with a prolonged post ictal state, loss of bowel or bladder control, or tongue biting. They are also not associated with headaches, visual changes, numbness, weakness, chest pain, or shortness of breath. She presented to the emergency department after her most recent episode and was admitted to the internal medicine teaching service for further evaluation and care.  When seen on rounds in the morning she denied any other acute complaints and was feeling well.  Physical Exam - key components related to admission:  Filed Vitals:   10/16/14 2143 10/16/14 2200 10/16/14 2205 10/17/14 0501  BP: 118/64 116/72 117/75 95/62  Pulse: 83 80 80 89  Temp:  99 F (37.2 C) 99.7 F (37.6 C) 98.7 F (37.1 C)  TempSrc:  Oral Oral Oral  Resp: 11 15 15 15   Height:   5' 3.6" (1.615 m)   Weight:   133 lb 13.1 oz (60.7 kg) 135 lb 5.8 oz (61.4 kg)  SpO2: 100% 100% 100% 100%   Gen.: Well-developed, well-nourished, woman lying comfortably in bed in no acute distress. Heart: Regular rate and rhythm without murmurs, rubs, or gallops.  Lab results:  Basic Metabolic Panel:  Recent Labs  40/98/11 1729  NA 139  K 3.9  CL 110  CO2 23  GLUCOSE 91  BUN 9  CREATININE 0.71  CALCIUM 8.6*   CBC:  Recent Labs  10/16/14 1729  WBC 6.0  HGB 10.5*  HCT 32.6*  MCV 86.0  PLT 183   Cardiac Enzymes:  Recent Labs  10/17/14 0630  TROPONINI <0.03   CBG:  Recent Labs  10/16/14 1601  GLUCAP 91   Urinalysis:  Specific gravity 1.0-2, pH  5.0,  negative nitrite, negative leukocytes.  Misc. Labs:  BNP 7.8 Urine pregnancy test negative  Imaging results:  Dg Cervical Spine 2-3 Views  10/16/2014   CLINICAL DATA:  Larey Seat and injured neck today.  EXAM: CERVICAL SPINE - 2-3 VIEW  COMPARISON:  09/24/2014.  FINDINGS: Examination performed in a cervical collar.  The cervical vertebral bodies are normally aligned. Disc spaces and vertebral bodies are maintained. No significant degenerative changes. No acute bony findings or abnormal prevertebral soft tissue swelling. The facets are normally aligned. The C1-2 articulations are maintained. The lung apices are clear.  IMPRESSION: Normal alignment and no acute bony findings.   Electronically Signed   By: Rudie Meyer M.D.   On: 10/16/2014 16:31   Other results:  EKG: Normal sinus rhythm at 97 bpm, normal axis, normal intervals, small Q waves inferiorly, no ST segment changes, no T-wave changes, unchanged from the previous ECG on 10/03/2014.  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 Given her history the differential is quite extensive but the working diagnosis at this point is POTS versus another neurogenic syncope. If the reports from the previous interrogations of the implantable loop recorder are true, it is unlikely this represents a tachyarrhythmia, especially one related to WPW. Given the complexity of sorting this out she requires the services of an electrophysiologist.  1) Syncope: We will ask for the assistance of our electrophysiologists in sorting out the cause of her more frequent episodes of syncope and to recommend any needed intervention or evaluation. In the meantime, we will maintain telemetry, ask that the loop recorder be interrogated, and provide her with IV saline.  2) Disposition: Pending the evaluation and  recommendations from electrophysiology.

## 2014-10-16 NOTE — Progress Notes (Signed)
Received report

## 2014-10-16 NOTE — Progress Notes (Signed)
  Pt admitted to the unit. Pt is stable, alert and oriented per baseline. Oriented to room, staff, and call bell. Educated to call for any assistance. Bed in lowest position, call bell within reach- will continue to monitor. 

## 2014-10-16 NOTE — ED Provider Notes (Signed)
I saw and evaluated the patient, reviewed the resident's note and I agree with the findings and plan.   EKG Interpretation   Date/Time:  Thursday October 16 2014 15:32:06 EDT Ventricular Rate:  97 PR Interval:  118 QRS Duration: 67 QT Interval:  335 QTC Calculation: 425 R Axis:   68 Text Interpretation:  Sinus rhythm Borderline short PR interval Baseline  wander in lead(s) V1 No significant change since last tracing Confirmed by  Rosalea Withrow  MD, Cythnia Osmun ([id number]) on 10/16/2014 4:03:34 PM     Patient here after having a syncopal episode prior to arrival. History of pots syndrome as well as Wpw. Patient became dizzy prior to the event today. Her neurological exam is nonfocal. Schedule see cardiology in September will consult them to arrange close follow-up pending labs  Lorre NickAnthony Yulia Ulrich, MD 10/16/14 1614

## 2014-10-16 NOTE — ED Notes (Addendum)
Pt arrives via [facility] for syncopal episode while patient was walking across concrete floor, family reported pt was unconscious for about 3 minutes. Pt has hx of wolffe parkinson white syndrome and was seen here for similar issue last week. Pt alert, oriented. c collar in place. Pt hypotensive with ems, ns started via ems.

## 2014-10-16 NOTE — ED Notes (Signed)
Patient transported to X-ray 

## 2014-10-16 NOTE — ED Notes (Signed)
MD at bedside. 

## 2014-10-16 NOTE — ED Notes (Signed)
Transporting patient to new room assignment. 

## 2014-10-16 NOTE — ED Provider Notes (Signed)
CSN: [id number]     Arrival date & time 10/16/14  1528 History   First MD Initiated Contact with Patient 10/16/14 1536     Chief Complaint  Patient presents with  . Loss of Consciousness    (Consider location/radiation/quality/duration/timing/severity/associated sxs/prior Treatment) HPI  Patient is a 24 year old female with a history of by POTS and WPW status post ablation presented today for recurrent single episode. She reports she has single episodes every week. This was similar to that event except prior to event she awoke this morning with bilateral hands including blue and painful and swollen. This has recurred multiple times throughout the week with no alleviating factors. Today she was walking on the concrete felt palpitations became diaphoretic and syncopized. Currently complaining of no headache nausea vomiting chest pain or shortness breath. Is complaining of cervical spine pain.    Past Medical History  Diagnosis Date  . WPW (Wolff-Parkinson-White syndrome)     a. s/p ablation (2011) Dr [facility]ev[facility]immern [facility] (posterior lateral pathway)  . Vasovagal syncope     a. positive tilt 2011 b. failed medical therapy with Florinef, Midodrine, Inderal, Celexa  . POTS (postural orthostatic tachycardia syndrome)   . Palpitations     a. s/p MDT ILR implanted by Dr Alfonso EllisBeaty [facility]  . Headache   . Syncope     recurrent, daily   Past Surgical History  Procedure Laterality Date  . Cardiac electrophysiology study and ablation  2011    [facility]- left posterior pathway ablation for WPW  . Tilt table study  2011    neurally mediated syncope  . Loop recorder implant  01/22/2014    MDT LINQ [facility] for [facility] of palpitations/syncope   Family History  Problem Relation Age of Onset  . Diabetes Mother   . Hypertension Mother   . Stroke Mother   . Hypertension Brother    History  Substance Use Topics  . Smoking status: Never Smoker   . Smokeless  tobacco: Not on file  . Alcohol Use: No   OB History    No data available     Review of Systems  Constitutional: Negative for fever and chills.  HENT: Negative for congestion and sore throat.   Eyes: Negative for pain.  Respiratory: Negative for cough and shortness of breath.   Cardiovascular: Negative for chest pain and palpitations.  Gastrointestinal: Negative for nausea, vomiting, abdominal pain and diarrhea.  Genitourinary: Negative for dysuria and flank pain.  Musculoskeletal: Negative for back pain and neck pain.  Skin: Negative for rash.  Allergic/Immunologic: Negative.   Neurological: Positive for syncope and light-headedness. Negative for dizziness.  Psychiatric/Behavioral: Negative for confusion.    Allergies  Amitiza; Ketorolac; Metoclopramide; Midodrine; and Fludrocortisone  Home Medications   Prior to Admission medications   Not on File   BP 95/62 mmHg  Pulse 89  Temp(Src) 98.7 F (37.1 C) (Oral)  Resp 15  Ht 5' 3.6" (1.615 m)  Wt 135 lb 5.8 oz (61.4 kg)  BMI 23.54 kg/m2  SpO2 100%  LMP 09/19/2014 Physical Exam  Constitutional: She is oriented to person, place, and time. She appears well-developed and well-nourished. No distress.  HENT:  Head: Normocephalic and atraumatic. Head is without raccoon's eyes, without Battle's sign, without abrasion and without contusion.  Right Ear: No mastoid tenderness. No hemotympanum.  Left Ear: No mastoid tenderness. No hemotympanum.  Nose: No nasal septal hematoma.  Eyes: Conjunctivae and EOM are normal. Pupils are equal, round,  and reactive to light.  No hyphema bilaterally  Neck: Normal range of motion. Neck supple. Spinous process tenderness and muscular tenderness present.    Cardiovascular: Normal rate, regular rhythm, S1 normal, S2 normal and normal heart sounds.   Pulmonary/Chest: Effort normal and breath sounds normal. No respiratory distress.  Abdominal: Soft. Bowel sounds are normal. There is no  tenderness.  Musculoskeletal: Normal range of motion.  Neurological: She is alert and oriented to person, place, and time. She has normal strength and normal reflexes. She displays normal reflexes. No cranial nerve deficit or sensory deficit. She displays a negative Romberg sign. GCS eye subscore is 4. GCS verbal subscore is 5. GCS motor subscore is 6.  Normal finger to nose bilaterally.  Rapid alternating movements intact bilaterally.  Normal heal to shin bilaterally.   No pronator drift bilaterally.    Skin: Skin is warm and dry. She is not diaphoretic.  Psychiatric: She has a normal mood and affect.    ED Course  Procedures (including critical care time) Labs Review Labs Reviewed  BASIC METABOLIC PANEL - Abnormal; Notable for the following:    Calcium 8.6 (*)    All other components within normal limits  CBC - Abnormal; Notable for the following:    RBC 3.79 (*)    Hemoglobin 10.5 (*)    HCT 32.6 (*)    All other components within normal limits  BRAIN NATRIURETIC PEPTIDE  URINALYSIS, ROUTINE W REFLEX MICROSCOPIC (NOT AT [facility])  URINALYSIS, ROUTINE W REFLEX MICROSCOPIC (NOT AT [facility])  TROPONIN I  CBG MONITORING, ED  POC URINE PREG, ED  CBG MONITORING, ED  POCT PREGNANCY, URINE    Imaging Review Dg Cervical Spine 2-3 Views  10/16/2014   CLINICAL DATA:  Larey Seat and injured neck today.  EXAM: CERVICAL SPINE - 2-3 VIEW  COMPARISON:  09/24/2014.  FINDINGS: Examination performed in a cervical collar.  The cervical vertebral bodies are normally aligned. Disc spaces and vertebral bodies are maintained. No significant degenerative changes. No acute bony findings or abnormal prevertebral soft tissue swelling. The facets are normally aligned. The C1-2 articulations are maintained. The lung apices are clear.  IMPRESSION: Normal alignment and no acute bony findings.   Electronically Signed   By: Rudie Meyer M.D.   On: 10/16/2014 16:31     EKG Interpretation   Date/Time:  Thursday October 16 2014 15:32:06 EDT Ventricular Rate:  97 PR Interval:  118 QRS Duration: 67 QT Interval:  335 QTC Calculation: 425 R Axis:   68 Text Interpretation:  Sinus rhythm Borderline short PR interval Baseline  wander in lead(s) V1 No significant change since last tracing Confirmed by  Freida Busman  MD, ANTHONY ([id number]) on 10/16/2014 4:03:34 PM      MDM   Final diagnoses:  Syncope, unspecified syncope type    Patient is a 24 year old female with a history of by POTS and WPW status post ablation presented today for recurrent single episode.  On initial evaluation pt was HDS in NAD.  No tachycardia noted with no frank delta waves but borderline shortened PR interval. Trop negative. Orthostatics WNL.  UA with no signs of infection or dehydration.  BNP wnl.  CXR with no acute cardiopulmonary abnormalities. Pt had mild neck pain and plain films ordered displaying no acute fracture or abnormality.  Advised pt she would have to remain in collar for one week and f/u with PCP or neurosurgery to have collar cleared. No signs of basilar skull fracture, abrasion, or contusion and  do not feel CT head warranted.  I attempted to contact the patients cardiologist Dr. Patrici Ranks at [facility] but was unable to contact.  Pt reports not having f/u until September. Due to lack of follow up with recurrent syncopal episodes pt admitted to internal medicine teaching service for further observation and evauation by EP.   If performed, labs, EKGs, and imaging were reviewed/interpreted by myself and my attending and incorporated into medical decision making.  Discussed pertinent finding with patient or caregiver prior to discharge with no further questions.  Immediate return precautions given and pt or caregiver reports understanding.  Pt care supervised by my attending Dr. Wyonia Hough, MD PGY-2  Emergency Medicine    Tery Sanfilippo, MD 10/17/14 1124

## 2014-10-17 ENCOUNTER — Encounter: Payer: Self-pay | Admitting: Family Medicine

## 2014-10-17 DIAGNOSIS — D509 Iron deficiency anemia, unspecified: Secondary | ICD-10-CM

## 2014-10-17 DIAGNOSIS — R55 Syncope and collapse: Principal | ICD-10-CM

## 2014-10-17 LAB — POCT PREGNANCY, URINE: Preg Test, Ur: NEGATIVE

## 2014-10-17 LAB — URINALYSIS, ROUTINE W REFLEX MICROSCOPIC
Bilirubin Urine: NEGATIVE
Bilirubin Urine: NEGATIVE
Glucose, UA: NEGATIVE mg/dL
Glucose, UA: NEGATIVE mg/dL
HGB URINE DIPSTICK: NEGATIVE
Hgb urine dipstick: NEGATIVE
KETONES UR: NEGATIVE mg/dL
Ketones, ur: NEGATIVE mg/dL
LEUKOCYTES UA: NEGATIVE
Leukocytes, UA: NEGATIVE
NITRITE: NEGATIVE
NITRITE: NEGATIVE
PROTEIN: NEGATIVE mg/dL
Protein, ur: NEGATIVE mg/dL
SPECIFIC GRAVITY, URINE: 1.013 (ref 1.005–1.030)
Specific Gravity, Urine: 1.022 (ref 1.005–1.030)
UROBILINOGEN UA: 1 mg/dL (ref 0.0–1.0)
Urobilinogen, UA: 0.2 mg/dL (ref 0.0–1.0)
pH: 5 (ref 5.0–8.0)
pH: 7 (ref 5.0–8.0)

## 2014-10-17 LAB — TROPONIN I: Troponin I: <0.03 ng/mL (ref <0.031)

## 2014-10-17 MED ORDER — FERROUS SULFATE 325 (65 FE) MG PO TABS
325.0000 mg | ORAL_TABLET | Freq: Every day | ORAL | Status: DC
  Administered 2014-10-17 – 2014-10-18 (×2): 325 mg via ORAL
  Filled 2014-10-17 (×3): qty 1

## 2014-10-17 MED ORDER — SODIUM CHLORIDE 0.9 % IV SOLN
INTRAVENOUS | Status: AC
  Administered 2014-10-17: 08:00:00 via INTRAVENOUS

## 2014-10-17 MED ORDER — ACETAMINOPHEN 325 MG PO TABS
650.0000 mg | ORAL_TABLET | Freq: Four times a day (QID) | ORAL | Status: DC | PRN
  Administered 2014-10-17: 650 mg via ORAL
  Filled 2014-10-17: qty 2

## 2014-10-17 MED ORDER — FERROUS SULFATE 325 (65 FE) MG PO TABS: 325.0000 mg | ORAL_TABLET | Freq: Every day | ORAL | Status: DC

## 2014-10-17 NOTE — Progress Notes (Signed)
Subjective: No complainst today. No episodes since admission here.   Objective: Vital signs in last 24 hours: Filed Vitals:   10/16/14 2200 10/16/14 2205 10/17/14 0501 10/17/14 1326  BP: 116/72 117/75 95/62 106/60  Pulse: 80 80 89 85  Temp: 99 F (37.2 C) 99.7 F (37.6 C) 98.7 F (37.1 C) 98.3 F (36.8 C)  TempSrc: Oral Oral Oral Oral  Resp: 15 15 15    Height:  5' 3.6" (1.615 m)    Weight:  133 lb 13.1 oz (60.7 kg) 135 lb 5.8 oz (61.4 kg)   SpO2: 100% 100% 100% 100%   Weight change:   Intake/Output Summary (Last 24 hours) at 10/17/14 1412 Last data filed at 10/17/14 1200  Gross per 24 hour  Intake    120 ml  Output   2500 ml  Net  -2380 ml   General appearance: alert, cooperative, appears stated age and no distress Head: Normocephalic, without obvious abnormality, atraumatic Lungs: clear to auscultation bilaterally Heart: regular rate and rhythm, S1, S2 normal, no murmur, click, rub or gallop Abdomen: soft, non-tender; bowel sounds normal; no masses,  no organomegaly Extremities: extremities normal, atraumatic, no cyanosis or edema   Lab Results: Basic Metabolic Panel:  Recent Labs Lab 10/16/14 1729  NA 139  K 3.9  CL 110  CO2 23  GLUCOSE 91  BUN 9  CREATININE 0.71  CALCIUM 8.6*   CBC:  Recent Labs Lab 10/16/14 1729  WBC 6.0  HGB 10.5*  HCT 32.6*  MCV 86.0  PLT 183   Cardiac Enzymes:  Recent Labs Lab 10/17/14 0630  TROPONINI <0.03   CBG:  Recent Labs Lab 10/16/14 1601  GLUCAP 91   Urine Drug Screen: Drugs of Abuse     Component Value Date/Time   LABOPIA NONE DETECTED 09/03/2014 2223   COCAINSCRNUR NONE DETECTED 09/03/2014 2223   LABBENZ NONE DETECTED 09/03/2014 2223   AMPHETMU NONE DETECTED 09/03/2014 2223   THCU NONE DETECTED 09/03/2014 2223   LABBARB NONE DETECTED 09/03/2014 2223    Urinalysis:  Recent Labs Lab 10/16/14 1820  COLORURINE YELLOW  YELLOW  LABSPEC 1.022  1.013  PHURINE 5.0  7.0  GLUCOSEU NEGATIVE   NEGATIVE  HGBUR NEGATIVE  NEGATIVE  BILIRUBINUR NEGATIVE  NEGATIVE  KETONESUR NEGATIVE  NEGATIVE  PROTEINUR NEGATIVE  NEGATIVE  UROBILINOGEN 0.2  1.0  NITRITE NEGATIVE  NEGATIVE  LEUKOCYTESUR NEGATIVE  NEGATIVE   Studies/Results: Dg Cervical Spine 2-3 Views  10/16/2014   CLINICAL DATA:  Larey SeatFell and injured neck today.  EXAM: CERVICAL SPINE - 2-3 VIEW  COMPARISON:  09/24/2014.  FINDINGS: Examination performed in a cervical collar.  The cervical vertebral bodies are normally aligned. Disc spaces and vertebral bodies are maintained. No significant degenerative changes. No acute bony findings or abnormal prevertebral soft tissue swelling. The facets are normally aligned. The C1-2 articulations are maintained. The lung apices are clear.  IMPRESSION: Normal alignment and no acute bony findings.   Electronically Signed   By: Rudie MeyerP.  Gallerani M.D.   On: 10/16/2014 16:31   Medications: I have reviewed the patient's current medications. Scheduled Meds: . enoxaparin (LOVENOX) injection  40 mg Subcutaneous Q24H  . ferrous sulfate  325 mg Oral Q breakfast   Continuous Infusions:  PRN Meds:. Assessment/Plan:  Syncope- working etiology now- possibly related to WPW s/p ablation, POTs, and Vasovagal syncope- With positive tilt table testing. [facility] but she had her ablation in [place], and went to  [facility] for a second opinion. She is otherwise stable medically. - EP consult today, will follow and appreciate their recommendations.  Iron deficiency Anemia- Hgb- 10.5. Hx of menorrhagia. - Cont PO iron - OB/gyn follow up.  DVT ppx- Lovenox.  Dispo: Disposition is deferred at this time, awaiting cardiology recommendations.   The patient does have a current PCP Ambrose Finland, NP) and does need an [facility] hospital follow-up appointment after discharge.  The patient does not know have transportation limitations that hinder transportation to clinic  appointments.  .Services Needed at time of discharge: Y = Yes, Blank = No PT:   OT:   RN:   Equipment:   Other:     LOS: 1 day   Onnie Boer, MD 10/17/2014, 2:12 PM

## 2014-10-17 NOTE — Progress Notes (Signed)
Utilization review completed. Mansa Willers, RN, BSN. 

## 2014-10-17 NOTE — Consult Note (Signed)
ELECTROPHYSIOLOGY CONSULT NOTE    Patient ID: Desiree Gutierrez MRN: [id number], DOB/AGE: [birth year] 23 y.o.  Admit date: 10/16/2014 Date of Consult: 10/17/2014  Primary Physician: Ambrose Finland, NP Primary Cardiologist: Alfonso Ellis [facility])  Reason for Consultation: syncope  HPI:  Desiree Gutierrez is a 24 y.o. female with a past medical history significant for WPW(s/p left posterior pathway ablation in 2011 by Dr Gevena Cotton), vasovagal syncope with prior positive tilt, POTS, and palpitations (s/p ILR implant by Dr Cristi Loron at [facility] due to previous normal external monitors). According to Dr Rexene Agent note in [facility], she has failed medical therapy for vasovagal syncope/POTS with florinef, midodrine, inderal, and citalapram. She has had multiple ER visits for recurrent syncope. She was last evaluated by EP here when she presented on 05-29-14 with a typical syncopal spell while standing in line at the bank. During that admission, she had a typical spell while on telemetry which demonstrated ST with rates into the 140's. CBG at the time was 65. She was sitting up at the time of the episode.  We added Florinef at that time, encouraged fluid and salt intake and follow up with Drs Alfonso Ellis and Read Drivers.   She has had multiple ER visits since February with recurrent syncope. The day of admission on this occurrence, she was walking across the floor and passed out and EMS was called.   She states that she has seen Dr Erenest Rasher at [facility] as well. She was intolerant of Midodrine due to migraines. She does not remember response to Florinef (looking at notes, it was tried in 2013). She states that she was taking salt tablets but has run out. She is drinking water and gatorade and her urine is clear. She does not use support hose (Dr Alfonso Ellis recommended waist high).   She feels like she needs to come to the ER because of syncope and the fact that she has little prodrome and usually falls with  her episodes.   ILR interrogation demonstrated 1 undersensing episode that was labeled a pause but not true.  1 symptom episode on 7/7 - sinus tachycardia.    EPS at time of WPW ablation in 2011 demonstrated dual AV nodal physiology, but the patient has not had documented SVT.   Echocardiogram 3/16 demonstrated EF 60-65%, no RWMA, no valvular abnormalities.   Lab work this admission is reviewed. Orthostatics this admission are normal.   She currently denies chest pain, shortness of breath, dizziness, palpitations. She states that her syncopal spells have a short prodrome and she is unable to lie down quickly enough to avoid passing out.    Past Medical History  Diagnosis Date  . WPW (Wolff-Parkinson-White syndrome)     a. s/p ablation (2011) Dr Gevena Cotton at [facility] (posterior lateral pathway)  . Vasovagal syncope     a. positive tilt 2011 b. failed medical therapy with Florinef, Midodrine, Inderal, Celexa  . POTS (postural orthostatic tachycardia syndrome)   . Palpitations     a. s/p MDT ILR implanted by Dr Alfonso Ellis [facility])  . Headache   . Syncope     recurrent, daily     Surgical History:  Past Surgical History  Procedure Laterality Date  . Cardiac electrophysiology study and ablation  2011    Dr. Gevena Cotton at [facility]- left posterior pathway ablation for WPW  . Tilt table study  2011    neurally mediated syncope  . Loop recorder implant  01/22/2014    MDT LINQ implanted by Dr Alfonso Ellis at [facility] for evaluation  of palpitations/syncope     No prescriptions prior to admission    Inpatient Medications:  . enoxaparin (LOVENOX) injection  40 mg Subcutaneous Q24H  . ferrous sulfate  325 mg Oral Q breakfast    Allergies:  Allergies  Allergen Reactions  . Amitiza [Lubiprostone] Anaphylaxis, Shortness Of Breath and Swelling  . Ketorolac Palpitations  . Metoclopramide Other (See Comments)    Heart raced  . Midodrine Other (See Comments)    migraines  . Fludrocortisone Rash     History   Social History  . Marital Status: Single    Spouse Name: N/A  . Number of Children: N/A  . Years of Education: N/A   Occupational History  . Not on file.   Social History Main Topics  . Smoking status: Never Smoker   . Smokeless tobacco: Not on file  . Alcohol Use: No  . Drug Use: No  . Sexual Activity: Not on file   Other Topics Concern  . Not on file   Social History Narrative     Family History  Problem Relation Age of Onset  . Diabetes Mother   . Hypertension Mother   . Stroke Mother   . Hypertension Brother      Review of Systems: All other systems reviewed and are otherwise negative except as noted above.  Physical Exam: Filed Vitals:   10/16/14 2143 10/16/14 2200 10/16/14 2205 10/17/14 0501  BP: 118/64 116/72 117/75 95/62  Pulse: 83 80 80 89  Temp:  99 F (37.2 C) 99.7 F (37.6 C) 98.7 F (37.1 C)  TempSrc:  Oral Oral Oral  Resp: 11 15 15 15   Height:   5' 3.6" (1.615 m)   Weight:   133 lb 13.1 oz (60.7 kg) 135 lb 5.8 oz (61.4 kg)  SpO2: 100% 100% 100% 100%    GEN- The patient is well appearing, alert and oriented x 3 today.   HEENT: normocephalic, atraumatic; sclera clear, conjunctiva pink; hearing intact; oropharynx clear; neck supple, no JVP Lymph- no cervical lymphadenopathy Lungs- Clear to ausculation bilaterally, normal work of breathing.  No wheezes, rales, rhonchi Heart- Regular rate and rhythm, no murmurs, rubs or gallops  GI- soft, non-tender, non-distended, bowel sounds present  Extremities- no clubbing, cyanosis, or edema  MS- no significant deformity or atrophy Skin- warm and dry, no rash or lesion Psych- euthymic mood, full affect Neuro- strength and sensation are intact  Labs:   Lab Results  Component Value Date   WBC 6.0 10/16/2014   HGB 10.5* 10/16/2014   HCT 32.6* 10/16/2014   MCV 86.0 10/16/2014   PLT 183 10/16/2014    Recent Labs Lab 10/16/14 1729  NA 139  K 3.9  CL 110  CO2 23  BUN 9   CREATININE 0.71  CALCIUM 8.6*  GLUCOSE 91      Radiology/Studies: Dg Chest 2 View 10/02/2014   CLINICAL DATA:  Chest discomfort, shortness of breath for 2 hours  EXAM: CHEST  2 VIEW  COMPARISON:  09/03/2014  FINDINGS: The heart size and mediastinal contours are within normal limits. Both lungs are clear. The visualized skeletal structures are unremarkable.  IMPRESSION: No active cardiopulmonary disease.   Electronically Signed   By: Natasha MeadLiviu  Pop M.D.   On: 10/02/2014 16:50    ZOX:WRUEAEKG:sinus rhythm, rate 97, normal intervals  TELEMETRY: sinus rhythm  Assessment/Plan: 1. Dysautomonia/vasovagal syncope/POTS Florinef was not tolerated 2/2 rash Encouraged adequate PO hydration and increased salt intake ILR interrogation demonstrates sinus tachycardia with no SVT  noted Waist high support hose as ordered - she has not been compliant Follow-up with Dr Beaty/Frazier-Mills as scheduled No driving X6 months from last syncopal event  Dr Graciela Husbands to see today.   She should follow up with her EP's that know her well in [place] and [place] as an outpatient.   Signed, Gypsy Balsam, NP 10/17/2014 10:38 AM   EP Attending  Patient seen and examined. A challenging situation. I have reviewed the findings as noted by Gypsy Balsam, NP-C. These reflect my findings as well. The patient has a very difficult case of autonomic dysfunction. She has 2 outstanding EP MD's following her care. She is ok for discharge. We have nothing else to add. She has no evidence of sinus bradycardia or heart block. (patient's with autonomic dysfunction will often have cardiac inhibition and profound bradycardia, and if so will respond but not be cured by pacing.) Please call for questions. I cannot emphasize enough the importance of a very high sodium diet.  Leonia Reeves.D.

## 2014-10-17 NOTE — Progress Notes (Signed)
No syncopal episode throughout the night.

## 2014-10-17 NOTE — Discharge Instructions (Signed)
1) Maintain adequate PO hydration and increase salt intake.  2) Use waist high support hose.  3) Follow-up with Dr Alfonso EllisBeaty [facility] and Dr. Read DriversFrazier-Mills ([facility]) as scheduled.  4) Do not drive for at least 6 months from your last episode of fainting.

## 2014-10-17 NOTE — Progress Notes (Signed)
Subjective: Patient was feeling well this morning on rounds. She denied any presyncopal or syncopal episodes overnight. She denied any acute complaints.   Objective: Vital signs in last 24 hours: Filed Vitals:   10/16/14 2200 10/16/14 2205 10/17/14 0501 10/17/14 1326  BP: 116/72 117/75 95/62 106/60  Pulse: 80 80 89 85  Temp: 99 F (37.2 C) 99.7 F (37.6 C) 98.7 F (37.1 C) 98.3 F (36.8 C)  TempSrc: Oral Oral Oral Oral  Resp: 15 15 15    Height:  5' 3.6" (1.615 m)    Weight:  60.7 kg (133 lb 13.1 oz) 61.4 kg (135 lb 5.8 oz)   SpO2: 100% 100% 100% 100%   Weight change:   Intake/Output Summary (Last 24 hours) at 10/17/14 1338 Last data filed at 10/17/14 1200  Gross per 24 hour  Intake    120 ml  Output   2500 ml  Net  -2380 ml   General Appearance:  Alert, cooperative, no distress, appears stated age Head:  [place]/AT Cardiovascular: RRR, no murmurs, intact distal pulses Pulmonary: CTAB, unlabored breathing Abdominal: Soft, +BS, NT/ND  Labs: Basic Metabolic Panel:  Recent Labs Lab 10/16/14 1729  NA 139  K 3.9  CL 110  CO2 23  GLUCOSE 91  BUN 9  CREATININE 0.71  CALCIUM 8.6*   CBC:  Recent Labs Lab 10/16/14 1729  WBC 6.0  HGB 10.5*  HCT 32.6*  MCV 86.0  PLT 183   Cardiac Enzymes:  Recent Labs Lab 10/17/14 0630  TROPONINI <0.03   CBG:  Recent Labs Lab 10/16/14 1601  GLUCAP 91   Urine Drug Screen: Drugs of Abuse     Component Value Date/Time   LABOPIA NONE DETECTED 09/03/2014 2223   COCAINSCRNUR NONE DETECTED 09/03/2014 2223   LABBENZ NONE DETECTED 09/03/2014 2223   AMPHETMU NONE DETECTED 09/03/2014 2223   THCU NONE DETECTED 09/03/2014 2223   LABBARB NONE DETECTED 09/03/2014 2223    Urinalysis:  Recent Labs Lab 10/16/14 1820  COLORURINE YELLOW  YELLOW  LABSPEC 1.022  1.013  PHURINE 5.0  7.0  GLUCOSEU NEGATIVE  NEGATIVE  HGBUR NEGATIVE  NEGATIVE  BILIRUBINUR NEGATIVE  NEGATIVE  KETONESUR NEGATIVE  NEGATIVE  PROTEINUR  NEGATIVE  NEGATIVE  UROBILINOGEN 0.2  1.0  NITRITE NEGATIVE  NEGATIVE  LEUKOCYTESUR NEGATIVE  NEGATIVE   Urine pregnancy test:  negative  BNP: 7.8  Imaging: Dg Cervical Spine 2-3 Views  10/16/2014   CLINICAL DATA:  Larey SeatFell and injured neck today.  EXAM: CERVICAL SPINE - 2-3 VIEW  COMPARISON:  09/24/2014.  FINDINGS: Examination performed in a cervical collar.  The cervical vertebral bodies are normally aligned. Disc spaces and vertebral bodies are maintained. No significant degenerative changes. No acute bony findings or abnormal prevertebral soft tissue swelling. The facets are normally aligned. The C1-2 articulations are maintained. The lung apices are clear.  IMPRESSION: Normal alignment and no acute bony findings.   Electronically Signed   By: Rudie MeyerP.  Gallerani M.D.   On: 10/16/2014 16:31   Medications:  Scheduled Medications: . enoxaparin (LOVENOX) injection  40 mg Subcutaneous Q24H  . ferrous sulfate  325 mg Oral Q breakfast     Assessment/Plan: Principal Problem:   Syncope Active Problems:   WOLFF-PARKINSON-WHITE (WPW) SYNDROME status post ablation   POTS (postural orthostatic tachycardia syndrome)   Iron deficiency anemia   Fall  Ms. 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  Electrophysiology team consulted this morning. We appreciate their recommends regarding need for any further work-up or evaluation.  Otherwise, patient has already established care with [facility]ologists at [facility] and [facility], with appointments at both. [facility] cardiologist is Dr. Read Drivers [phone], appt on 10/28/14) and [facility] cardiologist is Dr. Alfonso Ellis [phone]) appt on 12/18/14. - Electrophysiology team consulted; appreciate recs - Continue  tele  Iron deficiency anemia:  Hgb 10.5 (baseline 10-11). Last iron panel in March. She has a history of menorrhagia. She is not taking PO iron and has not followed up with OB/Gyn. - Continue PO iron - Patient will need outpatient OB/Gyn f/u  FEN/GI/PPx - Diet = Regular - Fluids = Discontinue IVF this morning - DVT = Lovenox  Code Status - Full code   Dispostion:  Discharge to home, likely today, pending evaluation by EP team.   Length of Stay: 1 day(s)  This is a Psychologist, occupational Note.  The care of the patient was discussed with Dr. Mariea Clonts and the assessment and plan formulated with their assistance.  Please see their attached note for official documentation of the daily encounter.   LOS: 1 day   Maryjean Morn, Med Student 10/17/2014, 1:38 PM

## 2014-10-18 NOTE — Progress Notes (Signed)
Desiree Gutierrez to be D/C'd Home per MD order.  Discussed with the patient and all questions fully answered.  VSS, Skin clean, dry and intact without evidence of skin break down, no evidence of skin tears noted. IV catheter discontinued intact. Site without signs and symptoms of complications. Dressing and pressure applied.  An After Visit Summary was printed and given to the patient.   D/c education completed with patient/family including follow up instructions, medication list, d/c activities limitations if indicated, with other d/c instructions as indicated by MD - patient able to verbalize understanding, all questions fully answered.   Patient instructed to return to ED, call 911, or call MD for any changes in condition.   Patient escorted via WC, and D/C home via private auto.  Beckey DowningFlores, Bobbette Eakes F 10/18/2014 11:06 AM

## 2014-10-18 NOTE — Discharge Summary (Signed)
Name: Desiree Gutierrez MRN: [id number] DOB: 1990/09/25 24 y.o. PCP: [name], MD  Date of Admission: 10/16/2014  3:28 PM Date of Discharge: 10/18/2014 Attending Physician: Doneen Poisson, MD  Discharge Diagnosis:  Principal Problem:   Syncope Active Problems:   WOLFF-PARKINSON-WHITE (WPW) SYNDROME status post ablation   POTS (postural orthostatic tachycardia syndrome)   Iron deficiency anemia   Fall  Discharge Medications:   Medication List    TAKE these medications        ferrous sulfate 325 (65 FE) MG tablet  Take 1 tablet (325 mg total) by mouth daily with breakfast.        Disposition and follow-up:   24 was discharged from [facility] in Good condition.  At the hospital follow up visit please address:  1.  Compliance with high salt diet, waist high compression stockings,taking Iron tablets.  2.  Labs / imaging needed at time of follow-up: none  3.  Pending labs/ test needing follow-up: none  Follow-up Appointments:     Follow-up Information    Follow up with Read Drivers, Valarie Merino, MD On 10/28/2014.   Specialty:  Cardiology   Contact information:   [address] [place] [address] [phone]       Follow up with Maryln Manuel, MD On 12/18/2014.   Specialty:  Cardiology   Contact information:   [address] [place] [address] [phone]       Discharge Instructions: Discharge Instructions    Diet - low sodium heart healthy    Complete by:  As directed      Discharge instructions    Complete by:  As directed   Please follow up with your Cardiologist for this problem as soon as you can.  Has seen [facility]ologists at [facility] and also at [facility], with appointments at both. [facility] cardiologist is Dr. Read Drivers [phone], appt on 10/28/14) and [facility] cardiologist is Dr. Alfonso Ellis [phone]) appt on 12/18/14.     Increase activity slowly    Complete by:  As directed             Consultations:  Electrophysiology  Procedures Performed:  Dg Chest 2 View  10/02/2014   CLINICAL DATA:  Chest discomfort, shortness of breath for 2 hours  EXAM: CHEST  2 VIEW  COMPARISON:  09/03/2014  FINDINGS: The heart size and mediastinal contours are within normal limits. Both lungs are clear. The visualized skeletal structures are unremarkable.  IMPRESSION: No active cardiopulmonary disease.   Electronically Signed   By: Natasha Mead M.D.   On: 10/02/2014 16:50   Dg Cervical Spine 2-3 Views  10/16/2014   CLINICAL DATA:  Larey Seat and injured neck today.  EXAM: CERVICAL SPINE - 2-3 VIEW  COMPARISON:  09/24/2014.  FINDINGS: Examination performed in a cervical collar.  The cervical vertebral bodies are normally aligned. Disc spaces and vertebral bodies are maintained. No significant degenerative changes. No acute bony findings or abnormal prevertebral soft tissue swelling. The facets are normally aligned. The C1-2 articulations are maintained. The lung apices are clear.  IMPRESSION: Normal alignment and no acute bony findings.   Electronically Signed   By: Rudie Meyer M.D.   On: 10/16/2014 16:31   Dg Cervical Spine Complete  09/24/2014   CLINICAL DATA:  Near syncope.  Fall today.  Neck pain  EXAM: CERVICAL SPINE  4+ VIEWS  COMPARISON:  09/07/2014  FINDINGS: There is no evidence of cervical spine fracture or prevertebral soft tissue swelling.  Alignment is normal. No other significant bone abnormalities are identified.  IMPRESSION: Negative cervical spine radiographs.   Electronically Signed   By: Marlan Palauharles  Clark M.D.   On: 09/24/2014 16:41    2D Echo: None  Cardiac Cath: none  Admission GNF:AOZHYHPI:Chief Complaint: syncope  History of Present Illness: [name] is a 24 yo female with WPW s/p ablation (2011), POTS, and recurrent syncope, presenting with an episode of syncope. She has been having increasingly frequent presyncope/syncope episodes since January, necessitating multiple trips to the ED and  an admission in March. She is now having nearly daily episodes, described as heart racing, and then she becomes sweaty, dizzy, and lightheaded. She reports multiple instances of loss of consciousness, usually lasting less than 5 minutes. She denies loss of bowel or bladder control or tongue biting. She endorses sometimes needing a few minutes to start thinking clearly again. She has no history of seizure. These episodes occur at all times of the day and in all situations, including waking her from sleep.   She has an implantable loop recorder, set to record for HR>200. It was not interrogated in the ED. No tachycardic episodes above threshold have been noted on previous interrogations. [facility]. She has not been seen since March, and her next appointment is not scheduled until September. In her previous workup, tilt table test was positive for vasovagal syncope. She was also referred to Neurology for evaluation of neurogenic syncope. She reports she needs to reschedule the appointment. Echo in March 2016 was unremarkable. She endorses drinking lots of fluids, but denies adding salt to foods.  She also reports that her legs (knees to feet) and arms (elbows to hands) will turn blue. It can be painful when it occurs. It only lasts about 5 minutes. It does not only occur in cold settings. She has no history of rheumatologic disease.   She denies HA, vision changes, chest pain, SOB, bowel or bladder changes, weakness, or numbness.   Physical Exam: Blood pressure 117/75, pulse 80, temperature 99.7 F (37.6 C), temperature source Oral, resp. rate 15, height 5' 3.6" (1.615 m), weight 133 lb 13.1 oz (60.7 kg), last menstrual period 09/19/2014, SpO2 100 %. Physical Exam  Constitutional: She is oriented to person, place, and time. She appears well-developed and well-nourished. No distress.  HENT:  Head: Normocephalic and atraumatic.  Eyes: EOM are normal.  Pupils are equal, round, and reactive to light.  Neck: Normal range of motion. No tracheal deviation present.  Cardiovascular: Normal rate, regular rhythm, normal heart sounds and intact distal pulses.  Pulmonary/Chest: Effort normal and breath sounds normal. No respiratory distress. She has no wheezes.  Abdominal: Soft. Bowel sounds are normal. She exhibits no distension. There is no tenderness. There is no rebound and no guarding.  Musculoskeletal: Normal range of motion. She exhibits no edema.  Neurological: She is alert and oriented to person, place, and time.  Skin: Skin is warm and dry. No rash noted. She is not diaphoretic.   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 Patient has had multiple ER visits for recurrent syncope. Patient was not orthostatic, but she was s/p IVF by EMS. Patient was hydrated with IV saline and placed on telemetry. CXR showed no active disease. EKG was unremarkable. BNP was 7.8. Pregnancy test was negative. UA was unremarkable. Her last echocardiogram (3/16) demonstrated  EF 60-65%, no Regional wall motion abnormalities, and no valvular abnormalities. EPS at time of WPW ablation in 2011 demonstrated dual AV nodal physiology, but the patient has not had documented SVT. The electrophysiology team was consulted during her admission given the complexity of her presenting issue. ILR interrogation demonstrated sinus tachycardia (1 symptomatic episode on 7/7 ) with no SVT. They recommended that she follow-up as scheduled with Drs. Beaty and Read Drivers, as they know her well. They advised her to use waist high support hose, to maintain adequate PO hydration, to increase her salt intake, and to abstain from driving 6 months from her last syncopal event. This was emphasized on discharge.  2. Iron deficiency anemia. Patient's  hemoglobin on admission was 10.5 (baseline 10-11). She has a history of menorrhagia. Last iron panel in 06/2014: Iron 41, TIBC 376, Iron sat 11, ferritin 12. She is not taking PO iron and has not followed up with OB/Gyn. Patient was provided PO iron during hospitalization, and prescribed on discharge. Pt to follow up with OB/Gyn.   Discharge Vitals:   BP 95/54 mmHg  Pulse 88  Temp(Src) 98.4 F (36.9 C) (Oral)  Resp 18  Ht 5' 3.6" (1.615 m)  Wt 137 lb 14.4 oz (62.551 kg)  BMI 23.98 kg/m2  SpO2 100%  LMP 09/19/2014  Signed: Onnie Boer, MD 10/18/2014, 10:13 AM    Services Ordered on Discharge: None Equipment Ordered on Discharge: None.

## 2014-10-18 NOTE — Progress Notes (Signed)
Subjective: Ready to go home. She has not had any syncopal episodes since admission.   Objective: Vital signs in last 24 hours: Filed Vitals:   10/17/14 1326 10/17/14 2211 10/17/14 2240 10/18/14 0634  BP: 106/60 109/63 102/60 95/54  Pulse: 85 89 97 88  Temp: 98.3 F (36.8 C) 99 F (37.2 C) 99 F (37.2 C) 98.4 F (36.9 C)  TempSrc: Oral Oral Oral Oral  Resp: Height:      Weight:    137 lb 14.4 oz (62.551 kg)  SpO2: 100% 100% 100% 100%   Weight change: 4 lb 1.3 oz (1.851 kg)  Intake/Output Summary (Last 24 hours) at 10/18/14 1005 Last data filed at 10/18/14 0944  Gross per 24 hour  Intake    578 ml  Output   3475 ml  Net  -2897 ml   General appearance: alert, cooperative, appears stated age and no distress, lying in bed Head: Normocephalic, without obvious abnormality, atraumatic. Lungs: clear to auscultation bilaterally Heart: regular rate and rhythm, S1, S2 no added sounds Abdomen: soft, non-tender; bowel sounds normal; no masses,  no organomegaly Extremities: extremities normal, atraumatic, no cyanosis, no pedal edema   Lab Results: Basic Metabolic Panel:  Recent Labs Lab 10/16/14 1729  NA 139  K 3.9  CL 110  CO2 23  GLUCOSE 91  BUN 9  CREATININE 0.71  CALCIUM 8.6*   CBC:  Recent Labs Lab 10/16/14 1729  WBC 6.0  HGB 10.5*  HCT 32.6*  MCV 86.0  PLT 183   Cardiac Enzymes:  Recent Labs Lab 10/17/14 0630  TROPONINI <0.03   CBG:  Recent Labs Lab 10/16/14 1601  GLUCAP 91   Urine Drug Screen: Drugs of Abuse     Component Value Date/Time   LABOPIA NONE DETECTED 09/03/2014 2223   COCAINSCRNUR NONE DETECTED 09/03/2014 2223   LABBENZ NONE DETECTED 09/03/2014 2223   AMPHETMU NONE DETECTED 09/03/2014 2223   THCU NONE DETECTED 09/03/2014 2223   LABBARB NONE DETECTED 09/03/2014 2223    Urinalysis:  Recent Labs Lab 10/16/14 1820  COLORURINE YELLOW  YELLOW  LABSPEC 1.022  1.013  PHURINE 5.0  7.0  GLUCOSEU NEGATIVE   NEGATIVE  HGBUR NEGATIVE  NEGATIVE  BILIRUBINUR NEGATIVE  NEGATIVE  KETONESUR NEGATIVE  NEGATIVE  PROTEINUR NEGATIVE  NEGATIVE  UROBILINOGEN 0.2  1.0  NITRITE NEGATIVE  NEGATIVE  LEUKOCYTESUR NEGATIVE  NEGATIVE   Studies/Results: Dg Cervical Spine 2-3 Views  10/16/2014   CLINICAL DATA:  Larey Seat and injured neck today.  EXAM: CERVICAL SPINE - 2-3 VIEW  COMPARISON:  09/24/2014.  FINDINGS: Examination performed in a cervical collar.  The cervical vertebral bodies are normally aligned. Disc spaces and vertebral bodies are maintained. No significant degenerative changes. No acute bony findings or abnormal prevertebral soft tissue swelling. The facets are normally aligned. The C1-2 articulations are maintained. The lung apices are clear.  IMPRESSION: Normal alignment and no acute bony findings.   Electronically Signed   By: Rudie Meyer M.D.   On: 10/16/2014 16:31   Medications: I have reviewed the patient's current medications. Scheduled Meds: . enoxaparin (LOVENOX) injection  40 mg Subcutaneous Q24H  . ferrous sulfate  325 mg Oral Q breakfast   Continuous Infusions:  PRN Meds:. Assessment/Plan:  Syncope- None since admission. Possibly related to WPW s/p ablation, POTs, and Vasovagal syncope- With positive tilt table testing.  - EP recs appreciated, have signed off, impression- Autonomic dysfunction, recommend, high salt diet, waist high support hose,  increase PO intake, and no driving for 6 months. Pt to follow up with her cardiologist.   Iron deficiency Anemia- Hgb- 10.5. Hx of menorrhagia. - Cont PO iron - OB/gyn follow up.  DVT ppx- Lovenox.  Dispo: Discharge home today.  The patient does have a current PCP (Dessa PhiJosalyn Funches, MD) and does [facility] after discharge.  The patient does not know have transportation limitations that hinder transportation to clinic appointments.  .Services Needed at time of discharge: Y = Yes, Blank = No PT:   OT:    RN:   Equipment:   Other:     LOS: 2 days   Onnie BoerEjiroghene E Sherina Stammer, MD 10/18/2014, 10:05 AM

## 2014-10-24 ENCOUNTER — Ambulatory Visit: Payer: Self-pay | Admitting: Internal Medicine

## 2014-10-28 ENCOUNTER — Telehealth: Payer: Self-pay | Admitting: Family Medicine

## 2014-10-28 ENCOUNTER — Emergency Department (HOSPITAL_COMMUNITY)
Admission: EM | Admit: 2014-10-28 | Discharge: 2014-10-28 | Disposition: A | Discharge status: Home / Self Care | Payer: Medicaid Other | Attending: Emergency Medicine | Admitting: Emergency Medicine

## 2014-10-28 ENCOUNTER — Ambulatory Visit: Payer: Medicaid Other | Attending: Internal Medicine | Admitting: Family Medicine

## 2014-10-28 ENCOUNTER — Encounter (HOSPITAL_COMMUNITY): Payer: Self-pay

## 2014-10-28 ENCOUNTER — Emergency Department (HOSPITAL_COMMUNITY): Payer: Medicaid Other

## 2014-10-28 ENCOUNTER — Encounter: Payer: Self-pay | Admitting: Family Medicine

## 2014-10-28 VITALS — BP 120/76 | HR 76 | Temp 98.9°F | Resp 16 | Ht 63.0 in | Wt 137.0 lb

## 2014-10-28 DIAGNOSIS — W1839XA Other fall on same level, initial encounter: Secondary | ICD-10-CM | POA: Diagnosis not present

## 2014-10-28 DIAGNOSIS — W19XXXA Unspecified fall, initial encounter: Secondary | ICD-10-CM

## 2014-10-28 DIAGNOSIS — M79606 Pain in leg, unspecified: Secondary | ICD-10-CM | POA: Diagnosis not present

## 2014-10-28 DIAGNOSIS — R51 Headache: Secondary | ICD-10-CM | POA: Insufficient documentation

## 2014-10-28 DIAGNOSIS — R002 Palpitations: Secondary | ICD-10-CM | POA: Insufficient documentation

## 2014-10-28 DIAGNOSIS — I456 Pre-excitation syndrome: Secondary | ICD-10-CM | POA: Diagnosis not present

## 2014-10-28 DIAGNOSIS — Z3202 Encounter for pregnancy test, result negative: Secondary | ICD-10-CM | POA: Insufficient documentation

## 2014-10-28 DIAGNOSIS — Z79899 Other long term (current) drug therapy: Secondary | ICD-10-CM | POA: Diagnosis not present

## 2014-10-28 DIAGNOSIS — R000 Tachycardia, unspecified: Secondary | ICD-10-CM | POA: Insufficient documentation

## 2014-10-28 DIAGNOSIS — Y92531 Health care provider office as the place of occurrence of the external cause: Secondary | ICD-10-CM | POA: Diagnosis not present

## 2014-10-28 DIAGNOSIS — R42 Dizziness and giddiness: Secondary | ICD-10-CM | POA: Diagnosis not present

## 2014-10-28 DIAGNOSIS — G90A Postural orthostatic tachycardia syndrome (POTS): Secondary | ICD-10-CM

## 2014-10-28 DIAGNOSIS — E162 Hypoglycemia, unspecified: Secondary | ICD-10-CM

## 2014-10-28 DIAGNOSIS — D509 Iron deficiency anemia, unspecified: Secondary | ICD-10-CM

## 2014-10-28 DIAGNOSIS — R55 Syncope and collapse: Secondary | ICD-10-CM | POA: Diagnosis not present

## 2014-10-28 DIAGNOSIS — I951 Orthostatic hypotension: Secondary | ICD-10-CM | POA: Diagnosis not present

## 2014-10-28 DIAGNOSIS — Z114 Encounter for screening for human immunodeficiency virus [HIV]: Secondary | ICD-10-CM

## 2014-10-28 LAB — URINALYSIS, ROUTINE W REFLEX MICROSCOPIC
Bilirubin Urine: NEGATIVE
GLUCOSE, UA: NEGATIVE mg/dL
Hgb urine dipstick: NEGATIVE
KETONES UR: NEGATIVE mg/dL
LEUKOCYTES UA: NEGATIVE
Nitrite: NEGATIVE
Protein, ur: NEGATIVE mg/dL
Specific Gravity, Urine: 1.007 (ref 1.005–1.030)
Urobilinogen, UA: 0.2 mg/dL (ref 0.0–1.0)
pH: 6 (ref 5.0–8.0)

## 2014-10-28 LAB — CBG MONITORING, ED
GLUCOSE-CAPILLARY: 79 mg/dL (ref 65–99)
Glucose-Capillary: 105 mg/dL — ABNORMAL HIGH (ref 65–99)
Glucose-Capillary: 55 mg/dL — ABNORMAL LOW (ref 65–99)
Glucose-Capillary: 109 mg/dL — ABNORMAL HIGH (ref 65–99)

## 2014-10-28 LAB — POCT CBG (FASTING - GLUCOSE)-MANUAL ENTRY: Glucose Fasting, POC: 48 mg/dL — AB (ref 70–99)

## 2014-10-28 LAB — POC URINE PREG, ED: Preg Test, Ur: NEGATIVE

## 2014-10-28 MED ORDER — TRUEPLUS LANCETS 28G MISC: 1.0000 | Freq: Three times a day (TID) | Status: DC

## 2014-10-28 MED ORDER — TRUE METRIX METER W/DEVICE KIT: 1.0000 | PACK | Status: DC | PRN

## 2014-10-28 MED ORDER — GLUCOSE BLOOD VI STRP: 1.0000 | ORAL_STRIP | Freq: Three times a day (TID) | Status: DC

## 2014-10-28 MED ORDER — GLUCOSE 40 % PO GEL
1.0000 | Freq: Once | ORAL | Status: AC
  Administered 2014-10-28: 37.5 g via ORAL

## 2014-10-28 NOTE — Patient Instructions (Addendum)
Ms. Persico,  Thank you for coming in today  1. Syncope, Wolf-Parkinson-White Syndrome Dr. Algie Coffer does accept the orange card with a $10 co-pay per visit There are some availabilities this week and next week I have placed a referral You may call for appt [phone]  2. Blood type ordered   3. Healthcare maintenance: Screening HIV done today Pap smear done at next visit   F/u in 3-4 weeks for pap smear  Dr. Armen Pickup

## 2014-10-28 NOTE — Telephone Encounter (Signed)
Lafonda Mosses is calling to clarify orders before accepting. Please follow up with Lafonda Mosses. Thank you.

## 2014-10-28 NOTE — ED Notes (Signed)
Pt. BS found to be 55, Lenell Antu MD notified. Pt. Given orange juice, [place] sandwich, and applesauce. Will repeat CBG following meal.

## 2014-10-28 NOTE — Assessment & Plan Note (Signed)
A: hypoglycemia related to syncopal episode today. Patient has been transferred to ED P: CBG monitoring for home: meter ordered Patient will need to eat every 3 hrs to avoid hypoglycemia in the feature A1c at next office visit

## 2014-10-28 NOTE — Progress Notes (Signed)
Establish Care with PCP ER F/U due to syncope  No Hx tobacco

## 2014-10-28 NOTE — Telephone Encounter (Signed)
Called patient Requested patient call back for follow up instructions  When she call please tell her the following  A: hypoglycemia related to syncopal episode today. Patient has been transferred to ED P: CBG monitoring for home: meter ordered Patient will need to eat every 3 hrs to avoid hypoglycemia in the feature A1c at next office visit

## 2014-10-28 NOTE — ED Provider Notes (Signed)
CSN: [id number]     Arrival date & time 10/28/14  1326 History   None    Chief Complaint  Patient presents with  . Hypoglycemia  . Loss of Consciousness   (Consider location/radiation/quality/duration/timing/severity/associated sxs/prior Treatment) Patient is a 24 y.o. female presenting with syncope. The history is provided by the patient. No language interpreter was used.  Loss of Consciousness Episode history:  Single Most recent episode:  Today Progression:  Resolved Chronicity:  Recurrent Context: normal activity   Witnessed: yes   Relieved by:  Nothing Worsened by:  Nothing tried Ineffective treatments:  None tried Associated symptoms: nausea and palpitations   Associated symptoms: no anxiety, no chest pain, no confusion, no diaphoresis, no dizziness, no fever, no focal weakness, no headaches, no shortness of breath, no vomiting and no weakness   Risk factors comment:  Hx of POTS   Past Medical History  Diagnosis Date  . WPW (Wolff-Parkinson-White syndrome)     a. s/p ablation (2011) Dr Thomasenia Sales at [facility] (posterior lateral pathway)  . Vasovagal syncope     a. positive tilt 2011 b. failed medical therapy with Florinef, Midodrine, Inderal, Celexa  . POTS (postural orthostatic tachycardia syndrome)   . Palpitations     a. s/p MDT ILR implanted by Dr Gelene Mink [facility])  . Headache   . Syncope     recurrent, daily   Past Surgical History  Procedure Laterality Date  . Cardiac electrophysiology study and ablation  2011    Dr. Thomasenia Sales at [facility]- left posterior pathway ablation for WPW  . Tilt table study  2011    neurally mediated syncope  . Loop recorder implant  01/22/2014    MDT LINQ implanted by Dr Gelene Mink at [facility] for evaluation of palpitations/syncope   Family History  Problem Relation Age of Onset  . Diabetes Mother   . Hypertension Mother   . Stroke Mother   . Hypertension Brother    History  Substance Use Topics  . Smoking status: Never Smoker   . Smokeless  tobacco: Not on file  . Alcohol Use: No   OB History    No data available     Review of Systems  Constitutional: Negative for fever, diaphoresis and fatigue.  Respiratory: Negative for cough, chest tightness and shortness of breath.   Cardiovascular: Positive for palpitations and syncope. Negative for chest pain.  Gastrointestinal: Positive for nausea. Negative for vomiting, abdominal pain and blood in stool.  Musculoskeletal: Negative for back pain and neck pain.  Neurological: Positive for syncope and light-headedness. Negative for dizziness, focal weakness, weakness and headaches.  Psychiatric/Behavioral: Negative for confusion.  All other systems reviewed and are negative.     Allergies  Amitiza; Ketorolac; Metoclopramide; Midodrine; and Fludrocortisone  Home Medications   Prior to Admission medications   Medication Sig Start Date End Date Taking? Authorizing Provider  ferrous sulfate 325 (65 FE) MG tablet Take 1 tablet (325 mg total) by mouth daily with breakfast. 10/17/14  Yes Ejiroghene E Emokpae, MD  Blood Glucose Monitoring Suppl (TRUE METRIX METER) W/DEVICE KIT 1 each by Does not apply route as needed. 10/28/14   Josalyn Funches, MD  glucose blood (TRUE METRIX BLOOD GLUCOSE TEST) test strip 1 each by Other route 3 (three) times daily. 10/28/14   Boykin Nearing, MD  TRUEPLUS LANCETS 28G MISC 1 each by Does not apply route 3 (three) times daily. 10/28/14   Josalyn Funches, MD   BP 108/75 mmHg  Pulse 73  Resp 15  SpO2 100%  LMP 10/24/2014 Physical Exam  Constitutional: She is oriented to person, place, and time. She appears well-developed and well-nourished. No distress.  HENT:  Head: Normocephalic.  Nose: Nose normal.  Mouth/Throat: Oropharynx is clear and moist. No oropharyngeal exudate.  Mild bruise to left cheek, tender to palpation.  No midface instability.  Full occular movement.   Eyes: EOM are normal. Pupils are equal, round, and reactive to light.  Neck:   c-collar in place.    Cardiovascular: Normal rate, regular rhythm, normal heart sounds and intact distal pulses.   No murmur heard. Pulmonary/Chest: Effort normal and breath sounds normal. No respiratory distress. She has no wheezes. She exhibits no tenderness.  Abdominal: Soft. There is no tenderness. There is no rebound and no guarding.  Musculoskeletal: Normal range of motion. She exhibits no tenderness.  No chest tenderness to palpation, stable, and no crepitus.  No pelvis tenderness to palpation and stable.  Moving all 4 extremities, with no gross deformities, nontender diffusely.  No C/T/L spine midline tenderness, no step offs.    Lymphadenopathy:    She has no cervical adenopathy.  Neurological: She is alert and oriented to person, place, and time. No cranial nerve deficit. Coordination normal.  Skin: Skin is warm and dry. She is not diaphoretic.  Psychiatric: She has a normal mood and affect. Her behavior is normal. Judgment and thought content normal.  Nursing note and vitals reviewed.   ED Course  Procedures (including critical care time) Labs Review Labs Reviewed  CBG MONITORING, ED - Abnormal; Notable for the following:    Glucose-Capillary 105 (*)    All other components within normal limits  CBG MONITORING, ED - Abnormal; Notable for the following:    Glucose-Capillary 55 (*)    All other components within normal limits  CBG MONITORING, ED - Abnormal; Notable for the following:    Glucose-Capillary 109 (*)    All other components within normal limits  URINALYSIS, ROUTINE W REFLEX MICROSCOPIC (NOT AT [facility])  POC URINE PREG, ED  CBG MONITORING, ED    Imaging Review Dg Cervical Spine Complete  10/28/2014   CLINICAL DATA:  Syncope with fall  EXAM: CERVICAL SPINE  4+ VIEWS  COMPARISON:  February 16, 2015  FINDINGS: Frontal, lateral, open-mouth odontoid, and bilateral oblique views were obtained with the patient's neck in collar. There is no fracture or spondylolisthesis.  Prevertebral soft tissues and predental space regions are normal. Disc spaces appear normal. There is no appreciable exit foraminal narrowing on the oblique views.  IMPRESSION: No fracture or spondylolisthesis. No appreciable arthropathy. Note that no assessment for potential ligamentous injury can be made with in collar only images.   Electronically Signed   By: Lowella Grip III M.D.   On: 10/28/2014 15:59   Ct Head Wo Contrast  10/28/2014   CLINICAL DATA:  Syncope.  History of [employer] White syndrome.  EXAM: CT HEAD WITHOUT CONTRAST  TECHNIQUE: Contiguous axial images were obtained from the base of the skull through the vertex without intravenous contrast.  COMPARISON:  07/30/2014  FINDINGS: No evidence of parenchymal hemorrhage or extra-axial fluid collection. No mass lesion, mass effect, or midline shift.  No CT evidence of acute infarction.  Cerebral volume is within normal limits.  No ventriculomegaly.  The visualized paranasal sinuses are essentially clear. The mastoid air cells are unopacified.  No evidence of calvarial fracture.  IMPRESSION: Normal head CT.   Electronically Signed   By: Julian Hy M.D.   On: 10/28/2014 17:22  EKG Interpretation   Date/Time:  Tuesday October 28 2014 13:33:17 EDT Ventricular Rate:  77 PR Interval:  128 QRS Duration: 69 QT Interval:  360 QTC Calculation: 407 R Axis:   74 Text Interpretation:  Sinus rhythm J POINT ELEVATION. NO SIG CHANGE  Confirmed by Johnney Killian, MD, Jeannie Done [phone]) on 10/28/2014 3:54:21 PM      MDM   Final diagnoses:  Syncope and collapse  POTS (postural orthostatic tachycardia syndrome)   Pt is a 24 yo F with hx of recurrent syncope secondary to POTS who presets after a syncopal episode at her PCP's this afternoon. Reports feeling her typical pre-syncope prodrome where she was lightheaded, nauseated, and had tunnel vision while she was walking from the waiting room to her patient room at the physician's office. She  then had a witnessed full syncopal event when she collapsed to the ground.  Hit her left face on the wall on the way down.  + LOC for a few seconds then resolved.  Glucose 48 at PCP, so given oral glucose.  Glucose improved to 90 by EMS and 105 on arrival.   Will work up with CT head and c-spine.  EKG, UA, UPT, and glucose ordered.  Story is very similar to numerous previous similar events however this time also has a hypoglycemic aspect.  Will monitor to ensure glucose stays up.  Is not diabetic and endorses that she didn't eat much at all this morning and hasn't had anything but the PCP's oral glucose all day.  Triage blood work clotted, do not believe that she needs repeat labs as she has a known history to explain her syncope, her typical prodrome, and no recent illnesses that would likely have caused electrolyte abnormalities.  Urine spec grav not consistent with dehydration.    CT head and xrays of c-spine were negative.   C-collar cleared by NEXUS criteria.  Full ROM, nontender at midline, no lightheadedness.    Monitored for a few hours and glucose was re-checked, had dropped down to 55 (4 hours after last PO intake).  She was given a snack and glucose improved again.  Monitored and glucose trended back up appropriately.    Tolerating PO intake, vitals stable, and currently asymptomatic, so considered stable for dc home.  Advised to sit down as soon as she gets her typical prodrome prior to her syncopal events to prevent falling from standing position.  Encouraged a close PCP follow up.  All questions were answered and ED return precautions discussed prior to dc.    If performed, labs, EKGs, and imaging were reviewed and interpreted by myself and my attending, and incorporated in the medical decision making.  Patient was seen with ED Attending, Dr. Burnard Leigh, MD   Tori Milks, MD 10/29/14 2035  Carmin Muskrat, MD 10/31/14 [phone]

## 2014-10-28 NOTE — ED Notes (Signed)
Pt. Presents to ED following syncopal episode at PCP office. Pt. States she felt dizzy and felt her heart racing prior to syncope. Fall was unwitnessed. Complaint of L face and cervical neck pain. PCP office found BS to be 48 at time of syncope, administered oral glucose. Pt. With hx of WPW and POTS, admitted last week for recurrent syncopal episodes. Pt. AxO x4.

## 2014-10-28 NOTE — Progress Notes (Signed)
   Subjective:    Patient ID: Desiree Gutierrez, female    DOB: Dec 04, 1990, 24 y.o.   MRN: [id number] CC: HFU syncope,  HPI 24 yo F presents to establish care with PCP and discuss the following:  1. Syncope:  Hospitalized from 7/14 to 10/18/2014 for syncope. This was her most recent syncopal episode prior to today. She has history of recurrent syncope that started at age 49 while a senior in high school that progressively worsened during her freshman year of school. She has [employer] syndrome as well as POTs syndrome. She has a cardiologist at [facility] that she last saw in April 2016. She would like to transfer her care to a cardiologist in [place], preferably Dr. Algie Coffer.   Her syncope is associated with tachycardia, feel hot and flushed, occasionally turning blue and occasionally with hypoglycemia.   Soc Hx: non smoker Med Hx: recurrent syncope Fam Hx: negative for syncope  Review of Systems  Constitutional: Negative for fever and chills.  Respiratory: Negative for shortness of breath.   Cardiovascular: Positive for palpitations. Negative for chest pain.  Gastrointestinal: Negative for abdominal pain and blood in stool.  Endocrine: Positive for heat intolerance.  Skin: Negative for rash.  Neurological: Positive for syncope. Negative for seizures, speech difficulty, weakness and numbness.  Psychiatric/Behavioral: Negative for suicidal ideas and dysphoric mood.       Objective:   Physical Exam BP 106/71 mmHg  Pulse 80  Temp(Src) 98.9 F (37.2 C) (Oral)  Resp 16  Ht  (1.6 m)  Wt 137 lb (62.143 kg)  BMI 24.27 kg/m2  SpO2 100%  LMP 10/24/2014 General appearance: alert, cooperative and no distress Head: Normocephalic, without obvious abnormality, atraumatic Lungs: clear to auscultation bilaterally Heart: regular rate and rhythm, S1, S2 normal, no murmur, click, rub or gallop Extremities: extremities normal, atraumatic, no cyanosis or edema Pulses: 2+ and  symmetric   While walking to the lab patient has a syncope episode where she fell forward and hit her head. The impact with not witnessed. Patient feel face forward. She was rolled onto her back using 3 person assist. This episode occurred at 12:38 PM Her vitals post syncope: 120/76, 76, 98 % on RA CBG 48 at 12:43 PM Patient given 24 grams of insta glucose   Patient was unresponsive for 30-45 seconds. She then was able to open her eyes spontaneously, move her limbs, respond to questions. She reports that she last two packets of instant oatmeal with water at 9:30 AM  She complained of L sided head pain and leg pain.  EMS called. Patient transferred to hospital awake and alert in a C-collar.       Assessment & Plan:

## 2014-10-29 NOTE — Telephone Encounter (Signed)
Pt aware.

## 2014-10-29 NOTE — Telephone Encounter (Signed)
Patient returned phone call from PCP. Please f/u with pt.

## 2014-10-30 ENCOUNTER — Ambulatory Visit: Payer: Self-pay | Attending: Family Medicine

## 2014-11-02 ENCOUNTER — Emergency Department (HOSPITAL_COMMUNITY)
Admission: EM | Admit: 2014-11-02 | Discharge: 2014-11-02 | Disposition: A | Discharge status: Home / Self Care | Payer: Medicaid Other | Attending: Emergency Medicine | Admitting: Emergency Medicine

## 2014-11-02 ENCOUNTER — Emergency Department (HOSPITAL_COMMUNITY): Payer: Medicaid Other

## 2014-11-02 ENCOUNTER — Encounter (HOSPITAL_COMMUNITY): Payer: Self-pay | Admitting: Emergency Medicine

## 2014-11-02 DIAGNOSIS — R1031 Right lower quadrant pain: Secondary | ICD-10-CM | POA: Insufficient documentation

## 2014-11-02 DIAGNOSIS — Z3202 Encounter for pregnancy test, result negative: Secondary | ICD-10-CM | POA: Insufficient documentation

## 2014-11-02 DIAGNOSIS — R110 Nausea: Secondary | ICD-10-CM | POA: Insufficient documentation

## 2014-11-02 DIAGNOSIS — Z79899 Other long term (current) drug therapy: Secondary | ICD-10-CM | POA: Insufficient documentation

## 2014-11-02 DIAGNOSIS — R42 Dizziness and giddiness: Secondary | ICD-10-CM | POA: Diagnosis not present

## 2014-11-02 DIAGNOSIS — R1030 Lower abdominal pain, unspecified: Secondary | ICD-10-CM | POA: Diagnosis present

## 2014-11-02 LAB — CBC WITH DIFFERENTIAL/PLATELET
BASOS ABS: 0 10*3/uL (ref 0.0–0.1)
Basophils Relative: 0 % (ref 0–1)
EOS PCT: 1 % (ref 0–5)
Eosinophils Absolute: 0.1 10*3/uL (ref 0.0–0.7)
HCT: 32.4 % — ABNORMAL LOW (ref 36.0–46.0)
Hemoglobin: 10.3 g/dL — ABNORMAL LOW (ref 12.0–15.0)
LYMPHS ABS: 2.3 10*3/uL (ref 0.7–4.0)
Lymphocytes Relative: 46 % (ref 12–46)
MCH: 27.5 pg (ref 26.0–34.0)
MCHC: 31.8 g/dL (ref 30.0–36.0)
MCV: 86.4 fL (ref 78.0–100.0)
Monocytes Absolute: 0.2 10*3/uL (ref 0.1–1.0)
Monocytes Relative: 5 % (ref 3–12)
NEUTROS ABS: 2.4 10*3/uL (ref 1.7–7.7)
Neutrophils Relative %: 48 % (ref 43–77)
Platelets: 219 10*3/uL (ref 150–400)
RBC: 3.75 MIL/uL — ABNORMAL LOW (ref 3.87–5.11)
RDW: 12.2 % (ref 11.5–15.5)
WBC: 5 10*3/uL (ref 4.0–10.5)

## 2014-11-02 LAB — COMPREHENSIVE METABOLIC PANEL
ALT: 14 U/L (ref 14–54)
AST: 20 U/L (ref 15–41)
Albumin: 3.7 g/dL (ref 3.5–5.0)
Alkaline Phosphatase: 44 U/L (ref 38–126)
Anion gap: 5 (ref 5–15)
BUN: 11 mg/dL (ref 6–20)
CHLORIDE: 107 mmol/L (ref 101–111)
CO2: 24 mmol/L (ref 22–32)
Calcium: 8.6 mg/dL — ABNORMAL LOW (ref 8.9–10.3)
Creatinine, Ser: 0.66 mg/dL (ref 0.44–1.00)
GLUCOSE: 87 mg/dL (ref 65–99)
POTASSIUM: 3.9 mmol/L (ref 3.5–5.1)
SODIUM: 136 mmol/L (ref 135–145)
Total Bilirubin: 0.3 mg/dL (ref 0.3–1.2)
Total Protein: 6.1 g/dL — ABNORMAL LOW (ref 6.5–8.1)

## 2014-11-02 LAB — URINALYSIS, ROUTINE W REFLEX MICROSCOPIC
BILIRUBIN URINE: NEGATIVE
Glucose, UA: NEGATIVE mg/dL
Hgb urine dipstick: NEGATIVE
Ketones, ur: NEGATIVE mg/dL
Leukocytes, UA: NEGATIVE
Nitrite: NEGATIVE
PH: 6 (ref 5.0–8.0)
Protein, ur: NEGATIVE mg/dL
SPECIFIC GRAVITY, URINE: 1.01 (ref 1.005–1.030)
Urobilinogen, UA: 0.2 mg/dL (ref 0.0–1.0)

## 2014-11-02 LAB — PREGNANCY, URINE: PREG TEST UR: NEGATIVE

## 2014-11-02 LAB — LIPASE, BLOOD: LIPASE: 30 U/L (ref 22–51)

## 2014-11-02 MED ORDER — ONDANSETRON HCL 4 MG/2ML IJ SOLN
4.0000 mg | Freq: Once | INTRAMUSCULAR | Status: AC
  Administered 2014-11-02: 4 mg via INTRAVENOUS
  Filled 2014-11-02: qty 2

## 2014-11-02 MED ORDER — HYDROMORPHONE HCL 1 MG/ML IJ SOLN
1.0000 mg | Freq: Once | INTRAMUSCULAR | Status: AC
  Administered 2014-11-02: 1 mg via INTRAVENOUS
  Filled 2014-11-02: qty 1

## 2014-11-02 MED ORDER — SODIUM CHLORIDE 0.9 % IV BOLUS (SEPSIS)
1000.0000 mL | Freq: Once | INTRAVENOUS | Status: AC
  Administered 2014-11-02: 1000 mL via INTRAVENOUS

## 2014-11-02 MED ORDER — IOHEXOL 300 MG/ML  SOLN
25.0000 mL | Freq: Once | INTRAMUSCULAR | Status: AC | PRN
  Administered 2014-11-02: 25 mL via ORAL

## 2014-11-02 MED ORDER — ONDANSETRON HCL 4 MG PO TABS: 4.0000 mg | ORAL_TABLET | Freq: Three times a day (TID) | ORAL | Status: DC | PRN

## 2014-11-02 MED ORDER — IOHEXOL 300 MG/ML  SOLN
100.0000 mL | Freq: Once | INTRAMUSCULAR | Status: AC | PRN
  Administered 2014-11-02: 100 mL via INTRAVENOUS

## 2014-11-02 NOTE — ED Notes (Addendum)
PT refusing to ambulate at this time. Pt states she is feeling bad and doesn't want to ambulate. RN Toniann Fail and [employer] informed of situation.

## 2014-11-02 NOTE — ED Provider Notes (Signed)
CSN: [id number]     Arrival date & time 11/02/14  0551 History   First MD Initiated Contact with Patient 11/02/14 [phone]     Chief Complaint  Patient presents with  . Abdominal Pain  . Dizziness     (Consider location/radiation/quality/duration/timing/severity/associated sxs/prior Treatment) HPI Patient is a 24 year old female with past medical history of vasovagal syncope, pots, Wolff-Parkinson-White syndrome who presents the ER complaining of lower abdominal pain. Patient reports a gradual onset of nausea, lightheadedness beginning yesterday evening. She reports waking up in the middle the night around 3 AM with a periumbilical and right lower quadrant abdominal pain. Patient states her pain is constant, without aggravating or alleviating factors. Patient reports worsening of her nausea throughout the night as well. Patient denies any vomiting, diarrhea, fever, dysuria, vaginal discharge or bleeding.  Past Medical History  Diagnosis Date  . WPW (Wolff-Parkinson-White syndrome)     a. s/p ablation (2011) Dr Thomasenia Sales at [facility] (posterior lateral pathway)  . Vasovagal syncope     a. positive tilt 2011 b. failed medical therapy with Florinef, Midodrine, Inderal, Celexa  . POTS (postural orthostatic tachycardia syndrome)   . Palpitations     a. s/p MDT ILR implanted by Dr Gelene Mink [facility])  . Headache   . Syncope     recurrent, daily   Past Surgical History  Procedure Laterality Date  . Cardiac electrophysiology study and ablation  2011    Dr. Thomasenia Sales at [facility]- left posterior pathway ablation for WPW  . Tilt table study  2011    neurally mediated syncope  . Loop recorder implant  01/22/2014    MDT LINQ implanted by Dr Gelene Mink at [facility] for evaluation of palpitations/syncope   Family History  Problem Relation Age of Onset  . Diabetes Mother   . Hypertension Mother   . Stroke Mother   . Hypertension Brother    History  Substance Use Topics  . Smoking status: Never Smoker   .  Smokeless tobacco: Not on file  . Alcohol Use: No   OB History    No data available     Review of Systems  Constitutional: Negative for fever.  HENT: Negative for trouble swallowing.   Eyes: Negative for visual disturbance.  Respiratory: Negative for shortness of breath.   Cardiovascular: Negative for chest pain.  Gastrointestinal: Positive for nausea and abdominal pain. Negative for vomiting.  Genitourinary: Negative for dysuria.  Musculoskeletal: Negative for neck pain.  Skin: Negative for rash.  Neurological: Positive for light-headedness. Negative for dizziness, weakness and numbness.  Psychiatric/Behavioral: Negative.       Allergies  Amitiza; Ketorolac; Metoclopramide; Midodrine; and Fludrocortisone  Home Medications   Prior to Admission medications   Medication Sig Start Date End Date Taking? Authorizing Provider  ferrous sulfate 325 (65 FE) MG tablet Take 1 tablet (325 mg total) by mouth daily with breakfast. 10/17/14  Yes Ejiroghene E Emokpae, MD  naproxen sodium (ANAPROX) 220 MG tablet Take 220 mg by mouth daily as needed (Headache).   Yes Historical Provider, MD  Blood Glucose Monitoring Suppl (TRUE METRIX METER) W/DEVICE KIT 1 each by Does not apply route as needed. 10/28/14   Josalyn Funches, MD  glucose blood (TRUE METRIX BLOOD GLUCOSE TEST) test strip 1 each by Other route 3 (three) times daily. 10/28/14   Josalyn Funches, MD  ondansetron (ZOFRAN) 4 MG tablet Take 1 tablet (4 mg total) by mouth every 8 (eight) hours as needed. 11/02/14   Dahlia Bailiff, PA-C  TRUEPLUS LANCETS 28G MISC  1 each by Does not apply route 3 (three) times daily. 10/28/14   Josalyn Funches, MD   BP 112/74 mmHg  Pulse 73  Temp(Src) 98.5 F (36.9 C) (Oral)  Resp 13  Ht _0  (1.6 m)  Wt 137 lb (62.143 kg)  BMI 24.27 kg/m2  SpO2 99%  LMP 10/24/2014 (Approximate) Physical Exam  Constitutional: She is oriented to person, place, and time. She appears well-developed and well-nourished. No  distress.  HENT:  Head: Normocephalic and atraumatic.  Mouth/Throat: Oropharynx is clear and moist. No oropharyngeal exudate.  Eyes: Right eye exhibits no discharge. Left eye exhibits no discharge. No scleral icterus.  Neck: Normal range of motion.  Cardiovascular: Normal rate, regular rhythm and normal heart sounds.   No murmur heard. Pulmonary/Chest: Effort normal and breath sounds normal. No respiratory distress.  Abdominal: Soft. There is tenderness in the right lower quadrant. There is tenderness at McBurney's point. There is no rigidity, no rebound, no guarding and negative Murphy's sign.  Musculoskeletal: Normal range of motion. She exhibits no edema or tenderness.  Neurological: She is alert and oriented to person, place, and time. She has normal strength. No cranial nerve deficit or sensory deficit. Coordination and gait normal. GCS eye subscore is 4. GCS verbal subscore is 5. GCS motor subscore is 6.  Patient fully alert, answering questions appropriately in full, clear sentences. Cranial nerves II through XII grossly intact. Motor strength 5 out of 5 in all major muscle groups of upper and lower extremities. Distal sensation intact.   Skin: Skin is warm and dry. No rash noted. She is not diaphoretic.  Psychiatric: She has a normal mood and affect.  Nursing note and vitals reviewed.   ED Course  Procedures (including critical care time) Labs Review Labs Reviewed  CBC WITH DIFFERENTIAL/PLATELET - Abnormal; Notable for the following:    RBC 3.75 (*)    Hemoglobin 10.3 (*)    HCT 32.4 (*)    All other components within normal limits  COMPREHENSIVE METABOLIC PANEL - Abnormal; Notable for the following:    Calcium 8.6 (*)    Total Protein 6.1 (*)    All other components within normal limits  URINALYSIS, ROUTINE W REFLEX MICROSCOPIC (NOT AT [facility])  LIPASE, BLOOD  PREGNANCY, URINE    Imaging Review Ct Abdomen Pelvis W Contrast  11/02/2014   CLINICAL DATA:  RIGHT lower  quadrant abdominal pain beginning at 3:45 a.m. Nausea.  EXAM: CT ABDOMEN AND PELVIS WITH CONTRAST  TECHNIQUE: Multidetector CT imaging of the abdomen and pelvis was performed using the standard protocol following bolus administration of intravenous contrast.  CONTRAST:  134m OMNIPAQUE IOHEXOL 300 MG/ML  SOLN  COMPARISON:  None.  FINDINGS: Lower chest: Lung bases are clear.  Hepatobiliary: No focal hepatic lesion. No biliary duct dilatation. Gallbladder is normal. Common bile duct is normal.  Pancreas: Pancreas is normal. No ductal dilatation. No pancreatic inflammation.  Spleen: Normal spleen  Adrenals/urinary tract: Adrenal glands and kidneys are normal. The ureters and bladder normal.  Stomach/Bowel: Stomach and small bowel are normal. Cecum is normal and appendix not identified. There is no secondary signs appendicitis. Colon and rectosigmoid colon.  Vascular/Lymphatic: Abdominal aorta is normal caliber. There is no retroperitoneal or periportal lymphadenopathy. No pelvic lymphadenopathy.  Reproductive: Uterus and ovaries are normal. Small amount free fluid in the RIGHT pelvis.  Musculoskeletal: No aggressive osseous lesion.  Other: Small free fluid pelvis  IMPRESSION: 1. Appendix is not identified; however there are no secondary signs of acute  appendicitis. Recommend follow-up imaging if clinical symptoms persist and laboratory values are concerning for appendicitis.  2. Smaller free fluid within the RIGHT pelvis is likely physiologic.   Electronically Signed   By: Suzy Bouchard M.D.   On: 11/02/2014 08:33     EKG Interpretation None      MDM   Final diagnoses:  RLQ abdominal pain    Patient is nontoxic, nonseptic appearing, in no apparent distress.  Patient's pain and other symptoms adequately managed in emergency department.  Fluid bolus given.  Labs, imaging and vitals reviewed.  Patient does not meet the SIRS or Sepsis criteria.  On repeat exam patient does not have a surgical abdomin and  there are no peritoneal signs.  No indication of appendicitis, bowel obstruction, bowel perforation, cholecystitis, diverticulitis, PID or ectopic pregnancy.    CT abdomen pelvis with impression: 1. Appendix is not identified; however there are no secondary signs of acute appendicitis. Recommend follow-up imaging if clinical symptoms persist and laboratory values are concerning for appendicitis.  2. Smaller free fluid within the RIGHT pelvis is likely physiologic.  Patient has no leukocytosis or anemia from baseline. Patient is afebrile, hemodynamically stable and in no acute distress. With repeat examination there is no concern for acute abdomen. Do not believe this CT finding is consistent with appendicitis. Did discuss, however that the appendix was not well-visualized and patient may need close follow-up. Patient has some mild lightheadedness while in the ED, has one episode of tachycardia into the 130s. This resolves spontaneously after several seconds. Her main or of the ED visit patient is nontoxic tachycardic, non-hypoxic, nontachypneic, well-appearing in no acute distress. Reviewing patient's EKG, there are no delta waves, there is no evidence of injury or ectopy, even during this episode of tachycardia. Other than this one episode on the patient's symptoms are completely controlled. Patient was able to ambulate to the bathroom without difficulty. Given patient's history of by mouth TS, likely this episode tachycardia secondary to this. Do not believe there is any acute pathology associated with this episode of several seconds of tachycardia today. Again recommended strongly that patient follow up with PCP early tomorrow.   Patient discharged home with symptomatic treatment and given strict instructions for follow-up with their primary care physician.  I have also discussed reasons to return immediately to the ER.  Patient expresses understanding and agrees with plan.  BP 112/74 mmHg  Pulse  73  Temp(Src) 98.5 F (36.9 C) (Oral)  Resp 13  Ht _0  (1.6 m)  Wt 137 lb (62.143 kg)  BMI 24.27 kg/m2  SpO2 99%  LMP 10/24/2014 (Approximate)  Signed,  Dahlia Bailiff, PA-C 11:30 AM        Dahlia Bailiff, PA-C 11/02/14 [place], PA-C 11/02/14 1133  Jola Schmidt, MD 11/03/14 [phone]

## 2014-11-02 NOTE — Discharge Instructions (Signed)
Follow-up with your primary care doctor early this week. Return to the ER with any high fever, severe abdominal pain, nausea, vomiting, inability to keep food or fluids down, worsening of your symptoms.   Abdominal Pain Many things can cause abdominal pain. Usually, abdominal pain is not caused by a disease and will improve without treatment. It can often be observed and treated at home. Your health care provider will do a physical exam and possibly order blood tests and X-rays to help determine the seriousness of your pain. However, in many cases, more time must pass before a clear cause of the pain can be found. Before that point, your health care provider may not know if you need more testing or further treatment. HOME CARE INSTRUCTIONS  Monitor your abdominal pain for any changes. The following actions may help to alleviate any discomfort you are experiencing:  Only take over-the-counter or prescription medicines as directed by your health care provider.  Do not take laxatives unless directed to do so by your health care provider.  Try a clear liquid diet (broth, tea, or water) as directed by your health care provider. Slowly move to a bland diet as tolerated. SEEK MEDICAL CARE IF:  You have unexplained abdominal pain.  You have abdominal pain associated with nausea or diarrhea.  You have pain when you urinate or have a bowel movement.  You experience abdominal pain that wakes you in the night.  You have abdominal pain that is worsened or improved by eating food.  You have abdominal pain that is worsened with eating fatty foods.  You have a fever. SEEK IMMEDIATE MEDICAL CARE IF:   Your pain does not go away within 2 hours.  You keep throwing up (vomiting).  Your pain is felt only in portions of the abdomen, such as the right side or the left lower portion of the abdomen.  You pass bloody or black tarry stools. MAKE SURE YOU:  Understand these instructions.   Will watch  your condition.   Will get help right away if you are not doing well or get worse.  Document Released: 12/29/2004 Document Revised: 03/26/2013 Document Reviewed: 11/28/2012 [facility] Patient Information 2015 [place], [place]. This information is not intended to replace advice given to you by your health care provider. Make sure you discuss any questions you have with your health care provider.

## 2014-11-02 NOTE — ED Notes (Signed)
Pt arrives from home via [facility] c/o abdominal pain, diaphoresis, dizziness.  Hx WPW, POTS.  Pt ambulatory upon arrival.

## 2014-11-02 NOTE — ED Notes (Signed)
PA Mintz at bedside. 

## 2014-11-06 ENCOUNTER — Encounter: Payer: Self-pay | Admitting: Family Medicine

## 2014-11-06 ENCOUNTER — Ambulatory Visit: Payer: Medicaid Other | Attending: Family Medicine | Admitting: Family Medicine

## 2014-11-06 VITALS — BP 104/65 | HR 94 | Temp 99.1°F | Resp 16 | Ht 63.0 in | Wt 135.0 lb

## 2014-11-06 DIAGNOSIS — R112 Nausea with vomiting, unspecified: Secondary | ICD-10-CM | POA: Diagnosis not present

## 2014-11-06 DIAGNOSIS — E162 Hypoglycemia, unspecified: Secondary | ICD-10-CM

## 2014-11-06 DIAGNOSIS — R55 Syncope and collapse: Secondary | ICD-10-CM

## 2014-11-06 DIAGNOSIS — R1030 Lower abdominal pain, unspecified: Secondary | ICD-10-CM | POA: Insufficient documentation

## 2014-11-06 DIAGNOSIS — R1031 Right lower quadrant pain: Secondary | ICD-10-CM | POA: Insufficient documentation

## 2014-11-06 DIAGNOSIS — I456 Pre-excitation syndrome: Secondary | ICD-10-CM | POA: Diagnosis not present

## 2014-11-06 DIAGNOSIS — Z114 Encounter for screening for human immunodeficiency virus [HIV]: Secondary | ICD-10-CM

## 2014-11-06 LAB — CBC
HEMATOCRIT: 34.8 % — AB (ref 36.0–46.0)
HEMOGLOBIN: 11.6 g/dL — AB (ref 12.0–15.0)
MCH: 28.2 pg (ref 26.0–34.0)
MCHC: 33.3 g/dL (ref 30.0–36.0)
MCV: 84.5 fL (ref 78.0–100.0)
MPV: 9.4 fL (ref 8.6–12.4)
Platelets: 225 10*3/uL (ref 150–400)
RBC: 4.12 MIL/uL (ref 3.87–5.11)
RDW: 13.1 % (ref 11.5–15.5)
WBC: 5.3 10*3/uL (ref 4.0–10.5)

## 2014-11-06 LAB — POCT GLYCOSYLATED HEMOGLOBIN (HGB A1C): Hemoglobin A1C: 5.2

## 2014-11-06 LAB — GLUCOSE, POCT (MANUAL RESULT ENTRY): POC Glucose: 66 mg/dl — AB (ref 70–99)

## 2014-11-06 MED ORDER — TRUEPLUS LANCETS 28G MISC: 1.0000 | Freq: Three times a day (TID) | Status: DC

## 2014-11-06 MED ORDER — TRUE METRIX METER W/DEVICE KIT: 1.0000 | PACK | Status: DC | PRN

## 2014-11-06 MED ORDER — TRAMADOL HCL 50 MG PO TABS: 50.0000 mg | ORAL_TABLET | Freq: Three times a day (TID) | ORAL | Status: DC | PRN

## 2014-11-06 MED ORDER — PROMETHAZINE HCL 25 MG PO TABS: 25.0000 mg | ORAL_TABLET | Freq: Three times a day (TID) | ORAL | Status: DC | PRN

## 2014-11-06 MED ORDER — GLUCOSE BLOOD VI STRP: 1.0000 | ORAL_STRIP | Freq: Three times a day (TID) | Status: DC

## 2014-11-06 NOTE — Assessment & Plan Note (Signed)
Screening HIV ordered  

## 2014-11-06 NOTE — Patient Instructions (Signed)
Desiree Gutierrez,  Thank you for coming in today  1. Syncope: Be sure to eat with goal of avoid sugars < 90 Keep close follow up with cardiology  Meter and supplies ordered   2. RLQ pain: Repeat CT scan ordered Tramadol for severe pain Tylenol for moderate pain Phenergan for nausea   Go to ED for nausea and vomiting that does not stop, severe pain or fever.   F/u in 2 weeks for abdominal pain  Dr. Armen Pickup

## 2014-11-06 NOTE — Progress Notes (Signed)
F/U Syncope  Complaining of abdominal pain Sharp pain vomiting

## 2014-11-06 NOTE — Assessment & Plan Note (Signed)
Syncope: Be sure to eat with goal of avoid sugars < 90 Keep close follow up with cardiology  Meter and supplies ordered

## 2014-11-06 NOTE — Assessment & Plan Note (Signed)
RLQ pain: r/o appendicitis  Repeat CT scan ordered Tramadol for severe pain Tylenol for moderate pain Phenergan for nausea   Go to ED for nausea and vomiting that does not stop, severe pain or fever.

## 2014-11-06 NOTE — Progress Notes (Signed)
   Subjective:    Patient ID: Desiree Gutierrez, female    DOB: 06/14/1990, 24 y.o.   MRN: [id number] CC: RLQ pain  HPI 24 yo F with hx of Wolf Parkinson White syndrome, POTs syndrome and hypoglycemia   1. RLQ pain: x 5 days. Went to ED. W/u negative. Appendix not well visualized on CT abdomen. Persistent pain, nausea, emesis. Last episode of vomiting this AM. LMP 10/24/2014. Does not have pain medicine for pain which is a 6-7/10. Did not fill zofran as it did not help much with nausea in ED.   History  Substance Use Topics  . Smoking status: Never Smoker   . Smokeless tobacco: Not on file  . Alcohol Use: No      Review of Systems  Constitutional: Negative for fever and chills.  Eyes: Negative for visual disturbance.  Respiratory: Negative for shortness of breath.   Cardiovascular: Negative for chest pain.  Gastrointestinal: Positive for nausea, abdominal pain, constipation and rectal pain. Negative for diarrhea, blood in stool and abdominal distention.  Genitourinary: Negative for dysuria and vaginal discharge.  Musculoskeletal: Negative for back pain.  Skin: Negative for rash.  Allergic/Immunologic: Negative for immunocompromised state.  Psychiatric/Behavioral: Negative for suicidal ideas and dysphoric mood.      Objective:   Physical Exam  Constitutional: She is oriented to person, place, and time. She appears well-developed and well-nourished. No distress.  Cardiovascular: Normal rate, regular rhythm, normal heart sounds and intact distal pulses.   Pulmonary/Chest: Effort normal and breath sounds normal.  Abdominal: Soft. Bowel sounds are normal. She exhibits no mass. There is tenderness in the right lower quadrant. There is tenderness at McBurney's point. There is no rigidity, no rebound, no guarding, no CVA tenderness and negative Murphy's sign.  Musculoskeletal: She exhibits no edema.  Neurological: She is alert and oriented to person, place, and time.  Skin: Skin is warm and  dry. No rash noted.  BP 104/65 mmHg  Pulse 94  Temp(Src) 99.1 F (37.3 C) (Oral)  Resp 16  Ht  (1.6 m)  Wt 135 lb (61.236 kg)  BMI 23.92 kg/m2  SpO2 98%  LMP 10/24/2014 (Approximate)     Assessment & Plan:

## 2014-11-06 NOTE — Addendum Note (Signed)
Addended by: [name] on: 11/06/2014 12:20 PM   Modules accepted: Orders

## 2014-11-07 LAB — HIV ANTIBODY (ROUTINE TESTING W REFLEX): HIV 1&2 Ab, 4th Generation: NONREACTIVE

## 2014-11-07 LAB — VITAMIN D 25 HYDROXY (VIT D DEFICIENCY, FRACTURES): Vit D, 25-Hydroxy: 27 ng/mL — ABNORMAL LOW (ref 30–100)

## 2014-11-07 LAB — ABO

## 2014-11-08 ENCOUNTER — Emergency Department (HOSPITAL_COMMUNITY)
Admission: EM | Admit: 2014-11-08 | Discharge: 2014-11-08 | Disposition: A | Discharge status: Home / Self Care | Payer: Medicaid Other | Attending: Emergency Medicine | Admitting: Emergency Medicine

## 2014-11-08 ENCOUNTER — Encounter (HOSPITAL_COMMUNITY): Payer: Self-pay

## 2014-11-08 DIAGNOSIS — R1110 Vomiting, unspecified: Secondary | ICD-10-CM | POA: Diagnosis not present

## 2014-11-08 DIAGNOSIS — R1084 Generalized abdominal pain: Secondary | ICD-10-CM | POA: Diagnosis not present

## 2014-11-08 DIAGNOSIS — Z79899 Other long term (current) drug therapy: Secondary | ICD-10-CM | POA: Diagnosis not present

## 2014-11-08 DIAGNOSIS — R197 Diarrhea, unspecified: Secondary | ICD-10-CM | POA: Insufficient documentation

## 2014-11-08 DIAGNOSIS — R1031 Right lower quadrant pain: Secondary | ICD-10-CM | POA: Diagnosis present

## 2014-11-08 DIAGNOSIS — Z3202 Encounter for pregnancy test, result negative: Secondary | ICD-10-CM | POA: Diagnosis not present

## 2014-11-08 LAB — COMPREHENSIVE METABOLIC PANEL
ALT: 14 U/L (ref 14–54)
AST: 20 U/L (ref 15–41)
Albumin: 4 g/dL (ref 3.5–5.0)
Alkaline Phosphatase: 47 U/L (ref 38–126)
Anion gap: 8 (ref 5–15)
BILIRUBIN TOTAL: 0.4 mg/dL (ref 0.3–1.2)
BUN: 9 mg/dL (ref 6–20)
CO2: 27 mmol/L (ref 22–32)
CREATININE: 0.9 mg/dL (ref 0.44–1.00)
Calcium: 9.2 mg/dL (ref 8.9–10.3)
Chloride: 102 mmol/L (ref 101–111)
GFR calc Af Amer: >60 mL/min (ref >60)
GFR calc non Af Amer: >60 mL/min (ref >60)
Glucose, Bld: 76 mg/dL (ref 65–99)
Potassium: 4 mmol/L (ref 3.5–5.1)
SODIUM: 137 mmol/L (ref 135–145)
TOTAL PROTEIN: 6.6 g/dL (ref 6.5–8.1)

## 2014-11-08 LAB — URINALYSIS, ROUTINE W REFLEX MICROSCOPIC
BILIRUBIN URINE: NEGATIVE
GLUCOSE, UA: NEGATIVE mg/dL
Hgb urine dipstick: NEGATIVE
Ketones, ur: NEGATIVE mg/dL
Leukocytes, UA: NEGATIVE
Nitrite: NEGATIVE
Protein, ur: NEGATIVE mg/dL
Specific Gravity, Urine: 1.003 — ABNORMAL LOW (ref 1.005–1.030)
Urobilinogen, UA: 0.2 mg/dL (ref 0.0–1.0)
pH: 5.5 (ref 5.0–8.0)

## 2014-11-08 LAB — PREGNANCY, URINE: Preg Test, Ur: NEGATIVE

## 2014-11-08 LAB — CBC WITH DIFFERENTIAL/PLATELET
BASOS ABS: 0 10*3/uL (ref 0.0–0.1)
Basophils Relative: 0 % (ref 0–1)
EOS ABS: 0.1 10*3/uL (ref 0.0–0.7)
EOS PCT: 1 % (ref 0–5)
HCT: 34.8 % — ABNORMAL LOW (ref 36.0–46.0)
Hemoglobin: 11.2 g/dL — ABNORMAL LOW (ref 12.0–15.0)
LYMPHS ABS: 2.9 10*3/uL (ref 0.7–4.0)
Lymphocytes Relative: 45 % (ref 12–46)
MCH: 27.9 pg (ref 26.0–34.0)
MCHC: 32.2 g/dL (ref 30.0–36.0)
MCV: 86.6 fL (ref 78.0–100.0)
MONO ABS: 0.4 10*3/uL (ref 0.1–1.0)
Monocytes Relative: 6 % (ref 3–12)
Neutro Abs: 3 10*3/uL (ref 1.7–7.7)
Neutrophils Relative %: 48 % (ref 43–77)
Platelets: 196 10*3/uL (ref 150–400)
RBC: 4.02 MIL/uL (ref 3.87–5.11)
RDW: 12.2 % (ref 11.5–15.5)
WBC: 6.3 10*3/uL (ref 4.0–10.5)

## 2014-11-08 LAB — LIPASE, BLOOD: LIPASE: 32 U/L (ref 22–51)

## 2014-11-08 MED ORDER — ONDANSETRON HCL 4 MG/2ML IJ SOLN
4.0000 mg | Freq: Once | INTRAMUSCULAR | Status: AC
  Administered 2014-11-08: 4 mg via INTRAVENOUS
  Filled 2014-11-08: qty 2

## 2014-11-08 MED ORDER — MORPHINE SULFATE 4 MG/ML IJ SOLN
4.0000 mg | Freq: Once | INTRAMUSCULAR | Status: AC
  Administered 2014-11-08: 4 mg via INTRAVENOUS
  Filled 2014-11-08: qty 1

## 2014-11-08 MED ORDER — SODIUM CHLORIDE 0.9 % IV BOLUS (SEPSIS)
1000.0000 mL | Freq: Once | INTRAVENOUS | Status: AC
  Administered 2014-11-08: 1000 mL via INTRAVENOUS

## 2014-11-08 MED ORDER — HYDROMORPHONE HCL 1 MG/ML IJ SOLN
1.0000 mg | Freq: Once | INTRAMUSCULAR | Status: AC
  Administered 2014-11-08: 1 mg via INTRAVENOUS
  Filled 2014-11-08: qty 1

## 2014-11-08 NOTE — ED Notes (Signed)
[facility] r/t right side abd pain x1 week with diarrhea and vomiting. Pt seen for same in er and pmd. Pt states no change in condition/ and no improvement.

## 2014-11-08 NOTE — ED Provider Notes (Signed)
CSN: [id number]     Arrival date & time 11/08/14  1724 History   First MD Initiated Contact with Patient 11/08/14 1731     Chief Complaint  Patient presents with  . Abdominal Pain     (Consider location/radiation/quality/duration/timing/severity/associated sxs/prior Treatment) HPI Comments: Pt comes in with c/o right lower quadrant abdominal pain times one week. Pt states that she was seen in the er last week and had a scan. She was seen 2 days ago by her pcp for this as well as other complaints and was told for any vomiting or diarrhea to come to the er. She has been having diarrhea today and vomited times one. She hasn't taken any medications for her symptoms today. She denies fever.  The history is provided by the patient. No language interpreter was used.    Past Medical History  Diagnosis Date  . WPW (Wolff-Parkinson-White syndrome)     a. s/p ablation (2011) Dr Thomasenia Sales at [facility] (posterior lateral pathway)  . Vasovagal syncope     a. positive tilt 2011 b. failed medical therapy with Florinef, Midodrine, Inderal, Celexa  . POTS (postural orthostatic tachycardia syndrome)   . Palpitations     a. s/p MDT ILR implanted by Dr Gelene Mink [facility])  . Headache   . Syncope     recurrent, daily   Past Surgical History  Procedure Laterality Date  . Cardiac electrophysiology study and ablation  2011    Dr. Thomasenia Sales at [facility]- left posterior pathway ablation for WPW  . Tilt table study  2011    neurally mediated syncope  . Loop recorder implant  01/22/2014    MDT LINQ implanted by Dr Gelene Mink at [facility] for evaluation of palpitations/syncope   Family History  Problem Relation Age of Onset  . Diabetes Mother   . Hypertension Mother   . Stroke Mother   . Hypertension Brother    History  Substance Use Topics  . Smoking status: Never Smoker   . Smokeless tobacco: Not on file  . Alcohol Use: No   OB History    No data available     Review of Systems  All other systems reviewed and are  negative.     Allergies  Amitiza; Ketorolac; Metoclopramide; Midodrine; and Fludrocortisone  Home Medications   Prior to Admission medications   Medication Sig Start Date End Date Taking? Authorizing Provider  Blood Glucose Monitoring Suppl (TRUE METRIX METER) W/DEVICE KIT 1 each by Does not apply route as needed. 11/06/14   Boykin Nearing, MD  ferrous sulfate 325 (65 FE) MG tablet Take 1 tablet (325 mg total) by mouth daily with breakfast. 10/17/14   Ejiroghene E Emokpae, MD  glucose blood (TRUE METRIX BLOOD GLUCOSE TEST) test strip 1 each by Other route 3 (three) times daily. 11/06/14   Josalyn Funches, MD  naproxen sodium (ANAPROX) 220 MG tablet Take 220 mg by mouth daily as needed (Headache).    Historical Provider, MD  promethazine (PHENERGAN) 25 MG tablet Take 1 tablet (25 mg total) by mouth every 8 (eight) hours as needed for nausea or vomiting. 11/06/14   Josalyn Funches, MD  traMADol (ULTRAM) 50 MG tablet Take 1 tablet (50 mg total) by mouth every 8 (eight) hours as needed for severe pain. 11/06/14   Boykin Nearing, MD  TRUEPLUS LANCETS 28G MISC 1 each by Does not apply route 3 (three) times daily. 11/06/14   Josalyn Funches, MD   BP 113/71 mmHg  Pulse 97  Temp(Src) 98.2 F (36.8 C) (  Oral)  Resp 18  Wt 137 lb (62.143 kg)  SpO2 100%  LMP 10/24/2014 (Approximate) Physical Exam  Constitutional: She is oriented to person, place, and time. She appears well-developed and well-nourished.  HENT:  Head: Normocephalic and atraumatic.  Cardiovascular: Normal rate and regular rhythm.   Pulmonary/Chest: Effort normal and breath sounds normal.  Abdominal: Soft. Bowel sounds are normal. There is no tenderness.  Musculoskeletal: Normal range of motion.  Neurological: She is alert and oriented to person, place, and time.  Skin: Skin is warm and dry.  Psychiatric: She has a normal mood and affect.  Nursing note and vitals reviewed.   ED Course  Procedures (including critical care time) Labs  Review Labs Reviewed  CBC WITH DIFFERENTIAL/PLATELET - Abnormal; Notable for the following:    Hemoglobin 11.2 (*)    HCT 34.8 (*)    All other components within normal limits  URINALYSIS, ROUTINE W REFLEX MICROSCOPIC (NOT AT [facility]) - Abnormal; Notable for the following:    Specific Gravity, Urine 1.003 (*)    All other components within normal limits  COMPREHENSIVE METABOLIC PANEL  LIPASE, BLOOD  PREGNANCY, URINE    Imaging Review No results found.   EKG Interpretation None      MDM   Final diagnoses:  Generalized abdominal pain  Vomiting and diarrhea    Pt is tolerating po here and abdomen continues to be benign. Pt was given pain medication and phenergan at her doctor office that she hadn't filled them yet. Considered appendicitis although think unlikely at this time    Glendell Docker, NP 11/08/14 2031  Carmin Muskrat, MD 11/08/14 2227

## 2014-11-08 NOTE — Discharge Instructions (Signed)
Abdominal Pain °Many things can cause abdominal pain. Usually, abdominal pain is not caused by a disease and will improve without treatment. It can often be observed and treated at home. Your health care provider will do a physical exam and possibly order blood tests and X-rays to help determine the seriousness of your pain. However, in many cases, more time must pass before a clear cause of the pain can be found. Before that point, your health care provider may not know if you need more testing or further treatment. °HOME CARE INSTRUCTIONS  °Monitor your abdominal pain for any changes. The following actions may help to alleviate any discomfort you are experiencing: °· Only take over-the-counter or prescription medicines as directed by your health care provider. °· Do not take laxatives unless directed to do so by your health care provider. °· Try a clear liquid diet (broth, tea, or water) as directed by your health care provider. Slowly move to a bland diet as tolerated. °SEEK MEDICAL CARE IF: °· You have unexplained abdominal pain. °· You have abdominal pain associated with nausea or diarrhea. °· You have pain when you urinate or have a bowel movement. °· You experience abdominal pain that wakes you in the night. °· You have abdominal pain that is worsened or improved by eating food. °· You have abdominal pain that is worsened with eating fatty foods. °· You have a fever. °SEEK IMMEDIATE MEDICAL CARE IF:  °· Your pain does not go away within 2 hours. °· You keep throwing up (vomiting). °· Your pain is felt only in portions of the abdomen, such as the right side or the left lower portion of the abdomen. °· You pass bloody or black tarry stools. °MAKE SURE YOU: °· Understand these instructions.   °· Will watch your condition.   °· Will get help right away if you are not doing well or get worse.   °Document Released: 12/29/2004 Document Revised: 03/26/2013 Document Reviewed: 11/28/2012 °[employer]® Patient Information  ©2015 [employer]. This information is not intended to replace advice given to you by your health care provider. Make sure you discuss any questions you have with your health care provider. ° °Nausea and Vomiting °Nausea is a sick feeling that often comes before throwing up (vomiting). Vomiting is a reflex where stomach contents come out of your mouth. Vomiting can cause severe loss of body fluids (dehydration). Children and elderly adults can become dehydrated quickly, especially if they also have diarrhea. Nausea and vomiting are symptoms of a condition or disease. It is important to find the cause of your symptoms. °CAUSES  °· Direct irritation of the stomach lining. This irritation can result from increased acid production (gastroesophageal reflux disease), infection, food poisoning, taking certain medicines (such as nonsteroidal anti-inflammatory drugs), alcohol use, or tobacco use. °· Signals from the brain. These signals could be caused by a headache, heat exposure, an inner ear disturbance, increased pressure in the brain from injury, infection, a tumor, or a concussion, pain, emotional stimulus, or metabolic problems. °· An obstruction in the gastrointestinal tract (bowel obstruction). °· Illnesses such as diabetes, hepatitis, gallbladder problems, appendicitis, kidney problems, cancer, sepsis, atypical symptoms of a heart attack, or eating disorders. °· Medical treatments such as chemotherapy and radiation. °· Receiving medicine that makes you sleep (general anesthetic) during surgery. °DIAGNOSIS °Your caregiver may ask for tests to be done if the problems do not improve after a few days. Tests may also be done if symptoms are severe or if the reason for the nausea   and vomiting is not clear. Tests may include: °· Urine tests. °· Blood tests. °· Stool tests. °· Cultures (to look for evidence of infection). °· X-rays or other imaging studies. °Test results can help your caregiver make decisions about  treatment or the need for additional tests. °TREATMENT °You need to stay well hydrated. Drink frequently but in small amounts. You may wish to drink water, sports drinks, clear broth, or eat frozen ice pops or gelatin dessert to help stay hydrated. When you eat, eating slowly may help prevent nausea. There are also some antinausea medicines that may help prevent nausea. °HOME CARE INSTRUCTIONS  °· Take all medicine as directed by your caregiver. °· If you do not have an appetite, do not force yourself to eat. However, you must continue to drink fluids. °· If you have an appetite, eat a normal diet unless your caregiver tells you differently. °¨ Eat a variety of complex carbohydrates (rice, wheat, potatoes, bread), lean meats, yogurt, fruits, and vegetables. °¨ Avoid high-fat foods because they are more difficult to digest. °· Drink enough water and fluids to keep your urine clear or pale yellow. °· If you are dehydrated, ask your caregiver for specific rehydration instructions. Signs of dehydration may include: °¨ Severe thirst. °¨ Dry lips and mouth. °¨ Dizziness. °¨ Dark urine. °¨ Decreasing urine frequency and amount. °¨ Confusion. °¨ Rapid breathing or pulse. °SEEK IMMEDIATE MEDICAL CARE IF:  °· You have blood or brown flecks (like coffee grounds) in your vomit. °· You have black or bloody stools. °· You have a severe headache or stiff neck. °· You are confused. °· You have severe abdominal pain. °· You have chest pain or trouble breathing. °· You do not urinate at least once every 8 hours. °· You develop cold or clammy skin. °· You continue to vomit for longer than 24 to 48 hours. °· You have a fever. °MAKE SURE YOU:  °· Understand these instructions. °· Will watch your condition. °· Will get help right away if you are not doing well or get worse. °Document Released: 03/21/2005 Document Revised: 06/13/2011 Document Reviewed: 08/18/2010 °ExitCare® Patient Information ©2015 [employer]. This information is not  intended to replace advice given to you by your health care provider. Make sure you discuss any questions you have with your health care provider. ° °

## 2014-11-08 NOTE — ED Notes (Signed)
Pt alert x4 respirations easy non labored.  

## 2014-11-11 ENCOUNTER — Telehealth: Payer: Self-pay | Admitting: *Deleted

## 2014-11-11 NOTE — Telephone Encounter (Signed)
Pt aware of results  Advised to start taking Vit D3 daily

## 2014-11-11 NOTE — Telephone Encounter (Signed)
-----   Message from [name], MD sent at 11/07/2014  9:15 AM EDT ----- Blood type is A Normal WBC Stable Hgb Screening HIV negtive Vit D insufficient, take 2000 IU D3 daily

## 2014-11-13 ENCOUNTER — Encounter (HOSPITAL_COMMUNITY): Payer: Self-pay

## 2014-11-13 ENCOUNTER — Ambulatory Visit (HOSPITAL_COMMUNITY)
Admission: RE | Admit: 2014-11-13 | Discharge: 2014-11-13 | Disposition: A | Discharge status: Home / Self Care | Payer: Medicaid Other | Source: Ambulatory Visit | Attending: Family Medicine | Admitting: Family Medicine

## 2014-11-13 DIAGNOSIS — R1031 Right lower quadrant pain: Secondary | ICD-10-CM | POA: Insufficient documentation

## 2014-11-13 MED ORDER — IOHEXOL 300 MG/ML  SOLN
100.0000 mL | Freq: Once | INTRAMUSCULAR | Status: AC | PRN
  Administered 2014-11-13: 100 mL via INTRAVENOUS

## 2014-11-19 ENCOUNTER — Encounter: Payer: Self-pay | Admitting: Family Medicine

## 2014-11-19 ENCOUNTER — Ambulatory Visit: Payer: Medicaid Other | Attending: Family Medicine | Admitting: Family Medicine

## 2014-11-19 VITALS — BP 113/70 | HR 81 | Temp 98.7°F | Resp 16 | Ht 63.0 in | Wt 134.0 lb

## 2014-11-19 DIAGNOSIS — R55 Syncope and collapse: Secondary | ICD-10-CM | POA: Diagnosis not present

## 2014-11-19 DIAGNOSIS — I951 Orthostatic hypotension: Secondary | ICD-10-CM

## 2014-11-19 DIAGNOSIS — R1031 Right lower quadrant pain: Secondary | ICD-10-CM | POA: Diagnosis not present

## 2014-11-19 DIAGNOSIS — E162 Hypoglycemia, unspecified: Secondary | ICD-10-CM | POA: Diagnosis not present

## 2014-11-19 DIAGNOSIS — R000 Tachycardia, unspecified: Secondary | ICD-10-CM | POA: Insufficient documentation

## 2014-11-19 DIAGNOSIS — I456 Pre-excitation syndrome: Secondary | ICD-10-CM | POA: Diagnosis not present

## 2014-11-19 DIAGNOSIS — G90A Postural orthostatic tachycardia syndrome (POTS): Secondary | ICD-10-CM

## 2014-11-19 LAB — GLUCOSE, POCT (MANUAL RESULT ENTRY): POC Glucose: 64 mg/dl — AB (ref 70–99)

## 2014-11-19 NOTE — Assessment & Plan Note (Signed)
Resolved CT abdomen negative x 2

## 2014-11-19 NOTE — Patient Instructions (Addendum)
Desiree Gutierrez,  Thank you for coming in today   1. Abdominal pain: resolved.  Repeat CT abdomen was normal  2. Fainting, dizziness, shortness of breath, blue hands I do feel it is all related But the underlying cause and treatment is outside of my scope Continue follow up with your cardiologist   3. Low blood sugars: Please monitor sugars Increases carbs in diet Goal is no sugars less than 70   Recommended EPs in town, Dr. Driscilla Moats works in the medical building next door. I have placed a referral.   F/u in 6-8  weeks with me  Dr. Armen Pickup   Raynaud's Syndrome Raynaud's Syndrome is a disorder of the blood vessels in your hands and feet. It occurs when small arteries of the arms/hands or legs/feet become sensitive to cold or emotional upset. This causes the arteries to constrict, or narrow, and reduces blood flow to the area. The color in the fingers or toes changes from white to bluish to red and this is not usually painful. There may be numbness and tingling. Sores on the skin (ulcers) can form. Symptoms are usually relieved by warming. HOME CARE INSTRUCTIONS   Avoid exposure to cold. Keep your whole body warm and dry. Dress in layers. Wear mittens or gloves when handling ice or frozen food and when outdoors. Use holders for glasses or cans containing cold drinks. If possible, stay indoors during cold weather.  Limit your use of caffeine. Switch to decaffeinated coffee, tea, and soda pop. Avoid chocolate.  Avoid smoking or being around cigarette smoke. Smoke will make symptoms worse.  Wear loose fitting socks and comfortable, roomy shoes.  Avoid vibrating tools and machinery.  If possible, avoid stressful and emotional situations. Exercise, meditation and yoga may help you cope with stress. Biofeedback may be useful.  Ask your caregiver about medicine (calcium channel blockers) that may control Raynaud's phenomena. SEEK MEDICAL CARE IF:   Your discomfort becomes worse,  despite conservative treatment.  You develop sores on your fingers and toes that do not heal. Document Released: 03/18/2000 Document Revised: 06/13/2011 Document Reviewed: 03/25/2008 [facility] Patient Information 2015 [place], [place]. This information is not intended to replace advice given to you by your health care provider. Make sure you discuss any questions you have with your health care provider.

## 2014-11-19 NOTE — Progress Notes (Signed)
   Subjective:    Patient ID: Desiree Gutierrez, female    DOB: 1990/04/21, 24 y.o.   MRN: [id number] CC: f/u abdominal pain  HPI 24 yo F with now POTs, WPW, recurrent syncope presents for f/u   1. Abdominal pain: went for f/u CT scan. Scan was negative. There was no appendiciits.   2. Syncope: has recurrent syncope. Had an episode 5 days ago. No injury. Has been have SOB with dizziness lately that comes and goes. No known triggers. Asking if I can recommend and electrophysiologist in town that accepts the orange card.   3. Hypoglycemia: patient has low CBG on last 2 OV despite eating. On first OV here she had syncopal episode. Has been prescribed a meter. Is not monitoring sugar. Did eat breakfast today.   Social History  Substance Use Topics  . Smoking status: Never Smoker   . Smokeless tobacco: Not on file  . Alcohol Use: No   Review of Systems  Constitutional: Negative for fever and chills.  Eyes: Negative for visual disturbance.  Respiratory: Positive for shortness of breath.   Cardiovascular: Negative for chest pain.  Gastrointestinal: Positive for abdominal pain. Negative for blood in stool.  Musculoskeletal: Negative for back pain and arthralgias.  Skin: Negative for rash.  Allergic/Immunologic: Negative for immunocompromised state.  Neurological: Positive for syncope.  Hematological: Negative for adenopathy. Does not bruise/bleed easily.  Psychiatric/Behavioral: Negative for suicidal ideas and dysphoric mood.      Objective:   Physical Exam  Constitutional: She is oriented to person, place, and time. She appears well-developed and well-nourished. No distress.  HENT:  Head: Normocephalic and atraumatic.  Cardiovascular: Normal rate, regular rhythm, normal heart sounds and intact distal pulses.   Pulmonary/Chest: Effort normal and breath sounds normal.  Musculoskeletal: She exhibits no edema.  Neurological: She is alert and oriented to person, place, and time.  Skin: Skin  is warm and dry. No rash noted.  Psychiatric: She has a normal mood and affect.  BP 113/70 mmHg  Pulse 81  Temp(Src) 98.7 F (37.1 C) (Oral)  Resp 16  Ht  (1.6 m)  Wt 134 lb (60.782 kg)  BMI 23.74 kg/m2  SpO2 100%  LMP 10/28/2014  CBG 65     Assessment & Plan:

## 2014-11-19 NOTE — Assessment & Plan Note (Signed)
A: persistent P: Patient asked to increase carbs and monitor sugars

## 2014-11-19 NOTE — Progress Notes (Signed)
F/U Abdominal pain and CT results  Syncope last Friday. No injury.

## 2014-11-19 NOTE — Assessment & Plan Note (Signed)
Unchanged Patient referred to Dr. Armanda Magic, however, I am unsure if she accepts orange patients.

## 2014-11-27 ENCOUNTER — Encounter (HOSPITAL_COMMUNITY): Payer: Self-pay | Admitting: *Deleted

## 2014-11-27 ENCOUNTER — Emergency Department (HOSPITAL_COMMUNITY): Payer: Medicaid Other

## 2014-11-27 ENCOUNTER — Emergency Department (HOSPITAL_COMMUNITY)
Admission: EM | Admit: 2014-11-27 | Discharge: 2014-11-27 | Disposition: A | Discharge status: Home / Self Care | Payer: Medicaid Other | Attending: Emergency Medicine | Admitting: Emergency Medicine

## 2014-11-27 DIAGNOSIS — R079 Chest pain, unspecified: Secondary | ICD-10-CM | POA: Insufficient documentation

## 2014-11-27 DIAGNOSIS — Z79899 Other long term (current) drug therapy: Secondary | ICD-10-CM | POA: Insufficient documentation

## 2014-11-27 DIAGNOSIS — R000 Tachycardia, unspecified: Secondary | ICD-10-CM | POA: Insufficient documentation

## 2014-11-27 LAB — CBC
HCT: 33 % — ABNORMAL LOW (ref 36.0–46.0)
Hemoglobin: 10.8 g/dL — ABNORMAL LOW (ref 12.0–15.0)
MCH: 28.7 pg (ref 26.0–34.0)
MCHC: 32.7 g/dL (ref 30.0–36.0)
MCV: 87.8 fL (ref 78.0–100.0)
Platelets: 193 10*3/uL (ref 150–400)
RBC: 3.76 MIL/uL — ABNORMAL LOW (ref 3.87–5.11)
RDW: 12.1 % (ref 11.5–15.5)
WBC: 5.5 10*3/uL (ref 4.0–10.5)

## 2014-11-27 LAB — BASIC METABOLIC PANEL
Anion gap: 6 (ref 5–15)
BUN: 8 mg/dL (ref 6–20)
CO2: 28 mmol/L (ref 22–32)
Calcium: 9.2 mg/dL (ref 8.9–10.3)
Chloride: 104 mmol/L (ref 101–111)
Creatinine, Ser: 0.87 mg/dL (ref 0.44–1.00)
GFR calc Af Amer: >60 mL/min (ref >60)
GFR calc non Af Amer: >60 mL/min (ref >60)
Glucose, Bld: 130 mg/dL — ABNORMAL HIGH (ref 65–99)
Potassium: 4 mmol/L (ref 3.5–5.1)
Sodium: 138 mmol/L (ref 135–145)

## 2014-11-27 LAB — I-STAT TROPONIN, ED: Troponin i, poc: 0 ng/mL (ref 0.00–0.08)

## 2014-11-27 MED ORDER — LORAZEPAM 1 MG PO TABS
1.0000 mg | ORAL_TABLET | Freq: Once | ORAL | Status: AC
  Administered 2014-11-27: 1 mg via ORAL
  Filled 2014-11-27: qty 1

## 2014-11-27 MED ORDER — LORAZEPAM 2 MG/ML IJ SOLN: 1.0000 mg | Freq: Once | INTRAMUSCULAR | Status: DC

## 2014-11-27 NOTE — Discharge Instructions (Signed)

## 2014-11-27 NOTE — ED Notes (Signed)
Pt arrives from home via [facility]. Pt states she was walking around her house when she began having cp. Pt received 324 mg of ASA PTA.

## 2014-11-27 NOTE — ED Provider Notes (Signed)
CSN: [id number]     Arrival date & time 11/27/14  1717 History   First MD Initiated Contact with Patient 11/27/14 1718     Chief Complaint  Patient presents with  . Chest Pain     (Consider location/radiation/quality/duration/timing/severity/associated sxs/prior Treatment) HPI Comments: 24 year old female presenting via EMS complaining of sudden onset left-sided chest pain beginning about 30 minutes prior to arrival. She was walking around her house when the pain began. Pain is located on the left side of her chest, described as sharp, radiating to the middle of her back, rated 8/10, unrelieved by 324 mg aspirin given prior to arrival. Denies ever having chest pain like this in the past. Admits to associated shortness of breath that was present initially that has subsided on its own. Feels little lightheaded. Reports a few weeks back she had a viral illness that she completely recovered from and otherwise has not been sick. Denies cough, fever, chills, wheezing, palpitations, nausea or vomiting. Nonsmoker. No recent long travel or surgery. No history of blood clots. No exogenous estrogen. Has an appointment with her cardiologist next month at [facility]. States she has not had any problems with her heart recently and he only cardiac surgeries she's had is a cardiac electrophysiology study and ablation in 2011 along with a loop recorder implanted 2015.  Patient is a 24 y.o. female presenting with chest pain. The history is provided by the patient and the EMS personnel.  Chest Pain Associated symptoms: shortness of breath     Past Medical History  Diagnosis Date  . WPW (Wolff-Parkinson-White syndrome)     a. s/p ablation (2011) Dr Thomasenia Sales at [facility] (posterior lateral pathway)  . Vasovagal syncope     a. positive tilt 2011 b. failed medical therapy with Florinef, Midodrine, Inderal, Celexa  . POTS (postural orthostatic tachycardia syndrome)   . Palpitations     a. s/p MDT ILR implanted by Dr  Gelene Mink [facility])  . Headache   . Syncope     recurrent, daily   Past Surgical History  Procedure Laterality Date  . Cardiac electrophysiology study and ablation  2011    Dr. Thomasenia Sales at [facility]- left posterior pathway ablation for WPW  . Tilt table study  2011    neurally mediated syncope  . Loop recorder implant  01/22/2014    MDT LINQ implanted by Dr Gelene Mink at [facility] for evaluation of palpitations/syncope   Family History  Problem Relation Age of Onset  . Diabetes Mother   . Hypertension Mother   . Stroke Mother   . Hypertension Brother    Social History  Substance Use Topics  . Smoking status: Never Smoker   . Smokeless tobacco: None  . Alcohol Use: No   OB History    No data available     Review of Systems  Respiratory: Positive for shortness of breath.   Cardiovascular: Positive for chest pain.  All other systems reviewed and are negative.     Allergies  Amitiza; Ketorolac; Metoclopramide; Midodrine; and Fludrocortisone  Home Medications   Prior to Admission medications   Medication Sig Start Date End Date Taking? Authorizing Provider  cholecalciferol (VITAMIN D) 1000 UNITS tablet Take 1,000 Units by mouth daily.   Yes Historical Provider, MD  ferrous sulfate 325 (65 FE) MG tablet Take 1 tablet (325 mg total) by mouth daily with breakfast. 10/17/14  Yes Ejiroghene E Emokpae, MD  Blood Glucose Monitoring Suppl (TRUE METRIX METER) W/DEVICE KIT 1 each by Does not apply route  as needed. Patient not taking: Reported on 11/27/2014 11/06/14   Boykin Nearing, MD  glucose blood (TRUE METRIX BLOOD GLUCOSE TEST) test strip 1 each by Other route 3 (three) times daily. Patient not taking: Reported on 11/27/2014 11/06/14   Boykin Nearing, MD  TRUEPLUS LANCETS 28G MISC 1 each by Does not apply route 3 (three) times daily. Patient not taking: Reported on 11/27/2014 11/06/14   Josalyn Funches, MD   BP 119/58 mmHg  Pulse 87  Temp(Src) 99.6 F (37.6 C) (Oral)  Resp 18  SpO2 100%   LMP 11/25/2014 Physical Exam  Constitutional: She is oriented to person, place, and time. She appears well-developed and well-nourished. No distress.  HENT:  Head: Normocephalic and atraumatic.  Mouth/Throat: Oropharynx is clear and moist.  Eyes: Conjunctivae and EOM are normal. Pupils are equal, round, and reactive to light.  Neck: Normal range of motion. Neck supple. No JVD present.  Cardiovascular: Normal rate, regular rhythm, normal heart sounds and intact distal pulses.   Becomes tachycardic on sitting upright. No extremity edema.  Pulmonary/Chest: Effort normal and breath sounds normal. No respiratory distress.  Abdominal: Soft. Bowel sounds are normal. There is no tenderness.  Musculoskeletal: Normal range of motion. She exhibits no edema.  Neurological: She is alert and oriented to person, place, and time. She has normal strength. No sensory deficit.  Speech fluent, goal oriented. Moves extremities without ataxia. Equal grip strength bilateral.  Skin: Skin is warm and dry. She is not diaphoretic.  Psychiatric: She has a normal mood and affect. Her behavior is normal.  Nursing note and vitals reviewed.   ED Course  Procedures (including critical care time) Labs Review Labs Reviewed  CBC - Abnormal; Notable for the following:    RBC 3.76 (*)    Hemoglobin 10.8 (*)    HCT 33.0 (*)    All other components within normal limits  BASIC METABOLIC PANEL - Abnormal; Notable for the following:    Glucose, Bld 130 (*)    All other components within normal limits  I-STAT TROPOININ, ED    Imaging Review Dg Chest 2 View  11/27/2014   CLINICAL DATA:  Sudden onset of chest pain.  EXAM: CHEST  2 VIEW  COMPARISON:  10/02/2014  FINDINGS: The heart size and mediastinal contours are within normal limits. Both lungs are clear. The visualized skeletal structures are unremarkable.  IMPRESSION: No active cardiopulmonary disease.   Electronically Signed   By: Kerby Moors M.D.   On: 11/27/2014  18:13   I have personally reviewed and evaluated these images and lab results as part of my medical decision-making.   EKG Interpretation   Date/Time:  Thursday November 27 2014 17:24:17 EDT Ventricular Rate:  94 PR Interval:  120 QRS Duration: 76 QT Interval:  348 QTC Calculation: 435 R Axis:   83 Text Interpretation:  Sinus rhythm No significant change since last  tracing Confirmed by KOHUT  MD, STEPHEN ([id number]) on 11/27/2014 5:27:08 PM      MDM   Final diagnoses:  Chest pain, unspecified chest pain type   Non-toxic appearing, NAD. AFVSS. Tachy when sitting up only, pt with hx of POTs and this is normal for her. Doubt CP from CAD or PE. Low risk. Workup negative. Has not had any issues with WPW/POTS. This is her 20th visit in the last 6 months. CP completely resolved after receiving PO ativan. Denies stress/anxiety. Advised f/u with PCP and cardiologist. Stable for d/c. Return precautions given. Patient states understanding of treatment  care plan and is agreeable.  Carman Ching, PA-C 11/27/14 1947  Virgel Manifold, MD 12/04/14 [phone]

## 2014-11-27 NOTE — ED Notes (Signed)
To xray via stretcher at this time

## 2014-11-27 NOTE — ED Notes (Signed)
IV attempted x's 2 without success 

## 2014-12-02 ENCOUNTER — Emergency Department (HOSPITAL_COMMUNITY)
Admission: EM | Admit: 2014-12-02 | Discharge: 2014-12-02 | Disposition: A | Discharge status: Home / Self Care | Payer: Medicaid Other | Attending: Emergency Medicine | Admitting: Emergency Medicine

## 2014-12-02 ENCOUNTER — Encounter (HOSPITAL_COMMUNITY): Payer: Self-pay | Admitting: *Deleted

## 2014-12-02 DIAGNOSIS — R55 Syncope and collapse: Secondary | ICD-10-CM | POA: Insufficient documentation

## 2014-12-02 DIAGNOSIS — Z3202 Encounter for pregnancy test, result negative: Secondary | ICD-10-CM | POA: Insufficient documentation

## 2014-12-02 DIAGNOSIS — R42 Dizziness and giddiness: Secondary | ICD-10-CM | POA: Diagnosis not present

## 2014-12-02 DIAGNOSIS — Z79899 Other long term (current) drug therapy: Secondary | ICD-10-CM | POA: Diagnosis not present

## 2014-12-02 LAB — I-STAT CHEM 8, ED
BUN: 9 mg/dL (ref 6–20)
CALCIUM ION: 1.12 mmol/L (ref 1.12–1.23)
CREATININE: 0.7 mg/dL (ref 0.44–1.00)
Chloride: 106 mmol/L (ref 101–111)
Glucose, Bld: 109 mg/dL — ABNORMAL HIGH (ref 65–99)
HCT: 33 % — ABNORMAL LOW (ref 36.0–46.0)
HEMOGLOBIN: 11.2 g/dL — AB (ref 12.0–15.0)
Potassium: 3.7 mmol/L (ref 3.5–5.1)
SODIUM: 141 mmol/L (ref 135–145)
TCO2: 24 mmol/L (ref 0–100)

## 2014-12-02 LAB — CBC WITH DIFFERENTIAL/PLATELET
Basophils Absolute: 0 10*3/uL (ref 0.0–0.1)
Basophils Relative: 0 % (ref 0–1)
EOS PCT: 1 % (ref 0–5)
Eosinophils Absolute: 0.1 10*3/uL (ref 0.0–0.7)
HCT: 32.7 % — ABNORMAL LOW (ref 36.0–46.0)
HEMOGLOBIN: 10.5 g/dL — AB (ref 12.0–15.0)
LYMPHS ABS: 2.3 10*3/uL (ref 0.7–4.0)
Lymphocytes Relative: 35 % (ref 12–46)
MCH: 28.2 pg (ref 26.0–34.0)
MCHC: 32.1 g/dL (ref 30.0–36.0)
MCV: 87.9 fL (ref 78.0–100.0)
MONOS PCT: 5 % (ref 3–12)
Monocytes Absolute: 0.3 10*3/uL (ref 0.1–1.0)
NEUTROS PCT: 59 % (ref 43–77)
Neutro Abs: 3.8 10*3/uL (ref 1.7–7.7)
Platelets: 196 10*3/uL (ref 150–400)
RBC: 3.72 MIL/uL — ABNORMAL LOW (ref 3.87–5.11)
RDW: 12 % (ref 11.5–15.5)
WBC: 6.5 10*3/uL (ref 4.0–10.5)

## 2014-12-02 LAB — I-STAT BETA HCG BLOOD, ED (MC, WL, AP ONLY): I-stat hCG, quantitative: <5 m[IU]/mL (ref <5)

## 2014-12-02 NOTE — ED Notes (Signed)
Pt comfortable with discharge and follow up instructions.

## 2014-12-02 NOTE — ED Notes (Signed)
Pt arrives from home via [facility]. Pt states she was at school and felt dizzy/lightheaded and couldn't quit coughing. Pt left school early and went home and ate lunch, stating she thought that would make her feel better. Pt states she was on the phone with her pastor and the next thing she remembers is the fire dept being there. Upon fire arrival they state the pt was difficult to arouse and sluggish. There was no one at home with the pt when the syncopal episode occurred. Pt has a hx of [employer].

## 2014-12-02 NOTE — Discharge Instructions (Signed)
Make sure you eat well-balanced meals through the day. Follow with primary care doctor. Do not drive  Syncope Syncope is a medical term for fainting or passing out. This means you lose consciousness and drop to the ground. People are generally unconscious for less than 5 minutes. You may have some muscle twitches for up to 15 seconds before waking up and returning to normal. Syncope occurs more often in older adults, but it can happen to anyone. While most causes of syncope are not dangerous, syncope can be a sign of a serious medical problem. It is important to seek medical care.  CAUSES  Syncope is caused by a sudden drop in blood flow to the brain. The specific cause is often not determined. Factors that can bring on syncope include:  Taking medicines that lower blood pressure.  Sudden changes in posture, such as standing up quickly.  Taking more medicine than prescribed.  Standing in one place for too long.  Seizure disorders.  Dehydration and excessive exposure to heat.  Low blood sugar (hypoglycemia).  Straining to have a bowel movement.  Heart disease, irregular heartbeat, or other circulatory problems.  Fear, emotional distress, seeing blood, or severe pain. SYMPTOMS  Right before fainting, you may:  Feel dizzy or light-headed.  Feel nauseous.  See all white or all black in your field of vision.  Have cold, clammy skin. DIAGNOSIS  Your health care provider will ask about your symptoms, perform a physical exam, and perform an electrocardiogram (ECG) to record the electrical activity of your heart. Your health care provider may also perform other heart or blood tests to determine the cause of your syncope which may include:  Transthoracic echocardiogram (TTE). During echocardiography, sound waves are used to evaluate how blood flows through your heart.  Transesophageal echocardiogram (TEE).  Cardiac monitoring. This allows your health care provider to monitor your  heart rate and rhythm in real time.  Holter monitor. This is a portable device that records your heartbeat and can help diagnose heart arrhythmias. It allows your health care provider to track your heart activity for several days, if needed.  Stress tests by exercise or by giving medicine that makes the heart beat faster. TREATMENT  In most cases, no treatment is needed. Depending on the cause of your syncope, your health care provider may recommend changing or stopping some of your medicines. HOME CARE INSTRUCTIONS  Have someone stay with you until you feel stable.  Do not drive, use machinery, or play sports until your health care provider says it is okay.  Keep all follow-up appointments as directed by your health care provider.  Lie down right away if you start feeling like you might faint. Breathe deeply and steadily. Wait until all the symptoms have passed.  Drink enough fluids to keep your urine clear or pale yellow.  If you are taking blood pressure or heart medicine, get up slowly and take several minutes to sit and then stand. This can reduce dizziness. SEEK IMMEDIATE MEDICAL CARE IF:   You have a severe headache.  You have unusual pain in the chest, abdomen, or back.  You are bleeding from your mouth or rectum, or you have black or tarry stool.  You have an irregular or very fast heartbeat.  You have pain with breathing.  You have repeated fainting or seizure-like jerking during an episode.  You faint when sitting or lying down.  You have confusion.  You have trouble walking.  You have severe weakness.  You have vision problems. If you fainted, call your local emergency services (911 in [place] Do not drive yourself to the hospital.  MAKE SURE YOU:  Understand these instructions.  Will watch your condition.  Will get help right away if you are not doing well or get worse. Document Released: 03/21/2005 Document Revised: 03/26/2013 Document Reviewed:  05/20/2011 [facility] Patient Information 2015 [place], [place]. This information is not intended to replace advice given to you by your health care provider. Make sure you discuss any questions you have with your health care provider.

## 2014-12-02 NOTE — ED Provider Notes (Signed)
CSN: [id number]     Arrival date & time 12/02/14  65 History   First MD Initiated Contact with Patient 12/02/14 1535     Chief Complaint  Patient presents with  . Loss of Consciousness     (Consider location/radiation/quality/duration/timing/severity/associated sxs/prior Treatment) HPI Desiree Gutierrez is a 24 y.o. female with history of vasovagal syncope episodes, POTS, palpitations, WPW, presents to emergency department with complaint of a syncope. Patient states that she did not feel well while in class and had a coughing spell. She then left [place] went to get something to eat because she felt lightheaded. Sometime later, patient states she was on the phone with her pastor when she had a syncopal episode. She then woke up to EMS standing next to her. Patient has no symptoms at this time. Patient has had multiple visits for the same and has had extensive workup including Holter monitor, blood work, echo, x-rays. Patient was found to be hypoglycemic during one of the visits. He has however reports blood sugar of 150. Patient denies dizziness at this time.   Past Medical History  Diagnosis Date  . WPW (Wolff-Parkinson-White syndrome)     a. s/p ablation (2011) Dr Thomasenia Sales at [facility] (posterior lateral pathway)  . Vasovagal syncope     a. positive tilt 2011 b. failed medical therapy with Florinef, Midodrine, Inderal, Celexa  . POTS (postural orthostatic tachycardia syndrome)   . Palpitations     a. s/p MDT ILR implanted by Dr Gelene Mink [facility])  . Headache   . Syncope     recurrent, daily   Past Surgical History  Procedure Laterality Date  . Cardiac electrophysiology study and ablation  2011    Dr. Thomasenia Sales at [facility]- left posterior pathway ablation for WPW  . Tilt table study  2011    neurally mediated syncope  . Loop recorder implant  01/22/2014    MDT LINQ implanted by Dr Gelene Mink at [facility] for evaluation of palpitations/syncope   Family History  Problem Relation Age of Onset  .  Diabetes Mother   . Hypertension Mother   . Stroke Mother   . Hypertension Brother    Social History  Substance Use Topics  . Smoking status: Never Smoker   . Smokeless tobacco: None  . Alcohol Use: No   OB History    No data available     Review of Systems  Constitutional: Negative for fever and chills.  Respiratory: Negative for cough, chest tightness and shortness of breath.   Cardiovascular: Negative for chest pain, palpitations and leg swelling.  Gastrointestinal: Negative for nausea, vomiting, abdominal pain and diarrhea.  Genitourinary: Negative for dysuria, flank pain and pelvic pain.  Musculoskeletal: Negative for myalgias, arthralgias, neck pain and neck stiffness.  Skin: Negative for rash.  Neurological: Positive for dizziness, syncope and light-headedness. Negative for weakness and headaches.  All other systems reviewed and are negative.     Allergies  Amitiza; Ketorolac; Metoclopramide; Midodrine; and Fludrocortisone  Home Medications   Prior to Admission medications   Medication Sig Start Date End Date Taking? Authorizing Provider  Blood Glucose Monitoring Suppl (TRUE METRIX METER) W/DEVICE KIT 1 each by Does not apply route as needed. Patient not taking: Reported on 11/27/2014 11/06/14   Boykin Nearing, MD  cholecalciferol (VITAMIN D) 1000 UNITS tablet Take 1,000 Units by mouth daily.    Historical Provider, MD  ferrous sulfate 325 (65 FE) MG tablet Take 1 tablet (325 mg total) by mouth daily with breakfast. 10/17/14   Ejiroghene E  Emokpae, MD  glucose blood (TRUE METRIX BLOOD GLUCOSE TEST) test strip 1 each by Other route 3 (three) times daily. Patient not taking: Reported on 11/27/2014 11/06/14   Boykin Nearing, MD  TRUEPLUS LANCETS 28G MISC 1 each by Does not apply route 3 (three) times daily. Patient not taking: Reported on 11/27/2014 11/06/14   Josalyn Funches, MD   BP 113/61 mmHg  Pulse 80  Temp(Src) 98.7 F (37.1 C) (Oral)  Resp 22  Ht $R'5\' 3"'vQ$  (1.6 m)   Wt 132 lb (59.875 kg)  BMI 23.39 kg/m2  SpO2 100%  LMP 11/25/2014 Physical Exam  Constitutional: She is oriented to person, place, and time. She appears well-developed and well-nourished. No distress.  HENT:  Head: Normocephalic.  Eyes: Conjunctivae are normal.  Neck: Neck supple.  Cardiovascular: Normal rate, regular rhythm and normal heart sounds.   Pulmonary/Chest: Effort normal and breath sounds normal. No respiratory distress. She has no wheezes. She has no rales.  Abdominal: Soft. Bowel sounds are normal. She exhibits no distension. There is no tenderness. There is no rebound.  Musculoskeletal: She exhibits no edema.  Neurological: She is alert and oriented to person, place, and time.  5/5 and equal upper and lower extremity strength bilaterally. Equal grip strength bilaterally. Normal finger to nose and heel to shin. No pronator drift.   Skin: Skin is warm and dry.  Psychiatric: She has a normal mood and affect. Her behavior is normal.  Nursing note and vitals reviewed.   ED Course  Procedures (including critical care time) Labs Review Labs Reviewed  CBC WITH DIFFERENTIAL/PLATELET - Abnormal; Notable for the following:    RBC 3.72 (*)    Hemoglobin 10.5 (*)    HCT 32.7 (*)    All other components within normal limits  I-STAT CHEM 8, ED - Abnormal; Notable for the following:    Glucose, Bld 109 (*)    Hemoglobin 11.2 (*)    HCT 33.0 (*)    All other components within normal limits  I-STAT BETA HCG BLOOD, ED ([facility], [facility], [facility] ONLY)    Imaging Review No results found. I have personally reviewed and evaluated these images and lab results as part of my medical decision-making.   EKG Interpretation   Date/Time:  Tuesday December 02 2014 15:41:33 EDT Ventricular Rate:  93 PR Interval:  120 QRS Duration: 79 QT Interval:  348 QTC Calculation: 433 R Axis:   71 Text Interpretation:  Sinus rhythm Confirmed by ZACKOWSKI  MD, SCOTT  ([id number]) on 12/02/2014 3:46:06 PM      MDM    Final diagnoses:  Syncope, unspecified syncope type   Pt with syncopal episode. Multiple similar presentations in the past. Extensive work up for this. Patient is currently asymptomatic. She ambulated in the hallway. Will check CBC, Chem-8, pregnancy test. She is not orthostatic. Vital signs are normal. Exam unremarkable. ECG normal.   5:56 PM Labs unremarkable, she is anemic but at baseline. She is asymptomatic. Will discharge home with close outpatient follow-up. Discussed with Dr. Darl Householder who agrees with the plan.   Filed Vitals:   12/02/14 1547 12/02/14 1600 12/02/14 1615 12/02/14 1710  BP:  111/60 104/65 113/61  Pulse:  92 97 80  Temp:      TempSrc:      Resp:  $Remo'18 24 22  'UQftn$ Height: $Rem'5\' 3"'NdTW$  (1.6 m)     Weight: 132 lb (59.875 kg)     SpO2:  100% 100% 100%        Amna Welker  Marc Morgans, PA-C 12/02/14 [place], MD 12/03/14 [phone]

## 2014-12-04 ENCOUNTER — Encounter: Payer: Self-pay | Admitting: Family Medicine

## 2014-12-04 ENCOUNTER — Ambulatory Visit: Payer: Medicaid Other | Attending: Family Medicine | Admitting: Family Medicine

## 2014-12-04 VITALS — BP 114/70 | HR 88 | Temp 98.1°F | Ht 63.0 in | Wt 137.0 lb

## 2014-12-04 DIAGNOSIS — R55 Syncope and collapse: Secondary | ICD-10-CM | POA: Diagnosis not present

## 2014-12-04 NOTE — Progress Notes (Signed)
   Subjective:    Patient ID: Desiree Gutierrez, female    DOB: 22-Jan-1991, 24 y.o.   MRN: [id number]  HPI Patient seen today for ED follow up s/p syncopal episode that occurred 12/02/2014; she has a history of POTS syndrome and history W-P-W s/p ablation in 2011 at [facility].  She is followed by EP Dr. Alfonso Ellis at [facility].   Reports that the episode on Tues 08/30 occurred after lunch. 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 About 30 minutes after lunch, she had syncopal episode. No premonition; no hypoglycemia.  Went to ED. She did fall forward when she lost consciousness, hit abdomen on hard floor. Still a little sore.  ROS: Denies recent fevers or chills, no N/V; mild frontal HA since event. No chest pain or palpitations, no dypsnea or cough.    Review of Systems     Objective:   Physical Exam Generally well appearing, no distress HEENT No ecchymosis in frontal area, nontender.  EOMI. PERRL. Moist mucus membranes. Neck supple, no cervical adenopathy.  COR Regular S1S2 PULM Clear bilaterally, no rales or wheezes       Assessment & Plan:

## 2014-12-04 NOTE — Patient Instructions (Signed)
It was a pleasure to meet you today.   As we discussed, it is important to have you follow up with Electrophysiology.  Please confirm your appointment for later this month with Dr Alfonso Ellis in [facility] Chart. Once you have verified this appointment, please cancel the appointment with Dr Graciela Husbands at [facility].

## 2014-12-04 NOTE — Assessment & Plan Note (Signed)
Patient seen in ED on 12/02/2014 for syncopal episode while at college; history of syncopal episodes, WFW s/p ablation in 2011 at [facility]. Now under the care of electrophysiologist Dr. Alfonso Ellis at [facility]. She was given an appointment with Dr Graciela Husbands for next week, however she prefers to follow with her established EP, with whom she has an appointment scheduled later this month.

## 2014-12-04 NOTE — Progress Notes (Signed)
Patient here to follow up on her syncopal episode and recent trip to the ED Patient reports soreness on her left side from falling  Patient has glucometer on her med list and when asked states she does not check her sugars because she is not a diabetic.  She was told to check due to several hypoglycemic episodes.

## 2014-12-09 ENCOUNTER — Encounter: Payer: Medicaid Other | Admitting: Internal Medicine

## 2014-12-11 ENCOUNTER — Encounter (HOSPITAL_COMMUNITY): Payer: Self-pay

## 2014-12-11 ENCOUNTER — Emergency Department (HOSPITAL_COMMUNITY)
Admission: EM | Admit: 2014-12-11 | Discharge: 2014-12-11 | Disposition: A | Discharge status: Home / Self Care | Payer: Medicaid Other | Attending: Emergency Medicine | Admitting: Emergency Medicine

## 2014-12-11 ENCOUNTER — Telehealth: Payer: Self-pay | Admitting: Family Medicine

## 2014-12-11 ENCOUNTER — Emergency Department (HOSPITAL_COMMUNITY): Payer: Medicaid Other

## 2014-12-11 ENCOUNTER — Encounter: Payer: Self-pay | Admitting: Internal Medicine

## 2014-12-11 DIAGNOSIS — R0789 Other chest pain: Secondary | ICD-10-CM | POA: Insufficient documentation

## 2014-12-11 DIAGNOSIS — Z8679 Personal history of other diseases of the circulatory system: Secondary | ICD-10-CM | POA: Insufficient documentation

## 2014-12-11 DIAGNOSIS — Z79899 Other long term (current) drug therapy: Secondary | ICD-10-CM | POA: Insufficient documentation

## 2014-12-11 DIAGNOSIS — R079 Chest pain, unspecified: Secondary | ICD-10-CM | POA: Diagnosis present

## 2014-12-11 MED ORDER — IBUPROFEN 800 MG PO TABS: 800.0000 mg | ORAL_TABLET | Freq: Three times a day (TID) | ORAL | Status: DC | PRN

## 2014-12-11 NOTE — ED Notes (Signed)
NAD. Pt verbalized understanding of discharge orders and follow up appointment discussed with MD.

## 2014-12-11 NOTE — Telephone Encounter (Signed)
Pt was advised to go to ER for assessment

## 2014-12-11 NOTE — Discharge Instructions (Signed)
Follow up with your md next week as planned °

## 2014-12-11 NOTE — ED Notes (Signed)
Pt presents with burning sensation to L breast x 2 days.  Pt had loop recorder placed x 1 year ago, had syncopal episode x 2 weeks ago (was seen here), reports falling forward onto her chest.  Pt reports device has not been interrogated recently, unsure of swelling or discoloration to site.  Pt reports shortness of breath and diaphoresis today.

## 2014-12-11 NOTE — Telephone Encounter (Signed)
Patient stated that she's having problems with her heart device and believe that it has malfunctioned. Patient Stated that the device is causing severe burning. Please follow up.

## 2014-12-11 NOTE — ED Provider Notes (Signed)
CSN: [id number]     Arrival date & time 12/11/14  1246 History   First MD Initiated Contact with Patient 12/11/14 1313     No chief complaint on file.    (Consider location/radiation/quality/duration/timing/severity/associated sxs/prior Treatment) Patient is a 24 y.o. female presenting with chest pain. The history is provided by the patient (the pt complains of chest wall pain for 2 weeks,  over where she had her left chest.  the pt has a loop recorder).  Chest Pain Pain location:  L chest Pain quality: aching   Pain radiates to:  Does not radiate Pain radiates to the back: no   Pain severity:  Mild Onset quality:  Gradual Timing:  Intermittent Progression:  Waxing and waning Chronicity:  New Context: breathing   Associated symptoms: no abdominal pain, no back pain, no cough, no fatigue and no headache     Past Medical History  Diagnosis Date  . WPW (Wolff-Parkinson-White syndrome)     a. s/p ablation (2011) Dr Thomasenia Sales at [facility] (posterior lateral pathway)  . Vasovagal syncope     a. positive tilt 2011 b. failed medical therapy with Florinef, Midodrine, Inderal, Celexa  . POTS (postural orthostatic tachycardia syndrome)   . Palpitations     a. s/p MDT ILR implanted by Dr Gelene Mink [facility])  . Headache   . Syncope     recurrent, daily   Past Surgical History  Procedure Laterality Date  . Cardiac electrophysiology study and ablation  2011    Dr. Thomasenia Sales at [facility]- left posterior pathway ablation for WPW  . Tilt table study  2011    neurally mediated syncope  . Loop recorder implant  01/22/2014    MDT LINQ implanted by Dr Gelene Mink at [facility] for evaluation of palpitations/syncope   Family History  Problem Relation Age of Onset  . Diabetes Mother   . Hypertension Mother   . Stroke Mother   . Hypertension Brother    Social History  Substance Use Topics  . Smoking status: Never Smoker   . Smokeless tobacco: None  . Alcohol Use: No   OB History    No data available      Review of Systems  Constitutional: Negative for appetite change and fatigue.  HENT: Negative for congestion, ear discharge and sinus pressure.   Eyes: Negative for discharge.  Respiratory: Negative for cough.   Cardiovascular: Positive for chest pain.  Gastrointestinal: Negative for abdominal pain and diarrhea.  Genitourinary: Negative for frequency and hematuria.  Musculoskeletal: Negative for back pain.  Skin: Negative for rash.  Neurological: Negative for seizures and headaches.  Psychiatric/Behavioral: Negative for hallucinations.      Allergies  Amitiza; Ketorolac; Metoclopramide; Midodrine; and Fludrocortisone  Home Medications   Prior to Admission medications   Medication Sig Start Date End Date Taking? Authorizing Provider  cyanocobalamin 1000 MCG tablet Take 100 mcg by mouth daily.   Yes Historical Provider, MD  lactobacillus acidophilus (BACID) TABS tablet Take 2 tablets by mouth daily.   Yes Historical Provider, MD  magnesium citrate SOLN Take 1 Bottle by mouth once.   Yes Historical Provider, MD  Blood Glucose Monitoring Suppl (TRUE METRIX METER) W/DEVICE KIT 1 each by Does not apply route as needed. Patient not taking: Reported on 11/27/2014 11/06/14   Boykin Nearing, MD  ferrous sulfate 325 (65 FE) MG tablet Take 1 tablet (325 mg total) by mouth daily with breakfast. Patient not taking: Reported on 12/11/2014 10/17/14   Ejiroghene Arlyce Dice, MD  glucose blood (TRUE  METRIX BLOOD GLUCOSE TEST) test strip 1 each by Other route 3 (three) times daily. Patient not taking: Reported on 11/27/2014 11/06/14   Boykin Nearing, MD  ibuprofen (ADVIL,MOTRIN) 800 MG tablet Take 1 tablet (800 mg total) by mouth every 8 (eight) hours as needed. 12/11/14   Milton Ferguson, MD  TRUEPLUS LANCETS 28G MISC 1 each by Does not apply route 3 (three) times daily. Patient not taking: Reported on 11/27/2014 11/06/14   Josalyn Funches, MD   BP 116/80 mmHg  Pulse 115  Temp(Src) 99.3 F (37.4 C) (Oral)   Resp 18  Ht _0  (1.6 m)  Wt 137 lb 8 oz (62.37 kg)  BMI 24.36 kg/m2  SpO2 99%  LMP 11/25/2014 Physical Exam  Constitutional: She is oriented to person, place, and time. She appears well-developed.  HENT:  Head: Normocephalic.  Eyes: Conjunctivae and EOM are normal. No scleral icterus.  Neck: Neck supple. No thyromegaly present.  Cardiovascular: Normal rate and regular rhythm.  Exam reveals no gallop and no friction rub.   No murmur heard. Pulmonary/Chest: No stridor. She has no wheezes. She has no rales. She exhibits tenderness.  Mild tenderness over loop recorder  Abdominal: She exhibits no distension. There is no tenderness. There is no rebound.  Musculoskeletal: Normal range of motion. She exhibits no edema.  Lymphadenopathy:    She has no cervical adenopathy.  Neurological: She is oriented to person, place, and time. She exhibits normal muscle tone. Coordination normal.  Skin: No rash noted. No erythema.  Psychiatric: She has a normal mood and affect. Her behavior is normal.    ED Course  Procedures (including critical care time) Labs Review Labs Reviewed - No data to display  Imaging Review Dg Chest [facility] 1 View  12/11/2014   CLINICAL DATA:  Shortness of breath.  Recent fall.  EXAM: PORTABLE CHEST - 1 VIEW  COMPARISON:  November 27, 2014  FINDINGS: Lungs are clear. Heart size and pulmonary vascularity are normal. No adenopathy. No pneumothorax. No bone lesions.  IMPRESSION: No abnormality noted.   Electronically Signed   By: Lowella Grip III M.D.   On: 12/11/2014 14:03   I have personally reviewed and evaluated these images and lab results as part of my medical decision-making.   EKG Interpretation   Date/Time:  Thursday December 11 2014 13:00:32 EDT Ventricular Rate:  90 PR Interval:  116 QRS Duration: 86 QT Interval:  342 QTC Calculation: 418 R Axis:   78 Text Interpretation:  Normal sinus rhythm Normal ECG Confirmed by Kejuan Bekker   MD, Lounette Sloan [phone]) on 12/11/2014  1:22:17 PM      MDM   Final diagnoses:  Chest wall pain   Chest wall pain,  I spoke with her cardiologist at [facility] and he stated to have her follow up next week with him    Milton Ferguson, MD 12/11/14 1534

## 2014-12-16 ENCOUNTER — Telehealth: Payer: Self-pay | Admitting: *Deleted

## 2014-12-16 NOTE — Telephone Encounter (Signed)
-----   Message from Jaclyn Shaggy, MD sent at 12/11/2014  9:50 PM EDT ----- Please inform the patient CXR is normal. Thank you.

## 2014-12-16 NOTE — Telephone Encounter (Signed)
Date of birth verified by pt CXR results given  Stated has F/U post surgery with Dr Armen Pickup on Monday

## 2014-12-17 ENCOUNTER — Emergency Department (HOSPITAL_COMMUNITY): Payer: Medicaid Other

## 2014-12-17 ENCOUNTER — Encounter (HOSPITAL_COMMUNITY): Payer: Self-pay

## 2014-12-17 ENCOUNTER — Emergency Department (HOSPITAL_COMMUNITY)
Admission: EM | Admit: 2014-12-17 | Discharge: 2014-12-17 | Disposition: A | Discharge status: Home / Self Care | Payer: Medicaid Other | Attending: Emergency Medicine | Admitting: Emergency Medicine

## 2014-12-17 DIAGNOSIS — M542 Cervicalgia: Secondary | ICD-10-CM | POA: Diagnosis not present

## 2014-12-17 DIAGNOSIS — R61 Generalized hyperhidrosis: Secondary | ICD-10-CM | POA: Diagnosis not present

## 2014-12-17 DIAGNOSIS — Z3202 Encounter for pregnancy test, result negative: Secondary | ICD-10-CM | POA: Insufficient documentation

## 2014-12-17 DIAGNOSIS — Z79899 Other long term (current) drug therapy: Secondary | ICD-10-CM | POA: Insufficient documentation

## 2014-12-17 DIAGNOSIS — R55 Syncope and collapse: Secondary | ICD-10-CM | POA: Diagnosis present

## 2014-12-17 DIAGNOSIS — R002 Palpitations: Secondary | ICD-10-CM | POA: Diagnosis not present

## 2014-12-17 LAB — I-STAT TROPONIN, ED: TROPONIN I, POC: 0 ng/mL (ref 0.00–0.08)

## 2014-12-17 LAB — BASIC METABOLIC PANEL
Anion gap: 8 (ref 5–15)
BUN: 8 mg/dL (ref 6–20)
CALCIUM: 9.6 mg/dL (ref 8.9–10.3)
CO2: 24 mmol/L (ref 22–32)
CREATININE: 0.88 mg/dL (ref 0.44–1.00)
Chloride: 108 mmol/L (ref 101–111)
Glucose, Bld: 84 mg/dL (ref 65–99)
Potassium: 3.8 mmol/L (ref 3.5–5.1)
SODIUM: 140 mmol/L (ref 135–145)

## 2014-12-17 LAB — CBC
HCT: 38.3 % (ref 36.0–46.0)
Hemoglobin: 12.3 g/dL (ref 12.0–15.0)
MCH: 28.6 pg (ref 26.0–34.0)
MCHC: 32.1 g/dL (ref 30.0–36.0)
MCV: 89.1 fL (ref 78.0–100.0)
PLATELETS: 208 10*3/uL (ref 150–400)
RBC: 4.3 MIL/uL (ref 3.87–5.11)
RDW: 12.4 % (ref 11.5–15.5)
WBC: 7.2 10*3/uL (ref 4.0–10.5)

## 2014-12-17 LAB — POC URINE PREG, ED: PREG TEST UR: NEGATIVE

## 2014-12-17 LAB — CBG MONITORING, ED: GLUCOSE-CAPILLARY: 114 mg/dL — AB (ref 65–99)

## 2014-12-17 NOTE — ED Provider Notes (Signed)
CSN: [id number]     Arrival date & time 12/17/14  1536 History   First MD Initiated Contact with Patient 12/17/14 1547     Chief Complaint  Patient presents with  . Loss of Consciousness     (Consider location/radiation/quality/duration/timing/severity/associated sxs/prior Treatment) Patient is a 24 y.o. female presenting with syncope.  Loss of Consciousness Episode history:  Single Most recent episode:  Today Duration: unknown. Timing:  Constant Progression:  Resolved Chronicity:  New Context comment:  Walking Witnessed: no   Relieved by:  None tried Worsened by:  Nothing tried Ineffective treatments:  None tried Associated symptoms: diaphoresis, nausea and palpitations   Associated symptoms: no chest pain, no fever, no headaches, no shortness of breath and no vomiting   Risk factors comment:  Wpw   Past Medical History  Diagnosis Date  . WPW (Wolff-Parkinson-White syndrome)     a. s/p ablation (2011) Dr Gevena Cotton at [facility] (posterior lateral pathway)  . Vasovagal syncope     a. positive tilt 2011 b. failed medical therapy with Florinef, Midodrine, Inderal, Celexa  . POTS (postural orthostatic tachycardia syndrome)   . Palpitations     a. s/p MDT ILR implanted by Dr Alfonso Ellis [facility])  . Headache   . Syncope     recurrent, daily   Past Surgical History  Procedure Laterality Date  . Cardiac electrophysiology study and ablation  2011    Dr. Gevena Cotton at [facility]- left posterior pathway ablation for WPW  . Tilt table study  2011    neurally mediated syncope  . Loop recorder implant  01/22/2014    MDT LINQ implanted by Dr Alfonso Ellis at [facility] for evaluation of palpitations/syncope   Family History  Problem Relation Age of Onset  . Diabetes Mother   . Hypertension Mother   . Stroke Mother   . Hypertension Brother    Social History  Substance Use Topics  . Smoking status: Never Smoker   . Smokeless tobacco: None  . Alcohol Use: No   OB History    No data available      Review of Systems  Constitutional: Positive for diaphoresis. Negative for fever and chills.  HENT: Negative for congestion and sore throat.   Eyes: Negative for visual disturbance.  Respiratory: Negative for cough, shortness of breath and wheezing.   Cardiovascular: Positive for palpitations and syncope. Negative for chest pain.  Gastrointestinal: Positive for nausea. Negative for vomiting, abdominal pain, diarrhea and constipation.  Genitourinary: Negative for dysuria, difficulty urinating and vaginal pain.  Musculoskeletal: Positive for neck pain.  Skin: Negative for rash.  Neurological: Positive for syncope. Negative for headaches.  Psychiatric/Behavioral: Negative for behavioral problems.  All other systems reviewed and are negative.     Allergies  Amitiza; Ketorolac; Metoclopramide; Midodrine; and Fludrocortisone  Home Medications   Prior to Admission medications   Medication Sig Start Date End Date Taking? Authorizing Provider  Calcium-Magnesium-Vitamin D (CALCIUM MAGNESIUM PO) Take 2 tablets by mouth daily.   Yes Historical Provider, MD  Cholecalciferol (D-5000) 5000 UNITS TABS Take 5,000 Units by mouth daily.   Yes Historical Provider, MD  Cyanocobalamin (RA VITAMIN B-12 TR) 1000 MCG TBCR Take 1,000 mcg by mouth daily.   Yes Historical Provider, MD  cyanocobalamin 1000 MCG tablet Take 100 mcg by mouth daily.   Yes Historical Provider, MD  ibuprofen (ADVIL,MOTRIN) 800 MG tablet Take 1 tablet (800 mg total) by mouth every 8 (eight) hours as needed. 12/11/14  Yes Bethann Berkshire, MD  IRON PO Take 325 mg  by mouth daily.   Yes Historical Provider, MD  lactobacillus acidophilus (BACID) TABS tablet Take 2 tablets by mouth daily.   Yes Historical Provider, MD  magnesium citrate SOLN Take 1 Bottle by mouth once.   Yes Historical Provider, MD  Probiotic Product (PROBIOTIC PO) Take 1 tablet by mouth daily.   Yes Historical Provider, MD  sodium chloride 1 G tablet Take 1 g by mouth 2  (two) times daily.   Yes Historical Provider, MD   BP 105/61 mmHg  Pulse 95  Temp(Src) 99 F (37.2 C) (Oral)  Resp 16  SpO2 100%  LMP 11/25/2014 Physical Exam  Constitutional: She is oriented to person, place, and time. She appears well-developed and well-nourished. No distress. Cervical collar in place.  HENT:  Head: Normocephalic and atraumatic.  Eyes: EOM are normal.  Neck: Normal range of motion.  Cardiovascular: Normal rate, regular rhythm and normal heart sounds.   No murmur heard. Pulmonary/Chest: Effort normal and breath sounds normal. No respiratory distress. She has no wheezes.  Abdominal: Soft. There is no tenderness.  Musculoskeletal: She exhibits no edema.       Cervical back: She exhibits tenderness and bony tenderness.  Neurological: She is alert and oriented to person, place, and time. She has normal strength and normal reflexes. No cranial nerve deficit or sensory deficit. Coordination normal. GCS eye subscore is 4. GCS verbal subscore is 5. GCS motor subscore is 6.  Skin: She is not diaphoretic.  Psychiatric: She has a normal mood and affect. Her behavior is normal.    ED Course  Procedures (including critical care time) Labs Review Labs Reviewed  CBG MONITORING, ED - Abnormal; Notable for the following:    Glucose-Capillary 114 (*)    All other components within normal limits  BASIC METABOLIC PANEL  CBC  POC URINE PREG, ED  I-STAT TROPOININ, ED    Imaging Review Dg Cervical Spine Complete  12/17/2014   CLINICAL DATA:  Posterior and right neck pain after syncopal episode with fall.  EXAM: CERVICAL SPINE  4+ VIEWS  COMPARISON:  None.  FINDINGS: There is no evidence of cervical spine fracture or prevertebral soft tissue swelling. Alignment is normal. No other significant bone abnormalities are identified.  IMPRESSION: Negative cervical spine radiographs.   Electronically Signed   By: Elberta Fortis M.D.   On: 12/17/2014 17:55   I have personally reviewed and  evaluated these images and lab results as part of my medical decision-making.   EKG Interpretation   Date/Time:  Wednesday December 17 2014 15:55:34 EDT Ventricular Rate:  102 PR Interval:  115 QRS Duration: 74 QT Interval:  319 QTC Calculation: 415 R Axis:   77 Text Interpretation:  Sinus tachycardia Nonspecific T abnormalities,  inferior leads No significant change since last tracing Confirmed by  Anitra Lauth  MD, Alphonzo Lemmings ([id number]) on 12/17/2014 6:05:45 PM      MDM   Final diagnoses:  Syncope and collapse     Patient is a 24 year old female with a history of WPW status post ablation 2011, POTS, Loop recorder removed on Friday that presents with syncope and collapse and midline and right-sided neck pain. Patient states that she was walking around when she began to have palpitations, diaphoresis, nausea and fell herself passing out but was unable to catch herself. Patient thinks that she was out for approximately 2 minutes. On chart review patient's had 14 episodes since May of very similar presentation. We will get screening labs, EKG, imaging of her neck.  Imaging of neck is negative. Patient has been normotensive and normal heart rate on telemetry. Patient's c-collar was cleared and discharged with cardiology follow-up.  Beverely Risen, MD 12/17/14 1610  Gwyneth Sprout, MD 12/18/14 [phone]

## 2014-12-17 NOTE — Care Management (Signed)
ED CM noted patient to have had 20 ED and 2 admissions in the past 6 months. Patient goes to the [facility], and is followed by [facility] CM. ED CM will have [facility] CM [phone] up with patient. No further ED CM needs identified.

## 2014-12-17 NOTE — ED Notes (Signed)
Pt aware of urine sample needed,  

## 2014-12-17 NOTE — Discharge Instructions (Signed)
Near-Syncope Near-syncope (commonly known as near fainting) is sudden weakness, dizziness, or feeling like you might pass out. During an episode of near-syncope, you may also develop pale skin, have tunnel vision, or feel sick to your stomach (nauseous). Near-syncope may occur when getting up after sitting or while standing for a long time. It is caused by a sudden decrease in blood flow to the brain. This decrease can result from various causes or triggers, most of which are not serious. However, because near-syncope can sometimes be a sign of something serious, a medical evaluation is required. The specific cause is often not determined. HOME CARE INSTRUCTIONS  Monitor your condition for any changes. The following actions may help to alleviate any discomfort you are experiencing:  Have someone stay with you until you feel stable.  Lie down right away and prop your feet up if you start feeling like you might faint. Breathe deeply and steadily. Wait until all the symptoms have passed. Most of these episodes last only a few minutes. You may feel tired for several hours.   Drink enough fluids to keep your urine clear or pale yellow.   If you are taking blood pressure or heart medicine, get up slowly when seated or lying down. Take several minutes to sit and then stand. This can reduce dizziness.  Follow up with your health care provider as directed. SEEK IMMEDIATE MEDICAL CARE IF:   You have a severe headache.   You have unusual pain in the chest, abdomen, or back.   You are bleeding from the mouth or rectum, or you have black or tarry stool.   You have an irregular or very fast heartbeat.   You have repeated fainting or have seizure-like jerking during an episode.   You faint when sitting or lying down.   You have confusion.   You have difficulty walking.   You have severe weakness.   You have vision problems.  MAKE SURE YOU:   Understand these instructions.  Will  watch your condition.  Will get help right away if you are not doing well or get worse. Document Released: 03/21/2005 Document Revised: 03/26/2013 Document Reviewed: 08/24/2012 ExitCare Patient Information 2015 [employer]. This information is not intended to replace advice given to you by your health care provider. Make sure you discuss any questions you have with your health care provider.  

## 2014-12-17 NOTE — ED Notes (Signed)
Pt CBG 114 

## 2014-12-17 NOTE — ED Notes (Signed)
C-collar removed by MD

## 2014-12-17 NOTE — ED Notes (Signed)
Patient transported to X-ray 

## 2014-12-17 NOTE — ED Notes (Signed)
PER EMS: pt from home, was found unconscious laying on the floor by a man who was working on her A/C; he picked her up and laid her on the couch and she regained consciousness. Pt reports the air conditioner at her home has been out for 3 weeks. Unwitnessed syncopal episode. Upon EMS arrival pt was A&Ox4 with memory of it being very hot in her apartment and remembers waking up on the couch. She reports neck pain but denies back pain. Hx of WPW and POTS. Pt also reporting a heaviness in her chest but recently had her LOOP recorder removed on Friday and had similar feeling on Friday after it was removed.

## 2014-12-22 ENCOUNTER — Encounter: Payer: Self-pay | Admitting: Family Medicine

## 2014-12-22 ENCOUNTER — Ambulatory Visit: Payer: Medicaid Other | Attending: Family Medicine | Admitting: Family Medicine

## 2014-12-22 VITALS — BP 104/72 | HR 92 | Temp 99.0°F | Resp 16 | Ht 63.0 in | Wt 137.0 lb

## 2014-12-22 DIAGNOSIS — E162 Hypoglycemia, unspecified: Secondary | ICD-10-CM | POA: Diagnosis not present

## 2014-12-22 DIAGNOSIS — D509 Iron deficiency anemia, unspecified: Secondary | ICD-10-CM | POA: Diagnosis not present

## 2014-12-22 DIAGNOSIS — R000 Tachycardia, unspecified: Secondary | ICD-10-CM | POA: Diagnosis not present

## 2014-12-22 DIAGNOSIS — I456 Pre-excitation syndrome: Secondary | ICD-10-CM | POA: Insufficient documentation

## 2014-12-22 DIAGNOSIS — I951 Orthostatic hypotension: Secondary | ICD-10-CM | POA: Diagnosis not present

## 2014-12-22 DIAGNOSIS — G90A Postural orthostatic tachycardia syndrome (POTS): Secondary | ICD-10-CM

## 2014-12-22 LAB — GLUCOSE, POCT (MANUAL RESULT ENTRY): POC GLUCOSE: 75 mg/dL (ref 70–99)

## 2014-12-22 NOTE — Patient Instructions (Signed)
  Charlesa,  Thank you for coming in for f/u today  Checking iron levels today Eat regular meals and 1-2 snacks per day to prevent blood sugar < 70  F/u with me in 2 months   Dr. Armen Pickup

## 2014-12-22 NOTE — Progress Notes (Signed)
Patient ID: Desiree Gutierrez, female   DOB: Feb 28, 1991, 24 y.o.   MRN: [id number]   Subjective:  Patient ID: Desiree Gutierrez, female    DOB: 1990/04/28  Age: 24 y.o. MRN: [id number]  CC: Procedure  HPI Desiree Gutierrez presents for f/u syncope. She has been diagnosed with WPW and Vasovagal syncope. She is followed closely by her electrophysiologist and neurologist. Her last syncopal episode this AM while at [facility]. She gets palpitations that are often associated with syncopal episodes. Heat is also a trigger. Patient would like to continue school but is fearful of fainting. She has considered using a wheelchair while at school but refuses this at the moment.   Outpatient Prescriptions Prior to Visit  Medication Sig Dispense Refill  . Calcium-Magnesium-Vitamin D (CALCIUM MAGNESIUM PO) Take 2 tablets by mouth daily.    . Cholecalciferol (D-5000) 5000 UNITS TABS Take 5,000 Units by mouth daily.    . Cyanocobalamin (RA VITAMIN B-12 TR) 1000 MCG TBCR Take 1,000 mcg by mouth daily.    . cyanocobalamin 1000 MCG tablet Take 100 mcg by mouth daily.    Marland Kitchen ibuprofen (ADVIL,MOTRIN) 800 MG tablet Take 1 tablet (800 mg total) by mouth every 8 (eight) hours as needed. 20 tablet 0  . IRON PO Take 325 mg by mouth daily.    Marland Kitchen lactobacillus acidophilus (BACID) TABS tablet Take 2 tablets by mouth daily.    . magnesium citrate SOLN Take 1 Bottle by mouth once.    . Probiotic Product (PROBIOTIC PO) Take 1 tablet by mouth daily.    . sodium chloride 1 G tablet Take 1 g by mouth 2 (two) times daily.     No facility-administered medications prior to visit.   Social History  Substance Use Topics  . Smoking status: Never Smoker   . Smokeless tobacco: Not on file  . Alcohol Use: No   ROS Review of Systems  Constitutional: Negative for fever and chills.  Eyes: Negative for visual disturbance.  Respiratory: Negative for shortness of breath.   Cardiovascular: Negative for chest pain.  Gastrointestinal: Negative for  abdominal pain and blood in stool.  Musculoskeletal: Negative for back pain and arthralgias.  Skin: Negative for rash.  Allergic/Immunologic: Negative for immunocompromised state.  Neurological: Positive for dizziness and syncope.  Hematological: Negative for adenopathy. Does not bruise/bleed easily.  Psychiatric/Behavioral: Negative for suicidal ideas and dysphoric mood.    Objective:  BP 104/72 mmHg  Pulse 92  Temp(Src) 99 F (37.2 C) (Oral)  Resp 16  Ht  (1.6 m)  Wt 137 lb (62.143 kg)  BMI 24.27 kg/m2  SpO2 96%  LMP 11/25/2014  BP/Weight 12/22/2014 12/17/2014 12/11/2014  Systolic BP 104 101 113  Diastolic BP 72 54 74  Wt. (Lbs) 137 - 137.5  BMI 24.27 - 24.36   Physical Exam  Constitutional: She is oriented to person, place, and time. She appears well-developed and well-nourished. No distress.  HENT:  Head: Normocephalic and atraumatic.  Cardiovascular: Normal rate and regular rhythm.   Pulmonary/Chest: Effort normal and breath sounds normal.  Musculoskeletal: She exhibits no edema.  Neurological: She is alert and oriented to person, place, and time.  Skin: Skin is warm and dry. No rash noted.  Psychiatric: She has a normal mood and affect.   CBG 75  Assessment & Plan:   Problem List Items Addressed This Visit    Hypoglycemia - Primary (Chronic)   Relevant Orders   Glucose (CBG) (Completed)   Iron deficiency anemia   Relevant Orders  Iron and TIBC   Ferritin   POTS (postural orthostatic tachycardia syndrome) (Chronic)   WOLFF-PARKINSON-WHITE (WPW) SYNDROME status post ablation (Chronic)      No orders of the defined types were placed in this encounter.    Follow-up: No Follow-up on file.   [name] MD

## 2014-12-22 NOTE — Progress Notes (Signed)
F/U post surgery  Stated pass out today.hit head  Pain scale

## 2014-12-23 ENCOUNTER — Telehealth: Payer: Self-pay | Admitting: *Deleted

## 2014-12-23 LAB — IRON AND TIBC
%SAT: 23 % (ref 11–50)
Iron: 95 ug/dL (ref 40–190)
TIBC: 411 ug/dL (ref 250–450)
UIBC: 316 ug/dL (ref 125–400)

## 2014-12-23 LAB — FERRITIN: Ferritin: 20 ng/mL (ref 10–291)

## 2014-12-23 NOTE — Telephone Encounter (Signed)
Date of birth verified by pt Lab result given to pt-Iron level normal Pt verbalized understanding

## 2014-12-23 NOTE — Telephone Encounter (Signed)
-----   Message from [name], MD sent at 12/23/2014  9:21 AM EDT ----- Normal iron studies

## 2014-12-30 ENCOUNTER — Emergency Department (HOSPITAL_COMMUNITY): Payer: Medicaid Other

## 2014-12-30 ENCOUNTER — Emergency Department (HOSPITAL_COMMUNITY)
Admission: EM | Admit: 2014-12-30 | Discharge: 2014-12-30 | Disposition: A | Discharge status: Home / Self Care | Payer: Medicaid Other | Attending: Emergency Medicine | Admitting: Emergency Medicine

## 2014-12-30 ENCOUNTER — Encounter (HOSPITAL_COMMUNITY): Payer: Self-pay | Admitting: Emergency Medicine

## 2014-12-30 DIAGNOSIS — S0993XA Unspecified injury of face, initial encounter: Secondary | ICD-10-CM | POA: Diagnosis not present

## 2014-12-30 DIAGNOSIS — R55 Syncope and collapse: Secondary | ICD-10-CM | POA: Diagnosis not present

## 2014-12-30 DIAGNOSIS — Y9289 Other specified places as the place of occurrence of the external cause: Secondary | ICD-10-CM | POA: Diagnosis not present

## 2014-12-30 DIAGNOSIS — Y9389 Activity, other specified: Secondary | ICD-10-CM | POA: Insufficient documentation

## 2014-12-30 DIAGNOSIS — Z8679 Personal history of other diseases of the circulatory system: Secondary | ICD-10-CM | POA: Diagnosis not present

## 2014-12-30 DIAGNOSIS — Y998 Other external cause status: Secondary | ICD-10-CM | POA: Diagnosis not present

## 2014-12-30 DIAGNOSIS — W01198A Fall on same level from slipping, tripping and stumbling with subsequent striking against other object, initial encounter: Secondary | ICD-10-CM | POA: Insufficient documentation

## 2014-12-30 DIAGNOSIS — Z79899 Other long term (current) drug therapy: Secondary | ICD-10-CM | POA: Diagnosis not present

## 2014-12-30 LAB — BASIC METABOLIC PANEL
Anion gap: 9 (ref 5–15)
BUN: 13 mg/dL (ref 6–20)
CALCIUM: 9.6 mg/dL (ref 8.9–10.3)
CHLORIDE: 103 mmol/L (ref 101–111)
CO2: 24 mmol/L (ref 22–32)
CREATININE: 0.86 mg/dL (ref 0.44–1.00)
GFR calc Af Amer: >60 mL/min (ref >60)
GFR calc non Af Amer: >60 mL/min (ref >60)
GLUCOSE: 74 mg/dL (ref 65–99)
Potassium: 4.1 mmol/L (ref 3.5–5.1)
Sodium: 136 mmol/L (ref 135–145)

## 2014-12-30 LAB — CBC
HCT: 37.2 % (ref 36.0–46.0)
Hemoglobin: 12.3 g/dL (ref 12.0–15.0)
MCH: 28.8 pg (ref 26.0–34.0)
MCHC: 33.1 g/dL (ref 30.0–36.0)
MCV: 87.1 fL (ref 78.0–100.0)
PLATELETS: 53 10*3/uL — AB (ref 150–400)
RBC: 4.27 MIL/uL (ref 3.87–5.11)
RDW: 12.1 % (ref 11.5–15.5)
WBC: 5.4 10*3/uL (ref 4.0–10.5)

## 2014-12-30 NOTE — ED Notes (Signed)
Pt in radiology 

## 2014-12-30 NOTE — ED Provider Notes (Signed)
5:58 PM 23yF with syncope. W/u fairly unremarkable. Feeling better but continues to feel palpitations and doesn't feel comfortable with discharge. Has remained on monitor and no significant abnormalities noted which would necessarily explain her continued symptoms. Tried to reassure. Has had frequent evaluation for syncope an near syncope through [facility] system as well as elsewhere.  Followed by EP, Dr Alfonso Ellis at [facility]. At this time will continue to observe a little longer, but anticipate ultimate DC.   7:18 PM No events noted. Not quite back to baseline, but I feel stable for DC at this time.   Raeford Razor, MD 12/30/14 [phone]

## 2014-12-30 NOTE — ED Notes (Signed)
MD Kohut notified of patient complaints during walking, pt also reports intermittent palpitations, no changes on EKG noted. MD aware and states he will be to bedside soon to speak with patient about plan.

## 2014-12-30 NOTE — ED Provider Notes (Signed)
CSN: [id number]     Arrival date & time 12/30/14  1335 History   First MD Initiated Contact with Patient 12/30/14 1355     Chief Complaint  Patient presents with  . Loss of Consciousness     (Consider location/radiation/quality/duration/timing/severity/associated sxs/prior Treatment) HPI Desiree Gutierrez is a 24 yo female with h/o WPW s/p ablation and POTS, presenting with syncopal episode following exercise.  She states she had exercised with a trainer to help that helps patients with POTS.  She had no CP or SOB while exercising.  She was then standing, waiting to pay, when the trainer noted she had lost her color.  Patient then fell forward, hitting the side of her face on the desk before falling to the floor.  The trainer was unable to feel a pulse and initiated CPR.  The patient then awoke spontaneously.  She endorses nausea after she awoke.  She endorses chest discomfort, but denies CP or SOB.  She currently denies facial pain or HA, but endorses a dull ache of the left face.  She denies loss of bowel/bladder control during the episode.  She had her loop recorder removed approximately 2 weeks ago.  She endorses high salt diet and salt tabs.  Her cardiologist is Dr. Alfonso Gutierrez [facility]).  Past Medical History  Diagnosis Date  . WPW (Wolff-Parkinson-White syndrome)     a. s/p ablation (2011) Dr Desiree Gutierrez at [facility] (posterior lateral pathway)  . Vasovagal syncope     a. positive tilt 2011 b. failed medical therapy with Florinef, Midodrine, Inderal, Celexa  . POTS (postural orthostatic tachycardia syndrome)   . Palpitations     a. s/p MDT ILR implanted by Dr Desiree Gutierrez [facility])  . Headache   . Syncope     recurrent, daily   Past Surgical History  Procedure Laterality Date  . Cardiac electrophysiology study and ablation  2011    Dr. Gevena Gutierrez at [facility]- left posterior pathway ablation for WPW  . Tilt table study  2011    neurally mediated syncope  . Loop recorder implant  01/22/2014    MDT LINQ  implanted by Dr Desiree Gutierrez at [facility] for evaluation of palpitations/syncope   Family History  Problem Relation Age of Onset  . Diabetes Mother   . Hypertension Mother   . Stroke Mother   . Hypertension Brother    Social History  Substance Use Topics  . Smoking status: Never Smoker   . Smokeless tobacco: None  . Alcohol Use: No   OB History    No data available     Review of Systems  Constitutional: Negative for fever and chills.  Respiratory: Negative for shortness of breath.   Cardiovascular: Negative for chest pain.  Gastrointestinal: Negative for abdominal pain.  Neurological: Positive for syncope and light-headedness. Negative for weakness.      Allergies  Amitiza; Ketorolac; Metoclopramide; Midodrine; and Fludrocortisone  Home Medications   Prior to Admission medications   Medication Sig Start Date End Date Taking? Authorizing Provider  Calcium-Magnesium-Vitamin D (CALCIUM MAGNESIUM PO) Take 2 tablets by mouth daily.    Historical Provider, MD  Cholecalciferol (D-5000) 5000 UNITS TABS Take 5,000 Units by mouth daily.    Historical Provider, MD  Cyanocobalamin (RA VITAMIN B-12 TR) 1000 MCG TBCR Take 1,000 mcg by mouth daily.    Historical Provider, MD  ibuprofen (ADVIL,MOTRIN) 800 MG tablet Take 1 tablet (800 mg total) by mouth every 8 (eight) hours as needed. 12/11/14   Bethann Berkshire, MD  IRON PO Take  325 mg by mouth daily.    Historical Provider, MD  lactobacillus acidophilus (BACID) TABS tablet Take 2 tablets by mouth daily.    Historical Provider, MD  magnesium citrate SOLN Take 1 Bottle by mouth once.    Historical Provider, MD  Probiotic Product (PROBIOTIC PO) Take 1 tablet by mouth daily.    Historical Provider, MD  sodium chloride 1 G tablet Take 1 g by mouth 2 (two) times daily.    Historical Provider, MD   BP 113/68 mmHg  Pulse 103  Temp(Src) 98.7 F (37.1 C) (Oral)  Resp 16  SpO2 100% Physical Exam  Constitutional: She is oriented to person, place, and  time. She appears well-developed and well-nourished. She appears distressed.  HENT:  Head: Normocephalic and atraumatic.  Eyes: EOM are normal.  Cardiovascular: Normal rate, regular rhythm and normal heart sounds.   Pulmonary/Chest: Effort normal and breath sounds normal. No respiratory distress. She has no wheezes.  Abdominal: Soft. She exhibits no distension. There is no tenderness. There is no rebound and no guarding.  Musculoskeletal: Normal range of motion. She exhibits no edema.  Neurological: She is alert and oriented to person, place, and time.  Skin: Skin is warm and dry. No rash noted. She is not diaphoretic.    ED Course  Procedures (including critical care time) Labs Review Labs Reviewed  BASIC METABOLIC PANEL  CBC    Imaging Review No results found. I have personally reviewed and evaluated these images and lab results as part of my medical decision-making.   EKG Interpretation   Date/Time:  Tuesday December 30 2014 13:39:21 EDT Ventricular Rate:  97 PR Interval:  122 QRS Duration: 75 QT Interval:  325 QTC Calculation: 413 R Axis:   69 Text Interpretation:  Sinus tachycardia Ventricular premature complex No  significant change since last tracing Confirmed by LITTLE MD, Desiree  ([id number]) on 12/30/2014 1:44:25 PM      MDM   Final diagnoses:  None    Syncope: Symptoms c/w previous ED visits for syncope 2/2 WPW and/or POTS.  ECG negative for arrhythmia. GCS 15 without AMS or signs of basilar skull fracture.  Neuro exam normal; therefore, CT imaging not felt to be warranted at this time.  Screening labs normal.  Complaining of continued dizziness with ambulation.  Patient tolerated PO fluids and food.  Although she did not feel quite back to baseline, she was felt to be stable for discharge with Cardiology followup.   [name], MD 12/31/14 6045  Laurence Spates, MD 01/01/15 [phone]

## 2014-12-30 NOTE — ED Notes (Addendum)
MD at bedside failed attempt at IV; informed no medical necessity for IV at this time; phlebotomy called.

## 2014-12-30 NOTE — Discharge Instructions (Signed)
1. Stay hydrated with lots of fluids 2. Continue salt diet. 3. You need to schedule an appointment with your Cardiologist.

## 2014-12-30 NOTE — ED Notes (Signed)
Pt eating at this time, will re-evaluate after

## 2014-12-30 NOTE — ED Notes (Signed)
Pt placed in gown and in bed. Pt monitored by pulse ox, bp cuff, and 12-lead. 

## 2014-12-30 NOTE — ED Notes (Signed)
Multiple attempts at IV start unsuccessful by this RN. MD notified.

## 2014-12-30 NOTE — ED Notes (Signed)
Pt was able to ambulate in the hallway, pt stated she did not feel dizzy anymore, but she still felt a little weak while walking.

## 2014-12-30 NOTE — ED Notes (Signed)
Pt ambulated in hallway good. Pt stated she started to feel light headed half way through the walk and started to feel weak.

## 2014-12-30 NOTE — ED Notes (Signed)
MD at bedside. 

## 2014-12-30 NOTE — ED Notes (Signed)
Pt with sign heart hx; was at the gym. Trainer said her color did not look good; syncopized in gym when leaving and compressions started. She woke up and told them to stop. Complaining of chest pain. Became dizzy when standing per EMS.

## 2015-01-01 ENCOUNTER — Telehealth: Payer: Self-pay | Admitting: Family Medicine

## 2015-01-01 DIAGNOSIS — I951 Orthostatic hypotension: Secondary | ICD-10-CM

## 2015-01-01 DIAGNOSIS — R000 Tachycardia, unspecified: Secondary | ICD-10-CM

## 2015-01-01 DIAGNOSIS — I456 Pre-excitation syndrome: Secondary | ICD-10-CM

## 2015-01-01 DIAGNOSIS — R0789 Other chest pain: Secondary | ICD-10-CM

## 2015-01-01 DIAGNOSIS — G90A Postural orthostatic tachycardia syndrome (POTS): Secondary | ICD-10-CM

## 2015-01-01 NOTE — Telephone Encounter (Signed)
Done  Sent Referral to Dr Sandy Salaam ,They will contact the patient to schedule an appointment . Patient is aware .

## 2015-01-01 NOTE — Telephone Encounter (Signed)
Referral placed. Please inform patient.

## 2015-01-01 NOTE — Telephone Encounter (Signed)
Patient presented to office today with her mother for her mother's appointment She reports syncope 4 days ago that required CPR She is having central CP that radiates to the back. Pain is 5/10 now. Pain is sometimes 9/10. Taking ibuprofen for pain.  Has f/u with EP with EP in December Took a leave of absence from school due to worsening syncope.  Plan: ECHO ordered Recommended closer f/u with EP, informed patient that I would call her EP at [facility] tomorrow to facilitate a sooner f/u.

## 2015-01-01 NOTE — Telephone Encounter (Signed)
Patient is needing a referral to be seen by her cardiologist who specializes in electro-physiology. The information of the cardiologist is Sandy Salaam, MD,FACC,FHRS, Director, EP, Laboratory and he is located at [facility]. Please follow up with pt if this referral can be provided without having an appt. Dr Armen Pickup recommended that she see this cardiologist. Please follow up with pt. Thank you.

## 2015-01-02 NOTE — Telephone Encounter (Signed)
Patient's appointment with Dr. Alfonso Ellis moved from 03/19/15  To 03/12/2015 with the fellow Dr. Frederich Balding at 1 PM [facility] 7th floor   This was the earliest available at the moment, however they will look into sooner appointments.   Please inform patient.

## 2015-01-03 ENCOUNTER — Emergency Department (HOSPITAL_COMMUNITY)
Admission: EM | Admit: 2015-01-03 | Discharge: 2015-01-03 | Disposition: A | Discharge status: Home / Self Care | Payer: Medicaid Other | Attending: Emergency Medicine | Admitting: Emergency Medicine

## 2015-01-03 ENCOUNTER — Encounter (HOSPITAL_COMMUNITY): Payer: Self-pay | Admitting: Vascular Surgery

## 2015-01-03 DIAGNOSIS — Z79899 Other long term (current) drug therapy: Secondary | ICD-10-CM | POA: Diagnosis not present

## 2015-01-03 DIAGNOSIS — Z3202 Encounter for pregnancy test, result negative: Secondary | ICD-10-CM | POA: Insufficient documentation

## 2015-01-03 DIAGNOSIS — R000 Tachycardia, unspecified: Secondary | ICD-10-CM | POA: Insufficient documentation

## 2015-01-03 DIAGNOSIS — Z8679 Personal history of other diseases of the circulatory system: Secondary | ICD-10-CM | POA: Insufficient documentation

## 2015-01-03 DIAGNOSIS — R55 Syncope and collapse: Secondary | ICD-10-CM | POA: Diagnosis present

## 2015-01-03 LAB — POC URINE PREG, ED: Preg Test, Ur: NEGATIVE

## 2015-01-03 NOTE — Discharge Instructions (Signed)
Syncope °Syncope means a person passes out (faints). The person usually wakes up in less than 5 minutes. It is important to seek medical care for syncope. °HOME CARE °· Have someone stay with you until you feel normal. °· Do not drive, use machines, or play sports until your doctor says it is okay. °· Keep all doctor visits as told. °· Lie down when you feel like you might pass out. Take deep breaths. Wait until you feel normal before standing up. °· Drink enough fluids to keep your pee (urine) clear or pale yellow. °· If you take blood pressure or heart medicine, get up slowly. Take several minutes to sit and then stand. °GET HELP RIGHT AWAY IF:  °· You have a severe headache. °· You have pain in the chest, belly (abdomen), or back. °· You are bleeding from the mouth or butt (rectum). °· You have black or tarry poop (stool). °· You have an irregular or very fast heartbeat. °· You have pain with breathing. °· You keep passing out, or you have shaking (seizures) when you pass out. °· You pass out when sitting or lying down. °· You feel confused. °· You have trouble walking. °· You have severe weakness. °· You have vision problems. °If you fainted, call your local emergency services (911 in [place] Do not drive yourself to the hospital. °MAKE SURE YOU:  °· Understand these instructions. °· Will watch your condition. °· Will get help right away if you are not doing well or get worse. °Document Released: 09/07/2007 Document Revised: 09/20/2011 Document Reviewed: 05/20/2011 °ExitCare® Patient Information ©2015 [employer]. This information is not intended to replace advice given to you by your health care provider. Make sure you discuss any questions you have with your health care provider. ° °

## 2015-01-03 NOTE — ED Notes (Signed)
Pt reports to the ED for eval of syncopal episode. She has hx of POTS and WPW and this is her 2nd syncopal episode this week. Larey Seat and struck the right side of her face. No swelling, obvious deformity, or abrasion. Denies any CP. She is supposed to get an ablation evetually but unsure when she will get it done. Pt A&OX4, resp e/u, and skin warm and dry.

## 2015-01-03 NOTE — ED Provider Notes (Signed)
CSN: [id number]     Arrival date & time 01/03/15  1235 History   First MD Initiated Contact with Patient 01/03/15 1239     Chief Complaint  Patient presents with  . Loss of Consciousness     (Consider location/radiation/quality/duration/timing/severity/associated sxs/prior Treatment) Patient is a 24 y.o. female presenting with syncope. The history is provided by the patient.  Loss of Consciousness Episode history:  Single Most recent episode:  Today Duration:  2 seconds Timing:  Constant Progression:  Resolved Chronicity:  Chronic Context: standing up   Witnessed: yes   Relieved by:  Lying down Worsened by:  Nothing tried Ineffective treatments:  None tried Associated symptoms: recent fall   Associated symptoms: no anxiety, no chest pain, no dizziness, no fever, no headaches, no nausea, no palpitations, no shortness of breath and no vomiting    24 yo F with a chief complaint of syncope. This is a recurrent issue for this patient as she's been diagnosed with POTS as well as WPW.  Patient states that this episode she was standing and talking to a friend and then she also dropped to the ground. This event lasted couple seconds. Patient fell forward and hit the right side of her face. Patient has a little bit of tenderness there denies any nausea or vomiting. Is currently a GCS 15.   Past Medical History  Diagnosis Date  . WPW (Wolff-Parkinson-White syndrome)     a. s/p ablation (2011) Dr Gevena Cotton at [facility] (posterior lateral pathway)  . Vasovagal syncope     a. positive tilt 2011 b. failed medical therapy with Florinef, Midodrine, Inderal, Celexa  . POTS (postural orthostatic tachycardia syndrome)   . Palpitations     a. s/p MDT ILR implanted by Dr Alfonso Ellis [facility])  . Headache   . Syncope     recurrent, daily   Past Surgical History  Procedure Laterality Date  . Cardiac electrophysiology study and ablation  2011    Dr. Gevena Cotton at [facility]- left posterior pathway ablation for WPW   . Tilt table study  2011    neurally mediated syncope  . Loop recorder implant  01/22/2014    MDT LINQ implanted by Dr Alfonso Ellis at [facility] for evaluation of palpitations/syncope   Family History  Problem Relation Age of Onset  . Diabetes Mother   . Hypertension Mother   . Stroke Mother   . Hypertension Brother    Social History  Substance Use Topics  . Smoking status: Never Smoker   . Smokeless tobacco: None  . Alcohol Use: No   OB History    No data available     Review of Systems  Constitutional: Negative for fever and chills.  HENT: Negative for congestion and rhinorrhea.   Eyes: Negative for redness and visual disturbance.  Respiratory: Negative for shortness of breath and wheezing.   Cardiovascular: Positive for syncope. Negative for chest pain and palpitations.  Gastrointestinal: Negative for nausea and vomiting.  Genitourinary: Negative for dysuria and urgency.  Musculoskeletal: Negative for myalgias and arthralgias.  Skin: Negative for pallor and wound.  Neurological: Positive for syncope. Negative for dizziness and headaches.      Allergies  Amitiza; Ketorolac; Metoclopramide; Midodrine; and Fludrocortisone  Home Medications   Prior to Admission medications   Medication Sig Start Date End Date Taking? Authorizing Provider  Calcium-Magnesium-Vitamin D (CALCIUM MAGNESIUM PO) Take 2 tablets by mouth daily.    Historical Provider, MD  Cholecalciferol (D-5000) 5000 UNITS TABS Take 5,000 Units by mouth daily.  Historical Provider, MD  Cyanocobalamin (RA VITAMIN B-12 TR) 1000 MCG TBCR Take 1,000 mcg by mouth daily.    Historical Provider, MD  ibuprofen (ADVIL,MOTRIN) 800 MG tablet Take 1 tablet (800 mg total) by mouth every 8 (eight) hours as needed. Patient not taking: Reported on 12/30/2014 12/11/14   Bethann Berkshire, MD  IRON PO Take 325 mg by mouth daily.    Historical Provider, MD  Probiotic Product (PROBIOTIC PO) Take 1 tablet by mouth daily.    Historical  Provider, MD  sodium chloride 1 G tablet Take 1 g by mouth 2 (two) times daily.    Historical Provider, MD   BP 106/59 mmHg  Pulse 83  Temp(Src) 99.1 F (37.3 C) (Oral)  Resp 16  SpO2 100%  LMP 12/25/2014 Physical Exam  Constitutional: She is oriented to person, place, and time. She appears well-developed and well-nourished. No distress.  HENT:  Head: Normocephalic and atraumatic.  Eyes: EOM are normal. Pupils are equal, round, and reactive to light.  Neck: Normal range of motion. Neck supple.  Cardiovascular: Normal rate and regular rhythm.  Exam reveals no gallop and no friction rub.   No murmur heard. Pulmonary/Chest: Effort normal. She has no wheezes. She has no rales.  Abdominal: Soft. She exhibits no distension. There is no tenderness. There is no rebound.  Musculoskeletal: She exhibits no edema or tenderness.  Neurological: She is alert and oriented to person, place, and time.  Skin: Skin is warm and dry. She is not diaphoretic.  Psychiatric: She has a normal mood and affect. Her behavior is normal.    ED Course  Procedures (including critical care time) Labs Review Labs Reviewed  POC URINE PREG, ED    Imaging Review No results found. I have personally reviewed and evaluated these images and lab results as part of my medical decision-making.   EKG Interpretation   Date/Time:  Saturday January 03 2015 12:39:49 EDT Ventricular Rate:  92 PR Interval:  125 QRS Duration: 74 QT Interval:  348 QTC Calculation: 430 R Axis:   79 Text Interpretation:  Sinus rhythm with short PR No significant change  since last tracing Confirmed by Kari Kerth MD, Reuel Boom ([id number]) on 01/03/2015  12:47:10 PM      MDM   Final diagnoses:  Syncope and collapse    24 yo F with a chief complaint of syncope. Is a recurrent issue for this patient. EKG unremarkable. We'll obtain a plan care pregnancy. The patient is able to tolerate by mouth and ambulate without difficulty she'll be discharged  home. Follow-up with her electrophysiologist.  3:15 PM:  I have discussed the diagnosis/risks/treatment options with the patient and believe the pt to be eligible for discharge home to follow-up with EP at [facility]. We also discussed returning to the ED immediately if new or worsening sx occur. We discussed the sx which are most concerning (e.g., recurrent events) that necessitate immediate return. Medications administered to the patient during their visit and any new prescriptions provided to the patient are listed below.  Medications given during this visit Medications - No data to display  Discharge Medication List as of 01/03/2015  2:48 PM       The patient appears reasonably screen and/or stabilized for discharge and I doubt any other medical condition or other [facility] requiring further screening, evaluation, or treatment in the ED at this time prior to discharge.    Melene Plan, DO 01/03/15 1515

## 2015-01-05 ENCOUNTER — Emergency Department (HOSPITAL_COMMUNITY)
Admission: EM | Admit: 2015-01-05 | Discharge: 2015-01-05 | Disposition: A | Discharge status: Home / Self Care | Payer: Medicaid Other | Attending: Emergency Medicine | Admitting: Emergency Medicine

## 2015-01-05 ENCOUNTER — Encounter (HOSPITAL_COMMUNITY): Payer: Self-pay | Admitting: *Deleted

## 2015-01-05 DIAGNOSIS — W228XXA Striking against or struck by other objects, initial encounter: Secondary | ICD-10-CM | POA: Diagnosis not present

## 2015-01-05 DIAGNOSIS — Z79899 Other long term (current) drug therapy: Secondary | ICD-10-CM | POA: Diagnosis not present

## 2015-01-05 DIAGNOSIS — S0990XA Unspecified injury of head, initial encounter: Secondary | ICD-10-CM | POA: Insufficient documentation

## 2015-01-05 DIAGNOSIS — Y9389 Activity, other specified: Secondary | ICD-10-CM | POA: Diagnosis not present

## 2015-01-05 DIAGNOSIS — Y9289 Other specified places as the place of occurrence of the external cause: Secondary | ICD-10-CM | POA: Insufficient documentation

## 2015-01-05 DIAGNOSIS — Z8669 Personal history of other diseases of the nervous system and sense organs: Secondary | ICD-10-CM | POA: Diagnosis not present

## 2015-01-05 DIAGNOSIS — Y998 Other external cause status: Secondary | ICD-10-CM | POA: Diagnosis not present

## 2015-01-05 DIAGNOSIS — S199XXA Unspecified injury of neck, initial encounter: Secondary | ICD-10-CM | POA: Insufficient documentation

## 2015-01-05 DIAGNOSIS — R55 Syncope and collapse: Secondary | ICD-10-CM | POA: Diagnosis present

## 2015-01-05 LAB — I-STAT CHEM 8, ED
BUN: 8 mg/dL (ref 6–20)
CALCIUM ION: 1.22 mmol/L (ref 1.12–1.23)
Chloride: 103 mmol/L (ref 101–111)
Creatinine, Ser: 0.8 mg/dL (ref 0.44–1.00)
GLUCOSE: 73 mg/dL (ref 65–99)
HCT: 39 % (ref 36.0–46.0)
HEMOGLOBIN: 13.3 g/dL (ref 12.0–15.0)
Potassium: 4 mmol/L (ref 3.5–5.1)
Sodium: 140 mmol/L (ref 135–145)
TCO2: 24 mmol/L (ref 0–100)

## 2015-01-05 LAB — I-STAT TROPONIN, ED: TROPONIN I, POC: 0.01 ng/mL (ref 0.00–0.08)

## 2015-01-05 MED ORDER — ACETAMINOPHEN 500 MG PO TABS
1000.0000 mg | ORAL_TABLET | Freq: Once | ORAL | Status: AC
  Administered 2015-01-05: 1000 mg via ORAL
  Filled 2015-01-05: qty 2

## 2015-01-05 NOTE — Discharge Instructions (Signed)
Syncope °Syncope is a medical term for fainting or passing out. This means you lose consciousness and drop to the ground. People are generally unconscious for less than 5 minutes. You may have some muscle twitches for up to 15 seconds before waking up and returning to normal. Syncope occurs more often in older adults, but it can happen to anyone. While most causes of syncope are not dangerous, syncope can be a sign of a serious medical problem. It is important to seek medical care.  °CAUSES  °Syncope is caused by a sudden drop in blood flow to the brain. The specific cause is often not determined. Factors that can bring on syncope include: °· Taking medicines that lower blood pressure. °· Sudden changes in posture, such as standing up quickly. °· Taking more medicine than prescribed. °· Standing in one place for too long. °· Seizure disorders. °· Dehydration and excessive exposure to heat. °· Low blood sugar (hypoglycemia). °· Straining to have a bowel movement. °· Heart disease, irregular heartbeat, or other circulatory problems. °· Fear, emotional distress, seeing blood, or severe pain. °SYMPTOMS  °Right before fainting, you may: °· Feel dizzy or light-headed. °· Feel nauseous. °· See all white or all black in your field of vision. °· Have cold, clammy skin. °DIAGNOSIS  °Your health care provider will ask about your symptoms, perform a physical exam, and perform an electrocardiogram (ECG) to record the electrical activity of your heart. Your health care provider may also perform other heart or blood tests to determine the cause of your syncope which may include: °· Transthoracic echocardiogram (TTE). During echocardiography, sound waves are used to evaluate how blood flows through your heart. °· Transesophageal echocardiogram (TEE). °· Cardiac monitoring. This allows your health care provider to monitor your heart rate and rhythm in real time. °· Holter monitor. This is a portable device that records your  heartbeat and can help diagnose heart arrhythmias. It allows your health care provider to track your heart activity for several days, if needed. °· Stress tests by exercise or by giving medicine that makes the heart beat faster. °TREATMENT  °In most cases, no treatment is needed. Depending on the cause of your syncope, your health care provider may recommend changing or stopping some of your medicines. °HOME CARE INSTRUCTIONS °· Have someone stay with you until you feel stable. °· Do not drive, use machinery, or play sports until your health care provider says it is okay. °· Keep all follow-up appointments as directed by your health care provider. °· Lie down right away if you start feeling like you might faint. Breathe deeply and steadily. Wait until all the symptoms have passed. °· Drink enough fluids to keep your urine clear or pale yellow. °· If you are taking blood pressure or heart medicine, get up slowly and take several minutes to sit and then stand. This can reduce dizziness. °SEEK IMMEDIATE MEDICAL CARE IF:  °· You have a severe headache. °· You have unusual pain in the chest, abdomen, or back. °· You are bleeding from your mouth or rectum, or you have black or tarry stool. °· You have an irregular or very fast heartbeat. °· You have pain with breathing. °· You have repeated fainting or seizure-like jerking during an episode. °· You faint when sitting or lying down. °· You have confusion. °· You have trouble walking. °· You have severe weakness. °· You have vision problems. °If you fainted, call your local emergency services (911 in [place] Do not drive   yourself to the hospital.  °MAKE SURE YOU: °· Understand these instructions. °· Will watch your condition. °· Will get help right away if you are not doing well or get worse. °Document Released: 03/21/2005 Document Revised: 03/26/2013 Document Reviewed: 05/20/2011 °ExitCare® Patient Information ©2015 [employer]. This information is not intended to replace  advice given to you by your health care provider. Make sure you discuss any questions you have with your health care provider. ° °

## 2015-01-05 NOTE — ED Provider Notes (Signed)
CSN: [id number]     Arrival date & time 01/05/15  1343 History   First MD Initiated Contact with Patient 01/05/15 1346     Chief Complaint  Patient presents with  . Loss of Consciousness     (Consider location/radiation/quality/duration/timing/severity/associated sxs/prior Treatment) HPI   The patient is a 24 year old female history of WPW s/p ablasion, vasovagal syncope, POTS, palpitations, who presents to the emergency room for recurrence of syncopal episodes.  The patient had been escorted all day by her pastor, however when he was briefly gone to retrieve his car she had a syncopal episode where she believes she hit the left side of her head on the concrete.  The syncopal episode was unwitnessed. She complains of tenderness to her left forehead and mild posterior neck pain.  There is no active bleeding, no swelling or redness to her forehead. She is currently not complaining of any shortness of breath, chest pain, palpitations, lightheadedness, dizziness, visual disturbances, numbness or tingling. She denies possibility of being dehydrated, states she drinks adequate fluids and has not had any recent nausea, vomiting, diarrhea.   Past Medical History  Diagnosis Date  . WPW (Wolff-Parkinson-White syndrome)     a. s/p ablation (2011) Dr Gevena Cotton at [facility] (posterior lateral pathway)  . Vasovagal syncope     a. positive tilt 2011 b. failed medical therapy with Florinef, Midodrine, Inderal, Celexa  . POTS (postural orthostatic tachycardia syndrome)   . Palpitations     a. s/p MDT ILR implanted by Dr Alfonso Ellis [facility])  . Headache   . Syncope     recurrent, daily   Past Surgical History  Procedure Laterality Date  . Cardiac electrophysiology study and ablation  2011    Dr. Gevena Cotton at [facility]- left posterior pathway ablation for WPW  . Tilt table study  2011    neurally mediated syncope  . Loop recorder implant  01/22/2014    MDT LINQ implanted by Dr Alfonso Ellis at [facility] for evaluation of  palpitations/syncope   Family History  Problem Relation Age of Onset  . Diabetes Mother   . Hypertension Mother   . Stroke Mother   . Hypertension Brother    Social History  Substance Use Topics  . Smoking status: Never Smoker   . Smokeless tobacco: None  . Alcohol Use: No   OB History    No data available     Review of Systems  Constitutional: Negative.   HENT: Negative.   Eyes: Negative for photophobia, pain, redness and visual disturbance.  Respiratory: Negative for cough, chest tightness and shortness of breath.   Cardiovascular: Negative for chest pain, palpitations and leg swelling.  Gastrointestinal: Negative.  Negative for nausea, vomiting, abdominal pain, diarrhea, constipation, blood in stool, abdominal distention, anal bleeding and rectal pain.  Endocrine: Negative.   Genitourinary: Negative.   Musculoskeletal: Positive for neck pain. Negative for myalgias, back pain, joint swelling, arthralgias, gait problem and neck stiffness.  Skin: Negative.  Negative for color change, pallor, rash and wound.  Neurological: Positive for syncope. Negative for dizziness, tremors, seizures, facial asymmetry, speech difficulty, weakness and numbness.  Psychiatric/Behavioral: Negative.     Allergies  Amitiza; Ketorolac; Metoclopramide; Midodrine; and Fludrocortisone  Home Medications   Prior to Admission medications   Medication Sig Start Date End Date Taking? Authorizing Provider  Calcium-Magnesium-Vitamin D (CALCIUM MAGNESIUM PO) Take 2 tablets by mouth daily.   Yes Historical Provider, MD  Cholecalciferol (D-5000) 5000 UNITS TABS Take 5,000 Units by mouth daily.   Yes Historical  Provider, MD  Cyanocobalamin (RA VITAMIN B-12 TR) 1000 MCG TBCR Take 1,000 mcg by mouth daily.   Yes Historical Provider, MD  ibuprofen (ADVIL,MOTRIN) 800 MG tablet Take 1 tablet (800 mg total) by mouth every 8 (eight) hours as needed. 12/11/14  Yes Bethann Berkshire, MD  IRON PO Take 325 mg by mouth daily.    Yes Historical Provider, MD  Probiotic Product (PROBIOTIC PO) Take 1 tablet by mouth daily.   Yes Historical Provider, MD  sodium chloride 1 G tablet Take 1 g by mouth 2 (two) times daily.   Yes Historical Provider, MD   BP 114/64 mmHg  Pulse 96  Temp(Src) 99.1 F (37.3 C) (Oral)  Resp 18  Ht  (1.6 m)  Wt 132 lb (59.875 kg)  BMI 23.39 kg/m2  SpO2 100%  LMP 12/25/2014 Physical Exam  Physical Exam  Constitutional: She is oriented to person, place, and time. She appears well-developed and well-nourished. No distress.  HENT:  Head: Normocephalic and atraumatic.  Eyes: EOM are normal. Pupils are equal, round, and reactive to light.  Neck: Normal range of motion. Neck supple. TTP to bilateral cervical paraspinal muscles. No ttp of cervical spinal processess.  No step-off Cardiovascular: Normal rate and regular rhythm. Exam reveals no gallop and no friction rub.  No murmur heard.   Pulmonary/Chest: Effort normal. She has no wheezes. She has no rales.  Abdominal: Soft. She exhibits no distension. There is no tenderness. There is no rebound.  Musculoskeletal: She exhibits no edema or tenderness.  Neurological: She is alert and oriented to person, place, and time.  Speech is clear and goal oriented, follows commands Major Cranial nerves without deficit, no facial droop Normal strength in upper and lower extremities bilaterally including dorsiflexion and plantar flexion, strong and equal grip strength Sensation normal to light and sharp touch Moves extremities without ataxia, coordination intact Normal finger to nose and rapid alternating movements Neg romberg, no pronator drift Normal gait and balance Skin: Skin is warm and dry. She is not diaphoretic.  Psychiatric: She has a normal mood and affect. Her behavior is normal.   ED Course  Procedures (including critical care time) Labs Review Labs Reviewed  I-STAT TROPOININ, ED  I-STAT CHEM 8, ED    Imaging Review No  results found. I have personally reviewed and evaluated these images and lab results as part of my medical decision-making.   EKG Interpretation   Date/Time:  Monday January 05 2015 13:48:50 EDT Ventricular Rate:  88 PR Interval:  119 QRS Duration: 83 QT Interval:  350 QTC Calculation: 423 R Axis:   72 Text Interpretation:  Sinus rhythm Borderline short PR interval No  significant change since last tracing Confirmed by KNAPP  MD-J, JON  ([id number]) on 01/05/2015 2:04:54 PM      MDM   Final diagnoses:  Syncope, unspecified syncope type   Patient with a syncopal episode, recurrent issue for the patient with history of WPW and POTS The patient was not orthostatic upon presentation, her EKG was unremarkable, she has been on cardiac monitoring for almost 3 hours without any arrhythmia. A chem 8 and troponin was unremarkable. She refused to have any imaging of her cervical spine or head for fears of recurrent exposure to radiation.  She did complain of some tenderness to her posterior neck and forehead, without any visible signs of trauma.  She ambulated while in C-collar without any difficulty she had no cervical spinal process tenderness to palpation, no step off.  Patient had reassuring neurological exam without deficit.  She is well established with her PCP and will seek follow-up from cardiology. She has been trying to move up her appointment for evaluation of more frequent syncopal episodes.    Return precautions were given verbally acknowledged by the patient.  She was discharged home in satisfactory condition.  Medications  acetaminophen (TYLENOL) tablet 1,000 mg (1,000 mg Oral Given 01/05/15 1639)   Filed Vitals:   01/05/15 1415 01/05/15 1445 01/05/15 1530 01/05/15 1630  BP: 104/64 104/71 107/71 114/64  Pulse: 98 94 102 96  Temp:      TempSrc:      Resp: Height:      Weight:      SpO2: 99% 100% 100% 100%     Danelle Berry, PA-C 01/05/15 1647  Linwood Dibbles,  MD 01/09/15 0020

## 2015-01-05 NOTE — ED Notes (Signed)
Pt presents via [facility] for a syncopal episode, +LOC unknown amt of time.  Pt hit left side of head on ground and also c/o neck pain.  Hx: WPW and POTS.  Mild redness noted to left temple, no deformities or swelling noted.  Pt a x 4, NAD, resp e/u.  BP-103/74 CBG-80.

## 2015-01-05 NOTE — ED Notes (Signed)
Pt stable, ambulatory, states understanding of discharge instructions 

## 2015-01-08 ENCOUNTER — Encounter (HOSPITAL_COMMUNITY): Payer: Self-pay | Admitting: Emergency Medicine

## 2015-01-08 ENCOUNTER — Emergency Department (HOSPITAL_COMMUNITY)
Admission: EM | Admit: 2015-01-08 | Discharge: 2015-01-08 | Disposition: A | Discharge status: Home / Self Care | Payer: Medicaid Other | Attending: Emergency Medicine | Admitting: Emergency Medicine

## 2015-01-08 ENCOUNTER — Other Ambulatory Visit (HOSPITAL_COMMUNITY): Payer: Medicaid Other

## 2015-01-08 ENCOUNTER — Emergency Department (HOSPITAL_COMMUNITY): Payer: Medicaid Other

## 2015-01-08 DIAGNOSIS — R55 Syncope and collapse: Secondary | ICD-10-CM | POA: Insufficient documentation

## 2015-01-08 DIAGNOSIS — Z79899 Other long term (current) drug therapy: Secondary | ICD-10-CM | POA: Insufficient documentation

## 2015-01-08 DIAGNOSIS — Z3202 Encounter for pregnancy test, result negative: Secondary | ICD-10-CM | POA: Diagnosis not present

## 2015-01-08 DIAGNOSIS — I498 Other specified cardiac arrhythmias: Secondary | ICD-10-CM | POA: Diagnosis not present

## 2015-01-08 DIAGNOSIS — R0602 Shortness of breath: Secondary | ICD-10-CM | POA: Insufficient documentation

## 2015-01-08 LAB — BASIC METABOLIC PANEL
Anion gap: 9 (ref 5–15)
BUN: 9 mg/dL (ref 6–20)
CHLORIDE: 105 mmol/L (ref 101–111)
CO2: 25 mmol/L (ref 22–32)
CREATININE: 0.93 mg/dL (ref 0.44–1.00)
Calcium: 9.5 mg/dL (ref 8.9–10.3)
GFR calc Af Amer: >60 mL/min (ref >60)
GFR calc non Af Amer: >60 mL/min (ref >60)
Glucose, Bld: 80 mg/dL (ref 65–99)
Potassium: 4.3 mmol/L (ref 3.5–5.1)
SODIUM: 139 mmol/L (ref 135–145)

## 2015-01-08 LAB — I-STAT TROPONIN, ED: Troponin i, poc: 0 ng/mL (ref 0.00–0.08)

## 2015-01-08 LAB — CBC
HCT: 36.4 % (ref 36.0–46.0)
Hemoglobin: 11.5 g/dL — ABNORMAL LOW (ref 12.0–15.0)
MCH: 28.1 pg (ref 26.0–34.0)
MCHC: 31.6 g/dL (ref 30.0–36.0)
MCV: 89 fL (ref 78.0–100.0)
PLATELETS: 186 10*3/uL (ref 150–400)
RBC: 4.09 MIL/uL (ref 3.87–5.11)
RDW: 11.9 % (ref 11.5–15.5)
WBC: 5.8 10*3/uL (ref 4.0–10.5)

## 2015-01-08 LAB — CBG MONITORING, ED: Glucose-Capillary: 77 mg/dL (ref 65–99)

## 2015-01-08 LAB — I-STAT BETA HCG BLOOD, ED (MC, WL, AP ONLY): I-stat hCG, quantitative: <5 m[IU]/mL (ref <5)

## 2015-01-08 NOTE — ED Notes (Signed)
CBG CHECKED 77

## 2015-01-08 NOTE — ED Notes (Signed)
Pt was found outside of ED near short stay on the ground. Unresponsive per bystanders. On arrival pt found lying on ground, eyes wide open, nods to answer questions. Hestitant to verbalize problem. Pt states she was coming to the hospital for testing. C/o headache. No obvious injuries noted. Pt awake, alert, VSS. Thinks todays date is Wednesday. Pt ambulatory with staff assistance to ED. EKG and CBG done in triage.

## 2015-01-08 NOTE — ED Notes (Signed)
Pt stated she does not have to use the restroom right now. Will check back later.

## 2015-01-08 NOTE — Discharge Instructions (Signed)

## 2015-01-08 NOTE — ED Provider Notes (Signed)
CSN: [id number]     Arrival date & time 01/08/15  1514 History   First MD Initiated Contact with Patient 01/08/15 1536     Chief Complaint  Patient presents with  . Loss of Consciousness     (Consider location/radiation/quality/duration/timing/severity/associated sxs/prior Treatment) Patient is a 24 y.o. female presenting with syncope. The history is provided by the patient.  Loss of Consciousness Episode history:  Single Most recent episode:  Today Timing:  Constant Progression:  Unchanged Chronicity:  Chronic Context: normal activity   Witnessed: yes   Relieved by:  Nothing Worsened by:  Nothing tried Associated symptoms: palpitations (had palpitatiosn before the event) and shortness of breath   Associated symptoms: no chest pain, no fever and no vomiting     Past Medical History  Diagnosis Date  . WPW (Wolff-Parkinson-White syndrome)     a. s/p ablation (2011) Dr Gevena Cotton at [facility] (posterior lateral pathway)  . Vasovagal syncope     a. positive tilt 2011 b. failed medical therapy with Florinef, Midodrine, Inderal, Celexa  . POTS (postural orthostatic tachycardia syndrome)   . Palpitations     a. s/p MDT ILR implanted by Dr Alfonso Ellis [facility])  . Headache   . Syncope     recurrent, daily   Past Surgical History  Procedure Laterality Date  . Cardiac electrophysiology study and ablation  2011    Dr. Gevena Cotton at [facility]- left posterior pathway ablation for WPW  . Tilt table study  2011    neurally mediated syncope  . Loop recorder implant  01/22/2014    MDT LINQ implanted by Dr Alfonso Ellis at [facility] for evaluation of palpitations/syncope   Family History  Problem Relation Age of Onset  . Diabetes Mother   . Hypertension Mother   . Stroke Mother   . Hypertension Brother    Social History  Substance Use Topics  . Smoking status: Never Smoker   . Smokeless tobacco: None  . Alcohol Use: No   OB History    No data available     Review of Systems  Constitutional: Negative  for fever.  Respiratory: Positive for shortness of breath. Negative for cough.   Cardiovascular: Positive for palpitations (had palpitatiosn before the event) and syncope. Negative for chest pain.  Gastrointestinal: Negative for vomiting and abdominal pain.  All other systems reviewed and are negative.     Allergies  Amitiza; Ketorolac; Metoclopramide; Midodrine; and Fludrocortisone  Home Medications   Prior to Admission medications   Medication Sig Start Date End Date Taking? Authorizing Provider  Calcium-Magnesium-Vitamin D (CALCIUM MAGNESIUM PO) Take 2 tablets by mouth daily.    Historical Provider, MD  Cholecalciferol (D-5000) 5000 UNITS TABS Take 5,000 Units by mouth daily.    Historical Provider, MD  Cyanocobalamin (RA VITAMIN B-12 TR) 1000 MCG TBCR Take 1,000 mcg by mouth daily.    Historical Provider, MD  ibuprofen (ADVIL,MOTRIN) 800 MG tablet Take 1 tablet (800 mg total) by mouth every 8 (eight) hours as needed. 12/11/14   Bethann Berkshire, MD  IRON PO Take 325 mg by mouth daily.    Historical Provider, MD  Probiotic Product (PROBIOTIC PO) Take 1 tablet by mouth daily.    Historical Provider, MD  sodium chloride 1 G tablet Take 1 g by mouth 2 (two) times daily.    Historical Provider, MD   BP 116/77 mmHg  Pulse 91  Temp(Src) 98.2 F (36.8 C) (Oral)  Resp 16  SpO2 100%  LMP 12/25/2014 Physical Exam  Constitutional: She is  oriented to person, place, and time. She appears well-developed and well-nourished. No distress.  HENT:  Head: Normocephalic and atraumatic.  Mouth/Throat: Oropharynx is clear and moist.  Eyes: EOM are normal. Pupils are equal, round, and reactive to light.  Neck: Normal range of motion. Neck supple.  Cardiovascular: Normal rate and regular rhythm.  Exam reveals no friction rub.   No murmur heard. Pulmonary/Chest: Effort normal and breath sounds normal. No respiratory distress. She has no wheezes. She has no rales.  Abdominal: Soft. She exhibits no  distension. There is no tenderness. There is no rebound.  Musculoskeletal: Normal range of motion. She exhibits no edema.  Neurological: She is alert and oriented to person, place, and time.  Skin: Skin is warm. No rash noted. She is not diaphoretic.  Nursing note and vitals reviewed.   ED Course  Procedures (including critical care time) Labs Review Labs Reviewed  CBC - Abnormal; Notable for the following:    Hemoglobin 11.5 (*)    All other components within normal limits  BASIC METABOLIC PANEL  URINALYSIS, ROUTINE W REFLEX MICROSCOPIC (NOT AT [facility])  CBG MONITORING, ED  I-STAT BETA HCG BLOOD, ED ([facility], [facility], [facility] ONLY)    Imaging Review Dg Chest 2 View  01/08/2015   CLINICAL DATA:  Tachycardia and syncope  EXAM: CHEST  2 VIEW  COMPARISON:  December 30, 2014  FINDINGS: Lungs are clear. Heart size and pulmonary vascularity are normal. No adenopathy. There is no demonstrable pneumothorax. No bone lesions.  IMPRESSION: No edema or consolidation.   Electronically Signed   By: Bretta Bang III M.D.   On: 01/08/2015 16:55   I have personally reviewed and evaluated these images and lab results as part of my medical decision-making.   EKG Interpretation   Date/Time:  Thursday January 08 2015 15:19:55 EDT Ventricular Rate:  92 PR Interval:  120 QRS Duration: 84 QT Interval:  352 QTC Calculation: 435 R Axis:   85 Text Interpretation:  Normal sinus rhythm Normal ECG No significant change  since last tracing Confirmed by Gwendolyn Grant  MD, Siennah Barrasso ([id number]) on 01/08/2015  3:39:16 PM      MDM   Final diagnoses:  Syncope, unspecified syncope type    86F here with syncope. Known syncopal disorder, hx of WPW and POTS. Seen frequently for syncope in the ED, last visit 3 days ago. Was coming here to the hospital for an appointment, however had syncopal event and was brought to the ED. She felt palpitations at that time, which is common for her. She had an ablation for her WPW in 2011. Here vitals  stable. EKG ok. Exam benign, will check labs.  Labs ok. Stable for discharge.  Elwin Mocha, MD 01/08/15 [phone]

## 2015-01-16 ENCOUNTER — Ambulatory Visit: Payer: Medicaid Other | Attending: Family Medicine | Admitting: Family Medicine

## 2015-01-16 ENCOUNTER — Encounter: Payer: Self-pay | Admitting: Family Medicine

## 2015-01-16 ENCOUNTER — Telehealth: Payer: Self-pay | Admitting: Family Medicine

## 2015-01-16 VITALS — BP 94/61 | HR 71 | Temp 99.0°F | Resp 16 | Ht 63.0 in | Wt 138.0 lb

## 2015-01-16 DIAGNOSIS — R000 Tachycardia, unspecified: Secondary | ICD-10-CM | POA: Diagnosis not present

## 2015-01-16 DIAGNOSIS — R079 Chest pain, unspecified: Secondary | ICD-10-CM | POA: Diagnosis present

## 2015-01-16 DIAGNOSIS — R0789 Other chest pain: Secondary | ICD-10-CM

## 2015-01-16 DIAGNOSIS — I456 Pre-excitation syndrome: Secondary | ICD-10-CM | POA: Diagnosis not present

## 2015-01-16 DIAGNOSIS — I951 Orthostatic hypotension: Secondary | ICD-10-CM

## 2015-01-16 DIAGNOSIS — R55 Syncope and collapse: Secondary | ICD-10-CM | POA: Diagnosis not present

## 2015-01-16 DIAGNOSIS — E162 Hypoglycemia, unspecified: Secondary | ICD-10-CM | POA: Insufficient documentation

## 2015-01-16 DIAGNOSIS — G90A Postural orthostatic tachycardia syndrome (POTS): Secondary | ICD-10-CM

## 2015-01-16 LAB — GLUCOSE, POCT (MANUAL RESULT ENTRY): POC Glucose: 111 mg/dl — AB (ref 70–99)

## 2015-01-16 NOTE — Patient Instructions (Addendum)
Owen,  Thank you for coming in today [facility]  I am on hold with Dr. Tillie FantasiaBeaty's office. I will look into moving up the appointment and discuss your new symptoms with Dr. Alfonso EllisBeaty.  Dr. Armen PickupFunches

## 2015-01-16 NOTE — Progress Notes (Signed)
Patient ID: Desiree Gutierrez, female   DOB: 1991-01-05, 24 y.o.   MRN: [id number]   Subjective:  Patient ID: Desiree Gutierrez, female    DOB: 01-06-1991  Age: 24 y.o. MRN: [id number]  CC: Follow-up  HPI Desiree Gutierrez presents for   ED f/u: patient seen in ED on 12/30/14, 01/03/15, 01/05/15 and 01/08/15. Her last two syncopal episodes were associated with head trauma. She reports headache and nausea. No emesis or vision changes. CXR normals. Trop negative. Hgb 11.5. EKGs range from NSR to sinus tach with HR up to 102.  She is experiencing palpitations with CP at 3 Am most mornings. She has experience an episode of chest heaviness with SOB and severe fatigue. This lasted for 3-4 hrs, followed by fatigue for most of the day.   She was exercising with a trainer but is no longer doing this due to a syncopal episode during exercise. Her trainer refuses to work her. She felt more energized when she was working out.   She has follow up with her cardiologist, Dr. Alfonso Ellis, at [facility] scheduled form 03/12/2015. She would like to pursue a sooner f/u appointment as well as a second opinion. She is interested in going to [facility]. She is currently on medical leave from school. She has been seen at [facility], [facility] and [facility].   Social History  Substance Use Topics  . Smoking status: Never Smoker   . Smokeless tobacco: Not on file  . Alcohol Use: No    Outpatient Prescriptions Prior to Visit  Medication Sig Dispense Refill  . Calcium-Magnesium-Vitamin D (CALCIUM MAGNESIUM PO) Take 2 tablets by mouth daily.    . Cholecalciferol (D-5000) 5000 UNITS TABS Take 5,000 Units by mouth daily.    . Cyanocobalamin (RA VITAMIN B-12 TR) 1000 MCG TBCR Take 1,000 mcg by mouth daily.    Marland Kitchen ibuprofen (ADVIL,MOTRIN) 800 MG tablet Take 1 tablet (800 mg total) by mouth every 8 (eight) hours as needed. 20 tablet 0  . IRON PO Take 325 mg by mouth daily.    . Probiotic Product (PROBIOTIC PO) Take 1 tablet by mouth  daily.    . sodium chloride 1 G tablet Take 1 g by mouth 2 (two) times daily.     No facility-administered medications prior to visit.    ROS Review of Systems  Constitutional: Positive for fatigue. Negative for fever and chills.  Eyes: Negative for visual disturbance.  Respiratory: Positive for chest tightness and shortness of breath.   Cardiovascular: Positive for chest pain.  Gastrointestinal: Positive for nausea. Negative for abdominal pain and blood in stool.  Musculoskeletal: Negative for back pain and arthralgias.  Skin: Negative for rash.  Allergic/Immunologic: Negative for immunocompromised state.  Neurological: Positive for syncope, light-headedness and headaches. Negative for dizziness, tremors, seizures, facial asymmetry, speech difficulty, weakness and numbness.  Hematological: Negative for adenopathy. Does not bruise/bleed easily.  Psychiatric/Behavioral: Negative for suicidal ideas and dysphoric mood.    Objective:  BP 94/61 mmHg  Pulse 71  Temp(Src) 99 F (37.2 C) (Oral)  Resp 16  Ht  (1.6 m)  Wt 138 lb (62.596 kg)  BMI 24.45 kg/m2  SpO2 98%  LMP 12/25/2014  BP/Weight 01/16/2015 01/08/2015 01/05/2015  Systolic BP 94 115 114  Diastolic BP 61 55 64  Wt. (Lbs) 138 - 132  BMI 24.45 - 23.39    Physical Exam  Constitutional: She is oriented to person, place, and time. She appears well-developed and well-nourished. No distress.  HENT:  Head: Normocephalic and atraumatic.  Cardiovascular: Normal rate, regular rhythm, normal heart sounds and intact distal pulses.   Pulmonary/Chest: Effort normal and breath sounds normal.  Musculoskeletal: She exhibits no edema.  Neurological: She is alert and oriented to person, place, and time.  Skin: Skin is warm and dry. No rash noted.  Psychiatric: She has a normal mood and affect.   CBG 111  Assessment & Plan:   Problem List Items Addressed This Visit    Chest pain   Relevant Orders   Ambulatory referral to  Cardiology   Hypoglycemia (Chronic)   Relevant Orders   POCT glucose (manual entry) (Completed)   POTS (postural orthostatic tachycardia syndrome) - Primary (Chronic)   Relevant Orders   Ambulatory referral to Cardiology   Syncope, vasovagal   Relevant Orders   Ambulatory referral to Cardiology   WOLFF-PARKINSON-WHITE (WPW) SYNDROME status post ablation (Chronic)   Relevant Orders   Ambulatory referral to Cardiology     Called patient's cardiology office to pursue a f/u appt and relay patient concerns to her cardiologist. Placed referral to cardiology at [facility] to start the process to determine if she could be seen there.   No orders of the defined types were placed in this encounter.    Follow-up: No Follow-up on file.   Dessa PhiJosalyn Christy Ehrsam MD

## 2015-01-16 NOTE — Progress Notes (Signed)
F/U Chest pain, nausea,  Pain scale #10 Stated elevated HR at night

## 2015-01-23 NOTE — Telephone Encounter (Signed)
Attempted to call patient's cardiologist to discuss and coordinate care Unable to get thru  Unable to leave message on physician access line.

## 2015-01-30 ENCOUNTER — Emergency Department (HOSPITAL_COMMUNITY)
Admission: EM | Admit: 2015-01-30 | Discharge: 2015-01-31 | Disposition: A | Discharge status: Home / Self Care | Payer: Medicaid Other | Attending: Emergency Medicine | Admitting: Emergency Medicine

## 2015-01-30 ENCOUNTER — Emergency Department (HOSPITAL_COMMUNITY): Payer: Medicaid Other

## 2015-01-30 ENCOUNTER — Encounter (HOSPITAL_COMMUNITY): Payer: Self-pay | Admitting: Emergency Medicine

## 2015-01-30 DIAGNOSIS — R079 Chest pain, unspecified: Secondary | ICD-10-CM | POA: Diagnosis not present

## 2015-01-30 DIAGNOSIS — R0602 Shortness of breath: Secondary | ICD-10-CM | POA: Diagnosis not present

## 2015-01-30 DIAGNOSIS — Z79899 Other long term (current) drug therapy: Secondary | ICD-10-CM | POA: Diagnosis not present

## 2015-01-30 DIAGNOSIS — R55 Syncope and collapse: Secondary | ICD-10-CM | POA: Insufficient documentation

## 2015-01-30 DIAGNOSIS — Z3202 Encounter for pregnancy test, result negative: Secondary | ICD-10-CM | POA: Diagnosis not present

## 2015-01-30 DIAGNOSIS — Z8679 Personal history of other diseases of the circulatory system: Secondary | ICD-10-CM | POA: Insufficient documentation

## 2015-01-30 LAB — I-STAT TROPONIN, ED: TROPONIN I, POC: 0 ng/mL (ref 0.00–0.08)

## 2015-01-30 LAB — BASIC METABOLIC PANEL
Anion gap: 6 (ref 5–15)
BUN: 14 mg/dL (ref 6–20)
CALCIUM: 9.5 mg/dL (ref 8.9–10.3)
CO2: 27 mmol/L (ref 22–32)
CREATININE: 0.86 mg/dL (ref 0.44–1.00)
Chloride: 107 mmol/L (ref 101–111)
GFR calc Af Amer: >60 mL/min (ref >60)
Glucose, Bld: 99 mg/dL (ref 65–99)
POTASSIUM: 3.8 mmol/L (ref 3.5–5.1)
SODIUM: 140 mmol/L (ref 135–145)

## 2015-01-30 LAB — CBC WITH DIFFERENTIAL/PLATELET
BASOS PCT: 0 %
Basophils Absolute: 0 10*3/uL (ref 0.0–0.1)
EOS ABS: 0.1 10*3/uL (ref 0.0–0.7)
EOS PCT: 2 %
HCT: 29.8 % — ABNORMAL LOW (ref 36.0–46.0)
Hemoglobin: 9.9 g/dL — ABNORMAL LOW (ref 12.0–15.0)
LYMPHS ABS: 2.8 10*3/uL (ref 0.7–4.0)
Lymphocytes Relative: 51 %
MCH: 29.2 pg (ref 26.0–34.0)
MCHC: 33.2 g/dL (ref 30.0–36.0)
MCV: 87.9 fL (ref 78.0–100.0)
Monocytes Absolute: 0.4 10*3/uL (ref 0.1–1.0)
Monocytes Relative: 7 %
Neutro Abs: 2.3 10*3/uL (ref 1.7–7.7)
Neutrophils Relative %: 40 %
PLATELETS: 183 10*3/uL (ref 150–400)
RBC: 3.39 MIL/uL — AB (ref 3.87–5.11)
RDW: 11.8 % (ref 11.5–15.5)
WBC: 5.6 10*3/uL (ref 4.0–10.5)

## 2015-01-30 LAB — D-DIMER, QUANTITATIVE: D-Dimer, Quant: <0.27 ug/mL-FEU (ref 0.00–0.48)

## 2015-01-30 NOTE — ED Provider Notes (Signed)
CSN: [id number]     Arrival date & time 01/30/15  2200 History   First MD Initiated Contact with Patient 01/30/15 2209     Chief Complaint  Patient presents with  . Seizures     (Consider location/radiation/quality/duration/timing/severity/associated sxs/prior Treatment) Patient is a 24 y.o. female presenting with syncope.  Loss of Consciousness Episode history:  Single Most recent episode:  Today Timing:  Constant Progression:  Resolved Chronicity:  Chronic Context comment:  While walking, well known history of same with former WPW, ongoing wu Relieved by:  Nothing Worsened by:  Nothing tried Associated symptoms: chest pain, seizures (? report of brother, no prior ho seizures) and shortness of breath     Past Medical History  Diagnosis Date  . WPW (Wolff-Parkinson-White syndrome)     a. s/p ablation (2011) Dr Gevena Cotton at [facility] (posterior lateral pathway)  . Vasovagal syncope     a. positive tilt 2011 b. failed medical therapy with Florinef, Midodrine, Inderal, Celexa  . POTS (postural orthostatic tachycardia syndrome)   . Palpitations     a. s/p MDT ILR implanted by Dr Alfonso Ellis [facility])  . Headache   . Syncope     recurrent, daily   Past Surgical History  Procedure Laterality Date  . Cardiac electrophysiology study and ablation  2011    Dr. Gevena Cotton at [facility]- left posterior pathway ablation for WPW  . Tilt table study  2011    neurally mediated syncope  . Loop recorder implant  01/22/2014    MDT LINQ implanted by Dr Alfonso Ellis at [facility] for evaluation of palpitations/syncope  . Cardiac surgery     Family History  Problem Relation Age of Onset  . Diabetes Mother   . Hypertension Mother   . Stroke Mother   . Hypertension Brother    Social History  Substance Use Topics  . Smoking status: Never Smoker   . Smokeless tobacco: None  . Alcohol Use: No   OB History    No data available     Review of Systems  Respiratory: Positive for shortness of breath.    Cardiovascular: Positive for chest pain and syncope.  Neurological: Positive for seizures (? report of brother, no prior ho seizures).  All other systems reviewed and are negative.     Allergies  Amitiza; Ketorolac; Metoclopramide; Midodrine; and Fludrocortisone  Home Medications   Prior to Admission medications   Medication Sig Start Date End Date Taking? Authorizing Provider  Calcium-Magnesium-Vitamin D (CALCIUM MAGNESIUM PO) Take 2 tablets by mouth daily.   Yes Historical Provider, MD  Cholecalciferol (D-5000) 5000 UNITS TABS Take 5,000 Units by mouth daily.   Yes Historical Provider, MD  Coenzyme Q10 (COQ10 PO) Take 1 tablet by mouth daily.   Yes Historical Provider, MD  Cyanocobalamin (RA VITAMIN B-12 TR) 1000 MCG TBCR Take 1,000 mcg by mouth daily.   Yes Historical Provider, MD  IRON PO Take 325 mg by mouth daily.   Yes Historical Provider, MD  omega-3 acid ethyl esters (LOVAZA) 1 G capsule Take 1 g by mouth daily.   Yes Historical Provider, MD  Probiotic Product (PROBIOTIC PO) Take 1 tablet by mouth daily.   Yes Historical Provider, MD  sodium chloride 1 G tablet Take 1 g by mouth 2 (two) times daily.   Yes Historical Provider, MD   BP 111/66 mmHg  Pulse 85  Temp(Src) 98.7 F (37.1 C) (Oral)  Resp 20  SpO2 100%  LMP 01/23/2015 Physical Exam  Constitutional: She is oriented to person,  place, and time. She appears well-developed and well-nourished.  HENT:  Head: Normocephalic and atraumatic.  Right Ear: External ear normal.  Left Ear: External ear normal.  Eyes: Conjunctivae and EOM are normal. Pupils are equal, round, and reactive to light.  Neck: Normal range of motion. Neck supple.  Cardiovascular: Normal rate, regular rhythm, normal heart sounds and intact distal pulses.   Pulmonary/Chest: Effort normal and breath sounds normal.  Abdominal: Soft. Bowel sounds are normal. There is no tenderness.  Musculoskeletal: Normal range of motion.  Neurological: She is alert  and oriented to person, place, and time.  Skin: Skin is warm and dry.  Vitals reviewed.   ED Course  Procedures (including critical care time) Labs Review Labs Reviewed  CBC WITH DIFFERENTIAL/PLATELET - Abnormal; Notable for the following:    RBC 3.39 (*)    Hemoglobin 9.9 (*)    HCT 29.8 (*)    All other components within normal limits  URINALYSIS, ROUTINE W REFLEX MICROSCOPIC (NOT AT [facility]) - Abnormal; Notable for the following:    APPearance CLOUDY (*)    All other components within normal limits  BASIC METABOLIC PANEL  URINE RAPID DRUG SCREEN, HOSP PERFORMED  D-DIMER, QUANTITATIVE (NOT AT [facility])  I-STAT TROPOININ, ED  POC URINE PREG, ED  I-STAT TROPOININ, ED    Imaging Review No results found. I have personally reviewed and evaluated these images and lab results as part of my medical decision-making.   EKG Interpretation   Date/Time:  Monday February 02 2015 11:38:46 EDT Ventricular Rate:  94 PR Interval:  124 QRS Duration: 68 QT Interval:  348 QTC Calculation: 435 R Axis:   77 Text Interpretation:  Sinus rhythm Borderline T wave abnormalities ED  PHYSICIAN INTERPRETATION AVAILABLE IN [facility] Confirmed by TEST,  Record ([id number]) on 02/03/2015 7:12:52 AM      MDM   Final diagnoses:  Syncope, unspecified syncope type    24 y.o. female with pertinent PMH of WPW sp ablation, multiple recurrent syncope (22 visits in 6 months) presents with recurrent syncopal episode with ? Seizure like activity afterwards.  On arrival pt asymptomatic.  Physical exam benign.  Pt states she fell and hit concrete, but has no signs of trauma.  Wu unremarkable.  DC home in stable condition.  I have reviewed all laboratory and imaging studies if ordered as above  1. Syncope, unspecified syncope type         Mirian MoMatthew Gentry, MD 02/04/15 0002

## 2015-01-30 NOTE — ED Notes (Addendum)
Pt was with brother when she had a syncopal episode along with seizure activity. Brother called EMS. Upon EMS arrival, patient found to be in post-ictal state. Pt with no seizure history, but history of WPW and syncopal episodes. Pt with heart monitor attached to chest. Pt arrived to ER CAO, stable vital signs. Pt states she has been having chest pain and shortness of breath all day today.

## 2015-01-31 LAB — RAPID URINE DRUG SCREEN, HOSP PERFORMED
AMPHETAMINES: NOT DETECTED
BARBITURATES: NOT DETECTED
BENZODIAZEPINES: NOT DETECTED
COCAINE: NOT DETECTED
Opiates: NOT DETECTED
Tetrahydrocannabinol: NOT DETECTED

## 2015-01-31 LAB — URINALYSIS, ROUTINE W REFLEX MICROSCOPIC
Bilirubin Urine: NEGATIVE
Glucose, UA: NEGATIVE mg/dL
Hgb urine dipstick: NEGATIVE
KETONES UR: NEGATIVE mg/dL
LEUKOCYTES UA: NEGATIVE
NITRITE: NEGATIVE
PH: 7 (ref 5.0–8.0)
Protein, ur: NEGATIVE mg/dL
SPECIFIC GRAVITY, URINE: 1.02 (ref 1.005–1.030)
Urobilinogen, UA: 0.2 mg/dL (ref 0.0–1.0)

## 2015-01-31 LAB — POC URINE PREG, ED: PREG TEST UR: NEGATIVE

## 2015-01-31 LAB — I-STAT TROPONIN, ED: Troponin i, poc: 0 ng/mL (ref 0.00–0.08)

## 2015-01-31 NOTE — ED Notes (Signed)
Dr. Oni at bedside at this time.  

## 2015-01-31 NOTE — Discharge Instructions (Signed)
Syncope Ms. Buzzell, your blood work today was normal.  See a primary care physician within 3 days for close follow up.  If any symptoms worsen, come back to the ED immediately. Thank you. Syncope means a person passes out (faints). The person usually wakes up in less than 5 minutes. It is important to seek medical care for syncope. HOME CARE  Have someone stay with you until you feel normal.  Do not drive, use machines, or play sports until your doctor says it is okay.  Keep all doctor visits as told.  Lie down when you feel like you might pass out. Take deep breaths. Wait until you feel normal before standing up.  Drink enough fluids to keep your pee (urine) clear or pale yellow.  If you take blood pressure or heart medicine, get up slowly. Take several minutes to sit and then stand. GET HELP RIGHT AWAY IF:   You have a severe headache.  You have pain in the chest, belly (abdomen), or back.  You are bleeding from the mouth or butt (rectum).  You have black or tarry poop (stool).  You have an irregular or very fast heartbeat.  You have pain with breathing.  You keep passing out, or you have shaking (seizures) when you pass out.  You pass out when sitting or lying down.  You feel confused.  You have trouble walking.  You have severe weakness.  You have vision problems. If you fainted, call for help (911 in [place] Do not drive yourself to the hospital.   This information is not intended to replace advice given to you by your health care provider. Make sure you discuss any questions you have with your health care provider.   Document Released: 09/07/2007 Document Revised: 08/05/2014 Document Reviewed: 05/20/2011 Elsevier Interactive Patient Education [employer]2016 Elsevier Inc.

## 2015-01-31 NOTE — ED Notes (Signed)
Discharge instructions reviewed with patient. Understanding verbalized. No distress noted. Patient declined wheelchair at time of discharge.  

## 2015-02-02 ENCOUNTER — Telehealth: Payer: Self-pay | Admitting: Family Medicine

## 2015-02-02 ENCOUNTER — Encounter: Payer: Self-pay | Admitting: Family Medicine

## 2015-02-02 ENCOUNTER — Ambulatory Visit: Payer: Medicaid Other | Attending: Family Medicine | Admitting: Family Medicine

## 2015-02-02 ENCOUNTER — Emergency Department (HOSPITAL_COMMUNITY)
Admission: EM | Admit: 2015-02-02 | Discharge: 2015-02-02 | Disposition: A | Discharge status: Home / Self Care | Payer: Medicaid Other | Attending: Emergency Medicine | Admitting: Emergency Medicine

## 2015-02-02 ENCOUNTER — Encounter (HOSPITAL_COMMUNITY): Payer: Self-pay | Admitting: *Deleted

## 2015-02-02 ENCOUNTER — Other Ambulatory Visit: Payer: Self-pay

## 2015-02-02 VITALS — BP 133/72 | HR 115 | Temp 97.9°F | Resp 16 | Ht 63.0 in | Wt 135.0 lb

## 2015-02-02 DIAGNOSIS — E162 Hypoglycemia, unspecified: Secondary | ICD-10-CM | POA: Diagnosis not present

## 2015-02-02 DIAGNOSIS — R569 Unspecified convulsions: Secondary | ICD-10-CM

## 2015-02-02 DIAGNOSIS — Z79899 Other long term (current) drug therapy: Secondary | ICD-10-CM | POA: Diagnosis not present

## 2015-02-02 DIAGNOSIS — Z8679 Personal history of other diseases of the circulatory system: Secondary | ICD-10-CM | POA: Diagnosis not present

## 2015-02-02 DIAGNOSIS — R55 Syncope and collapse: Secondary | ICD-10-CM | POA: Diagnosis not present

## 2015-02-02 LAB — I-STAT CHEM 8, ED
BUN: 15 mg/dL (ref 6–20)
CALCIUM ION: 1.21 mmol/L (ref 1.12–1.23)
CHLORIDE: 104 mmol/L (ref 101–111)
Creatinine, Ser: 0.8 mg/dL (ref 0.44–1.00)
Glucose, Bld: 74 mg/dL (ref 65–99)
HEMATOCRIT: 40 % (ref 36.0–46.0)
Hemoglobin: 13.6 g/dL (ref 12.0–15.0)
Potassium: 4.3 mmol/L (ref 3.5–5.1)
SODIUM: 141 mmol/L (ref 135–145)
TCO2: 26 mmol/L (ref 0–100)

## 2015-02-02 LAB — I-STAT TROPONIN, ED: Troponin i, poc: 0 ng/mL (ref 0.00–0.08)

## 2015-02-02 LAB — GLUCOSE, POCT (MANUAL RESULT ENTRY): POC Glucose: 81 mg/dl (ref 70–99)

## 2015-02-02 NOTE — ED Provider Notes (Signed)
Patient presented to the ER with syncopal episode. Patient with a history of WPW as well as POTS and recurrent syncope. There was concern for possible seizure activity prior to arrival.  Face to face Exam: HEENT - PERRLA Lungs - CTAB Heart - RRR, no M/R/G Abd - S/NT/ND Neuro - alert, oriented x3  Plan: No evidence of arrhythmia on monitor or from her external monitor that she came in with. Workup unremarkable. Follow-up with neurology to rule out seizure, but doubt seizure. EMS described atypical activity prior to arrival that did not seem consistent with seizure or postictal state.  Gilda Creasehristopher J Normajean Nash, MD 02/02/15 1538

## 2015-02-02 NOTE — ED Provider Notes (Addendum)
Patient presented to the ER with syncope. Patient had a syncopal episode and then was noted to have a rapid heart rate and then had shaking motions that was possibly seizure-like.  Face to face Exam: HEENT - PERRLA Lungs - CTAB Heart - RRR, no M/R/G Abd - S/NT/ND Neuro - alert, oriented x3  Plan: Patient with well-known frequent syncope secondary to POTS. She also has history of WPW. Will monitor, check electrolytes. Patient has external monitor in place, will check for arrhythmia.   ED ECG REPORT   Date: 02/02/2015  Rate: 94  Rhythm: normal sinus rhythm  QRS Axis: normal  Intervals: normal  ST/T Wave abnormalities: nonspecific T wave changes  Conduction Disutrbances:none  Narrative Interpretation:   Old EKG Reviewed: none available  I have personally reviewed the EKG tracing and agree with the computerized printout as noted.   Gilda Creasehristopher J Kyrollos Cordell, MD 02/02/15 1145  Gilda Creasehristopher J Naszir Cott, MD 02/02/15 [phone]

## 2015-02-02 NOTE — Telephone Encounter (Signed)
Opened in error

## 2015-02-02 NOTE — Telephone Encounter (Signed)
Patient was seen today but had to be rushed to hospital. Patient returned. Patient needs referral to Neurologist. Patient needs further instruction on how to follow up from ED Visit. Please follow up with patient.

## 2015-02-02 NOTE — Progress Notes (Signed)
HFU low O2, elevated HR chest pain Pain scale &

## 2015-02-02 NOTE — Progress Notes (Signed)
Patient ID: Desiree Gutierrez, female   DOB: [birth year], 24 y.o.   MRN: [id number]   Subjective:  Patient ID: Desiree Gutierrez, female    DOB: [birth year]  Age: 24 y.o. MRN: [id number]  CC: Hospitalization Follow-up   HPI Desiree Gutierrez presents for   1. ED f/u: syncope. Patient in ED on 01/30/15. Normal CXR, EKG and negative trop. Syncope in setting of CP, SOB and ? Seizure. Patient has established with a new cardiologist at [facility], Dr. Rudolpho Sevin who plans to repeat ECHO, event monitor, and tilt table test or EEG.   I walked into the patient's room at 10:42 AM, she was sitting on the table. Awake, eyes open. She would not make eye contact or answer questions. She closed her eyes and feel backwards. She was unresponsive for about 1 minutes. She then opened her eyes. She nodded yes when asked about chest pain.   Vitals: HR 180, BP 110/80, O2 88 %. CBG at start of visit 81.   EKG. NSR with HR of 98  At 10:44 patient has 30-45 seconds of generalized tonic clonic movements. At this times eyes are closed and she is not responding to commands. O2 83%  6 L [place] placed  EMS called  Repeat O2 100 %   Social History  Substance Use Topics  . Smoking status: Never Smoker   . Smokeless tobacco: Not on file  . Alcohol Use: No    Outpatient Prescriptions Prior to Visit  Medication Sig Dispense Refill  . Calcium-Magnesium-Vitamin D (CALCIUM MAGNESIUM PO) Take 2 tablets by mouth daily.    . Cholecalciferol (D-5000) 5000 UNITS TABS Take 5,000 Units by mouth daily.    . Coenzyme Q10 (COQ10 PO) Take 1 tablet by mouth daily.    . Cyanocobalamin (RA VITAMIN B-12 TR) 1000 MCG TBCR Take 1,000 mcg by mouth daily.    . IRON PO Take 325 mg by mouth daily.    Marland Kitchen omega-3 acid ethyl esters (LOVAZA) 1 G capsule Take 1 g by mouth daily.    . Probiotic Product (PROBIOTIC PO) Take 1 tablet by mouth daily.    . sodium chloride 1 G tablet Take 1 g by mouth 2 (two) times daily.     No facility-administered medications prior to  visit.    ROS Review of Systems  Constitutional: Negative for fever and chills.  Eyes: Negative for visual disturbance.  Respiratory: Negative for shortness of breath.   Cardiovascular: Positive for chest pain.  Gastrointestinal: Negative for abdominal pain and blood in stool.  Musculoskeletal: Negative for back pain and arthralgias.  Skin: Negative for rash.  Allergic/Immunologic: Negative for immunocompromised state.  Hematological: Negative for adenopathy. Does not bruise/bleed easily.  Psychiatric/Behavioral: Negative for suicidal ideas and dysphoric mood.    Objective:  BP 133/72 mmHg  Pulse 115  Temp(Src) 97.9 F (36.6 C) (Oral)  Resp 16  Ht  (1.6 m)  Wt 135 lb (61.236 kg)  BMI 23.92 kg/m2  SpO2 95%  LMP 01/23/2015  BP/Weight 02/02/2015 01/31/2015 01/16/2015  Systolic BP 133 111 94  Diastolic BP 72 66 61  Wt. (Lbs) 135 - 138  BMI 23.92 - 24.45    Physical Exam  Constitutional: She is oriented to person, place, and time. She appears well-developed and well-nourished. No distress.  HENT:  Head: Normocephalic and atraumatic.  Cardiovascular: Normal rate, regular rhythm, normal heart sounds and intact distal pulses.   Pulmonary/Chest: Effort normal and breath sounds normal. No respiratory distress. She has no wheezes. She  has no rales. She exhibits no tenderness.    Musculoskeletal: She exhibits no edema.  Neurological: She is alert and oriented to person, place, and time.  Skin: Skin is warm and dry. No rash noted.  Psychiatric: She has a normal mood and affect.   CBG 81 EKG: normal EKG, normal sinus rhythm, unchanged from previous tracings.   Assessment & Plan:   Problem List Items Addressed This Visit    Hypoglycemia - Primary (Chronic)   Relevant Orders   POCT glucose (manual entry) (Completed)     Syncope and seizure like activity EMS called, patient transferred to ED   No orders of the defined types were placed in this encounter.     Follow-up: No Follow-up on file.   Dessa PhiJosalyn Maxwel Meadowcroft MD

## 2015-02-02 NOTE — ED Provider Notes (Signed)
CSN: [id number]     Arrival date & time 02/02/15  1134 History   First MD Initiated Contact with Patient 02/02/15 1137     Chief Complaint  Patient presents with  . Seizures     (Consider location/radiation/quality/duration/timing/severity/associated sxs/prior Treatment) Patient is a 24 y.o. female presenting with seizures. The history is provided by the EMS personnel, the patient and a parent. The history is limited by the condition of the patient.  Seizures Seizure activity on arrival: no   Seizure type:  Unable to specify Preceding symptoms comment:  Palpitations, shortness of breath Initial focality:  Unable to specify Episode characteristics: disorientation, generalized shaking and partial responsiveness   Episode characteristics: no incontinence and no tongue biting   Postictal symptoms: no confusion, no memory loss and no somnolence   Return to baseline: yes   Severity:  Mild Duration:  45 seconds Timing:  Clustered Progression:  Unchanged Context comment:  Reduced PO intake Recent head injury:  No recent head injuries PTA treatment:  None History of seizures: yes     Past Medical History  Diagnosis Date  . WPW (Wolff-Parkinson-White syndrome)     a. s/p ablation (2011) Dr Gevena Cotton at [facility] (posterior lateral pathway)  . Vasovagal syncope     a. positive tilt 2011 b. failed medical therapy with Florinef, Midodrine, Inderal, Celexa  . POTS (postural orthostatic tachycardia syndrome)   . Palpitations     a. s/p MDT ILR implanted by Dr Alfonso Ellis [facility])  . Headache   . Syncope     recurrent, daily   Past Surgical History  Procedure Laterality Date  . Cardiac electrophysiology study and ablation  2011    Dr. Gevena Cotton at [facility]- left posterior pathway ablation for WPW  . Tilt table study  2011    neurally mediated syncope  . Loop recorder implant  01/22/2014    MDT LINQ implanted by Dr Alfonso Ellis at [facility] for evaluation of palpitations/syncope  . Cardiac surgery      Family History  Problem Relation Age of Onset  . Diabetes Mother   . Hypertension Mother   . Stroke Mother   . Hypertension Brother    Social History  Substance Use Topics  . Smoking status: Never Smoker   . Smokeless tobacco: None  . Alcohol Use: No   OB History    No data available     Review of Systems  Constitutional: Negative for fever.  HENT: Negative for facial swelling.   Respiratory: Negative for cough, chest tightness, shortness of breath and wheezing.   Cardiovascular: Negative for chest pain and leg swelling.  Gastrointestinal: Negative for abdominal pain.  Genitourinary: Negative for dysuria.  Musculoskeletal: Negative for back pain.  Skin: Negative for rash.  Neurological: Positive for seizures. Negative for headaches.  Psychiatric/Behavioral: Negative for confusion.      Allergies  Amitiza; Ketorolac; Metoclopramide; Midodrine; and Fludrocortisone  Home Medications   Prior to Admission medications   Medication Sig Start Date End Date Taking? Authorizing Provider  Calcium-Magnesium-Vitamin D (CALCIUM MAGNESIUM PO) Take 2 tablets by mouth daily.   Yes Historical Provider, MD  Cholecalciferol (D-5000) 5000 UNITS TABS Take 5,000 Units by mouth daily.   Yes Historical Provider, MD  Coenzyme Q10 (COQ10 PO) Take 1 tablet by mouth daily.   Yes Historical Provider, MD  Cyanocobalamin (RA VITAMIN B-12 TR) 1000 MCG TBCR Take 1,000 mcg by mouth daily.   Yes Historical Provider, MD  IRON PO Take 325 mg by mouth daily.   Yes  Historical Provider, MD  omega-3 acid ethyl esters (LOVAZA) 1 G capsule Take 1 g by mouth daily.   Yes Historical Provider, MD  Probiotic Product (PROBIOTIC PO) Take 1 tablet by mouth daily.   Yes Historical Provider, MD  sodium chloride 1 G tablet Take 1 g by mouth 2 (two) times daily.   Yes Historical Provider, MD   BP 116/77 mmHg  Pulse 76  Temp(Src) 99 F (37.2 C) (Oral)  Resp 16  SpO2 100%  LMP 01/23/2015 Physical Exam   Constitutional: She is oriented to person, place, and time. She appears well-developed and well-nourished. No distress.  HENT:  Head: Normocephalic and atraumatic.  Right Ear: External ear normal.  Left Ear: External ear normal.  Nose: Nose normal.  Mouth/Throat: Oropharynx is clear and moist. No oropharyngeal exudate.  Eyes: Conjunctivae and EOM are normal. Pupils are equal, round, and reactive to light. Right eye exhibits no discharge. Left eye exhibits no discharge. No scleral icterus.  Neck: Normal range of motion. Neck supple. No JVD present. No tracheal deviation present. No thyromegaly present.  Cardiovascular: Normal rate, regular rhythm and intact distal pulses.  Exam reveals no gallop and no friction rub.   No murmur heard. Pulmonary/Chest: Effort normal and breath sounds normal. No stridor. No respiratory distress. She has no wheezes. She has no rales. She exhibits no tenderness.  Abdominal: Soft. She exhibits no distension. There is no tenderness.  Musculoskeletal: Normal range of motion. She exhibits no edema or tenderness.  Lymphadenopathy:    She has no cervical adenopathy.  Neurological: She is alert and oriented to person, place, and time. She has normal strength. She is not disoriented. She displays no atrophy and no tremor. No cranial nerve deficit or sensory deficit. She exhibits normal muscle tone. She displays no seizure activity. Coordination and gait normal. GCS eye subscore is 4. GCS verbal subscore is 5. GCS motor subscore is 6.  Skin: Skin is warm and dry. No rash noted. She is not diaphoretic. No erythema. No pallor.  Psychiatric: Her affect is blunt. She is withdrawn. She is noncommunicative.  Patient initially appeared reluctant to respond to examination questions. But was not unresponsive or lethargic.  Nursing note and vitals reviewed.   ED Course  Procedures (including critical care time) Labs Review Labs Reviewed  I-STAT CHEM 8, ED  I-STAT TROPOININ, ED      EKG:  NSR, normal ECG, PR decreased compared to prior tracings.  MDM   Final diagnoses:  Syncope, unspecified syncope type  Convulsions, unspecified convulsion type [facility](HCC)   Patient resenting for single episode and convulsions. Patient was at her primary care physician's office. She was about to be examined. Reportedly had an episode where she became tachycardic to 180s, and lost consciousness. This was associated with generalized shaking and decreased LOC for 35-45 seconds. Patient was transported by EMS. Per their report patient had atypical convulsive activity in the ambulance, where she was responsive, and yet had ongoing shaking. Upon arrival to the emergency department patient initially was nonverbal but was aware and was responding to questions with head nods and shaking of her head only when pressed further and asked to respond verbally, she appeared hesitant, was about to attend painful stimulus to assess patient's level of consciousness and she promptly began talking.  Patient has a history of POTS and prior WPW treated with ablation. Patient currently has an event monitor which was placed by cardiology 4 days ago due to episodes of syncope and palpitations. It  does not appear that this was pushed during the event today. I contacted the company which monitors her device, and they did not have any record from today of any recorded arrhythmias or tachycardia. Unclear what the thresholds are for her device before records sinus tachycardia as abnormally high. Device did appear to be working correctly. Patient had laboratory workup ECG which did not show any abnormalities. Notably there no QTC prolongation, and normalized PR interval following ablation.  I discussed laboratory workup and ECG findings with patient's mother and the patient. Apparently the patient did not eat breakfast today. Orthostatic vital signs in the emergency room also normal. I refered the patient to neurology as she had  a similar episode recently which requires further workup for unspecified seizure disorder. Would also consider hypotensive episode with resultant myoclonic jerking given patient's prior history of POTS. Also patient notes she had some shortness of breath and chest pain prior to the event, possibly result of palpitations and tachycardia as she was reported to have heart rate of 180. Do not suspect a PE at this time as the patient has not had low oxygen saturations or tachycardia in the emergency department. She does not have any leg swelling or other sign of DVT.  No abnormalities noted on telemetry while in the emergency department. In addition to neurology follow-up I recommended regular meals, snacks, increase fluid intake, consideration for increased salt intake, continued event monitoring with care to push event monitor in the event of any episodes, and notation of any other associated findings which seem to correlate with patient's episodes.  Discussed workup and recommendations at length with patient and her mother. Explained that additional testing is required and that while a definitive diagnosis is not available this time I do recommend ongoing involvement of her specialist and primary physician. Mother denies any associated anxiety or other psychiatric symptoms associated with patient's episodes. I gave strict return precautions should patient have worsening or return of symptoms without resolution.   Patient and family given return precautions for LOC, and seizure like activity.  Pt advised on use of medications as applicable.  Advised to return for actely worsening symptoms, inability to take medications, or other acute concerns.  Advised to follow up with PCP in 1 week and neurology in 1 week.  Patient and family in agreement with and expressed understanding of follow plan, plan of care, and return precautions.  All questions answered prior to discharge.  Patient was discharged in stable condition  with family, ambulating without difficulty.  Patient care was discussed with my attending, Dr. Blinda Leatherwood.   Gavin Pound, MD 02/02/15 1644  Gilda Crease, MD 02/05/15 1610

## 2015-02-02 NOTE — Telephone Encounter (Signed)
Please inform patient Neurology referral placed, urgent referral   F/u in 4 weeks after neurology appt

## 2015-02-02 NOTE — Discharge Instructions (Signed)
Cardiac Event Monitoring °A cardiac event monitor is a small recording device used to help detect abnormal heart rhythms (arrhythmias). The monitor is used to record heart rhythm when noticeable symptoms such as the following occur: °· Fast heartbeats (palpitations), such as heart racing or fluttering. °· Dizziness. °· Fainting or light-headedness. °· Unexplained weakness. °The monitor is wired to two electrodes placed on your chest. Electrodes are flat, sticky disks that attach to your skin. The monitor can be worn for up to 30 days. You will wear the monitor at all times, except when bathing.  °HOW TO USE YOUR CARDIAC EVENT MONITOR °A technician will prepare your chest for the electrode placement. The technician will show you how to place the electrodes, how to work the monitor, and how to replace the batteries. Take time to practice using the monitor before you leave the office. Make sure you understand how to send the information from the monitor to your health care provider. This requires a telephone with a landline, not a cell phone. You need to: °· Wear your monitor at all times, except when you are in water: °¨ Do not get the monitor wet. °¨ Take the monitor off when bathing. Do not swim or use a hot tub with it on. °· Keep your skin clean. Do not put body lotion or moisturizer on your chest. °· Change the electrodes daily or any time they stop sticking to your skin. You might need to use tape to keep them on. °· It is possible that your skin under the electrodes could become irritated. To keep this from happening, try to put the electrodes in slightly different places on your chest. However, they must remain in the area under your left breast and in the upper right section of your chest. °· Make sure the monitor is safely clipped to your clothing or in a location close to your body that your health care provider recommends. °· Press the button to record when you feel symptoms of heart trouble, such as  dizziness, weakness, light-headedness, palpitations, thumping, shortness of breath, unexplained weakness, or a fluttering or racing heart. The monitor is always on and records what happened slightly before you pressed the button, so do not worry about being too late to get good information. °· Keep a diary of your activities, such as walking, doing chores, and taking medicine. It is especially important to note what you were doing when you pushed the button to record your symptoms. This will help your health care provider determine what might be contributing to your symptoms. The information stored in your monitor will be reviewed by your health care provider alongside your diary entries. °· Send the recorded information as recommended by your health care provider. It is important to understand that it will take some time for your health care provider to process the results. °· Change the batteries as recommended by your health care provider. °SEEK IMMEDIATE MEDICAL CARE IF:  °· You have chest pain. °· You have extreme difficulty breathing or shortness of breath. °· You develop a very fast heartbeat that persists. °· You develop dizziness that does not go away. °· You faint or constantly feel you are about to faint. °  °This information is not intended to replace advice given to you by your health care provider. Make sure you discuss any questions you have with your health care provider. °  °Document Released: 12/29/2007 Document Revised: 04/11/2014 Document Reviewed: 09/17/2012 °Elsevier Interactive Patient Education ©2016 [employer] ° °

## 2015-02-02 NOTE — Addendum Note (Signed)
Addended by: Dessa PhiFUNCHES, Kyleigha Markert on: 02/02/2015 11:10 AM   Modules accepted: Orders

## 2015-02-02 NOTE — ED Notes (Signed)
MD at bedside. 

## 2015-02-02 NOTE — Telephone Encounter (Signed)
Patient was seen today but had to be rushed to hospital. °Patient returned. °Patient needs referral to Neurologist. °Patient needs further instruction on how to follow up from ED Visit. °Please follow up with patient.  °

## 2015-02-02 NOTE — ED Notes (Signed)
perEMS- [facility] when she has a witnessed syncopal episode and then a reported seizure. Pt was postictal upon EMS arrival and remained that way until arrival to ED pt is now alert and able to answer questions. NAD noted seizure pads at bedside

## 2015-02-04 NOTE — Telephone Encounter (Signed)
Pt Notified  Stated has appointment on Friday

## 2015-02-05 ENCOUNTER — Encounter: Payer: Self-pay | Admitting: Neurology

## 2015-02-05 ENCOUNTER — Ambulatory Visit (INDEPENDENT_AMBULATORY_CARE_PROVIDER_SITE_OTHER): Payer: Medicaid Other | Admitting: Neurology

## 2015-02-05 VITALS — BP 105/65 | HR 81 | Ht 63.0 in | Wt 135.6 lb

## 2015-02-05 DIAGNOSIS — R404 Transient alteration of awareness: Secondary | ICD-10-CM

## 2015-02-05 DIAGNOSIS — R569 Unspecified convulsions: Secondary | ICD-10-CM

## 2015-02-05 NOTE — Progress Notes (Signed)
[facility]    Provider:  Dr Lucia Gaskins Referring Provider: [name], MD Primary Care Physician:  Lora Paula, MD  CC:  Loss of consciousness  HPI:  Desiree Gutierrez is a 24 y.o. female here as a referral from Dr. Armen Pickup for multiple seizure-like episodes in over 10 emergency room visits [place] for syncope.She is here with her father. Patient has a history of POTS and prior WPW treated with ablation, vasovagal episodes. Patient has had multiple visits to the ED and has had extensive workup including cardiology evaluation,  Holter monitor, blood work, Echo. She is currently wearing an event monitor.  She was at her doctor's office and was having CP, after about 10 minutes she had SOB, her heart was racing, her doctor came in and patient says she lost consciousness but did not fall out of the chair. HR went to 180 and O2 went to 83%. Recently she had another spisode and she was tachycardic and O2 was 73%. She has had multiple episodes of tachycardia and dec O2 levels, her eyes are closed, she has fallen to the ground on numerous occassions, she lies in a still position, always preceded by CP and tachycardia, no shaking or abnormal movements, no loss of bowel or bladder or tongue biting, she is out for a minute and completely unresponsive, she is not confused afterwards she knows where she is and who is there with her. No FHx of seizures, or seizures as a younger child. She is having the episodes weekly where she loses consciousness. No other focal neurologic deficits. On one episode, notes state that patient was transported by EMS. Per their report patient had atypical convulsive activity in the ambulance, where she was responsive, and yet had ongoing shaking, following directions.   Reviewed notes, labs and imaging from outside physicians, which showed: Patient has had more than 10 emergency room visits for syncope this year. She felt palpitations and shortness of breath  prior to the episodes.. Workup has been normal to date. Patient presented in the ED on 12/02/2014 with lightheadedness and a reported syncopal event. She woke up with EMS standing next to her. Patient has had multiple visits for the same and has had extensive workup including Holter monitor, blood work, Echo, x-rays. Patient was found to be hypoglycemic during one of the visits. She was evaluated and sent home. On September 8 this year she reported to the ED for chest pain and discharged with cardiology follow-up. She had another syncopal event in September 14 and presented to the emergency room. The next syncopal episode was on September 19 her she hit her head. Again palpitations were associated. He presented again to the ED and September 27 for syncopal episode in the gym. She was standing and became pale then fell forward bumping had endoscopy losing consciousness. In the ED exam was normal, EKG was normal, no altered mental status and neurologic exam was normal, she was discharged with cardiology follow-up. She was evaluated again on October 1 in the emergency room for syncopal issue. Workup was negative and she was discharged home all up with her electrophysiologist. Again seen October 6 and the 28th for the same episodes.    On October 31, patient presented to [place] for syncopal episode and convulsions. Patient was at her primary care physician's office. She was about to be examined. Reportedly had an episode where she became tachycardic to 180s, and lost consciousness. This was associated with generalized shaking and decreased LOC for 35-45 seconds.  Patient was transported by EMS. Per their report patient had atypical convulsive activity in the ambulance, where she was responsive, and yet had ongoing shaking. Upon arrival to the emergency department patient initially was nonverbal but was aware and was responding to questions with head nods and shaking of her head only when pressed further and  asked to respond verbally, she appeared hesitant, was about to attend painful stimulus to assess patient's level of consciousness and she promptly began talking.  There is no EEG performed, does not appear neurology was consulted anytime she was in the emergency room.  Ct of the head 10/2014: No acute intracranial abnormalities including mass lesion or mass effect, hydrocephalus, extra-axial fluid collection, midline shift, hemorrhage, or acute infarction, large ischemic events (personally reviewed images)   Review of Systems: Patient complains of symptoms per HPI as well as the following symptoms: Fatigue, chest pain, palpitations, shortness of breath, dizziness, passing out. Pertinent negatives per HPI. All others negative.   Social History   Social History  . Marital Status: Single    Spouse Name: N/A  . Number of Children: 0  . Years of Education: 15   Occupational History  . Physicist, medicalull-time student    Social History Main Topics  . Smoking status: Never Smoker   . Smokeless tobacco: Not on file  . Alcohol Use: No  . Drug Use: No  . Sexual Activity: Not on file   Other Topics Concern  . Not on file   Social History Narrative   Lives at home with mother   Caffeine use: none    Family History  Problem Relation Age of Onset  . Diabetes Mother   . Hypertension Mother   . Stroke Mother   . Hypertension Brother     Past Medical History  Diagnosis Date  . WPW (Wolff-Parkinson-White syndrome)     a. [facility]/p ablation (2011[facility] ([facility] pathway)  . Vasovagal syncope     a. positive tilt 2011 b. failed medical therapy with Florinef, Midodrine, Inderal, Celexa  . POTS (postural orthostatic tachycardia syndrome)   . Palpitations     a. s/p MDT ILR implanted by Dr Alfonso EllisBeaty [facility]  . Headache   . Syncope     recurrent, daily    Past Surgical History  Procedure Laterality Date  . Cardiac electrophysiology study and ablation  2011    [facility]- left posterior pathway ablation for WPW  . Tilt table study  2011    neurally mediated syncope  . Loop recorder implant  01/22/2014    MDT LINQ [facility] for evaluation of palpitations/syncope  . Cardiac surgery      Current Outpatient Prescriptions  Medication Sig Dispense Refill  . Calcium-Magnesium-Vitamin D (CALCIUM MAGNESIUM PO) Take 2 tablets by mouth daily.    . Cholecalciferol (D-5000) 5000 UNITS TABS Take 5,000 Units by mouth daily.    . Coenzyme Q10 (COQ10 PO) Take 1 tablet by mouth daily.    . Cyanocobalamin (RA VITAMIN B-12 TR) 1000 MCG TBCR Take 1,000 mcg by mouth daily.    . IRON PO Take 325 mg by mouth daily.    . Omega-3 1000 MG CAPS Take 1 capsule by mouth daily.    Marland Kitchen. omega-3 acid ethyl esters (LOVAZA) 1 G capsule Take 1 g by mouth daily.    . Probiotic Product (PROBIOTIC PO) Take 1 tablet by mouth daily.    . sodium chloride 1 G tablet  Take 1 g by mouth 2 (two) times daily.     No current facility-administered medications for this visit.    Allergies as of 02/05/2015 - Review Complete 02/05/2015  Allergen Reaction Noted  . Amitiza [lubiprostone] Anaphylaxis, Shortness Of Breath, and Swelling 07/17/2013  . Other Palpitations 02/05/2015  . Ketorolac Palpitations 08/26/2014  . Metoclopramide Other (See Comments) 01/07/2014  . Midodrine Other (See Comments) 05/25/2014  . Fludrocortisone Rash 07/08/2014    Vitals: BP 105/65 mmHg  Pulse 81  Ht  (1.6 m)  Wt 135 lb 9.6 oz (61.508 kg)  BMI 24.03 kg/m2  LMP 01/23/2015 Last Weight:  Wt Readings from Last 1 Encounters:  02/05/15 135 lb 9.6 oz (61.508 kg)   Last Height:   Ht Readings from Last 1 Encounters:  02/05/15  (1.6 m)    Physical exam: Exam: Gen: NAD, conversant, well nourised, well groomed                     CV: RRR, no MRG. No Carotid Bruits. No peripheral edema, warm, nontender Eyes: Conjunctivae clear without exudates or hemorrhage  Neuro: Detailed  Neurologic Exam  Speech:    Speech is normal; fluent and spontaneous with normal comprehension.  Cognition:    The patient is oriented to person, place, and time;     recent and remote memory intact;     language fluent;     normal attention, concentration,     fund of knowledge Cranial Nerves:    The pupils are equal, round, and reactive to light. The fundi are normal and spontaneous venous pulsations are present. Visual fields are full to finger confrontation. Extraocular movements are intact. Trigeminal sensation is intact and the muscles of mastication are normal. The face is symmetric. The palate elevates in the midline. Hearing intact. Voice is normal. Shoulder shrug is normal. The tongue has normal motion without fasciculations.   Coordination:    Normal finger to nose and heel to shin. Normal rapid alternating movements.   Gait:    Heel-toe and tandem gait are normal.   Motor Observation:    No asymmetry, no atrophy, and no involuntary movements noted. Tone:    Normal muscle tone.    Posture:    Posture is normal. normal erect    Strength:    Strength is V/V in the upper and lower limbs.      Sensation: intact to LT     Reflex Exam:  DTR's:    Deep tendon reflexes in the upper and lower extremities are normal bilaterally.   Toes:    The toes are downgoing bilaterally.   Clonus:    Clonus is absent.      Assessment/Plan:  24 year old with multiple episodes of LOC in the setting of tachycardia and hypoxemia. She's been to the emergency room at least 10 times recently with similar episodes. By report, episodes appear more syncopal and cardiac in nature as opposed to seizure like.  Also need to rule out pseudoseizures due to description of some of the episodes: per notes report patient had atypical convulsive activity in the ambulance, where she was responsive, and yet had ongoing shaking. Upon arrival to the emergency department patient initially was nonverbal but was  aware and was responding to questions with head nods and shaking of her head only when pressed further and asked to respond verbally, she appeared hesitant, was about to attend painful stimulus to assess patient's level of consciousness and she  promptly began talking.  will perform a seizure workup. Neuro exam is nonfocal.  - MRI of the brain w/wo contrast - EEG  - if negative order 3-day ambulatory VEEG - Patient is unable to drive, operate heavy machinery, perform activities at heights or participate in water activities until 6 months seizure free   Naomie Dean, MD  [facility] [address] [place], [place] [address]  Phone [phone] Fax [phone]

## 2015-02-05 NOTE — Patient Instructions (Signed)
Overall you are doing fairly well but I do want to suggest a few things today:   Remember to drink plenty of fluid, eat healthy meals and do not skip any meals. Try to eat protein with a every meal and eat a healthy snack such as fruit or nuts in between meals. Try to keep a regular sleep-wake schedule and try to exercise daily, particularly in the form of walking, 20-30 minutes a day, if you can.   As far as diagnostic testing: EEG, MRI of the brain  I would like to see you back after workup complete, sooner if we need to. Please call us with any interim questions, concerns, problems, updates or refill requests.   Our phone number is [phone]. We also have an after hours call service for urgent matters and there is a physician on-call for urgent questions. For any emergencies you know to call 911 or go to the nearest emergency room

## 2015-02-05 NOTE — Progress Notes (Signed)
[facility]. Faxed to [phone]. Received fax confirmation.

## 2015-02-08 ENCOUNTER — Emergency Department (HOSPITAL_COMMUNITY)
Admission: EM | Admit: 2015-02-08 | Discharge: 2015-02-08 | Disposition: A | Discharge status: Home / Self Care | Payer: Medicaid Other | Attending: Emergency Medicine | Admitting: Emergency Medicine

## 2015-02-08 ENCOUNTER — Encounter: Payer: Self-pay | Admitting: Neurology

## 2015-02-08 ENCOUNTER — Emergency Department (HOSPITAL_COMMUNITY): Payer: Medicaid Other

## 2015-02-08 ENCOUNTER — Encounter (HOSPITAL_COMMUNITY): Payer: Self-pay | Admitting: Emergency Medicine

## 2015-02-08 ENCOUNTER — Other Ambulatory Visit: Payer: Self-pay

## 2015-02-08 ENCOUNTER — Other Ambulatory Visit (HOSPITAL_COMMUNITY): Payer: Medicaid Other

## 2015-02-08 DIAGNOSIS — R000 Tachycardia, unspecified: Secondary | ICD-10-CM | POA: Diagnosis not present

## 2015-02-08 DIAGNOSIS — Z79899 Other long term (current) drug therapy: Secondary | ICD-10-CM | POA: Diagnosis not present

## 2015-02-08 DIAGNOSIS — Z8679 Personal history of other diseases of the circulatory system: Secondary | ICD-10-CM | POA: Insufficient documentation

## 2015-02-08 DIAGNOSIS — R079 Chest pain, unspecified: Secondary | ICD-10-CM | POA: Diagnosis present

## 2015-02-08 LAB — I-STAT CHEM 8, ED
BUN: 10 mg/dL (ref 6–20)
Calcium, Ion: 1.21 mmol/L (ref 1.12–1.23)
Chloride: 102 mmol/L (ref 101–111)
Creatinine, Ser: 0.7 mg/dL (ref 0.44–1.00)
GLUCOSE: 91 mg/dL (ref 65–99)
HEMATOCRIT: 38 % (ref 36.0–46.0)
HEMOGLOBIN: 12.9 g/dL (ref 12.0–15.0)
Potassium: 4 mmol/L (ref 3.5–5.1)
SODIUM: 140 mmol/L (ref 135–145)
TCO2: 25 mmol/L (ref 0–100)

## 2015-02-08 LAB — D-DIMER, QUANTITATIVE (NOT AT ARMC): D DIMER QUANT: 0.29 ug{FEU}/mL (ref 0.00–0.48)

## 2015-02-08 LAB — CBC
HCT: 36.4 % (ref 36.0–46.0)
Hemoglobin: 11.8 g/dL — ABNORMAL LOW (ref 12.0–15.0)
MCH: 28.5 pg (ref 26.0–34.0)
MCHC: 32.4 g/dL (ref 30.0–36.0)
MCV: 87.9 fL (ref 78.0–100.0)
Platelets: 182 10*3/uL (ref 150–400)
RBC: 4.14 MIL/uL (ref 3.87–5.11)
RDW: 11.6 % (ref 11.5–15.5)
WBC: 7.6 10*3/uL (ref 4.0–10.5)

## 2015-02-08 MED ORDER — FENTANYL CITRATE (PF) 100 MCG/2ML IJ SOLN
50.0000 ug | Freq: Once | INTRAMUSCULAR | Status: AC
  Administered 2015-02-08: 50 ug via INTRAVENOUS
  Filled 2015-02-08: qty 2

## 2015-02-08 MED ORDER — CARVEDILOL 3.125 MG PO TABS: 3.1250 mg | ORAL_TABLET | Freq: Two times a day (BID) | ORAL | Status: DC

## 2015-02-08 MED ORDER — IOHEXOL 350 MG/ML SOLN
100.0000 mL | Freq: Once | INTRAVENOUS | Status: AC | PRN
  Administered 2015-02-08: 100 mL via INTRAVENOUS

## 2015-02-08 MED ORDER — CARVEDILOL 3.125 MG PO TABS
3.1250 mg | ORAL_TABLET | Freq: Two times a day (BID) | ORAL | Status: DC
  Administered 2015-02-08: 3.125 mg via ORAL
  Filled 2015-02-08 (×2): qty 1

## 2015-02-08 NOTE — ED Notes (Signed)
Pt c/o feeling her heart racing and not feeling well. EKG done and given to Dr Hyacinth MeekerMiller

## 2015-02-08 NOTE — Discharge Instructions (Signed)

## 2015-02-08 NOTE — ED Notes (Signed)
From home via [facility] for CP onset 0600 (hx of WPW) central pain, no radiatio, c/o SOB, is wearing halter monitor, VSS  324 ASA

## 2015-02-08 NOTE — ED Notes (Signed)
Per visitors pt had an episode of unresponsiveness post MD visit, pt A&O x4 upon my assessment, pt appears lethargic, pt aware of plan will continue to monitor

## 2015-02-08 NOTE — H&P (Signed)
Referring Physician:  Ilda BassetDemetri Gutierrez is an 24 y.o. female.                       Chief Complaint: Palpitation,shortness of breath  HPI: 24 year old female with known history of palpitation, WPW,S/P ablation for WPW, near daily syncopal episodes, shortness of breath, hypoglycemia, occasional heavy monthly periods has another episodes of palpitation and near syncopal episode. Patient may have POTS-postural orthostatic tachycardia syndrome and vasovagal syncope not responding to Florinef, midodrine, Inderal and Celexa. She had Loop recorder last year and external event-monitoring last week with heart rate in 120's at times. In ER heart rate of 140-150 have been recorded as sinus tachycardia lasting for few seconds to less than a minute.  Past Medical History  Diagnosis Date  . WPW (Wolff-Parkinson-White syndrome)     a. s/p ablation (2011) [facility] ([facility] lateral pathway)  . Vasovagal syncope     a. positive tilt 2011 b. failed medical therapy with Florinef, Midodrine, Inderal, Celexa  . POTS (postural orthostatic tachycardia syndrome)   . Palpitations     a. s/p MDT ILR implanted by Dr Desiree Gutierrez [facility]  . Headache   . Syncope     recurrent, daily      Past Surgical History  Procedure Laterality Date  . Cardiac electrophysiology study and ablation  2011    [facility]- [facility] pathway ablation for WPW  . Tilt table study  2011    neurally mediated syncope  . Loop recorder implant  01/22/2014    MDT LINQ [facility] for evaluation of palpitations/syncope  . Cardiac surgery      Family History  Problem Relation Age of Onset  . Diabetes Mother   . Hypertension Mother   . Stroke Mother   . Hypertension Brother    Social History:  reports that she has never smoked. She does not have any smokeless tobacco history on file. She reports that she does not drink alcohol or use illicit drugs.  Allergies:  Allergies  Allergen Reactions   . Amitiza [Lubiprostone] Anaphylaxis, Shortness Of Breath and Swelling  . Other Palpitations  . Ketorolac Palpitations  . Metoclopramide Other (See Comments)    Heart raced  . Midodrine Other (See Comments)    migraines  . Fludrocortisone Rash     (Not in a hospital admission)  Results for orders placed or performed during the hospital encounter of 02/08/15 (from the past 48 hour(s))  CBC     Status: Abnormal   Collection Time: 02/08/15  9:34 AM  Result Value Ref Range   WBC 7.6 4.0 - 10.5 K/uL   RBC 4.14 3.87 - 5.11 MIL/uL   Hemoglobin 11.8 (L) 12.0 - 15.0 g/dL   HCT 40.936.4 81.136.0 - 91.446.0 %   MCV 87.9 78.0 - 100.0 fL   MCH 28.5 26.0 - 34.0 pg   MCHC 32.4 30.0 - 36.0 g/dL   RDW 78.211.6 95.611.5 - 21.315.5 %   Platelets 182 150 - 400 K/uL  D-dimer, quantitative (not at [facility])     Status: None   Collection Time: 02/08/15  9:34 AM  Result Value Ref Range   D-Dimer, Quant 0.29 0.00 - 0.48 ug/mL-FEU    Comment:        AT THE INHOUSE ESTABLISHED CUTOFF VALUE OF 0.48 ug/mL FEU, THIS ASSAY HAS BEEN DOCUMENTED IN THE LITERATURE TO HAVE A SENSITIVITY AND NEGATIVE PREDICTIVE VALUE OF AT LEAST  98 TO 99%.  THE TEST RESULT SHOULD BE CORRELATED WITH AN ASSESSMENT OF THE CLINICAL PROBABILITY OF DVT / VTE.   I-stat chem 8, ed     Status: None   Collection Time: 02/08/15  9:38 AM  Result Value Ref Range   Sodium 140 135 - 145 mmol/L   Potassium 4.0 3.5 - 5.1 mmol/L   Chloride 102 101 - 111 mmol/L   BUN 10 6 - 20 mg/dL   Creatinine, Ser 1.61 0.44 - 1.00 mg/dL   Glucose, Bld 91 65 - 99 mg/dL   Calcium, Ion 0.96 0.45 - 1.23 mmol/L   TCO2 25 0 - 100 mmol/L   Hemoglobin 12.9 12.0 - 15.0 g/dL   HCT 40.9 81.1 - 91.4 %   Dg Chest Port 1 View  02/08/2015  CLINICAL DATA:  Centralized chest pain which both the patient up this morning. EXAM: PORTABLE CHEST 1 VIEW COMPARISON:  01/30/2015. FINDINGS: The cardiac silhouette, mediastinal and hilar contours are within normal limits and stable. The lungs are  clear of acute process. No pleural effusion or pneumothorax. The bony thorax is intact. IMPRESSION: No acute cardiopulmonary findings. Electronically Signed   By: Desiree Gutierrez M.D.   On: 02/08/2015 11:48    Review Of Systems No weight gain or loss, No vision change, No hearing loss, +ve chest pain, +ve shortness of breath, + syncopy and dizziness, No COPD, No hepatitis, No kidney stone, No seizures, stroke or psych admission.  Blood pressure 109/59, pulse 103, temperature 97.9 F (36.6 C), temperature source Oral, resp. rate 16, height  (1.6 m), weight 61.236 kg (135 lb), last menstrual period 01/23/2015, SpO2 100 %. Physical exam: HENT: Normocephalic and atraumatic. Brown eyes, conjunctiva-pink, EOM are normal. No scleral icterus. Neck: Normal range of motion.  Cardiovascular: Normal rate and normal heart sounds.III/VI systolic murmur.  Pulmonary/Chest: Effort normal and breath sounds normal.  Abdominal: Soft. She exhibits no distension. There is no tenderness.  Musculoskeletal: Normal range of motion.  Neurological: She is alert and oriented to person, place, and time. Moves all four extremities. Skin: Skin is warm and dry.  Psychiatric: She has a normal mood and affect. Judgment normal.  Nursing note and vitals reviewed.  Assessment/Plan Sinus tachycardia H/O Syncope H/O Hypoglycemia Chest pain WPW S/P ablation Postural orthostatic tachycardia syndrome  Awaiting CT chest angio result Carvedilol 3.125 mg. one twice daily. Drink extra fluids Frequent small feedings to avoid hypoglycemia Monitor for 1-2 hour post B-blocker use.   Desiree Rodriguez, MD  02/08/2015, 12:53 PM

## 2015-02-08 NOTE — ED Notes (Signed)
IV team at bedside 

## 2015-02-08 NOTE — ED Notes (Signed)
Awaiting D dimer results prior to CTA per MD

## 2015-02-08 NOTE — ED Notes (Signed)
Admitting at bedside 

## 2015-02-08 NOTE — ED Provider Notes (Signed)
CSN: [id number]     Arrival date & time 02/08/15  0813 History   First MD Initiated Contact with Patient 02/08/15 [phone]     Chief Complaint  Patient presents with  . Chest Pain     (Consider location/radiation/quality/duration/timing/severity/associated sxs/prior Treatment) HPI Comments: The patient is a 24 year old female, she has a known history of pots, a known history of Wolff-Parkinson-White status post ablation 3 2012, is [facility] as [place] [facility] [place]. She has recently been started on a Holter monitor which she had to activate this morning when she had acute onset of chest pain and shortness of breath which awoke her from sleep. The patient denies any other symptoms, they have gradually improved but are still present. Of note the patient has been seen multiple times in the emergency department for syncope, seizure like activity likely related to the pots. She has followed up with neurology recently, she was scheduled for an EEG as well as an MRI, there are no results available. She denies any syncope this morning. She has no fever, no coughing, no swelling of the legs, denies any other risk factors for pulmonary embolism.  Patient is a 24 y.o. female presenting with chest pain. The history is provided by the patient.  Chest Pain   Past Medical History  Diagnosis Date  . WPW (Wolff-Parkinson-White syndrome)     a. s/p ablation (2011) [facility] (posterior lateral pathway)  . Vasovagal syncope     a. positive tilt 2011 b. failed medical therapy with Florinef, Midodrine, Inderal, Celexa  . POTS (postural orthostatic tachycardia syndrome)   . Palpitations     a. s/p MDT ILR implanted by Dr Alfonso EllisBeaty [facility]  . Headache   . Syncope     recurrent, daily   Past Surgical History  Procedure Laterality Date  . Cardiac electrophysiology study and ablation  2011    [facility]- left posterior pathway  ablation for WPW  . Tilt table study  2011    neurally mediated syncope  . Loop recorder implant  01/22/2014    MDT LINQ [facility] for evaluation of palpitations/syncope  . Cardiac surgery     Family History  Problem Relation Age of Onset  . Diabetes Mother   . Hypertension Mother   . Stroke Mother   . Hypertension Brother    Social History  Substance Use Topics  . Smoking status: Never Smoker   . Smokeless tobacco: None  . Alcohol Use: No   OB History    No data available     Review of Systems  Cardiovascular: Positive for chest pain.  All other systems reviewed and are negative.     Allergies  Amitiza; Other; Ketorolac; Metoclopramide; Midodrine; and Fludrocortisone  Home Medications   Prior to Admission medications   Medication Sig Start Date End Date Taking? Authorizing Provider  Calcium-Magnesium-Vitamin D (CALCIUM MAGNESIUM PO) Take 2 tablets by mouth daily.   Yes Historical Provider, MD  Cholecalciferol (D-5000) 5000 UNITS TABS Take 5,000 Units by mouth daily.   Yes Historical Provider, MD  Coenzyme Q10 (COQ10 PO) Take 1 tablet by mouth daily.   Yes Historical Provider, MD  Cyanocobalamin (RA VITAMIN B-12 TR) 1000 MCG TBCR Take 1,000 mcg by mouth daily.   Yes Historical Provider, MD  IRON PO Take 325 mg by mouth daily.   Yes Historical Provider, MD  omega-3 acid  ethyl esters (LOVAZA) 1 G capsule Take 1 g by mouth daily.   Yes Historical Provider, MD  Probiotic Product (PROBIOTIC PO) Take 1 tablet by mouth daily.   Yes Historical Provider, MD  sodium chloride 1 G tablet Take 1 g by mouth 2 (two) times daily.   Yes Historical Provider, MD  carvedilol (COREG) 3.125 MG tablet Take 1 tablet (3.125 mg total) by mouth 2 (two) times daily with a meal. 02/08/15   Orpah Cobb, MD  carvedilol (COREG) 3.125 MG tablet Take 1 tablet (3.125 mg total) by mouth 2 (two) times daily with a meal. 02/08/15   Eber Hong, MD  Omega-3 1000 MG CAPS Take 1 capsule  by mouth daily.    Historical Provider, MD   BP 105/61 mmHg  Pulse 87  Temp(Src) 97.9 F (36.6 C) (Oral)  Resp 21  Ht  (1.6 m)  Wt 135 lb (61.236 kg)  BMI 23.92 kg/m2  SpO2 100%  LMP 01/23/2015 Physical Exam  Constitutional: She appears well-developed and well-nourished. No distress.  HENT:  Head: Normocephalic and atraumatic.  Mouth/Throat: Oropharynx is clear and moist. No oropharyngeal exudate.  Eyes: Conjunctivae and EOM are normal. Pupils are equal, round, and reactive to light. Right eye exhibits no discharge. Left eye exhibits no discharge. No scleral icterus.  Neck: Normal range of motion. Neck supple. No JVD present. No thyromegaly present.  Cardiovascular: Normal rate, regular rhythm, normal heart sounds and intact distal pulses.  Exam reveals no gallop and no friction rub.   No murmur heard. Pulmonary/Chest: Effort normal and breath sounds normal. No respiratory distress. She has no wheezes. She has no rales.  Abdominal: Soft. Bowel sounds are normal. She exhibits no distension and no mass. There is no tenderness.  Musculoskeletal: Normal range of motion. She exhibits no edema or tenderness.  Lymphadenopathy:    She has no cervical adenopathy.  Neurological: She is alert. Coordination normal.  Skin: Skin is warm and dry. No rash noted. No erythema.  Psychiatric: She has a normal mood and affect. Her behavior is normal.  Nursing note and vitals reviewed.   ED Course  Procedures (including critical care time) Labs Review Labs Reviewed  CBC - Abnormal; Notable for the following:    Hemoglobin 11.8 (*)    All other components within normal limits  D-DIMER, QUANTITATIVE (NOT AT [facility])  I-STAT CHEM 8, ED    Imaging Review Ct Angio Chest Pe W/cm &/or Wo Cm  02/08/2015  CLINICAL DATA:  Midline upper chest pain radiating to the upper back. POTS disease. Wolfe-Parkinson White syndrome. EXAM: CT ANGIOGRAPHY CHEST WITH CONTRAST TECHNIQUE: Multidetector CT imaging of  the chest was performed using the standard protocol during bolus administration of intravenous contrast. Multiplanar CT image reconstructions and MIPs were obtained to evaluate the vascular anatomy. CONTRAST:  OMNIPAQUE IOHEXOL 350 MG/ML SOLN COMPARISON:  Portable chest obtained earlier today and chest CTA dated 02/09/2011. FINDINGS: Mediastinum/Lymph Nodes: No pulmonary emboli or thoracic aortic dissection identified. No masses or pathologically enlarged lymph nodes identified. Lungs/Pleura: No pulmonary mass, infiltrate, or effusion. Upper abdomen: No acute findings. Musculoskeletal: Normal appearing bones. Review of the MIP images confirms the above findings. IMPRESSION: Normal examination. Electronically Signed   By: Beckie Salts M.D.   On: 02/08/2015 14:19   Dg Chest Port 1 View  02/08/2015  CLINICAL DATA:  Centralized chest pain which both the patient up this morning. EXAM: PORTABLE CHEST 1 VIEW COMPARISON:  01/30/2015. FINDINGS: The cardiac silhouette, mediastinal and hilar  contours are within normal limits and stable. The lungs are clear of acute process. No pleural effusion or pneumothorax. The bony thorax is intact. IMPRESSION: No acute cardiopulmonary findings. Electronically Signed   By: Rudie Meyer M.D.   On: 02/08/2015 11:48   I have personally reviewed and evaluated these images and lab results as part of my medical decision-making.    MDM   Final diagnoses:  Tachycardia    The patient appears well, when she sits up or has a coughing fit her heart rate jumps from 80-120 and then goes back down after she calms down. As far as I can tell she has not recently been evaluated for pulmonary embolism which would be the only other significant thing on the differential, I do not believe this is cardiac disease, her EKG does not reveal any ischemia, her vital signs are totally normal, pursue CT scan angiogram. Patient is in agreement with the plan. We'll investigate her Holter monitor  strip from this morning by contacting the company.  ED ECG REPORT  I personally interpreted this EKG   Date: 02/08/2015   Rate: 94  Rhythm: normal sinus rhythm  QRS Axis: normal  Intervals: normal  ST/T Wave abnormalities: normal  Conduction Disutrbances:none  Narrative Interpretation:   Old EKG Reviewed: unchanged  Pt has been seen by cardiology - cleared - unsure of etiology - he started coreg - Appreciated Dr. Roseanne Kaufman bedside evaluation.   Rec:  Home with coreg.  Eber Hong, MD 02/08/15 [phone]

## 2015-02-08 NOTE — ED Notes (Signed)
Spoke with Zella Ballobin with the monitor service, Hyacinth MeekerMiller, MD aware of results, cardiac strips to be faxed to our facility

## 2015-02-13 ENCOUNTER — Encounter: Payer: Self-pay | Admitting: Family Medicine

## 2015-02-13 ENCOUNTER — Ambulatory Visit: Payer: Medicaid Other | Attending: Family Medicine | Admitting: Family Medicine

## 2015-02-13 ENCOUNTER — Other Ambulatory Visit: Payer: Self-pay | Admitting: Pharmacist

## 2015-02-13 ENCOUNTER — Other Ambulatory Visit: Payer: Self-pay | Admitting: Neurology

## 2015-02-13 ENCOUNTER — Ambulatory Visit
Admission: RE | Admit: 2015-02-13 | Discharge: 2015-02-13 | Disposition: A | Discharge status: Home / Self Care | Payer: Medicaid Other | Source: Ambulatory Visit | Attending: Neurology | Admitting: Neurology

## 2015-02-13 VITALS — BP 107/69 | HR 80 | Temp 99.0°F | Resp 16 | Wt 138.0 lb

## 2015-02-13 DIAGNOSIS — R404 Transient alteration of awareness: Secondary | ICD-10-CM | POA: Diagnosis not present

## 2015-02-13 DIAGNOSIS — K5901 Slow transit constipation: Secondary | ICD-10-CM | POA: Diagnosis not present

## 2015-02-13 DIAGNOSIS — E162 Hypoglycemia, unspecified: Secondary | ICD-10-CM

## 2015-02-13 DIAGNOSIS — R569 Unspecified convulsions: Secondary | ICD-10-CM

## 2015-02-13 DIAGNOSIS — K0889 Other specified disorders of teeth and supporting structures: Secondary | ICD-10-CM

## 2015-02-13 LAB — GLUCOSE, POCT (MANUAL RESULT ENTRY): POC Glucose: 64 mg/dl — AB (ref 70–99)

## 2015-02-13 MED ORDER — POLYETHYLENE GLYCOL 3350 17 GM/SCOOP PO POWD: 17.0000 g | Freq: Two times a day (BID) | ORAL | Status: DC | PRN

## 2015-02-13 NOTE — Progress Notes (Signed)
F/U ED -SHOB and HR Back pain mid back x6 Pain scale #6

## 2015-02-13 NOTE — Patient Instructions (Addendum)
Desiree Gutierrez was seen today for hospitalization follow-up.  Diagnoses and all orders for this visit:  Hypoglycemia -     POCT glucose (manual entry)  Slow transit constipation -     polyethylene glycol powder (GLYCOLAX/MIRALAX) powder; Take 17 g by mouth 2 (two) times daily as needed.  Pain of molar -     Ambulatory referral to Dentistry   I will call Dr. Alfonso EllisBeaty about adding a benzo and call you back  F/u in one month   Dr. Armen PickupFunches

## 2015-02-13 NOTE — Progress Notes (Signed)
Patient ID: Desiree Gutierrez, female   DOB: 06/16/1990, 24 y.o.   MRN: [id number]   Subjective:  Patient ID: Desiree Gutierrez, female    DOB: 11/29/1990  Age: 24 y.o. MRN: [id number]  CC: Hospitalization Follow-up   HPI Desiree Gutierrez presents for syncope, tremors, tachycardia. She has been to ED twice since last OV. She has seen neurologist. She has MRI scheduled for today. She has been started on coreg. Her primary cardiologist has advised her not to take coreg. She reports persistent substernal CP that radiates to back and worsens when she has tachycardia. She denies anxiety. She has f.u with her cardiologist (Dr. Alfonso EllisBeaty) next month.   Social History  Substance Use Topics  . Smoking status: Never Smoker   . Smokeless tobacco: Not on file  . Alcohol Use: No    Outpatient Prescriptions Prior to Visit  Medication Sig Dispense Refill  . Calcium-Magnesium-Vitamin D (CALCIUM MAGNESIUM PO) Take 2 tablets by mouth daily.    . carvedilol (COREG) 3.125 MG tablet Take 1 tablet (3.125 mg total) by mouth 2 (two) times daily with a meal. 60 tablet 1  . Cholecalciferol (D-5000) 5000 UNITS TABS Take 5,000 Units by mouth daily.    . Coenzyme Q10 (COQ10 PO) Take 1 tablet by mouth daily.    . Cyanocobalamin (RA VITAMIN B-12 TR) 1000 MCG TBCR Take 1,000 mcg by mouth daily.    . IRON PO Take 325 mg by mouth daily.    . Omega-3 1000 MG CAPS Take 1 capsule by mouth daily.    Marland Kitchen. omega-3 acid ethyl esters (LOVAZA) 1 G capsule Take 1 g by mouth daily.    . Probiotic Product (PROBIOTIC PO) Take 1 tablet by mouth daily.    . sodium chloride 1 G tablet Take 1 g by mouth 2 (two) times daily.    . carvedilol (COREG) 3.125 MG tablet Take 1 tablet (3.125 mg total) by mouth 2 (two) times daily with a meal. (Patient not taking: Reported on 02/13/2015) 60 tablet 0   No facility-administered medications prior to visit.    ROS Review of Systems  Constitutional: Negative for fever and chills.  HENT: Positive for dental  problem.   Eyes: Negative for visual disturbance.  Respiratory: Negative for shortness of breath.   Cardiovascular: Positive for chest pain.  Gastrointestinal: Positive for constipation. Negative for abdominal pain and blood in stool.  Musculoskeletal: Negative for back pain and arthralgias.  Skin: Negative for rash.  Allergic/Immunologic: Negative for immunocompromised state.  Hematological: Negative for adenopathy. Does not bruise/bleed easily.  Psychiatric/Behavioral: Negative for suicidal ideas and dysphoric mood.    Objective:  BP 107/69 mmHg  Pulse 80  Temp(Src) 99 F (37.2 C) (Oral)  Resp 16  Wt 138 lb (62.596 kg)  SpO2 96%  LMP 01/23/2015  BP/Weight 02/13/2015 02/08/2015 02/05/2015  Systolic BP 107 107 105  Diastolic BP 69 63 65  Wt. (Lbs) 138 135 135.6  BMI 24.45 23.92 24.03    Physical Exam  Constitutional: She is oriented to person, place, and time. She appears well-developed and well-nourished. No distress.  HENT:  Head: Normocephalic and atraumatic.  Cardiovascular: Normal rate, regular rhythm, normal heart sounds and intact distal pulses.   Pulmonary/Chest: Effort normal and breath sounds normal.  Musculoskeletal: She exhibits no edema.  Neurological: She is alert and oriented to person, place, and time.  Skin: Skin is warm and dry. No rash noted.  Psychiatric: She has a normal mood and affect.    Assessment &  Plan:   Problem List Items Addressed This Visit    Constipation (Chronic)   Hypoglycemia - Primary (Chronic)   Relevant Orders   POCT glucose (manual entry) (Completed)   Pain of molar (Chronic)   Relevant Orders   Ambulatory referral to Dentistry      No orders of the defined types were placed in this encounter.    Follow-up: No Follow-up on file.   [name] MD

## 2015-02-17 ENCOUNTER — Telehealth: Payer: Self-pay | Admitting: *Deleted

## 2015-02-17 NOTE — Telephone Encounter (Signed)
-----   Message from Anson FretAntonia B Ahern, MD sent at 02/17/2015  8:47 AM EST ----- MRI of the brain was normal thanks

## 2015-02-17 NOTE — Telephone Encounter (Signed)
Called and spoke to pt about normal MRI brain. Pt verbalized understanding.  

## 2015-02-23 ENCOUNTER — Ambulatory Visit (HOSPITAL_COMMUNITY)
Admission: RE | Admit: 2015-02-23 | Discharge: 2015-02-23 | Disposition: A | Discharge status: Home / Self Care | Payer: Medicaid Other | Source: Ambulatory Visit | Attending: Neurology | Admitting: Neurology

## 2015-02-23 DIAGNOSIS — R404 Transient alteration of awareness: Secondary | ICD-10-CM | POA: Diagnosis not present

## 2015-02-23 DIAGNOSIS — R569 Unspecified convulsions: Secondary | ICD-10-CM | POA: Diagnosis not present

## 2015-02-23 NOTE — Progress Notes (Signed)
EEG Completed; Results Pending  

## 2015-02-23 NOTE — Procedures (Signed)
ELECTROENCEPHALOGRAM REPORT  Date of Study: 02/23/2015  Patient's Name: Desiree BassetDemetri Surrette MRN: [id number] Date of Birth: 1990/12/02  Referring Provider: Dr. Naomie DeanAntonia Ahern  Clinical History: This is a 24 year old woman with near daily syncopal episodes.   Medications: Calcium-Magnesium-Vitamin D (CALCIUM MAGNESIUM PO) Take 2 tablets by mouth daily.  Cholecalciferol (D-5000) 5000 UNITS TABS Take 5,000 Units by mouth daily. Coenzyme Q10 (COQ10 PO) Take 1 tablet by mouth daily. Cyanocobalamin (RA VITAMIN B-12 TR) 1000 MCG TBCR Take 1,000 mcg by mouth daily. IRON PO Take 325 mg by mouth daily. omega-3 acid ethyl esters (LOVAZA) 1 G capsule Take 1 g by mouth daily. Probiotic Product (PROBIOTIC PO) Take 1 tablet by mouth daily. sodium chloride 1 G tablet   Technical Summary: A multichannel digital EEG recording measured by the international 10-20 system with electrodes applied with paste and impedances below 5000 ohms performed in our laboratory with EKG monitoring in an awake and asleep patient.  Hyperventilation and photic stimulation were performed.  The digital EEG was referentially recorded, reformatted, and digitally filtered in a variety of bipolar and referential montages for optimal display.    Description: The patient is awake and asleep during the recording.  During maximal wakefulness, there is a symmetric, medium voltage 10 Hz posterior dominant rhythm that attenuates with eye opening.  The record is symmetric.  During drowsiness and stage I sleep, there is an increase in theta slowing of the background with vertex waves seen.  Hyperventilation and photic stimulation did not elicit any abnormalities.  There were no epileptiform discharges or electrographic seizures seen.    EKG lead was unremarkable.  Impression: This awake and asleep EEG is normal.    Clinical Correlation: A normal EEG does not exclude a clinical diagnosis of epilepsy. Clinical correlation is advised.   Patrcia DollyKaren  Veatrice Eckstein, M.D.

## 2015-02-24 ENCOUNTER — Telehealth: Payer: Self-pay | Admitting: *Deleted

## 2015-02-24 NOTE — Telephone Encounter (Signed)
Received referral status notification from [facility]: referral received and they will notify our office once pt has been scheduled for 72hr AMB EEG.

## 2015-02-24 NOTE — Telephone Encounter (Signed)
-----   Message from Anson FretAntonia B Ahern, MD sent at 02/23/2015  5:51 PM EST ----- Let patient know eeg is normal. I think this is cardiac in nature however I did discuss a 3-day video eeg with patient and her father and still willing to order that. thanks

## 2015-02-24 NOTE — Telephone Encounter (Signed)
Called pt and advised EEG normal per Dr Ahern. Dr Lucia GaskinsAhern thinks it is a cardiac eveLucia Gaskinsnt, but she wants to still do 3 day AMB EEG in home still. Explained process on how this is done to pt. She knows to expect call from [facility] to schedule appt. [facility] and has procedure [place] after [place].

## 2015-02-24 NOTE — Telephone Encounter (Signed)
Faxed referral to [facility] to schedule pt for 72-hr AMB EEG. Received fax confirmation.

## 2015-02-26 ENCOUNTER — Encounter (HOSPITAL_COMMUNITY): Payer: Self-pay | Admitting: *Deleted

## 2015-02-26 ENCOUNTER — Emergency Department (HOSPITAL_COMMUNITY)
Admission: EM | Admit: 2015-02-26 | Discharge: 2015-02-26 | Disposition: A | Discharge status: Home / Self Care | Payer: Medicaid Other | Attending: Emergency Medicine | Admitting: Emergency Medicine

## 2015-02-26 DIAGNOSIS — R002 Palpitations: Secondary | ICD-10-CM | POA: Insufficient documentation

## 2015-02-26 DIAGNOSIS — R0602 Shortness of breath: Secondary | ICD-10-CM | POA: Diagnosis not present

## 2015-02-26 DIAGNOSIS — Z79899 Other long term (current) drug therapy: Secondary | ICD-10-CM | POA: Diagnosis not present

## 2015-02-26 DIAGNOSIS — R55 Syncope and collapse: Secondary | ICD-10-CM | POA: Diagnosis present

## 2015-02-26 DIAGNOSIS — Z3202 Encounter for pregnancy test, result negative: Secondary | ICD-10-CM | POA: Insufficient documentation

## 2015-02-26 LAB — BASIC METABOLIC PANEL
Anion gap: 7 (ref 5–15)
BUN: 13 mg/dL (ref 6–20)
CO2: 27 mmol/L (ref 22–32)
Calcium: 9.7 mg/dL (ref 8.9–10.3)
Chloride: 105 mmol/L (ref 101–111)
Creatinine, Ser: 0.84 mg/dL (ref 0.44–1.00)
GFR calc Af Amer: >60 mL/min (ref >60)
GFR calc non Af Amer: >60 mL/min (ref >60)
Glucose, Bld: 69 mg/dL (ref 65–99)
Potassium: 3.9 mmol/L (ref 3.5–5.1)
Sodium: 139 mmol/L (ref 135–145)

## 2015-02-26 LAB — URINALYSIS, ROUTINE W REFLEX MICROSCOPIC
Bilirubin Urine: NEGATIVE
Glucose, UA: NEGATIVE mg/dL
Ketones, ur: NEGATIVE mg/dL
Leukocytes, UA: NEGATIVE
Nitrite: NEGATIVE
Protein, ur: NEGATIVE mg/dL
Specific Gravity, Urine: 1.007 (ref 1.005–1.030)
pH: 6.5 (ref 5.0–8.0)

## 2015-02-26 LAB — CBC
HCT: 34.2 % — ABNORMAL LOW (ref 36.0–46.0)
Hemoglobin: 11.1 g/dL — ABNORMAL LOW (ref 12.0–15.0)
MCH: 29.1 pg (ref 26.0–34.0)
MCHC: 32.5 g/dL (ref 30.0–36.0)
MCV: 89.8 fL (ref 78.0–100.0)
Platelets: 197 10*3/uL (ref 150–400)
RBC: 3.81 MIL/uL — ABNORMAL LOW (ref 3.87–5.11)
RDW: 11.9 % (ref 11.5–15.5)
WBC: 7.2 10*3/uL (ref 4.0–10.5)

## 2015-02-26 LAB — URINE MICROSCOPIC-ADD ON
BACTERIA UA: NONE SEEN
WBC UA: NONE SEEN WBC/hpf (ref 0–5)

## 2015-02-26 LAB — POC URINE PREG, ED: Preg Test, Ur: NEGATIVE

## 2015-02-26 NOTE — ED Notes (Signed)
Pt with hx of WPW and POTS; last cardiac ablation in 2011. C/o syncopal episode and sob. No injuries from fall.

## 2015-02-26 NOTE — Discharge Instructions (Signed)
Follow-up with your cardiologist at your scheduled appointment time.  Call your cardiologist to inform them that you have been seen in the emergency department for passing out with shortness of breath.

## 2015-02-26 NOTE — ED Notes (Signed)
Pt had to use the bathroom. A bedside commode was brought to pt's bedside.

## 2015-02-26 NOTE — ED Notes (Signed)
Pt to ED c/o syncopal episode while washing her hands. Hx of syncope and WPW. [facility]. C/o shortness of breath onset after syncopal episode. Pt wore a halter monitor for episodes and was taken off monitor last week by cardiologist. Denies chest pain, alert and oriented x4. Assisted to bedside commode on arrival with minimal assistance

## 2015-02-26 NOTE — ED Provider Notes (Signed)
CSN: [id number]     Arrival date & time 02/26/15  2050 History   First MD Initiated Contact with Patient 02/26/15 2127     Chief Complaint  Patient presents with  . Near Syncope     (Consider location/radiation/quality/duration/timing/severity/associated sxs/prior Treatment) HPI  Patient is a 24 year old female with past medical history significant for WPW  S/p ablation 2011, POTS, heart palpitations, recurrent syncope, who presents to the emergency department following a syncopal episode. Patient reports that she was standing in the bathroom when she had a sudden onset of heart palpitations associated with shortness of breath. The next thing she remembered she woke up on the bathroom floor to her family standing over her. Family is with her at bedside, denies any seizure-like activity, no tongue biting, no urinary or bowel incontinence. She states that this is similar to her previous episodes of syncope. States that they occur multiple times a week. Patient states she has been followed by cardiology, recently had a Holter monitor. He reports that they have made some recent medication changes, but that she continues to have recurring palpitations daily. Denies fevers, chills, chest pain, cough, congestion, nausea, vomiting, numbness, weakness of extremities. Reports that she only has shortness of breath when the heart palpitations occur. Denies shortness of breath at rest. Denies history of DVT/PE. No recent surgery or immobilization. No family history of sudden cardiac death.  Past Medical History  Diagnosis Date  . WPW (Wolff-Parkinson-White syndrome)     a. s/p ablation (2011) [facility] (posterior lateral pathway)  . Vasovagal syncope     a. positive tilt 2011 b. failed medical therapy with Florinef, Midodrine, Inderal, Celexa  . POTS (postural orthostatic tachycardia syndrome)   . Palpitations     a. s/p MDT ILR implanted by Dr [facility]  . Headache   . Syncope      recurrent, daily   Past Surgical History  Procedure Laterality Date  . Cardiac electrophysiology study and ablation  2011    [facility]- left posterior pathway ablation for WPW  . Tilt table study  2011    neurally mediated syncope  . Loop recorder implant  01/22/2014    MDT LINQ [facility]/syncope  . Cardiac surgery     Family History  Problem Relation Age of Onset  . Diabetes Mother   . Hypertension Mother   . Stroke Mother   . Hypertension Brother   . Seizures Neg Hx    Social History  Substance Use Topics  . Smoking status: Never Smoker   . Smokeless tobacco: None  . Alcohol Use: No   OB History    No data available     Review of Systems  Constitutional: Negative for fever and appetite change.  HENT: Negative for congestion.   Eyes: Negative for visual disturbance.  Respiratory: Positive for shortness of breath. Negative for cough and chest tightness.   Cardiovascular: Positive for palpitations. Negative for chest pain.  Gastrointestinal: Negative for vomiting, abdominal pain and diarrhea.  Genitourinary: Negative for dysuria, urgency, hematuria and flank pain.  Musculoskeletal: Negative for back pain.  Skin: Negative for rash.  Neurological: Positive for syncope. Negative for dizziness, seizures, weakness, light-headedness and headaches.  Psychiatric/Behavioral: Negative for behavioral problems.      Allergies  Amitiza; Other; Ketorolac; Metoclopramide; Midodrine; and Fludrocortisone  Home Medications   Prior to Admission medications   Medication Sig Start Date End Date Taking? Authorizing Provider  Calcium-Magnesium-Vitamin D (CALCIUM MAGNESIUM PO) Take 2 tablets by mouth daily.   Yes Historical Provider, MD  Cholecalciferol (D-5000) 5000 UNITS TABS Take 5,000 Units by mouth daily.   Yes Historical Provider, MD  Coenzyme Q10 (COQ10 PO) Take 1 tablet by mouth daily.   Yes Historical Provider,  MD  Cyanocobalamin (RA VITAMIN B-12 TR) 1000 MCG TBCR Take 1,000 mcg by mouth daily.   Yes Historical Provider, MD  IRON PO Take 325 mg by mouth daily.   Yes Historical Provider, MD  Omega-3 1000 MG CAPS Take 1 capsule by mouth daily.   Yes Historical Provider, MD  omega-3 acid ethyl esters (LOVAZA) 1 G capsule Take 1 g by mouth daily.   Yes Historical Provider, MD  polyethylene glycol powder (GLYCOLAX/MIRALAX) powder Take 17 g by mouth 2 (two) times daily as needed. 02/13/15  Yes [name], MD  Probiotic Product (PROBIOTIC PO) Take 1 tablet by mouth daily.   Yes Historical Provider, MD  sodium chloride 1 G tablet Take 1 g by mouth 2 (two) times daily.   Yes Historical Provider, MD   BP 122/84 mmHg  Pulse 87  Temp(Src) 98.3 F (36.8 C) (Oral)  Resp 18  SpO2 100%  LMP 02/23/2015 Physical Exam  Constitutional: She is oriented to person, place, and time. She appears well-developed and well-nourished. No distress.  HENT:  Head: Normocephalic and atraumatic.  Mouth/Throat: Oropharynx is clear and moist.  Eyes: Conjunctivae and EOM are normal. Pupils are equal, round, and reactive to light.  Neck: Normal range of motion. Neck supple. No JVD present. No tracheal deviation present.  Cardiovascular: Normal rate, regular rhythm, normal heart sounds and intact distal pulses.   Pulmonary/Chest: Effort normal and breath sounds normal. No respiratory distress. She has no wheezes. She exhibits no tenderness.  Abdominal: Soft. Bowel sounds are normal. She exhibits no distension. There is no tenderness.  Musculoskeletal: Normal range of motion.  Neurological: She is alert and oriented to person, place, and time.  Skin: Skin is warm. No pallor.  Psychiatric: She has a normal mood and affect.  Nursing note and vitals reviewed.   ED Course  Procedures (including critical care time) Labs Review Labs Reviewed  CBC - Abnormal; Notable for the following:    RBC 3.81 (*)    Hemoglobin 11.1 (*)     HCT 34.2 (*)    All other components within normal limits  URINALYSIS, ROUTINE W REFLEX MICROSCOPIC (NOT AT [facility]) - Abnormal; Notable for the following:    Hgb urine dipstick MODERATE (*)    All other components within normal limits  URINE MICROSCOPIC-ADD ON - Abnormal; Notable for the following:    Squamous Epithelial / LPF 0-5 (*)    All other components within normal limits  BASIC METABOLIC PANEL  POC URINE PREG, ED    Imaging Review No results found. I have personally reviewed and evaluated these images and lab results as part of my medical decision-making.   EKG Interpretation   Date/Time:  Thursday February 26 2015 21:08:02 EST Ventricular Rate:  96 PR Interval:  130 QRS Duration: 67 QT Interval:  342 QTC Calculation: 432 R Axis:   74 Text Interpretation:  Sinus rhythm diffuse ST depression noted on previous  has resolved.  Confirmed by Juleen China  MD, STEPHEN [phone]) on 02/26/2015  9:58:09 PM      MDM   Final diagnoses:  Syncope, unspecified syncope type  Palpitation   Patient is a 24 year old female with past medical history significant  for WPW  S/p ablation 2011, POTS, heart palpitations, recurrent syncope, who presents to the emergency department following a syncopal episode. On arrival no acute distress, not ill appearing. Afebrile, hemodynamically stable, SpO2 > 95% on RA. Exam as above, RRR, lungs clear to auscultation bilaterally, intact distal pulses, no reproducible chest pain, normal neurologic exam. Does not appear clinically dehydrated.  Patient with recurring syncopal episodes associated with heart palpitations. Patient has been seen multiple times in the emergency department and is followed by cardiology, patient's cardiologist is at [facility]. Patient has undergone an extensive workup for syncopal episodes and heart palpitations. Patient has had a loop recorder and a Holter monitor within the past month. Patient has not had any change in presentation of  syncopal episodes or heart palpitations since being evaluated by cardiology less than a month ago.  Basic labs showed no acute findings, no leukocytosis, no electrolyte abnormalities, no anemia. Urine pregnancy test negative. UA showed no signs of UTI. EKG showed normal sinus rhythm, normal intervals, no chamber enlargement, no signs of ischemia, diffuse ST depression noted on previous EKGs has resolved. No signs of prolonged QT syndrome, Brugada syndrome, AV block, A. fib/A flutter, HOCM.  Do not feel that the patient needs evaluation by cardiology at this time.    Discussed with the patient that she should call her cardiologist for close follow-up. Patient has an appointment scheduled in one week. Discussed strict return precautions to the emergency department for syncopal episodes, chest pain, worsening shortness of breath. Patient stable for discharge home, normal vital signs at time of discharge. No questions or concerns at time of discharge.      Corena Herter, MD 03/01/15 1610  Raeford Razor, MD 03/13/15 [phone]

## 2015-03-10 ENCOUNTER — Emergency Department (HOSPITAL_COMMUNITY)
Admission: EM | Admit: 2015-03-10 | Discharge: 2015-03-10 | Disposition: A | Discharge status: Home / Self Care | Payer: Medicaid Other | Attending: Emergency Medicine | Admitting: Emergency Medicine

## 2015-03-10 ENCOUNTER — Encounter (HOSPITAL_COMMUNITY): Payer: Self-pay | Admitting: Emergency Medicine

## 2015-03-10 DIAGNOSIS — Z8679 Personal history of other diseases of the circulatory system: Secondary | ICD-10-CM | POA: Diagnosis not present

## 2015-03-10 DIAGNOSIS — Z720 Tobacco use: Secondary | ICD-10-CM

## 2015-03-10 DIAGNOSIS — R0602 Shortness of breath: Secondary | ICD-10-CM | POA: Insufficient documentation

## 2015-03-10 DIAGNOSIS — R002 Palpitations: Secondary | ICD-10-CM | POA: Diagnosis not present

## 2015-03-10 DIAGNOSIS — R55 Syncope and collapse: Secondary | ICD-10-CM | POA: Insufficient documentation

## 2015-03-10 DIAGNOSIS — R42 Dizziness and giddiness: Secondary | ICD-10-CM | POA: Diagnosis not present

## 2015-03-10 DIAGNOSIS — F17210 Nicotine dependence, cigarettes, uncomplicated: Secondary | ICD-10-CM | POA: Diagnosis not present

## 2015-03-10 DIAGNOSIS — Z79899 Other long term (current) drug therapy: Secondary | ICD-10-CM | POA: Insufficient documentation

## 2015-03-10 LAB — I-STAT BETA HCG BLOOD, ED (MC, WL, AP ONLY)

## 2015-03-10 LAB — URINALYSIS, ROUTINE W REFLEX MICROSCOPIC
Bilirubin Urine: NEGATIVE
Glucose, UA: NEGATIVE mg/dL
Hgb urine dipstick: NEGATIVE
Ketones, ur: NEGATIVE mg/dL
LEUKOCYTES UA: NEGATIVE
Nitrite: NEGATIVE
PROTEIN: NEGATIVE mg/dL
Specific Gravity, Urine: 1.006 (ref 1.005–1.030)
pH: 7.5 (ref 5.0–8.0)

## 2015-03-10 LAB — CBC
HCT: 35.8 % — ABNORMAL LOW (ref 36.0–46.0)
Hemoglobin: 11.4 g/dL — ABNORMAL LOW (ref 12.0–15.0)
MCH: 28.6 pg (ref 26.0–34.0)
MCHC: 31.8 g/dL (ref 30.0–36.0)
MCV: 89.9 fL (ref 78.0–100.0)
PLATELETS: 227 10*3/uL (ref 150–400)
RBC: 3.98 MIL/uL (ref 3.87–5.11)
RDW: 11.8 % (ref 11.5–15.5)
WBC: 8.3 10*3/uL (ref 4.0–10.5)

## 2015-03-10 LAB — BASIC METABOLIC PANEL
Anion gap: 10 (ref 5–15)
BUN: 6 mg/dL (ref 6–20)
CALCIUM: 9.6 mg/dL (ref 8.9–10.3)
CHLORIDE: 106 mmol/L (ref 101–111)
CO2: 25 mmol/L (ref 22–32)
CREATININE: 0.67 mg/dL (ref 0.44–1.00)
GFR calc non Af Amer: >60 mL/min (ref >60)
Glucose, Bld: 86 mg/dL (ref 65–99)
Potassium: 3.7 mmol/L (ref 3.5–5.1)
SODIUM: 141 mmol/L (ref 135–145)

## 2015-03-10 LAB — CBG MONITORING, ED: GLUCOSE-CAPILLARY: 66 mg/dL (ref 65–99)

## 2015-03-10 MED ORDER — SODIUM CHLORIDE 0.9 % IV BOLUS (SEPSIS): 1000.0000 mL | Freq: Once | INTRAVENOUS | Status: DC

## 2015-03-10 NOTE — ED Notes (Signed)
Pt friend on arrival now reporting pt had full syncopal episode lasting 3 minutes and was pale.  Pt reporting she felt like she was going to pass out and felt her heart racing.

## 2015-03-10 NOTE — ED Notes (Signed)
Pt via [facility] with c/o near syncopal episode, assisted to the floor by friend.  Pt denies pain, felt nauseous initially and anxious, calm on arrival with no complaints.  Denies pain.  Hx WPW and POTS.  Pt in NAD, A&O.

## 2015-03-10 NOTE — Discharge Instructions (Signed)
Your work up today was unremarkable. Stay well hydrated, and follow up with your regular doctor as well as your cardiologist and your neurologist in 3-5 days for ongoing management of your recurrent passing out episodes. Avoid caffeine and tobacco. Stop smoking. Return to the ER for changes or worsening symptoms.   Dizziness Dizziness is a common problem. It makes you feel unsteady or lightheaded. You may feel like you are about to pass out (faint). Dizziness can lead to injury if you stumble or fall. Anyone can get dizzy, but dizziness is more common in older adults. This condition can be caused by a number of things, including:  Medicines.  Dehydration.  Illness. HOME CARE Following these instructions may help with your condition: Eating and Drinking  Drink enough fluid to keep your pee (urine) clear or pale yellow. This helps to keep you from getting dehydrated. Try to drink more clear fluids, such as water.  Do not drink alcohol.  Limit how much caffeine you drink or eat if told by your doctor.  Limit how much salt you drink or eat if told by your doctor. Activity  Avoid making quick movements.  When you stand up from sitting in a chair, steady yourself until you feel okay.  In the morning, first sit up on the side of the bed. When you feel okay, stand slowly while you hold onto something. Do this until you know that your balance is fine.  Move your legs often if you need to stand in one place for a long time. Tighten and relax your muscles in your legs while you are standing.  Do not drive or use heavy machinery if you feel dizzy.  Avoid bending down if you feel dizzy. Place items in your home so that they are easy for you to reach without leaning over. Lifestyle  Do not use any tobacco products, including cigarettes, chewing tobacco, or electronic cigarettes. If you need help quitting, ask your doctor.  Try to lower your stress level, such as with yoga or meditation. Talk  with your doctor if you need help. General Instructions  Watch your dizziness for any changes.  Take medicines only as told by your doctor. Talk with your doctor if you think that your dizziness is caused by a medicine that you are taking.  Tell a friend or a family member that you are feeling dizzy. If he or she notices any changes in your behavior, have this person call your doctor.  Keep all follow-up visits as told by your doctor. This is important. GET HELP IF:  Your dizziness does not go away.  Your dizziness or light-headedness gets worse.  You feel sick to your stomach (nauseous).  You have trouble hearing.  You have new symptoms.  You are unsteady on your feet or you feel like the room is spinning. GET HELP RIGHT AWAY IF:  You throw up (vomit) or have diarrhea and are unable to eat or drink anything.  You have trouble:  Talking.  Walking.  Swallowing.  Using your arms, hands, or legs.  You feel generally weak.  You are not thinking clearly or you have trouble forming sentences. It may take a friend or family member to notice this.  You have:  Chest pain.  Pain in your belly (abdomen).  Shortness of breath.  Sweating.  Your vision changes.  You are bleeding.  You have a headache.  You have neck pain or a stiff neck.  You have a fever.  This information is not intended to replace advice given to you by your health care provider. Make sure you discuss any questions you have with your health care provider.   Document Released: 03/10/2011 Document Revised: 08/05/2014 Document Reviewed: 03/17/2014 Elsevier Interactive Patient Education 2016 [employer].  Near-Syncope Near-syncope (commonly known as near fainting) is sudden weakness, dizziness, or feeling like you might pass out. During an episode of near-syncope, you may also develop pale skin, have tunnel vision, or feel sick to your stomach (nauseous). Near-syncope may occur when getting up  after sitting or while standing for a long time. It is caused by a sudden decrease in blood flow to the brain. This decrease can result from various causes or triggers, most of which are not serious. However, because near-syncope can sometimes be a sign of something serious, a medical evaluation is required. The specific cause is often not determined. HOME CARE INSTRUCTIONS  Monitor your condition for any changes. The following actions may help to alleviate any discomfort you are experiencing:  Have someone stay with you until you feel stable.  Lie down right away and prop your feet up if you start feeling like you might faint. Breathe deeply and steadily. Wait until all the symptoms have passed. Most of these episodes last only a few minutes. You may feel tired for several hours.   Drink enough fluids to keep your urine clear or pale yellow.   If you are taking blood pressure or heart medicine, get up slowly when seated or lying down. Take several minutes to sit and then stand. This can reduce dizziness.  Follow up with your health care provider as directed. SEEK IMMEDIATE MEDICAL CARE IF:   You have a severe headache.   You have unusual pain in the chest, abdomen, or back.   You are bleeding from the mouth or rectum, or you have black or tarry stool.   You have an irregular or very fast heartbeat.   You have repeated fainting or have seizure-like jerking during an episode.   You faint when sitting or lying down.   You have confusion.   You have difficulty walking.   You have severe weakness.   You have vision problems.  MAKE SURE YOU:   Understand these instructions.  Will watch your condition.  Will get help right away if you are not doing well or get worse.   This information is not intended to replace advice given to you by your health care provider. Make sure you discuss any questions you have with your health care provider.   Document Released:  03/21/2005 Document Revised: 03/26/2013 Document Reviewed: 08/24/2012 Elsevier Interactive Patient Education [employer]  Palpitations A palpitation is the feeling that your heartbeat is irregular or is faster than normal. It may feel like your heart is fluttering or skipping a beat. Palpitations are usually not a serious problem. However, in some cases, you may need further medical evaluation. CAUSES  Palpitations can be caused by:  Smoking.  Caffeine or other stimulants, such as diet pills or energy drinks.  Alcohol.  Stress and anxiety.  Strenuous physical activity.  Fatigue.  Certain medicines.  Heart disease, especially if you have a history of irregular heart rhythms (arrhythmias), such as atrial fibrillation, atrial flutter, or supraventricular tachycardia.  An improperly working pacemaker or defibrillator. DIAGNOSIS  To find the cause of your palpitations, your health care provider will take your medical history and perform a physical exam. Your health care provider may  also have you take a test called an ambulatory electrocardiogram (ECG). An ECG records your heartbeat patterns over a 24-hour period. You may also have other tests, such as:  Transthoracic echocardiogram (TTE). During echocardiography, sound waves are used to evaluate how blood flows through your heart.  Transesophageal echocardiogram (TEE).  Cardiac monitoring. This allows your health care provider to monitor your heart rate and rhythm in real time.  Holter monitor. This is a portable device that records your heartbeat and can help diagnose heart arrhythmias. It allows your health care provider to track your heart activity for several days, if needed.  Stress tests by exercise or by giving medicine that makes the heart beat faster. TREATMENT  Treatment of palpitations depends on the cause of your symptoms and can vary greatly. Most cases of palpitations do not require any treatment other than  time, relaxation, and monitoring your symptoms. Other causes, such as atrial fibrillation, atrial flutter, or supraventricular tachycardia, usually require further treatment. HOME CARE INSTRUCTIONS   Avoid:  Caffeinated coffee, tea, soft drinks, diet pills, and energy drinks.  Chocolate.  Alcohol.  Stop smoking if you smoke.  Reduce your stress and anxiety. Things that can help you relax include:  A method of controlling things in your body, such as your heartbeats, with your mind (biofeedback).  Yoga.  Meditation.  Physical activity such as swimming, jogging, or walking.  Get plenty of rest and sleep. SEEK MEDICAL CARE IF:   You continue to have a fast or irregular heartbeat beyond 24 hours.  Your palpitations occur more often. SEEK IMMEDIATE MEDICAL CARE IF:  You have chest pain or shortness of breath.  You have a severe headache.  You feel dizzy or you faint. MAKE SURE YOU:  Understand these instructions.  Will watch your condition.  Will get help right away if you are not doing well or get worse.   This information is not intended to replace advice given to you by your health care provider. Make sure you discuss any questions you have with your health care provider.   Document Released: 03/18/2000 Document Revised: 03/26/2013 Document Reviewed: 05/20/2011 Elsevier Interactive Patient Education 2016 [employer].  Smoking Cessation, Tips for Success If you are ready to quit smoking, congratulations! You have chosen to help yourself be healthier. Cigarettes bring nicotine, tar, carbon monoxide, and other irritants into your body. Your lungs, heart, and blood vessels will be able to work better without these poisons. There are many different ways to quit smoking. Nicotine gum, nicotine patches, a nicotine inhaler, or nicotine nasal spray can help with physical craving. Hypnosis, support groups, and medicines help break the habit of smoking. WHAT THINGS CAN I DO TO  MAKE QUITTING EASIER?  Here are some tips to help you quit for good:  Pick a date when you will quit smoking completely. Tell all of your friends and family about your plan to quit on that date.  Do not try to slowly cut down on the number of cigarettes you are smoking. Pick a quit date and quit smoking completely starting on that day.  Throw away all cigarettes.   Clean and remove all ashtrays from your home, work, and car.  On a card, write down your reasons for quitting. Carry the card with you and read it when you get the urge to smoke.  Cleanse your body of nicotine. Drink enough water and fluids to keep your urine clear or pale yellow. Do this after quitting to flush the nicotine  from your body.  Learn to predict your moods. Do not let a bad situation be your excuse to have a cigarette. Some situations in your life might tempt you into wanting a cigarette.  Never have "just one" cigarette. It leads to wanting another and another. Remind yourself of your decision to quit.  Change habits associated with smoking. If you smoked while driving or when feeling stressed, try other activities to replace smoking. Stand up when drinking your coffee. Brush your teeth after eating. Sit in a different chair when you read the paper. Avoid alcohol while trying to quit, and try to drink fewer caffeinated beverages. Alcohol and caffeine may urge you to smoke.  Avoid foods and drinks that can trigger a desire to smoke, such as sugary or spicy foods and alcohol.  Ask people who smoke not to smoke around you.  Have something planned to do right after eating or having a cup of coffee. For example, plan to take a walk or exercise.  Try a relaxation exercise to calm you down and decrease your stress. Remember, you may be tense and nervous for the first 2 weeks after you quit, but this will pass.  Find new activities to keep your hands busy. Play with a pen, coin, or rubber band. Doodle or draw things on  paper.  Brush your teeth right after eating. This will help cut down on the craving for the taste of tobacco after meals. You can also try mouthwash.   Use oral substitutes in place of cigarettes. Try using lemon drops, carrots, cinnamon sticks, or chewing gum. Keep them handy so they are available when you have the urge to smoke.  When you have the urge to smoke, try deep breathing.  Designate your home as a nonsmoking area.  If you are a heavy smoker, ask your health care provider about a prescription for nicotine chewing gum. It can ease your withdrawal from nicotine.  Reward yourself. Set aside the cigarette money you save and buy yourself something nice.  Look for support from others. Join a support group or smoking cessation program. Ask someone at home or at work to help you with your plan to quit smoking.  Always ask yourself, "Do I need this cigarette or is this just a reflex?" Tell yourself, "Today, I choose not to smoke," or "I do not want to smoke." You are reminding yourself of your decision to quit.  Do not replace cigarette smoking with electronic cigarettes (commonly called e-cigarettes). The safety of e-cigarettes is unknown, and some may contain harmful chemicals.  If you relapse, do not give up! Plan ahead and think about what you will do the next time you get the urge to smoke. HOW WILL I FEEL WHEN I QUIT SMOKING? You may have symptoms of withdrawal because your body is used to nicotine (the addictive substance in cigarettes). You may crave cigarettes, be irritable, feel very hungry, cough often, get headaches, or have difficulty concentrating. The withdrawal symptoms are only temporary. They are strongest when you first quit but will go away within 10-14 days. When withdrawal symptoms occur, stay in control. Think about your reasons for quitting. Remind yourself that these are signs that your body is healing and getting used to being without cigarettes. Remember that  withdrawal symptoms are easier to treat than the major diseases that smoking can cause.  Even after the withdrawal is over, expect periodic urges to smoke. However, these cravings are generally short lived and will go away  whether you smoke or not. Do not smoke! WHAT RESOURCES ARE AVAILABLE TO HELP ME QUIT SMOKING? Your health care provider can direct you to community resources or hospitals for support, which may include:  Group support.  Education.  Hypnosis.  Therapy.   This information is not intended to replace advice given to you by your health care provider. Make sure you discuss any questions you have with your health care provider.   Document Released: 12/18/2003 Document Revised: 04/11/2014 Document Reviewed: 09/06/2012 Elsevier Interactive Patient Education 2016 [employer].  Syncope, commonly known as fainting, is a temporary loss of consciousness. It occurs when the blood flow to the brain is reduced. Vasovagal syncope (also called neurocardiogenic syncope) is a fainting spell in which the blood flow to the brain is reduced because of a sudden drop in heart rate and blood pressure. Vasovagal syncope occurs when the brain and the cardiovascular system (blood vessels) do not adequately communicate and respond to each other. This is the most common cause of fainting. It often occurs in response to fear or some other type of emotional or physical stress. The body has a reaction in which the heart starts beating too slowly or the blood vessels expand, reducing blood pressure. This type of fainting spell is generally considered harmless. However, injuries can occur if a person takes a sudden fall during a fainting spell.  CAUSES  Vasovagal syncope occurs when a person's blood pressure and heart rate decrease suddenly, usually in response to a trigger. Many things and situations can trigger an episode. Some of these include:   Pain.   Fear.   The sight of blood or medical  procedures, such as blood being drawn from a vein.   Common activities, such as coughing, swallowing, stretching, or going to the bathroom.   Emotional stress.   Prolonged standing, especially in a warm environment.   Lack of sleep or rest.   Prolonged lack of food.   Prolonged lack of fluids.   Recent illness.  The use of certain drugs that affect blood pressure, such as cocaine, alcohol, marijuana, inhalants, and opiates.  SYMPTOMS  Before the fainting episode, you may:   Feel dizzy or light headed.   Become pale.  Sense that you are going to faint.   Feel like the room is spinning.   Have tunnel vision, only seeing directly in front of you.   Feel sick to your stomach (nauseous).   See spots or slowly lose vision.   Hear ringing in your ears.   Have a headache.   Feel warm and sweaty.   Feel a sensation of pins and needles. During the fainting spell, you will generally be unconscious for no longer than a couple minutes before waking up and returning to normal. If you get up too quickly before your body can recover, you may faint again. Some twitching or jerky movements may occur during the fainting spell.  DIAGNOSIS  Your health care provider will ask about your symptoms, take a medical history, and perform a physical exam. Various tests may be done to rule out other causes of fainting. These may include blood tests and tests to check the heart, such as electrocardiography, echocardiography, and possibly an electrophysiology study. When other causes have been ruled out, a test may be done to check the body's response to changes in position (tilt table test). TREATMENT  Most cases of vasovagal syncope do not require treatment. Your health care provider may recommend ways to avoid fainting  triggers and may provide home strategies for preventing fainting. If you must be exposed to a possible trigger, you can drink additional fluids to help reduce your  chances of having an episode of vasovagal syncope. If you have warning signs of an oncoming episode, you can respond by positioning yourself favorably (lying down). If your fainting spells continue, you may be given medicines to prevent fainting. Some medicines may help make you more resistant to repeated episodes of vasovagal syncope. Special exercises or compression stockings may be recommended. In rare cases, the surgical placement of a pacemaker is considered. HOME CARE INSTRUCTIONS   Learn to identify the warning signs of vasovagal syncope.   Sit or lie down at the first warning sign of a fainting spell. If sitting, put your head down between your legs. If you lie down, swing your legs up in the air to increase blood flow to the brain.   Avoid hot tubs and saunas.  Avoid prolonged standing.  Drink enough fluids to keep your urine clear or pale yellow. Avoid caffeine.  Increase salt in your diet as directed by your health care provider.   If you have to stand for a long time, perform movements such as:   Crossing your legs.   Flexing and stretching your leg muscles.   Squatting.   Moving your legs.   Bending over.   Only take over-the-counter or prescription medicines as directed by your health care provider. Do not suddenly stop any medicines without asking your health care provider first. SEEK MEDICAL CARE IF:   Your fainting spells continue or happen more frequently in spite of treatment.   You lose consciousness for more than a couple minutes.  You have fainting spells during or after exercising or after being startled.   You have new symptoms that occur with the fainting spells, such as:   Shortness of breath.  Chest pain.   Irregular heartbeat.   You have episodes of twitching or jerky movements that last longer than a few seconds.  You have episodes of twitching or jerky movements without obvious fainting. SEEK IMMEDIATE MEDICAL CARE IF:    You have injuries or bleeding after a fainting spell.   You have episodes of twitching or jerky movements that last longer than 5 minutes.   You have more than one spell of twitching or jerky movements before returning to consciousness after fainting.   This information is not intended to replace advice given to you by your health care provider. Make sure you discuss any questions you have with your health care provider.   Document Released: 03/07/2012 Document Revised: 08/05/2014 Document Reviewed: 03/07/2012 Elsevier Interactive Patient Education [employer]

## 2015-03-10 NOTE — ED Notes (Signed)
Pt left at this time with all belongings.  

## 2015-03-10 NOTE — ED Provider Notes (Signed)
Here with recurrentCSN: [id number]     Arrival date & time 03/10/15  1711 History   First MD Initiated Contact with Patient 03/10/15 1722     Chief Complaint  Patient presents with  . Near Syncope     (Consider location/radiation/quality/duration/timing/severity/associated sxs/prior Treatment) HPI Comments: Desiree Gutierrez is a 24 y.o. female with a PMHx of WPW s/p ablation in 2011, recurrent vasovagal syncope with +tilt table testing, POTS, palpitations s/p MDT ILR implantation, and chronic headaches, who presents to the ED with complaints of recurrent syncopal episode. Patient states that she was eating lunch around 4:30 and developed palpitations and felt short of breath, and felt lightheaded and had a syncopal episode which was witnessed by her friend who helped her down to the floor, and stated that she was unconscious for approximately 2-3 minutes. Patient states this is similar to her prior syncopal episodes. At this time she denies any ongoing palpitations or shortness of breath. She states she only feels short of breath when she has the palpitations.  She denies any diaphoresis, fevers, chills, chest pain, leg swelling, recent travel/surgery/immobilization, OCP use, history of DVT/PE, abdominal pain, nausea, vomiting, diarrhea, constipation, melena, hematochezia, dysuria, hematuria, numbness, tingling, weakness, headache, vision changes, or tremors. Her friend had any body shaking/seizure like activity. She is a nonsmoker.  Chart review reveals 22 ED visits for similar symptoms, all with negative work ups, seen by Cardiology and had loop recorder and holter monitor which were unremarkable, neurology saw her 1 month ago and she had a neg EEG and MRI.   Patient is a 24 y.o. female presenting with near-syncope. The history is provided by the patient and medical records. No language interpreter was used.  Near Syncope This is a recurrent problem. The current episode started today. The problem  occurs intermittently. The problem has been resolved. Pertinent negatives include no abdominal pain, arthralgias, chest pain, chills, coughing, diaphoresis, fever, headaches, myalgias, nausea, numbness, vertigo, visual change, vomiting or weakness. Nothing aggravates the symptoms. She has tried nothing for the symptoms. The treatment provided no relief.    Past Medical History  Diagnosis Date  . WPW (Wolff-Parkinson-White syndrome)     a. s/p ablation (2011) Dr Gevena Cotton at [facility] (posterior lateral pathway)  . Vasovagal syncope     a. positive tilt 2011 b. failed medical therapy with Florinef, Midodrine, Inderal, Celexa  . POTS (postural orthostatic tachycardia syndrome)   . Palpitations     a. s/p MDT ILR implanted by Dr Alfonso Ellis [facility])  . Headache   . Syncope     recurrent, daily   Past Surgical History  Procedure Laterality Date  . Cardiac electrophysiology study and ablation  2011    Dr. Gevena Cotton at [facility]- left posterior pathway ablation for WPW  . Tilt table study  2011    neurally mediated syncope  . Loop recorder implant  01/22/2014    MDT LINQ implanted by Dr Alfonso Ellis at [facility] for evaluation of palpitations/syncope  . Cardiac surgery     Family History  Problem Relation Age of Onset  . Diabetes Mother   . Hypertension Mother   . Stroke Mother   . Hypertension Brother   . Seizures Neg Hx    Social History  Substance Use Topics  . Smoking status: Never Smoker   . Smokeless tobacco: None  . Alcohol Use: No   OB History    No data available     Review of Systems  Constitutional: Negative for fever, chills and diaphoresis.  Eyes: Negative for visual disturbance.  Respiratory: Negative for cough and shortness of breath (only with palpitations, no longer ongoing).   Cardiovascular: Positive for palpitations (now resolved) and near-syncope. Negative for chest pain and leg swelling.  Gastrointestinal: Negative for nausea, vomiting, abdominal pain, diarrhea, constipation  and blood in stool.  Genitourinary: Negative for dysuria and hematuria.  Musculoskeletal: Negative for myalgias and arthralgias.  Skin: Negative for color change.  Allergic/Immunologic: Negative for immunocompromised state.  Neurological: Positive for syncope and light-headedness. Negative for vertigo, tremors, weakness, numbness and headaches.  Psychiatric/Behavioral: Negative for confusion.   10 Systems reviewed and are negative for acute change except as noted in the HPI.    Allergies  Amitiza; Other; Ketorolac; Metoclopramide; Midodrine; and Fludrocortisone  Home Medications   Prior to Admission medications   Medication Sig Start Date End Date Taking? Authorizing Provider  Calcium-Magnesium-Vitamin D (CALCIUM MAGNESIUM PO) Take 2 tablets by mouth daily.    Historical Provider, MD  Cholecalciferol (D-5000) 5000 UNITS TABS Take 5,000 Units by mouth daily.    Historical Provider, MD  Coenzyme Q10 (COQ10 PO) Take 1 tablet by mouth daily.    Historical Provider, MD  Cyanocobalamin (RA VITAMIN B-12 TR) 1000 MCG TBCR Take 1,000 mcg by mouth daily.    Historical Provider, MD  IRON PO Take 325 mg by mouth daily.    Historical Provider, MD  Omega-3 1000 MG CAPS Take 1 capsule by mouth daily.    Historical Provider, MD  omega-3 acid ethyl esters (LOVAZA) 1 G capsule Take 1 g by mouth daily.    Historical Provider, MD  polyethylene glycol powder (GLYCOLAX/MIRALAX) powder Take 17 g by mouth 2 (two) times daily as needed. 02/13/15   [name], MD  Probiotic Product (PROBIOTIC PO) Take 1 tablet by mouth daily.    Historical Provider, MD  sodium chloride 1 G tablet Take 1 g by mouth 2 (two) times daily.    Historical Provider, MD   BP 122/77 mmHg  Pulse 104  Temp(Src) 97.9 F (36.6 C) (Oral)  Resp 22  SpO2 100%  LMP 02/23/2015 Physical Exam  Constitutional: She is oriented to person, place, and time. Vital signs are normal. She appears well-developed and well-nourished.  Non-toxic  appearance. No distress.  Afebrile, nontoxic, NAD   HENT:  Head: Normocephalic and atraumatic.  Mouth/Throat: Oropharynx is clear and moist. Mucous membranes are dry.  Mildly dry mucous membranes  Eyes: Conjunctivae and EOM are normal. Pupils are equal, round, and reactive to light. Right eye exhibits no discharge. Left eye exhibits no discharge.  PERRL, EOMI, no nystagmus, no visual field deficits   Neck: Normal range of motion. Neck supple.  Cardiovascular: Normal rate, regular rhythm, normal heart sounds and intact distal pulses.  Exam reveals no gallop and no friction rub.   No murmur heard. Tachycardic in triage but during my exam, HR 95-98, RRR, nl s1/s2, no m/r/g, distal pulses intact, no pedal edema   Pulmonary/Chest: Effort normal and breath sounds normal. No respiratory distress. She has no decreased breath sounds. She has no wheezes. She has no rhonchi. She has no rales.  CTAB in all lung fields, no w/r/r, no hypoxia or increased WOB, speaking in full sentences, SpO2 100% on RA   Abdominal: Soft. Normal appearance and bowel sounds are normal. She exhibits no distension. There is no tenderness. There is no rigidity, no rebound and no guarding.  Musculoskeletal: Normal range of motion.  MAE x4 Strength and sensation grossly intact Distal pulses intact  No pedal edema, neg homan's bilaterally  Gait steady  Neurological: She is alert and oriented to person, place, and time. She has normal strength. No cranial nerve deficit or sensory deficit. Coordination and gait normal. GCS eye subscore is 4. GCS verbal subscore is 5. GCS motor subscore is 6.  CN 2-12 grossly intact A&O x4 GCS 15 Sensation and strength intact Gait nonataxic Coordination with finger-to-nose WNL Neg pronator drift   Skin: Skin is warm, dry and intact. No rash noted.  Psychiatric: She has a normal mood and affect.  Nursing note and vitals reviewed.   ED Course  Procedures (including critical care  time)  18:09:29 Orthostatic Vital Signs AT  Orthostatic Lying  - BP- Lying: 101/65 mmHg ; Pulse- Lying: 84  Orthostatic Sitting - BP- Sitting: 122/84 mmHg (Denies dizziness, lightheadedness or SOB.) ; Pulse- Sitting: 83  Orthostatic Standing at 0 minutes - BP- Standing at 0 minutes: 117/88 mmHg (Denies dizziness, SOB, palpatations.) ; Pulse- Standing at 0 minutes: 87      Labs Review Labs Reviewed  CBC - Abnormal; Notable for the following:    Hemoglobin 11.4 (*)    HCT 35.8 (*)    All other components within normal limits  BASIC METABOLIC PANEL  URINALYSIS, ROUTINE W REFLEX MICROSCOPIC (NOT [facility])  CBG MONITORING, ED  I-STAT BETA HCG BLOOD, ED ([facility], [facility], [facility] ONLY)    Imaging Review No results found.   Echo 06/26/14:  Study Conclusions - Left ventricle: The cavity size was normal. Systolic function was normal. The estimated ejection fraction was in the range of 60% to 65%. Wall motion was normal; there were no regional wall motion abnormalities.  EEG 02/2015: negative  Study Result MRI brain 02/13/15     [facility] [address]ire[address] Gutierrez, [address] [place], [place] [address] [phone]  NEUROIMAGING REPORT   STUDY DATE: 02/13/2015 PATIENT NAME: Desiree Gutierrez DOB: 11/30/1990 MRN: [id number]  EXAM: MRI Brain without contrast  ORDERING CLINICIAN: Naomie DeanAntonia Ahern M.D. CLINICAL HISTORY: 24 year old man with episodes of altered consciousness COMPARISON FILMS: None  TECHNIQUE: MRI of the brain without contrast was obtained utilizing 5 mm axial slices with T1, T2, T2 flair, SWI and diffusion weighted views. T1 sagittal and T2 coronal views were obtained. Also, thin coronal T2 sections through the temporal lobes were performed. CONTRAST: none (study ordered with and without the access but IV access was not obtained) IMAGING SITE: [facility], [address], [place]  FINDINGS: On sagittal images, the spinal cord is imaged caudally to C4  and is normal in caliber. The contents of the posterior fossa are of normal size and position. The pituitary gland and optic chiasm appear normal. Brain volume appears normal. The ventricles are normal in size and without distortion. There are no abnormal extra-axial collections of fluid.   The cerebellum and brainstem appears normal. The deep gray matter appears normal. The cerebral hemispheres appear normal. The orbits appear normal. The VIIth/VIIIth nerve complex appears normal. The mastoid air cells appear normal. The paranasal sinuses appear normal. Flow voids are identified within the major intracerebral arteries.   Diffusion weighted images are normal. Susceptibility weighted images are normal.    IMPRESSION: This is a normal noncontrasted MRI of the brain.     INTERPRETING PHYSICIAN:  Richard A. Epimenio FootSater, MD, PhD Certified in Neuroimaging by American Society of Neuroimaging    I have personally reviewed and evaluated these images and lab results as part of my medical decision-making.   EKG Interpretation  Date/Time:  Tuesday March 10 2015 17:31:06 EST Ventricular Rate:  103 PR Interval:  121 QRS Duration: 81 QT Interval:  339 QTC Calculation: 444 R Axis:   66 Text Interpretation:  Sinus tachycardia Borderline repolarization  abnormality no wpw, prolonged qt, brugada or arthymogenic RV Confirmed by  FLOYD MD, Reuel Boom ([id number]) on 03/10/2015 6:25:10 PM      MDM   Final diagnoses:  Syncope, unspecified syncope type  Intermittent palpitations  Intermittent lightheadedness  Tobacco use    24 y.o. female here with recurrent syncopal episode. She reports she was eating when she developed palpitations and felt short of breath felt like she was going to pass out. Friend helped her to the floor and states that she was "unconscious" for approximately 2-3 minutes. She is currently denying any ongoing symptoms. She has had a loop recorder and Holter  monitor in the past which showed no findings, she has had an MRI and EEG last month which were negative. This episode today was the same as her prior episodes. She arrives with no tachycardia, no hypoxia, pertinent negatives therefore doubt PE. No headache or neurological deficits, doubt need for imaging at this time. Will check basic labs, urinalysis, pregnancy test, and EKG. Will check orthostatics and give fluids. Will reassess shortly.  7:44 PM Unable to obtain adequate IV access, therefore PO fluids were given instead. HR improving, orthostatics neg. Labs thus far reveal neg BetaHCG, BMP WNL, CBC with baseline anemia but no acute changes. Awaiting U/A. EKG unremarkable. Will await U/A.  8:04 PM U/A unremarkable. Doubt emergent etiology of syncope, especially given that she has recently seen neurology and cardiology and had negative testing extensively as outpt. Could be dehydration related or due to her POTS. Pt tolerating PO well, discussed good hydration, avoidance of caffeine and tobacco. Smoking cessation extensively discussed. F/up with PCP, cardiology, and neurology in 3-5 days for ongoing management. Pt feels well, ready to go home, and stated that she has cardiology f/up in 2 days. I explained the diagnosis and have given explicit precautions to return to the ER including for any other new or worsening symptoms. The patient understands and accepts the medical plan as it's been dictated and I have answered their questions. Discharge instructions concerning home care and prescriptions have been given. The patient is STABLE and is discharged to home in good condition.    BP 109/78 mmHg  Pulse 87  Temp(Src) 97.9 F (36.6 C) (Oral)  Resp 16  SpO2 100%  LMP 02/23/2015  Meds ordered this encounter  Medications  . DISCONTD: sodium chloride 0.9 % bolus 1,000 mL    Sig:      Desiree Colclasure Camprubi-Soms, PA-C 03/10/15 2009  Melene Plan, DO 03/10/15 2318

## 2015-03-12 ENCOUNTER — Telehealth: Payer: Self-pay

## 2015-03-12 NOTE — Telephone Encounter (Signed)
Nurse called patient, patient verified date of birth. Per patient, ED requested patient to see cardiology. Patient reports her tongue and face being swollen and having swallowing difficulty yesterday. Patient reports "my tongue has went down a whole lot from yesterday", and the patient is not having as much difficulty swallowing currently. Patient reports having a little problem swallowing.  Patient reports "I've been having difficulty breathing for the last month or so, not sure what's causing it though".  Patient reported having difficulty breathing for the last month at ED yesterday. Patient is on her way to see cardiologist.  Nurse spoke to Dr. Armen PickupFunches, Per Dr. Armen PickupFunches schedule patient in next available appointment. Dr. Armen PickupFunches aware this appointment is in January.  Patient agrees to call [phone] if throat and tongue swell or if patient has difficulty breathing.  Patient disconnected call when placed on hold. Front office staff to call patient and schedule appointment for next available appointment with Dr. Armen PickupFunches.

## 2015-03-12 NOTE — Telephone Encounter (Signed)
Desiree Gutierrez with [facility] called nurse and left voicemail. Voicemail explained patient has been to ED for syncope and tachycardia. [name] wanted to inform PCP of ED visit and to see if PCP wants to see patient for ED follow up.  [name] explained on voicemail, patinet has already spoken with cardiology concerning ED visit.  Nurse tried to return call to Desiree Gutierrez. Nurse does not have [name]'s extension and can not get past automated recording for [facility]. Nurse called patient, reached voicemail. Left message for patient to call Des[facility] with [facility], at [phone]. Nurse called patient to make ED follow up appointment.

## 2015-03-17 ENCOUNTER — Encounter: Payer: Self-pay | Admitting: Family Medicine

## 2015-03-17 ENCOUNTER — Ambulatory Visit: Payer: Medicaid Other | Attending: Family Medicine | Admitting: Family Medicine

## 2015-03-17 VITALS — BP 103/64 | HR 67 | Temp 98.5°F | Resp 16 | Ht 63.0 in | Wt 138.0 lb

## 2015-03-17 DIAGNOSIS — K0889 Other specified disorders of teeth and supporting structures: Secondary | ICD-10-CM

## 2015-03-17 DIAGNOSIS — K5901 Slow transit constipation: Secondary | ICD-10-CM | POA: Diagnosis not present

## 2015-03-17 DIAGNOSIS — E162 Hypoglycemia, unspecified: Secondary | ICD-10-CM | POA: Diagnosis not present

## 2015-03-17 DIAGNOSIS — R55 Syncope and collapse: Secondary | ICD-10-CM

## 2015-03-17 DIAGNOSIS — R0602 Shortness of breath: Secondary | ICD-10-CM | POA: Diagnosis not present

## 2015-03-17 DIAGNOSIS — Z79899 Other long term (current) drug therapy: Secondary | ICD-10-CM | POA: Insufficient documentation

## 2015-03-17 LAB — GLUCOSE, POCT (MANUAL RESULT ENTRY): POC GLUCOSE: 102 mg/dL — AB (ref 70–99)

## 2015-03-17 MED ORDER — DOCUSATE SODIUM 100 MG PO CAPS: 100.0000 mg | ORAL_CAPSULE | Freq: Two times a day (BID) | ORAL | Status: DC

## 2015-03-17 NOTE — Patient Instructions (Addendum)
Desiree Gutierrez was seen today for hospitalization follow-up.  Diagnoses and all orders for this visit:  Hypoglycemia -     POCT glucose (manual entry)  Syncope and collapse  Slow transit constipation -     docusate sodium (COLACE) 100 MG capsule; Take 1 capsule (100 mg total) by mouth 2 (two) times daily.  Pain of molar -     Ambulatory referral to Dentistry   F/u in one week for pharmacy BP/HR check  F/u with me in 4 weeks for recurrent syncope   Dr. Armen PickupFunches

## 2015-03-17 NOTE — Assessment & Plan Note (Signed)
New dental referral as the office provided is not accessible by phone

## 2015-03-17 NOTE — Progress Notes (Signed)
Patient ID: Desiree Gutierrez, female   DOB: 1990-09-06, 24 y.o.   MRN: [id number]   Subjective:  Patient ID: Desiree Gutierrez, female    DOB: 26-Nov-1990  Age: 24 y.o. MRN: [id number]  CC: Hospitalization Follow-up   HPI Desiree Gutierrez presents for recurrent syncope. She was hospitalized at [facility] from 12/8-12/11/16. Then back to ED on 03/16/15 for syncope witnessed at the home of her close friend and  school advisor, Dr. Margo Aye. W/u revealed only mild pulmonic valve and tricuspid valve regurg on TEE. She has been prescribed 10 mg of nadolol to take nightly. She has not started taking it yet.  Her iron has been increased to TID.  She is awaiting results from the event monitor. She as called by the company and informed that there was arrhythmia captured. She missed her repeat tilt table test due to fatigue and illness. She reports CP, fatigue and SOB all the time that is worse after a syncopal episode.  Today, I spoke to Dr. Margo Aye with [name] in the office who relayed the episode of syncope followed by tremor that occurred in his home yesterday. Of note, Desiree Gutierrez was doing well prior to the event. There was no warning or aura. She has just finished dinner gone to the restroom and was walking back to the dining area when she fainted, fell to the ground face down and began to tremor.   Teeth pain: she is attempting to schedule dental appt. She is not getting an answer at the # provided.   Outpatient Prescriptions Prior to Visit  Medication Sig Dispense Refill  . Calcium-Magnesium-Vitamin D (CALCIUM MAGNESIUM PO) Take 2 tablets by mouth daily.    . Cholecalciferol (D-5000) 5000 UNITS TABS Take 5,000 Units by mouth daily.    . Coenzyme Q10 (COQ10 PO) Take 1 tablet by mouth daily.    . Cyanocobalamin (RA VITAMIN B-12 TR) 1000 MCG TBCR Take 1,000 mcg by mouth daily.    . IRON PO Take 325 mg by mouth daily.    . Omega-3 1000 MG CAPS Take 1 capsule by mouth daily.    Marland Kitchen omega-3 acid ethyl esters (LOVAZA) 1 G  capsule Take 1 g by mouth daily.    . polyethylene glycol powder (GLYCOLAX/MIRALAX) powder Take 17 g by mouth 2 (two) times daily as needed. 510 g 1  . Probiotic Product (PROBIOTIC PO) Take 1 tablet by mouth daily.    . sodium chloride 1 G tablet Take 1 g by mouth 2 (two) times daily.     No facility-administered medications prior to visit.    ROS Review of Systems  Constitutional: Positive for fatigue. Negative for fever and chills.  HENT: Positive for dental problem.   Eyes: Negative for visual disturbance.  Respiratory: Positive for shortness of breath.   Cardiovascular: Positive for chest pain and palpitations.  Gastrointestinal: Negative for abdominal pain and blood in stool.  Musculoskeletal: Negative for back pain and arthralgias.  Skin: Negative for rash.  Allergic/Immunologic: Negative for immunocompromised state.  Neurological: Positive for syncope.  Hematological: Negative for adenopathy. Does not bruise/bleed easily.  Psychiatric/Behavioral: Negative for suicidal ideas and dysphoric mood.    Objective:  BP 103/64 mmHg  Pulse 67  Temp(Src) 98.5 F (36.9 C) (Oral)  Resp 16  Ht  (1.6 m)  Wt 138 lb (62.596 kg)  BMI 24.45 kg/m2  SpO2 100%  LMP 02/23/2015  BP/Weight 03/17/2015 03/10/2015 02/26/2015  Systolic BP 103 109 122  Diastolic BP 64 78 84  Wt. (Lbs)  138 - -  BMI 24.45 - -   Physical Exam  Constitutional: She is oriented to person, place, and time. She appears well-developed and well-nourished. No distress.  HENT:  Head: Normocephalic and atraumatic.  Cardiovascular: Normal rate, regular rhythm, normal heart sounds and intact distal pulses.   Pulmonary/Chest: Effort normal and breath sounds normal.  Musculoskeletal: She exhibits no edema.  Neurological: She is alert and oriented to person, place, and time.  Skin: Skin is warm and dry. No rash noted.  Psychiatric: She has a normal mood and affect.    Assessment & Plan:   Problem List Items  Addressed This Visit    Constipation (Chronic)   Relevant Medications   docusate sodium (COLACE) 100 MG capsule   Hypoglycemia - Primary (Chronic)   Relevant Orders   POCT glucose (manual entry) (Completed)   Pain of molar (Chronic)   Relevant Orders   Ambulatory referral to Dentistry    Other Visit Diagnoses    Syncope and collapse        Relevant Medications    nadolol (CORGARD) 20 MG tablet       No orders of the defined types were placed in this encounter.    Follow-up: No Follow-up on file.   Dessa PhiJosalyn Marja Adderley MD

## 2015-03-17 NOTE — Assessment & Plan Note (Addendum)
Recurrent syncope Followed by cardiology and EP. W/u for seizures negative with negative MRI and EEG  Patient to continue to f/u with EP and cards Trial of nadolol

## 2015-03-17 NOTE — Assessment & Plan Note (Signed)
Secondary to iron Colace ordered

## 2015-03-17 NOTE — Progress Notes (Signed)
HFU chest pain leg pain Stated feeling low energy  Pain scale 5  No suicidal thought in the past two weeks

## 2015-03-21 ENCOUNTER — Encounter (HOSPITAL_COMMUNITY): Payer: Self-pay

## 2015-03-21 ENCOUNTER — Emergency Department (HOSPITAL_COMMUNITY)
Admission: EM | Admit: 2015-03-21 | Discharge: 2015-03-21 | Disposition: A | Discharge status: Home / Self Care | Payer: Medicaid Other | Attending: Emergency Medicine | Admitting: Emergency Medicine

## 2015-03-21 DIAGNOSIS — R55 Syncope and collapse: Secondary | ICD-10-CM | POA: Diagnosis present

## 2015-03-21 DIAGNOSIS — Z3202 Encounter for pregnancy test, result negative: Secondary | ICD-10-CM | POA: Diagnosis not present

## 2015-03-21 DIAGNOSIS — Z79899 Other long term (current) drug therapy: Secondary | ICD-10-CM | POA: Insufficient documentation

## 2015-03-21 DIAGNOSIS — R0602 Shortness of breath: Secondary | ICD-10-CM | POA: Insufficient documentation

## 2015-03-21 DIAGNOSIS — Z8679 Personal history of other diseases of the circulatory system: Secondary | ICD-10-CM | POA: Insufficient documentation

## 2015-03-21 LAB — POC URINE PREG, ED: PREG TEST UR: NEGATIVE

## 2015-03-21 NOTE — Discharge Instructions (Signed)
Please read and follow all provided instructions.  Your diagnoses today include:  1. Syncope, unspecified syncope type    Tests performed today include:  Urine test  EKG - no problems  Vital signs. See below for your results today.   Medications prescribed:   None  Take any prescribed medications only as directed.  Home care instructions:  Follow any educational materials contained in this packet.  BE VERY CAREFUL not to take multiple medicines containing Tylenol (also called acetaminophen). Doing so can lead to an overdose which can damage your liver and cause liver failure and possibly death.   Follow-up instructions: Please follow-up with your primary care provider in the next 3 days for further evaluation of your symptoms.   Return instructions:   Please return to the Emergency Department if you experience worsening symptoms.   Return with worsening chest pain or shortness of breath  Please return if you have any other emergent concerns.  Additional Information:  Your vital signs today were: BP 107/65 mmHg   Pulse 87   Temp(Src) 98.6 F (37 C) (Oral)   Resp 22   SpO2 100%   LMP 02/23/2015 If your blood pressure (BP) was elevated above 135/85 this visit, please have this repeated by your doctor within one month. --------------

## 2015-03-21 NOTE — ED Notes (Signed)
Pt. Ambulated independently in hallway with no complaints of dizziness/lightheadedness.

## 2015-03-21 NOTE — ED Provider Notes (Signed)
CSN: [id number]     Arrival date & time 03/21/15  1024 History   First MD Initiated Contact with Patient 03/21/15 1044     Chief Complaint  Patient presents with  . Loss of Consciousness     (Consider location/radiation/quality/duration/timing/severity/associated sxs/prior Treatment) HPI Comments: Patient with intermittent syncopal episodes for years, extensive workup here and at [facility] -- presents with c/o syncope and shortness of breath. Episode occurred prior to arrival. She was standing for a short time when episode occurred. Patient with positive prodrome. She states that she was by herself woke up on the floor. She called her friend that just prior to passing out and her friend found her. She has had the exact same symptoms multiple times in the past. No chest pain. Patient denies risk factors for pulmonary embolism including: unilateral leg swelling, history of DVT/PE/other blood clots, use of estrogens, recent immobilizations, recent surgery, recent travel (>4hr segment), malignancy, hemoptysis. She has been on multiple medications without improvement in her symptoms. She voices frustration over inability of her doctors to figure out what is causing her symptoms. Patient states that she has been eating and drinking well.    Patient is a 24 y.o. female presenting with syncope. The history is provided by the patient and medical records.  Loss of Consciousness Associated symptoms: shortness of breath   Associated symptoms: no chest pain, no fever, no headaches, no nausea and no vomiting     Past Medical History  Diagnosis Date  . WPW (Wolff-Parkinson-White syndrome)     a. s/p ablation (2011) Dr Gevena Cotton at [facility] (posterior lateral pathway)  . Vasovagal syncope     a. positive tilt 2011 b. failed medical therapy with Florinef, Midodrine, Inderal, Celexa  . POTS (postural orthostatic tachycardia syndrome)   . Palpitations     a. s/p MDT ILR implanted by Dr Alfonso Ellis  [facility])  . Headache   . Syncope     recurrent, daily   Past Surgical History  Procedure Laterality Date  . Cardiac electrophysiology study and ablation  2011    Dr. Gevena Cotton at [facility]- left posterior pathway ablation for WPW  . Tilt table study  2011    neurally mediated syncope  . Loop recorder implant  01/22/2014    MDT LINQ implanted by Dr Alfonso Ellis at [facility] for evaluation of palpitations/syncope  . Cardiac surgery     Family History  Problem Relation Age of Onset  . Diabetes Mother   . Hypertension Mother   . Stroke Mother   . Hypertension Brother   . Seizures Neg Hx    Social History  Substance Use Topics  . Smoking status: Never Smoker   . Smokeless tobacco: None  . Alcohol Use: No   OB History    No data available     Review of Systems  Constitutional: Negative for fever.  HENT: Negative for rhinorrhea and sore throat.   Eyes: Negative for redness.  Respiratory: Positive for shortness of breath. Negative for cough.   Cardiovascular: Positive for syncope. Negative for chest pain.  Gastrointestinal: Negative for nausea, vomiting, abdominal pain and diarrhea.  Genitourinary: Negative for dysuria.  Musculoskeletal: Negative for myalgias.  Skin: Negative for rash.  Neurological: Positive for syncope. Negative for headaches.      Allergies  Amitiza; Other; Ketorolac; Metoclopramide; Midodrine; and Fludrocortisone  Home Medications   Prior to Admission medications   Medication Sig Start Date End Date Taking? Authorizing Provider  Calcium-Magnesium-Vitamin D (CALCIUM MAGNESIUM PO) Take 2  tablets by mouth daily.   Yes Historical Provider, MD  Cholecalciferol (D-5000) 5000 UNITS TABS Take 5,000 Units by mouth daily.   Yes Historical Provider, MD  Coenzyme Q10 (COQ10 PO) Take 1 tablet by mouth daily.   Yes Historical Provider, MD  Cyanocobalamin (RA VITAMIN B-12 TR) 1000 MCG TBCR Take 1,000 mcg by mouth daily.   Yes Historical Provider, MD  docusate sodium  (COLACE) 100 MG capsule Take 1 capsule (100 mg total) by mouth 2 (two) times daily. 03/17/15  Yes Josalyn Funches, MD  ferrous gluconate (FERGON) 324 MG tablet Take 324 mg by mouth daily with breakfast.   Yes Historical Provider, MD  nadolol (CORGARD) 20 MG tablet Take 10 mg by mouth at bedtime.    Yes Historical Provider, MD  Omega-3 1000 MG CAPS Take 1 capsule by mouth daily.   Yes Historical Provider, MD  omega-3 acid ethyl esters (LOVAZA) 1 G capsule Take 1 g by mouth daily.   Yes Historical Provider, MD  Probiotic Product (PROBIOTIC PO) Take 1 tablet by mouth daily.   Yes Historical Provider, MD  sodium chloride 1 G tablet Take 1 g by mouth 2 (two) times daily.   Yes Historical Provider, MD  polyethylene glycol powder (GLYCOLAX/MIRALAX) powder Take 17 g by mouth 2 (two) times daily as needed. 02/13/15   Josalyn Funches, MD   BP 118/78 mmHg  Pulse 90  Temp(Src) 98.6 F (37 C) (Oral)  Resp 16  SpO2 100%  LMP 02/23/2015 Physical Exam  Constitutional: She is oriented to person, place, and time. She appears well-developed and well-nourished.  HENT:  Head: Normocephalic and atraumatic.  Right Ear: Tympanic membrane, external ear and ear canal normal.  Left Ear: Tympanic membrane, external ear and ear canal normal.  Nose: Nose normal.  Mouth/Throat: Uvula is midline, oropharynx is clear and moist and mucous membranes are normal.  Eyes: Conjunctivae, EOM and lids are normal. Pupils are equal, round, and reactive to light. Right eye exhibits no discharge. Left eye exhibits no discharge. Right eye exhibits no nystagmus. Left eye exhibits no nystagmus.  Neck: Normal range of motion. Neck supple.  Cardiovascular: Normal rate, regular rhythm and normal heart sounds.   Pulmonary/Chest: Effort normal and breath sounds normal. No respiratory distress. She has no wheezes. She has no rales.  Abdominal: Soft. There is no tenderness.  Musculoskeletal:       Cervical back: She exhibits normal range of  motion, no tenderness and no bony tenderness.  Neurological: She is alert and oriented to person, place, and time. She has normal strength and normal reflexes. No cranial nerve deficit or sensory deficit. She displays a negative Romberg sign. Coordination and gait normal. GCS eye subscore is 4. GCS verbal subscore is 5. GCS motor subscore is 6.  Skin: Skin is warm and dry.  Psychiatric: She has a normal mood and affect.  Nursing note and vitals reviewed.   ED Course  Procedures (including critical care time) Labs Review Labs Reviewed  POC URINE PREG, ED    Imaging Review No results found. I have personally reviewed and evaluated these images and lab results as part of my medical decision-making.   EKG Interpretation None      11:48 AM Patient seen and examined. Work-up initiated.   Vital signs reviewed and are as follows: BP 118/78 mmHg  Pulse 90  Temp(Src) 98.6 F (37 C) (Oral)  Resp 16  SpO2 100%  LMP 02/23/2015  EKG reviewed, NSR rate 85, no prolonged QT,  no Brugada syndrome, no signs of WPW.  12:24 PM EKG, urine pregnancy, orthostatics unremarkable. Patient is walking in hallway without difficulty.  Do not feel that patient has any indications for admission at this time. She appears well-hydrated and is tolerating oral fluids. Will discharge to home with follow-up by her care team. Encouraged return to the emergency department with worsening symptoms including persistent shortness of breath, additional episodes of syncope, chest pain, or other concerns. She verbalizes understanding and agrees with plan.   MDM   Final diagnoses:  Syncope, unspecified syncope type   Patient with multiple syncopal episodes over the past several years. She has [facility] emergency department [facility] over the past 6 months. [facility]. She has had a complete evaluation. Symptoms today are the same as previous. Screening tests as above are  unremarkable. Do not feel patient warrants admission at this time. Low suspicion for PE given her constellation of symptoms and risk factors. She was short of breath at time of incident however has no shortness of breath at this time is inability with normally without desaturation.      Renne CriglerJoshua Dinesh Ulysse, PA-C 03/21/15 1226  Azalia BilisKevin Campos, MD 03/21/15 [phone]

## 2015-03-21 NOTE — ED Notes (Addendum)
Pt. BIB EMS for complaint of syncopal episode this AM. Pt. States she had some SOB/CP prior to event. Pt. With hx of WPW and POTS. EMS reports no ekg changes. [facility] Admission for evaluation of heart, states PCP noted some valve regurgitation and backflow. States has been having intermittent CP at home.

## 2015-03-26 ENCOUNTER — Ambulatory Visit: Payer: Medicaid Other | Admitting: Family Medicine

## 2015-04-14 ENCOUNTER — Emergency Department (HOSPITAL_COMMUNITY)
Admission: EM | Admit: 2015-04-14 | Discharge: 2015-04-14 | Disposition: A | Discharge status: Home / Self Care | Payer: Medicaid Other | Source: Home / Self Care | Attending: Emergency Medicine | Admitting: Emergency Medicine

## 2015-04-14 ENCOUNTER — Encounter (HOSPITAL_COMMUNITY): Payer: Self-pay | Admitting: *Deleted

## 2015-04-14 ENCOUNTER — Ambulatory Visit: Payer: Medicaid Other | Admitting: Family Medicine

## 2015-04-14 DIAGNOSIS — Z9189 Other specified personal risk factors, not elsewhere classified: Secondary | ICD-10-CM

## 2015-04-14 DIAGNOSIS — Z8679 Personal history of other diseases of the circulatory system: Secondary | ICD-10-CM | POA: Diagnosis not present

## 2015-04-14 DIAGNOSIS — R531 Weakness: Secondary | ICD-10-CM | POA: Diagnosis not present

## 2015-04-14 DIAGNOSIS — R42 Dizziness and giddiness: Secondary | ICD-10-CM

## 2015-04-14 DIAGNOSIS — Z87898 Personal history of other specified conditions: Secondary | ICD-10-CM

## 2015-04-14 NOTE — Discharge Instructions (Signed)
For now continue the instructions are being given previously. No stimulants. No caffeine, decongestions, smoking or activities that increase the heart rate or your SVT. He may take ibuprofen for your headache. May also take Tylenol. For reoccurrence, worsening or rapid heart rate coated to the emergency department promptly. No driving or operating machinery or working in dangerous situations until after okayed by your cardiologist.

## 2015-04-14 NOTE — ED Provider Notes (Signed)
CSN: [id number]     Arrival date & time 04/14/15  1536 History   First MD Initiated Contact with Patient 04/14/15 1611     Chief Complaint  Patient presents with  . Dizziness   (Consider location/radiation/quality/duration/timing/severity/associated sxs/prior Treatment) HPI Comments: 25 year old female complaining of feeling lightheaded and chills and sensation of a racing heartbeat most of the day today. She states it is stopped by the time I had entered the room. She denies chest pain but states she often has pressure sensation in the chest. She denies URI symptoms. She occasionally feels short of breath. Her history is significant for WPW, pots syndrome, syncope and palpitations. In 2011 she underwent EP studies with subsequent ablation therapy. This apparently did not completely abate her episodes of SVT and has scheduled another ablation therapy at the end of this week. She was scheduled to see her PCP today but was late for the appointment then decided  to go to the urgent care. She was seen in the emergency department 3 weeks ago for syncope. She has been to the emergency department 22 times in the past 6 months as well as multiple visits to [facility] for evaluation of syncope and possible dysrhythmias. She has undergone several tests including  TEE, EKG, tilt table testing, orthostatics and others not recalled. During this visit to the ER her EKG was normal. No signs of ectopy or dysrhythmia or tachycardia dysrhythmic patterns. She did not have orthostasis. Vital signs were normal. EKG normal,, labs normal. She was discharged home for follow-up with PCP and cardiologist.  Patient is a 25 y.o. female presenting with dizziness.  Dizziness Associated symptoms: headaches     Past Medical History  Diagnosis Date  . WPW (Wolff-Parkinson-White syndrome)     a. s/p ablation (2011) Dr Gevena Cotton at [facility] (posterior lateral pathway)  . Vasovagal syncope     a. positive tilt 2011 b. failed  medical therapy with Florinef, Midodrine, Inderal, Celexa  . POTS (postural orthostatic tachycardia syndrome)   . Palpitations     a. s/p MDT ILR implanted by Dr Alfonso Ellis [facility])  . Headache   . Syncope     recurrent, daily   Past Surgical History  Procedure Laterality Date  . Cardiac electrophysiology study and ablation  2011    Dr. Gevena Cotton at [place]- left posterior pathway ablation for WPW  . Tilt table study  2011    neurally mediated syncope  . Loop recorder implant  01/22/2014    MDT LINQ implanted by Dr Alfonso Ellis at [facility] for evaluation of palpitations/syncope  . Cardiac surgery     Family History  Problem Relation Age of Onset  . Diabetes Mother   . Hypertension Mother   . Stroke Mother   . Hypertension Brother   . Seizures Neg Hx    Social History  Substance Use Topics  . Smoking status: Never Smoker   . Smokeless tobacco: None  . Alcohol Use: No   OB History    No data available     Review of Systems  Constitutional: Negative for fever, activity change and fatigue.  Respiratory: Negative for cough.        She states that sometimes she feels shortness of breath when her heart is racing. She does not feel that way at this time. She does feel mildly dizzy.  Cardiovascular:       Mild anterior chest pressure.  Gastrointestinal: Negative.   Musculoskeletal: Negative.   Skin: Negative.   Neurological: Positive for dizziness  and headaches. Negative for speech difficulty and numbness.    Allergies  Amitiza; Other; Ketorolac; Metoclopramide; Midodrine; and Fludrocortisone  Home Medications   Prior to Admission medications   Medication Sig Start Date End Date Taking? Authorizing Provider  Calcium-Magnesium-Vitamin D (CALCIUM MAGNESIUM PO) Take 2 tablets by mouth daily.    Historical Provider, MD  Cholecalciferol (D-5000) 5000 UNITS TABS Take 5,000 Units by mouth daily.    Historical Provider, MD  Coenzyme Q10 (COQ10 PO) Take 1 tablet by mouth daily.    Historical  Provider, MD  Cyanocobalamin (RA VITAMIN B-12 TR) 1000 MCG TBCR Take 1,000 mcg by mouth daily.    Historical Provider, MD  docusate sodium (COLACE) 100 MG capsule Take 1 capsule (100 mg total) by mouth 2 (two) times daily. 03/17/15   Dessa PhiJosalyn Funches, MD  ferrous gluconate (FERGON) 324 MG tablet Take 324 mg by mouth daily with breakfast.    Historical Provider, MD  nadolol (CORGARD) 20 MG tablet Take 10 mg by mouth at bedtime.     Historical Provider, MD  Omega-3 1000 MG CAPS Take 1 capsule by mouth daily.    Historical Provider, MD  omega-3 acid ethyl esters (LOVAZA) 1 G capsule Take 1 g by mouth daily.    Historical Provider, MD  polyethylene glycol powder (GLYCOLAX/MIRALAX) powder Take 17 g by mouth 2 (two) times daily as needed. 02/13/15   Dessa PhiJosalyn Funches, MD  Probiotic Product (PROBIOTIC PO) Take 1 tablet by mouth daily.    Historical Provider, MD  sodium chloride 1 G tablet Take 1 g by mouth 2 (two) times daily.    Historical Provider, MD   Meds Ordered and Administered this Visit  Medications - No data to display  BP 130/74 mmHg  Pulse 94  Temp(Src) 98.3 F (36.8 C) (Oral)  Resp 16  SpO2 100%  LMP 03/27/2015 Orthostatic VS for the past 24 hrs:  BP- Lying Pulse- Lying BP- Sitting Pulse- Sitting BP- Standing at 0 minutes Pulse- Standing at 0 minutes  04/14/15 1825 108/71 mmHg 93 127/78 mmHg 97 120/81 mmHg 105    Physical Exam  Constitutional: She is oriented to person, place, and time. She appears well-developed and well-nourished. No distress.  HENT:  Unable to visualize the oropharynx due to patient's untenable  tongue retraction.  Eyes: EOM are normal.  Neck: Normal range of motion. Neck supple.  Cardiovascular: Normal rate, regular rhythm, normal heart sounds and intact distal pulses.   Pulmonary/Chest: Effort normal. No respiratory distress. She has no wheezes. She has no rales.  Musculoskeletal: Normal range of motion. She exhibits no edema.  Neurological: She is alert  and oriented to person, place, and time. No cranial nerve deficit. She exhibits normal muscle tone.  Skin: Skin is warm and dry.  Psychiatric: She has a normal mood and affect. Her behavior is normal.  Nursing note and vitals reviewed.   ED Course  Procedures (including critical care time)  Labs Review Labs Reviewed - No data to display  Imaging Review No results found.   Visual Acuity Review  Right Eye Distance:   Left Eye Distance:   Bilateral Distance:    Right Eye Near:   Left Eye Near:    Bilateral Near:         MDM   1. Dizziness   2. Personal history of cardiac dysrhythmia   3. H/O syncope   4. Weakness    For now continue the instructions are being given previously. No stimulants. No caffeine, decongestions,  smoking or activities that increase the heart rate or your SVT. He may take ibuprofen for your headache. May also take Tylenol. For reoccurrence, worsening or rapid heart rate coated to the emergency department promptly. No driving or operating machinery or working in dangerous situations until after okayed by your cardiologist.     Hayden Rasmussen, NP 04/14/15 1843

## 2015-04-14 NOTE — ED Notes (Signed)
Pt reports  Symptoms  Of   dizzyness  /  Lightheaded         Feels  Like  Her heart  Is  Racing  At  Times       -  denys  Any  Current  Chest  Pain    Pt has a  History of  wpw         Pt  Is  Sitting  Upright on the  Exam table  Speaking in  Complete  sentances  And  Is  Awake  And  Alert

## 2015-04-19 ENCOUNTER — Emergency Department (HOSPITAL_COMMUNITY)
Admission: EM | Admit: 2015-04-19 | Discharge: 2015-04-19 | Disposition: A | Discharge status: Home / Self Care | Payer: Medicaid Other | Attending: Emergency Medicine | Admitting: Emergency Medicine

## 2015-04-19 ENCOUNTER — Encounter (HOSPITAL_COMMUNITY): Payer: Self-pay

## 2015-04-19 DIAGNOSIS — Z79899 Other long term (current) drug therapy: Secondary | ICD-10-CM | POA: Diagnosis not present

## 2015-04-19 DIAGNOSIS — Z8679 Personal history of other diseases of the circulatory system: Secondary | ICD-10-CM | POA: Diagnosis not present

## 2015-04-19 DIAGNOSIS — R002 Palpitations: Secondary | ICD-10-CM | POA: Insufficient documentation

## 2015-04-19 DIAGNOSIS — Z9889 Other specified postprocedural states: Secondary | ICD-10-CM | POA: Insufficient documentation

## 2015-04-19 DIAGNOSIS — R55 Syncope and collapse: Secondary | ICD-10-CM | POA: Diagnosis present

## 2015-04-19 DIAGNOSIS — Z3202 Encounter for pregnancy test, result negative: Secondary | ICD-10-CM | POA: Insufficient documentation

## 2015-04-19 LAB — CBC WITH DIFFERENTIAL/PLATELET
Basophils Absolute: 0 10*3/uL (ref 0.0–0.1)
Basophils Relative: 0 %
EOS PCT: 2 %
Eosinophils Absolute: 0.1 10*3/uL (ref 0.0–0.7)
HCT: 35 % — ABNORMAL LOW (ref 36.0–46.0)
Hemoglobin: 11.8 g/dL — ABNORMAL LOW (ref 12.0–15.0)
LYMPHS ABS: 3.2 10*3/uL (ref 0.7–4.0)
LYMPHS PCT: 43 %
MCH: 29.4 pg (ref 26.0–34.0)
MCHC: 33.7 g/dL (ref 30.0–36.0)
MCV: 87.1 fL (ref 78.0–100.0)
MONO ABS: 0.5 10*3/uL (ref 0.1–1.0)
Monocytes Relative: 6 %
Neutro Abs: 3.6 10*3/uL (ref 1.7–7.7)
Neutrophils Relative %: 49 %
PLATELETS: 188 10*3/uL (ref 150–400)
RBC: 4.02 MIL/uL (ref 3.87–5.11)
RDW: 11.8 % (ref 11.5–15.5)
WBC: 7.4 10*3/uL (ref 4.0–10.5)

## 2015-04-19 LAB — COMPREHENSIVE METABOLIC PANEL
ALT: 9 U/L — ABNORMAL LOW (ref 14–54)
AST: 18 U/L (ref 15–41)
Albumin: 3.9 g/dL (ref 3.5–5.0)
Alkaline Phosphatase: 45 U/L (ref 38–126)
Anion gap: 10 (ref 5–15)
BUN: 13 mg/dL (ref 6–20)
CHLORIDE: 104 mmol/L (ref 101–111)
CO2: 26 mmol/L (ref 22–32)
CREATININE: 0.71 mg/dL (ref 0.44–1.00)
Calcium: 9.5 mg/dL (ref 8.9–10.3)
Glucose, Bld: 73 mg/dL (ref 65–99)
POTASSIUM: 4.1 mmol/L (ref 3.5–5.1)
Sodium: 140 mmol/L (ref 135–145)
Total Bilirubin: <0.1 mg/dL — ABNORMAL LOW (ref 0.3–1.2)
Total Protein: 6.7 g/dL (ref 6.5–8.1)

## 2015-04-19 LAB — I-STAT TROPONIN, ED: Troponin i, poc: 0 ng/mL (ref 0.00–0.08)

## 2015-04-19 LAB — I-STAT BETA HCG BLOOD, ED (MC, WL, AP ONLY)

## 2015-04-19 LAB — MAGNESIUM: MAGNESIUM: 2.1 mg/dL (ref 1.7–2.4)

## 2015-04-19 MED ORDER — SODIUM CHLORIDE 0.9 % IV BOLUS (SEPSIS): 1000.0000 mL | Freq: Once | INTRAVENOUS | Status: DC

## 2015-04-19 MED ORDER — LORAZEPAM 2 MG/ML IJ SOLN: 0.5000 mg | Freq: Once | INTRAMUSCULAR | Status: DC

## 2015-04-19 NOTE — ED Provider Notes (Signed)
CSN: [id number]     Arrival date & time 04/19/15  1902 History   First MD Initiated Contact with Patient 04/19/15 1908     Chief Complaint  Patient presents with  . Loss of Consciousness     (Consider location/radiation/quality/duration/timing/severity/associated sxs/prior Treatment) Patient is a 25 y.o. female presenting with syncope.  Loss of Consciousness Episode history:  Single Most recent episode:  Today Progression:  Resolved Chronicity:  Recurrent Witnessed: yes   Relieved by:  Nothing Worsened by:  Nothing tried Ineffective treatments:  None tried Associated symptoms: palpitations and recent fall   Associated symptoms: no chest pain, no fever, no headaches, no nausea, no shortness of breath and no vomiting     Past Medical History  Diagnosis Date  . WPW (Wolff-Parkinson-White syndrome)     a. s/p ablation (2011) [facility] (post[facility] pathway)  . Vasovagal syncope     a. positive tilt 2011 b. failed medical therapy with Florinef, Midodrine, Inderal, Celexa  . POTS (postural orthostatic tachycardia syndrome)   . Palpitations     a. s/p MD[facility] IL[facility]  . Headache   . Syncope     recurrent, daily   Past Surgical History  Procedure Laterality Date  . Cardiac electrophysiology study and ablation  2011    [facility]- left posterior pathway ablation for WPW  . Tilt table study  2011    neurally mediated syncope  . Loop recorder implant  01/22/2014    MDT LINQ [facility] for evaluation of palpitations/syncope  . Cardiac surgery     Family History  Problem Relation Age of Onset  . Diabetes Mother   . Hypertension Mother   . Stroke Mother   . Hypertension Brother   . Seizures Neg Hx    Social History  Substance Use Topics  . Smoking status: Never Smoker   . Smokeless tobacco: None  . Alcohol Use: No   OB History    No data available     Review of Systems  Constitutional: Negative  for fever.  HENT: Negative for sore throat.   Eyes: Negative for visual disturbance.  Respiratory: Negative for cough and shortness of breath.   Cardiovascular: Positive for palpitations and syncope. Negative for chest pain.  Gastrointestinal: Negative for nausea, vomiting and abdominal pain.  Genitourinary: Negative for difficulty urinating.  Musculoskeletal: Negative for back pain and neck pain.  Skin: Negative for rash.  Neurological: Negative for syncope and headaches.      Allergies  Amitiza; Other; Ketorolac; Metoclopramide; Midodrine; and Fludrocortisone  Home Medications   Prior to Admission medications   Medication Sig Start Date End Date Taking? Authorizing Provider  Calcium-Magnesium-Vitamin D (CALCIUM MAGNESIUM PO) Take 2 tablets by mouth daily.    Historical Provider, MD  Cholecalciferol (D-5000) 5000 UNITS TABS Take 5,000 Units by mouth daily.    Historical Provider, MD  Coenzyme Q10 (COQ10 PO) Take 1 tablet by mouth daily.    Historical Provider, MD  Cyanocobalamin (RA VITAMIN B-12 TR) 1000 MCG TBCR Take 1,000 mcg by mouth daily.    Historical Provider, MD  docusate sodium (COLACE) 100 MG capsule Take 1 capsule (100 mg total) by mouth 2 (two) times daily. 03/17/15   Dessa PhiJosalyn Funches, MD  ferrous gluconate (FERGON) 324 MG tablet Take 324 mg by mouth daily with breakfast.    Historical Provider, MD  nadolol (CORGARD) 20 MG tablet Take 10 mg by mouth at bedtime.  Historical Provider, MD  Omega-3 1000 MG CAPS Take 1 capsule by mouth daily.    Historical Provider, MD  omega-3 acid ethyl esters (LOVAZA) 1 G capsule Take 1 g by mouth daily.    Historical Provider, MD  polyethylene glycol powder (GLYCOLAX/MIRALAX) powder Take 17 g by mouth 2 (two) times daily as needed. 02/13/15   [name], MD  Probiotic Product (PROBIOTIC PO) Take 1 tablet by mouth daily.    Historical Provider, MD  sodium chloride 1 G tablet Take 1 g by mouth 2 (two) times daily.    Historical  Provider, MD   BP 108/87 mmHg  Pulse 89  Temp(Src) 98.6 F (37 C) (Oral)  Resp 22  SpO2 100%  LMP 03/27/2015 Physical Exam  Constitutional: She is oriented to person, place, and time. She appears well-developed and well-nourished. No distress.  HENT:  Head: Normocephalic and atraumatic.  Eyes: Conjunctivae and EOM are normal.  Neck: Normal range of motion.  Cardiovascular: Normal rate, regular rhythm, normal heart sounds and intact distal pulses.  Exam reveals no gallop and no friction rub.   No murmur heard. Pulmonary/Chest: Effort normal and breath sounds normal. No respiratory distress. She has no wheezes. She has no rales.  Abdominal: Soft. She exhibits no distension. There is no tenderness. There is no guarding.  Musculoskeletal: She exhibits no edema or tenderness.  Neurological: She is alert and oriented to person, place, and time.  Skin: Skin is warm and dry. No rash noted. She is not diaphoretic. No erythema.  Nursing note and vitals reviewed.   ED Course  Procedures (including critical care time) Labs Review Labs Reviewed  CBC WITH DIFFERENTIAL/PLATELET - Abnormal; Notable for the following:    Hemoglobin 11.8 (*)    HCT 35.0 (*)    All other components within normal limits  COMPREHENSIVE METABOLIC PANEL - Abnormal; Notable for the following:    ALT 9 (*)    Total Bilirubin <0.1 (*)    All other components within normal limits  MAGNESIUM  I-STAT BETA HCG BLOOD, ED ([facility], [facility], [facility] ONLY)  I-STAT TROPOININ, ED  I-STAT TROPOININ, ED  I-STAT TROPOININ, ED    Imaging Review No results found. I have personally reviewed and evaluated these images and lab results as part of my medical decision-making.   EKG Interpretation None      MDM   Final diagnoses:  Syncope, unspecified syncope type   17 old female with a history of WPW status post ablation in 2011, with recurrent episodes of syncope since this time it unclear etiology, followed by Dr. Alfonso Ellis at [facility], with negative Loop recorder, and EP study done on Friday which showed no abnormalities, who presents with syncope. Denies trauma. Episodes similar to numerous prior. No history to suggest seizures.  EKG evaluated by me and shows sinus rhythm with no sign of prolonged QTc, no brugada, no sign of HOCM, no ST abnormalities. Patient monitored on telemetry without signs of arrhythmia. CBC, CMP, pregnancy tests within normal limits.  Patient with likely continuing syncope of unclear etiology.  Discussed with cardiology on-call for [facility], Dr. Sharol Harness, who felt given evaluation patient has had would not recommend admission or further intpt evaluation and continued evaluation with Dr. Alfonso Ellis is appropriate. Dr. Alfonso Ellis had recommended Ivabradine however this is too expensive and patient is currently seeking help obtaining the medication with her physician.  Discussed evaluation and results and recommend outpt follow up.  Patient discharged in stable condition with understanding of  reasons to return.      Alvira Monday, MD 04/20/15 [phone]

## 2015-04-19 NOTE — ED Notes (Signed)
Pt refused to let this RN start IV to admin fluids and draw blood. EDP aware, primary RN aware

## 2015-04-19 NOTE — Discharge Instructions (Signed)

## 2015-04-19 NOTE — ED Notes (Signed)
Per EMS - pt seen by Dr. Cristi [facility]Beatty [facility]. Hx POTS. [facility] today. This afternoon, pt had syncopal episode at home. No orthostatic chanes. Pulse ~100bpm. Denies hitting head. No pain at this time. C/o nausea

## 2015-04-30 ENCOUNTER — Telehealth: Payer: Self-pay | Admitting: Family Medicine

## 2015-04-30 ENCOUNTER — Ambulatory Visit: Payer: Medicaid Other | Attending: Family Medicine | Admitting: Family Medicine

## 2015-04-30 ENCOUNTER — Encounter: Payer: Self-pay | Admitting: Family Medicine

## 2015-04-30 VITALS — BP 96/65 | HR 90 | Temp 99.3°F | Resp 16 | Wt 141.0 lb

## 2015-04-30 DIAGNOSIS — Z79899 Other long term (current) drug therapy: Secondary | ICD-10-CM | POA: Insufficient documentation

## 2015-04-30 DIAGNOSIS — R569 Unspecified convulsions: Secondary | ICD-10-CM

## 2015-04-30 DIAGNOSIS — O924 Hypogalactia: Secondary | ICD-10-CM | POA: Diagnosis not present

## 2015-04-30 DIAGNOSIS — R55 Syncope and collapse: Secondary | ICD-10-CM | POA: Diagnosis not present

## 2015-04-30 DIAGNOSIS — E2740 Unspecified adrenocortical insufficiency: Secondary | ICD-10-CM

## 2015-04-30 DIAGNOSIS — M79651 Pain in right thigh: Secondary | ICD-10-CM | POA: Diagnosis not present

## 2015-04-30 DIAGNOSIS — G90A Postural orthostatic tachycardia syndrome (POTS): Secondary | ICD-10-CM

## 2015-04-30 DIAGNOSIS — I951 Orthostatic hypotension: Secondary | ICD-10-CM

## 2015-04-30 DIAGNOSIS — R0602 Shortness of breath: Secondary | ICD-10-CM

## 2015-04-30 DIAGNOSIS — R112 Nausea with vomiting, unspecified: Secondary | ICD-10-CM | POA: Diagnosis not present

## 2015-04-30 DIAGNOSIS — R000 Tachycardia, unspecified: Secondary | ICD-10-CM | POA: Diagnosis not present

## 2015-04-30 LAB — GLUCOSE, POCT (MANUAL RESULT ENTRY): POC GLUCOSE: 73 mg/dL (ref 70–99)

## 2015-04-30 MED ORDER — IVABRADINE HCL 5 MG PO TABS: 2.5000 mg | ORAL_TABLET | Freq: Two times a day (BID) | ORAL | Status: DC

## 2015-04-30 MED FILL — CORLANOR 5 MG TABLET: 5 | 30 days supply | Qty: 30 | Fill #0

## 2015-04-30 NOTE — Telephone Encounter (Signed)
The referral is for recurrent syncope. The exact diagnosis has not yet been established. This would be a hospital follow up as well.

## 2015-04-30 NOTE — Assessment & Plan Note (Signed)
R thigh pain post ablation Suspect insertion site nerve irritation No weakness on exam. No evidence of infection  Tonic water recommended for cramping

## 2015-04-30 NOTE — Assessment & Plan Note (Signed)
Negative seizure work up

## 2015-04-30 NOTE — Progress Notes (Signed)
Subjective:  Patient ID: Desiree Gutierrez, female    DOB: 1990/07/16  Age: 25 y.o. MRN: [id number]  CC: Follow-up   HPI Desiree Gutierrez presents for f/u POTs syndrome, hx of WPW s/p ablation, recurrent syncope    1. F/u syncope: since last OV she has been to ED twice and urgent care once. She was last in ED on 11 days ago for syncope. She has recurrent syncope. Recent seizure w/u negative. She continues under the care of her cardiologist, Dr. Alfonso Gutierrez, at [facility]. She had a negative EP study on 04/17/2015. The plan, per her cardilogist was to initiate ivabradine to control her heart rate. She has not yet been able to get the medication to the cost, $200 co-pay.   She reports R thigh and leg cramping since Ep study. The catheter was inserted on this side. She endorses continued CP, SOB, fatigue with intermittent palpitation and syncope. She has also had nausea and small volume emesis x 2 episode.  She denies cough,  Wheezing, cold symptoms.   Social History  Substance Use Topics  . Smoking status: Never Smoker   . Smokeless tobacco: Not on file  . Alcohol Use: No    Outpatient Prescriptions Prior to Visit  Medication Sig Dispense Refill  . Calcium-Magnesium-Vitamin D (CALCIUM MAGNESIUM PO) Take 2 tablets by mouth daily.    . Cholecalciferol (D-5000) 5000 UNITS TABS Take 5,000 Units by mouth daily.    . Coenzyme Q10 (COQ10 PO) Take 1 tablet by mouth daily.    . Cyanocobalamin (RA VITAMIN B-12 TR) 1000 MCG TBCR Take 1,000 mcg by mouth daily.    Marland Kitchen docusate sodium (COLACE) 100 MG capsule Take 1 capsule (100 mg total) by mouth 2 (two) times daily. 10 capsule 0  . ferrous gluconate (FERGON) 324 MG tablet Take 324 mg by mouth daily with breakfast.    . nadolol (CORGARD) 20 MG tablet Take 10 mg by mouth at bedtime.     . Omega-3 1000 MG CAPS Take 1 capsule by mouth daily.    Marland Kitchen omega-3 acid ethyl esters (LOVAZA) 1 G capsule Take 1 g by mouth daily.    . polyethylene glycol powder  (GLYCOLAX/MIRALAX) powder Take 17 g by mouth 2 (two) times daily as needed. 510 g 1  . Probiotic Product (PROBIOTIC PO) Take 1 tablet by mouth daily.    . sodium chloride 1 G tablet Take 1 g by mouth 2 (two) times daily.     No facility-administered medications prior to visit.    ROS Review of Systems  Constitutional: Positive for fatigue. Negative for fever and chills.  HENT: Positive for dental problem.   Eyes: Negative for visual disturbance.  Respiratory: Positive for shortness of breath.   Cardiovascular: Positive for palpitations. Negative for chest pain.  Gastrointestinal: Positive for nausea and vomiting. Negative for abdominal pain and blood in stool.  Musculoskeletal: Negative for back pain and arthralgias.  Skin: Negative for rash.  Allergic/Immunologic: Negative for immunocompromised state.  Neurological: Positive for syncope.  Hematological: Negative for adenopathy. Does not bruise/bleed easily.  Psychiatric/Behavioral: Negative for suicidal ideas and dysphoric mood.    Objective:  BP 96/65 mmHg  Pulse 90  Temp(Src) 99.3 F (37.4 C) (Oral)  Resp 16  Wt 141 lb (63.957 kg)  SpO2 98%  LMP 04/25/2015  BP/Weight 04/30/2015 04/19/2015 04/14/2015  Systolic BP 96 108 130  Diastolic BP 65 87 74  Wt. (Lbs) 141 - -  BMI 24.98 - -   Physical  Exam  Constitutional: She is oriented to person, place, and time. She appears well-developed and well-nourished. No distress.  HENT:  Head: Normocephalic and atraumatic.  Cardiovascular: Normal rate, regular rhythm, normal heart sounds and intact distal pulses.   Pulmonary/Chest: Effort normal and breath sounds normal.  Musculoskeletal: She exhibits tenderness (R upper thigh, there is no brusing or swelling at catheter insertion site). She exhibits no edema.  Neurological: She is alert and oriented to person, place, and time.  Skin: Skin is warm and dry. No rash noted.  Psychiatric: She has a normal mood and affect.    Lab Results   Component Value Date   HGBA1C 5.20 11/06/2014   CBG 73  Assessment & Plan:   Desiree Gutierrez was seen today for follow-up.  Diagnoses and all orders for this visit:  Hypogalactia -     POCT glucose (manual entry)  SOB (shortness of breath) -     Ambulatory referral to Pulmonology  Syncope, unspecified syncope type -     Ambulatory referral to Endocrinology  POTS (postural orthostatic tachycardia syndrome) -     ivabradine (CORLANOR) 5 MG TABS tablet; Take 0.5 tablets (2.5 mg total) by mouth 2 (two) times daily with a meal.  Observed seizure-like activity (HCC)  Right thigh pain   Follow-up: No Follow-up on file.   [name] MD

## 2015-04-30 NOTE — Progress Notes (Signed)
F/U SHOB, no energy  Pain scarel #8 Chest area  No tobacco user  No suicidal thought in the past two weeks

## 2015-04-30 NOTE — Telephone Encounter (Signed)
Endocrinology at [facility] can you please, let me know  the Diagnosis Thank you

## 2015-04-30 NOTE — Patient Instructions (Addendum)
Desiree Gutierrez was seen today for follow-up.  Diagnoses and all orders for this visit:  Hypogalactia -     POCT glucose (manual entry)  SOB (shortness of breath) -     Ambulatory referral to Pulmonology  Heat syncope, sequela -     Ambulatory referral to Endocrinology   These are the upcoming appt I see in your chart at [facility] Encounters Upcoming Encounters  Date Type Specialty Care Team Description  05/18/2015 Appointment Cardiology    07/23/2015 Appointment Cardiology Maryln Manuel, MD  [address]  [place], [place] [address]  [phone]  [phone] (Fax)     Please call Dr. Tillie Fantasia office to confirm if you have an appt 05/18/15. I will also call Dr. Tillie Fantasia office to confirm  Dr. Armen Pickup

## 2015-05-01 NOTE — Telephone Encounter (Signed)
The reason that I ask is because when call to schedule an appointment they told me that Syncope is not a diagnosis to see an Endocrinology.  Thank you .

## 2015-05-04 ENCOUNTER — Emergency Department (HOSPITAL_COMMUNITY)
Admission: EM | Admit: 2015-05-04 | Discharge: 2015-05-04 | Disposition: A | Discharge status: Home / Self Care | Payer: Medicaid Other | Attending: Emergency Medicine | Admitting: Emergency Medicine

## 2015-05-04 ENCOUNTER — Encounter (HOSPITAL_COMMUNITY): Payer: Self-pay | Admitting: Emergency Medicine

## 2015-05-04 ENCOUNTER — Other Ambulatory Visit: Payer: Self-pay

## 2015-05-04 DIAGNOSIS — R55 Syncope and collapse: Secondary | ICD-10-CM | POA: Diagnosis present

## 2015-05-04 DIAGNOSIS — R0602 Shortness of breath: Secondary | ICD-10-CM | POA: Diagnosis not present

## 2015-05-04 DIAGNOSIS — Z79899 Other long term (current) drug therapy: Secondary | ICD-10-CM | POA: Insufficient documentation

## 2015-05-04 DIAGNOSIS — R002 Palpitations: Secondary | ICD-10-CM | POA: Diagnosis not present

## 2015-05-04 LAB — CBC
HCT: 35.9 % — ABNORMAL LOW (ref 36.0–46.0)
Hemoglobin: 11.8 g/dL — ABNORMAL LOW (ref 12.0–15.0)
MCH: 29.4 pg (ref 26.0–34.0)
MCHC: 32.9 g/dL (ref 30.0–36.0)
MCV: 89.3 fL (ref 78.0–100.0)
PLATELETS: 207 10*3/uL (ref 150–400)
RBC: 4.02 MIL/uL (ref 3.87–5.11)
RDW: 11.8 % (ref 11.5–15.5)
WBC: 5.3 10*3/uL (ref 4.0–10.5)

## 2015-05-04 NOTE — ED Notes (Signed)
Pt arrives via EMS from home with syncopal episode while walking down the hallway at home this morning. Pt reports fall onto ceramic tile, c/o headache, right upper leg pain. Pt awake, alert, oriented x4, ambulatory to room from EMS stretcher. CBG initially 54, given food recheck was 101.

## 2015-05-04 NOTE — Discharge Instructions (Signed)

## 2015-05-04 NOTE — ED Provider Notes (Signed)
CSN: [id number]     Arrival date & time 05/04/15  1222 History   First MD Initiated Contact with Patient 05/04/15 1223     Chief Complaint  Patient presents with  . Loss of Consciousness      Patient is a 25 y.o. female presenting with syncope. The history is provided by the patient.  Loss of Consciousness Associated symptoms: palpitations and shortness of breath    patient resents with syncope. Had multiple visits for the same without a clear cause. She's been seen at [facility]. Recent negative EP study. Has question of pots, previous WPW. Question of previous vasovagal syncope. Recent started a new medication also. States she feels more dizzy. States she's had episodes like this in the  This is a little bit different. Recently started a new medication thinks it may be causing some of the problems  Past Medical History  Diagnosis Date  . WPW (Wolff-Parkinson-White syndrome)     a. s/p ablation (2011) Dr Desiree Gutierrez at [facility] (posterior lateral pathway)  . Vasovagal syncope     a. positive tilt 2011 b. failed medical therapy with Florinef, Midodrine, Inderal, Celexa  . POTS (postural orthostatic tachycardia syndrome)   . Palpitations     a. s/p MDT ILR implanted by Dr Desiree Gutierrez [facility])  . Headache   . Syncope     recurrent, daily   Past Surgical History  Procedure Laterality Date  . Cardiac electrophysiology study and ablation  2011    Dr. Gevena Gutierrez at [facility]- left posterior pathway ablation for WPW  . Tilt table study  2011    neurally mediated syncope  . Loop recorder implant  01/22/2014    MDT LINQ implanted by Dr Desiree Gutierrez at [facility] for evaluation of palpitations/syncope  . Cardiac surgery     Family History  Problem Relation Age of Onset  . Diabetes Mother   . Hypertension Mother   . Stroke Mother   . Hypertension Brother   . Seizures Neg Hx    Social History  Substance Use Topics  . Smoking status: Never Smoker   . Smokeless tobacco: None  . Alcohol Use: No   OB  History    No data available     Review of Systems  Respiratory: Positive for shortness of breath.   Cardiovascular: Positive for palpitations and syncope.  Musculoskeletal: Negative for back pain.  Skin: Negative for wound.  Neurological: Positive for syncope.  Hematological: Negative for adenopathy.      Allergies  Amitiza; Other; Ketorolac; Metoclopramide; Midodrine; and Fludrocortisone  Home Medications   Prior to Admission medications   Medication Sig Start Date End Date Taking? Authorizing Provider  Calcium-Magnesium-Vitamin D (CALCIUM MAGNESIUM PO) Take 2 tablets by mouth daily.   Yes Historical Provider, MD  Cholecalciferol (D-5000) 5000 UNITS TABS Take 5,000 Units by mouth daily.   Yes Historical Provider, MD  Coenzyme Q10 (COQ10 PO) Take 1 tablet by mouth daily.   Yes Historical Provider, MD  Cyanocobalamin (RA VITAMIN B-12 TR) 1000 MCG TBCR Take 1,000 mcg by mouth daily.   Yes Historical Provider, MD  ferrous gluconate (FERGON) 324 MG tablet Take 324 mg by mouth daily with breakfast.   Yes Historical Provider, MD  ivabradine (CORLANOR) 5 MG TABS tablet Take 0.5 tablets (2.5 mg total) by mouth 2 (two) times daily with a meal. 04/30/15  Yes Desiree Funches, MD  omega-3 acid ethyl esters (LOVAZA) 1 G capsule Take 1 g by mouth daily.   Yes Historical Provider, MD  Probiotic Product (PROBIOTIC PO) Take 1 tablet by mouth daily.   Yes Historical Provider, MD  sodium chloride 1 G tablet Take 1 g by mouth 2 (two) times daily.   Yes Historical Provider, MD  docusate sodium (COLACE) 100 MG capsule Take 1 capsule (100 mg total) by mouth 2 (two) times daily. Patient not taking: Reported on 05/04/2015 03/17/15   Desiree Phi, MD  Omega-3 1000 MG CAPS Take 1 capsule by mouth daily.    Historical Provider, MD  polyethylene glycol powder (GLYCOLAX/MIRALAX) powder Take 17 g by mouth 2 (two) times daily as needed. Patient not taking: Reported on 05/04/2015 02/13/15   Desiree Funches, MD    BP 105/64 mmHg  Pulse 87  Temp(Src) 98.9 F (37.2 C) (Oral)  Resp 22  Ht  (1.6 m)  Wt 137 lb (62.143 kg)  BMI 24.27 kg/m2  SpO2 99%  LMP 04/25/2015 Physical Exam  Constitutional: She appears well-developed.  HENT:  Head: Normocephalic.  Cardiovascular: Normal rate.   Pulmonary/Chest: Effort normal.  Abdominal: Soft.  Musculoskeletal: Normal range of motion.  Neurological: She is alert.  Skin: Skin is warm.    ED Course  Procedures (including critical care time) Labs Review Labs Reviewed  CBC - Abnormal; Notable for the following:    Hemoglobin 11.8 (*)    HCT 35.9 (*)    All other components within normal limits    Imaging Review No results found. I have personally reviewed and evaluated these images and lab results as part of my medical decision-making.   EKG Interpretation   Date/Time:  Monday May 04 2015 12:16:22 EST Ventricular Rate:  84 PR Interval:  122 QRS Duration: 91 QT Interval:  358 QTC Calculation: 423 R Axis:   73 Text Interpretation:  Sinus rhythm No significant change since last  tracing Confirmed by Desiree Payor  MD, Desiree Gutierrez [phone]) on 05/04/2015 12:53:24  PM      MDM   Final diagnoses:  Syncope, unspecified syncope type    Patient with syncope. History of same. Has had extensive workup in the past. Hemoglobin had been mildly decreased was rechecked and was stable. EKG stable. Has multiple follow-ups. Will discharge home.    Benjiman Core, MD 05/04/15 [phone]

## 2015-05-07 ENCOUNTER — Telehealth: Payer: Self-pay | Admitting: Family Medicine

## 2015-05-07 DIAGNOSIS — E2740 Unspecified adrenocortical insufficiency: Secondary | ICD-10-CM | POA: Insufficient documentation

## 2015-05-07 NOTE — Telephone Encounter (Signed)
Thank You Dr Armen Pickup  Before I saw your message I call Marilynne Drivers to f/u and patient has an appt  05-25-15 @ 3:30pm and she is aware .

## 2015-05-07 NOTE — Telephone Encounter (Signed)
OK. I have changed the diagnosis to adrenal insufficiency

## 2015-05-07 NOTE — Addendum Note (Signed)
Addended by: [name] on: 05/07/2015 09:15 AM   Modules accepted: Orders

## 2015-05-07 NOTE — Telephone Encounter (Signed)
I call patient to let her know about the endocrinology appt. Pt had a reaction to Corlanor medicine that she has to go on Monday to [facility] ER and one of the side effects is vision blurry .

## 2015-05-12 ENCOUNTER — Encounter (HOSPITAL_COMMUNITY): Payer: Self-pay

## 2015-05-12 DIAGNOSIS — R1012 Left upper quadrant pain: Secondary | ICD-10-CM | POA: Diagnosis present

## 2015-05-12 DIAGNOSIS — N898 Other specified noninflammatory disorders of vagina: Secondary | ICD-10-CM | POA: Diagnosis not present

## 2015-05-12 DIAGNOSIS — R102 Pelvic and perineal pain: Secondary | ICD-10-CM | POA: Insufficient documentation

## 2015-05-12 DIAGNOSIS — Z79899 Other long term (current) drug therapy: Secondary | ICD-10-CM | POA: Diagnosis not present

## 2015-05-12 DIAGNOSIS — Z8679 Personal history of other diseases of the circulatory system: Secondary | ICD-10-CM | POA: Diagnosis not present

## 2015-05-12 DIAGNOSIS — Z3202 Encounter for pregnancy test, result negative: Secondary | ICD-10-CM | POA: Insufficient documentation

## 2015-05-12 LAB — CBC
HCT: 38.7 % (ref 36.0–46.0)
Hemoglobin: 12.4 g/dL (ref 12.0–15.0)
MCH: 28.4 pg (ref 26.0–34.0)
MCHC: 32 g/dL (ref 30.0–36.0)
MCV: 88.6 fL (ref 78.0–100.0)
PLATELETS: 209 10*3/uL (ref 150–400)
RBC: 4.37 MIL/uL (ref 3.87–5.11)
RDW: 11.7 % (ref 11.5–15.5)
WBC: 8.5 10*3/uL (ref 4.0–10.5)

## 2015-05-12 LAB — COMPREHENSIVE METABOLIC PANEL
ALK PHOS: 47 U/L (ref 38–126)
ALT: 13 U/L — AB (ref 14–54)
AST: 19 U/L (ref 15–41)
Albumin: 4.3 g/dL (ref 3.5–5.0)
Anion gap: 11 (ref 5–15)
BILIRUBIN TOTAL: 0.3 mg/dL (ref 0.3–1.2)
BUN: 7 mg/dL (ref 6–20)
CALCIUM: 9.7 mg/dL (ref 8.9–10.3)
CO2: 25 mmol/L (ref 22–32)
CREATININE: 0.93 mg/dL (ref 0.44–1.00)
Chloride: 103 mmol/L (ref 101–111)
GFR calc Af Amer: >60 mL/min (ref >60)
Glucose, Bld: 117 mg/dL — ABNORMAL HIGH (ref 65–99)
Potassium: 4 mmol/L (ref 3.5–5.1)
Sodium: 139 mmol/L (ref 135–145)
TOTAL PROTEIN: 7.2 g/dL (ref 6.5–8.1)

## 2015-05-12 LAB — I-STAT BETA HCG BLOOD, ED (MC, WL, AP ONLY)

## 2015-05-12 LAB — LIPASE, BLOOD: Lipase: 37 U/L (ref 11–51)

## 2015-05-12 NOTE — ED Notes (Signed)
PER EMS: pts friend called EMS because pt was laying on the ground shaking. EMS arrived and found pt on ground shaking but easily aroused with EMS, no incontinence with episode. Pt was not post - ictal and EMS does not believe it was seizure like activity. Fire has seen pt do this before. Hx of WPW.  Pt reports prior to episode she was on the way to the ER for left sided abdominal pain. BP- 115/78, HR-96, 100% RA, CBG-104. Pt A&OX4, ambulatory. 12 lead unremarkable.

## 2015-05-13 ENCOUNTER — Emergency Department (HOSPITAL_COMMUNITY): Payer: Medicaid Other

## 2015-05-13 ENCOUNTER — Emergency Department (HOSPITAL_COMMUNITY)
Admission: EM | Admit: 2015-05-13 | Discharge: 2015-05-13 | Disposition: A | Discharge status: Home / Self Care | Payer: Medicaid Other | Attending: Emergency Medicine | Admitting: Emergency Medicine

## 2015-05-13 DIAGNOSIS — R102 Pelvic and perineal pain: Secondary | ICD-10-CM

## 2015-05-13 LAB — WET PREP, GENITAL
SPERM: NONE SEEN
Trich, Wet Prep: NONE SEEN
Yeast Wet Prep HPF POC: NONE SEEN

## 2015-05-13 LAB — URINALYSIS, ROUTINE W REFLEX MICROSCOPIC
Bilirubin Urine: NEGATIVE
Glucose, UA: NEGATIVE mg/dL
Hgb urine dipstick: NEGATIVE
Ketones, ur: NEGATIVE mg/dL
LEUKOCYTES UA: NEGATIVE
NITRITE: NEGATIVE
PROTEIN: NEGATIVE mg/dL
SPECIFIC GRAVITY, URINE: 1.012 (ref 1.005–1.030)
pH: 8 (ref 5.0–8.0)

## 2015-05-13 LAB — GC/CHLAMYDIA PROBE AMP (~~LOC~~) NOT AT ARMC
Chlamydia: NEGATIVE
Neisseria Gonorrhea: NEGATIVE

## 2015-05-13 MED ORDER — AZITHROMYCIN 250 MG PO TABS
1000.0000 mg | ORAL_TABLET | Freq: Once | ORAL | Status: AC
  Administered 2015-05-13: 1000 mg via ORAL
  Filled 2015-05-13: qty 4

## 2015-05-13 MED ORDER — CEFTRIAXONE SODIUM 250 MG IJ SOLR
250.0000 mg | Freq: Once | INTRAMUSCULAR | Status: AC
  Administered 2015-05-13: 250 mg via INTRAMUSCULAR
  Filled 2015-05-13: qty 250

## 2015-05-13 NOTE — ED Notes (Signed)
Pelvic cart at bedside. 

## 2015-05-13 NOTE — Discharge Instructions (Signed)
Abdominal Pain, Adult Desiree Gutierrez, you were treated for infections and will be called if it is positive.  See your primary care doctor within 3 days for close follow up.  If symptoms worsen, come back to the ED immediately.  Thank you.   Many things can cause belly (abdominal) pain. Most times, the belly pain is not dangerous. Many cases of belly pain can be watched and treated at home. HOME CARE   Do not take medicines that help you go poop (laxatives) unless told to by your doctor.  Only take medicine as told by your doctor.  Eat or drink as told by your doctor. Your doctor will tell you if you should be on a special diet. GET HELP IF:  You do not know what is causing your belly pain.  You have belly pain while you are sick to your stomach (nauseous) or have runny poop (diarrhea).  You have pain while you pee or poop.  Your belly pain wakes you up at night.  You have belly pain that gets worse or better when you eat.  You have belly pain that gets worse when you eat fatty foods.  You have a fever. GET HELP RIGHT AWAY IF:   The pain does not go away within 2 hours.  You keep throwing up (vomiting).  The pain changes and is only in the right or left part of the belly.  You have bloody or tarry looking poop. MAKE SURE YOU:   Understand these instructions.  Will watch your condition.  Will get help right away if you are not doing well or get worse.   This information is not intended to replace advice given to you by your health care provider. Make sure you discuss any questions you have with your health care provider.   Document Released: 09/07/2007 Document Revised: 04/11/2014 Document Reviewed: 11/28/2012 Elsevier Interactive Patient Education [employer]

## 2015-05-13 NOTE — ED Notes (Signed)
Pt and friend at bedside discussing ordering food to be delivered to room; pt does not appear to be in any distress

## 2015-05-13 NOTE — ED Provider Notes (Signed)
CSN: [id number]     Arrival date & time 05/12/15  2253 History  By signing my name below, I, Bethel Born, attest that this documentation has been prepared under the direction and in the presence of Tomasita Crumble, MD. Electronically Signed: Bethel Born, ED Scribe. 05/13/2015. 2:15 AM   Chief Complaint  Patient presents with  . Abdominal Pain    The history is provided by the patient. No language interpreter was used.   Desiree Gutierrez is a 25 y.o. female with PMHx of WPW syndrome, POTS, and recurrent syncope who presents to the Emergency Department complaining of new, sharp, left upper abdominal pain with onset 2 days ago. The pain radiates to the suprapubic region. Movement and palpation exacerbate the pain.  Associated symptoms include a syncopal episode, nausea, vomiting, urgency, dysuria, abnormal vaginal discharge. Pt denies diarrhea, hematuria, and vaginal bleeding.   Past Medical History  Diagnosis Date  . WPW (Wolff-Parkinson-White syndrome)     a. s/p ablation (2011) Dr Gevena Cotton at [facility] (posterior lateral pathway)  . Vasovagal syncope     a. positive tilt 2011 b. failed medical therapy with Florinef, Midodrine, Inderal, Celexa  . POTS (postural orthostatic tachycardia syndrome)   . Palpitations     a. s/p MDT ILR implanted by Dr Alfonso Ellis [facility])  . Headache   . Syncope     recurrent, daily   Past Surgical History  Procedure Laterality Date  . Cardiac electrophysiology study and ablation  2011    Dr. Gevena Cotton at [facility]- left posterior pathway ablation for WPW  . Tilt table study  2011    neurally mediated syncope  . Loop recorder implant  01/22/2014    MDT LINQ implanted by Dr Alfonso Ellis at [facility] for evaluation of palpitations/syncope  . Cardiac surgery     Family History  Problem Relation Age of Onset  . Diabetes Mother   . Hypertension Mother   . Stroke Mother   . Hypertension Brother   . Seizures Neg Hx    Social History  Substance Use Topics  . Smoking status:  Never Smoker   . Smokeless tobacco: None  . Alcohol Use: No   OB History    No data available     Review of Systems  10 Systems reviewed and all are negative for acute change except as noted in the HPI.  Allergies  Amitiza; Other; Ketorolac; Metoclopramide; Midodrine; and Fludrocortisone  Home Medications   Prior to Admission medications   Medication Sig Start Date End Date Taking? Authorizing Provider  Calcium-Magnesium-Vitamin D (CALCIUM MAGNESIUM PO) Take 2 tablets by mouth daily.    Historical Provider, MD  Cholecalciferol (D-5000) 5000 UNITS TABS Take 5,000 Units by mouth daily.    Historical Provider, MD  Coenzyme Q10 (COQ10 PO) Take 1 tablet by mouth daily.    Historical Provider, MD  Cyanocobalamin (RA VITAMIN B-12 TR) 1000 MCG TBCR Take 1,000 mcg by mouth daily.    Historical Provider, MD  docusate sodium (COLACE) 100 MG capsule Take 1 capsule (100 mg total) by mouth 2 (two) times daily. Patient not taking: Reported on 05/04/2015 03/17/15   [name], MD  ferrous gluconate (FERGON) 324 MG tablet Take 324 mg by mouth daily with breakfast.    Historical Provider, MD  ivabradine (CORLANOR) 5 MG TABS tablet Take 0.5 tablets (2.5 mg total) by mouth 2 (two) times daily with a meal. 04/30/15   Josalyn Funches, MD  Omega-3 1000 MG CAPS Take 1 capsule by mouth daily.  Historical Provider, MD  omega-3 acid ethyl esters (LOVAZA) 1 G capsule Take 1 g by mouth daily.    Historical Provider, MD  polyethylene glycol powder (GLYCOLAX/MIRALAX) powder Take 17 g by mouth 2 (two) times daily as needed. Patient not taking: Reported on 05/04/2015 02/13/15   [name], MD  Probiotic Product (PROBIOTIC PO) Take 1 tablet by mouth daily.    Historical Provider, MD  sodium chloride 1 G tablet Take 1 g by mouth 2 (two) times daily.    Historical Provider, MD   BP 118/78 mmHg  Pulse 92  Temp(Src) 98.7 F (37.1 C) (Oral)  Resp 16  SpO2 100%  LMP 04/25/2015 Physical Exam   Constitutional: She is oriented to person, place, and time. She appears well-developed and well-nourished. No distress.  HENT:  Head: Normocephalic and atraumatic.  Nose: Nose normal.  Mouth/Throat: Oropharynx is clear and moist. No oropharyngeal exudate.  Eyes: Conjunctivae and EOM are normal. Pupils are equal, round, and reactive to light. No scleral icterus.  Neck: Normal range of motion. Neck supple. No JVD present. No tracheal deviation present. No thyromegaly present.  Cardiovascular: Normal rate, regular rhythm and normal heart sounds.  Exam reveals no gallop and no friction rub.   No murmur heard. Pulmonary/Chest: Effort normal and breath sounds normal. No respiratory distress. She has no wheezes. She exhibits no tenderness.  Abdominal: Soft. Bowel sounds are normal. She exhibits no distension and no mass. There is no tenderness. There is no rebound and no guarding.  Genitourinary: Vaginal discharge found.  Mod vag DC, white, seen at cervix  Musculoskeletal: Normal range of motion. She exhibits no edema or tenderness.  Lymphadenopathy:    She has no cervical adenopathy.  Neurological: She is alert and oriented to person, place, and time. No cranial nerve deficit. She exhibits normal muscle tone.  Skin: Skin is warm and dry. No rash noted. No erythema. No pallor.  Nursing note and vitals reviewed.   ED Course  Procedures (including critical care time) DIAGNOSTIC STUDIES: Oxygen Saturation is 100% on RA,  normal by my interpretation.    COORDINATION OF CARE: 2:18 AM Discussed treatment plan which includes lab work, transvaginal US, and pelvic exam with pt at bedside and pt agreed to plan.  Labs Review Labs Reviewed  WET PREP, GENITAL - Abnormal; Notable for the following:    Clue Cells Wet Prep HPF POC PRESENT (*)    WBC, Wet Prep HPF POC FEW (*)    All other components within normal limits  COMPREHENSIVE METABOLIC PANEL - Abnormal; Notable for the following:    Glucose,  Bld 117 (*)    ALT 13 (*)    All other components within normal limits  LIPASE, BLOOD  CBC  URINALYSIS, ROUTINE W REFLEX MICROSCOPIC (NOT AT [facility])  I-STAT BETA HCG BLOOD, ED ([facility], [facility], [facility] ONLY)  GC/CHLAMYDIA PROBE AMP ([place]) NOT AT [facility]    Imaging Review US Transvaginal Non-ob  05/13/2015  CLINICAL DATA:  Acute onset of left lower quadrant abdominal pain. Initial encounter. EXAM: TRANSABDOMINAL AND TRANSVAGINAL ULTRASOUND OF PELVIS TECHNIQUE: Both transabdominal and transvaginal ultrasound examinations of the pelvis were performed. Transabdominal technique was performed for global imaging of the pelvis including uterus, ovaries, adnexal regions, and pelvic cul-de-sac. It was necessary to proceed with endovaginal exam following the transabdominal exam to visualize the endometrium and ovaries in greater detail. COMPARISON:  CT of the abdomen and pelvis performed 11/13/2014 FINDINGS: Uterus Measurements: 7.2 x 4.6 x 4.5 cm. No  fibroids or other mass visualized. Endometrium Thickness: 1.5 cm.  No focal abnormality visualized. Right ovary Measurements: 4.3 x 2.4 x 2.5 cm. Normal appearance/no adnexal mass. Left ovary Measurements: 5.1 x 2.8 x 3.5 cm. Normal appearance/no adnexal mass. Other findings Trace free fluid is seen within the pelvic cul-de-sac. IMPRESSION: Unremarkable pelvic ultrasound.  No evidence for ovarian torsion. Electronically Signed   By: Roanna Raider M.D.   On: 05/13/2015 03:36   US Pelvis Complete  05/13/2015  CLINICAL DATA:  Acute onset of left lower quadrant abdominal pain. Initial encounter. EXAM: TRANSABDOMINAL AND TRANSVAGINAL ULTRASOUND OF PELVIS TECHNIQUE: Both transabdominal and transvaginal ultrasound examinations of the pelvis were performed. Transabdominal technique was performed for global imaging of the pelvis including uterus, ovaries, adnexal regions, and pelvic cul-de-sac. It was necessary to proceed with endovaginal exam following the transabdominal exam to  visualize the endometrium and ovaries in greater detail. COMPARISON:  CT of the abdomen and pelvis performed 11/13/2014 FINDINGS: Uterus Measurements: 7.2 x 4.6 x 4.5 cm. No fibroids or other mass visualized. Endometrium Thickness: 1.5 cm.  No focal abnormality visualized. Right ovary Measurements: 4.3 x 2.4 x 2.5 cm. Normal appearance/no adnexal mass. Left ovary Measurements: 5.1 x 2.8 x 3.5 cm. Normal appearance/no adnexal mass. Other findings Trace free fluid is seen within the pelvic cul-de-sac. IMPRESSION: Unremarkable pelvic ultrasound.  No evidence for ovarian torsion. Electronically Signed   By: Roanna Raider M.D.   On: 05/13/2015 03:36   I have personally reviewed and evaluated these images and lab results as part of my medical decision-making.   EKG Interpretation   Date/Time:  Wednesday May 13 2015 02:42:25 EST Ventricular Rate:  80 PR Interval:  129 QRS Duration: 89 QT Interval:  363 QTC Calculation: 419 R Axis:   75 Text Interpretation:  Sinus arrhythmia No significant change since last  tracing Confirmed by Erroll Luna [phone]) on 05/13/2015 6:12:27 AM      MDM   Final diagnoses:  None    Patient presents to the ED for abdominal pain.  PE is unremarkable.  She was treated for STD for DC seen in the vagina.  She is not requesting anything for pain.  TVUS is normal.  She appears well and in NAD.  VS remain within her normal limits and she is safe for DC.   I personally performed the services described in this documentation, which was scribed in my presence. The recorded information has been reviewed and is accurate.     Tomasita Crumble, MD 05/13/15 [phone]

## 2015-05-13 NOTE — ED Notes (Signed)
Patient transported to X-ray 

## 2015-05-21 ENCOUNTER — Other Ambulatory Visit: Payer: Medicaid Other

## 2015-05-21 ENCOUNTER — Ambulatory Visit (INDEPENDENT_AMBULATORY_CARE_PROVIDER_SITE_OTHER): Payer: Medicaid Other | Admitting: Internal Medicine

## 2015-05-21 ENCOUNTER — Encounter: Payer: Self-pay | Admitting: Internal Medicine

## 2015-05-21 ENCOUNTER — Telehealth: Payer: Self-pay | Admitting: Internal Medicine

## 2015-05-21 VITALS — BP 114/70 | HR 100 | Ht 63.0 in | Wt 141.4 lb

## 2015-05-21 DIAGNOSIS — R0600 Dyspnea, unspecified: Secondary | ICD-10-CM

## 2015-05-21 DIAGNOSIS — R0689 Other abnormalities of breathing: Principal | ICD-10-CM

## 2015-05-21 DIAGNOSIS — R0602 Shortness of breath: Secondary | ICD-10-CM

## 2015-05-21 LAB — NITRIC OXIDE: Nitric Oxide: 5

## 2015-05-21 NOTE — Addendum Note (Signed)
Addended by: Sheran Luz on: 05/21/2015 03:21 PM   Modules accepted: Orders

## 2015-05-21 NOTE — Progress Notes (Signed)
Subjective:    Patient ID: Desiree Gutierrez, female    DOB: [birth year], 25 y.o.   MRN: [id number]  Desiree Ends, MD -  PCP     HPI  IOV 05/21/2015  Chief Complaint  Patient presents with  . Pulmonary Consult    Pt referred by Dr. Adrian Gutierrez for SOB x 3 months. Pt states the SOB occurs with activty and rest. Pt c/o non prod cough with chest congestion and midsternal chest tightness when SOB or coughing - pain radiates to mid back.      25 year old female self-referred for shortness of breath. History is given by her and by review of chart from [facility] and color on emergency room records.   She reports a diagnosis of Wolff-Parkinson-White syndrome for the last 6 years and is status post ablation in 2011 but despite that has had arrhythmia issues. According to her history she has tried several antiarrhythmics but has had intolerance issues with all of them. She follows with Dr. Yvonna Gutierrez at [facility] a little physiology department. She reports that for the last 3 months she's had insidious onset of shortness of breath that is severe. These happen at rest and is episodic and also with exertion. They're relieved by rest. It is associated with tachycardia and also bradycardia that she feels subjectively. She says that she has had 12 emergency room visits in the last 6 months all  Related to shortness of breath and apparently she's been hypoxemic when EMS picks her up. When I review the emergency room records it appears that she probably has only one ER visit a month. A lot of this is due to syncope. I could not find any evidence of hypoxemia documented. Although she did have November 2016 seen CT angiogram chest that ruled out pulmonary emboli and she had clear lung fields. I personally visualized this image.   other lab work for May 12 2015 include normal electrolytes and creatinine. Normal liver function tests and a normal hemoglobin of 12.4 g percent.    today in the  office we did exhaled nitric oxide for asthma and it is normal less than 5 ppb. We also walked her 185 feet 3 laps on room air and she did not desaturate or get tachycardic.   problem list from [facility] Syncope - Vasovagal - failed fludricortisone, midodrine, inderal, citalopram - Ziopatch - 7/30-8/12/15: Min HR 57 max 153 bpm, Ave 91 pbm. Predominant 91 bpm, symptoms occurred during sinus rhythm - multiple ED visits In 2015 for recurrenc syncope - Last episode 03/10/2015 treated at [facility] ED per patient (see notes in [place]), had episode of symptomatic hyoglycemia in clinic today  Possilble POTS - reportedly positive tilt table test in distant past but negative tilt table here more recently Palpitations - h/o WPW s/p left posterior pathway ablation by Dr. 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  -  Echocardiogram 03/15/2015: Left ventricle ejection fraction is normal. Right ventricle is normal. Overall echo was reported as normal.   04/17/2015: Underwent EP study at [facility] by Dr. Amedeo Gutierrez. According to the patient there was a foci that could not be ablated but according to my review of the notes of the procedure it was a  negative EP study with 4 hours flat time. Notes indicate that tachycardia could not be induced with aggressive straight pacing an extra stimuli.   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  But there were plans to initiate Ivabradine  For which she could not afford   other lab workup 08/16/2011 -  Autoimmune lab work including anti-neuronal nuclear antibody , acetal colon receptor binding antibody an other antibodies are negative. But I do not  see evidence of her having had autoimmune collagen vascular disease workup    has a past medical history of WPW (Wolff-Parkinson-White syndrome); Vasovagal syncope; POTS (postural orthostatic tachycardia syndrome); Palpitations; and Headache.   reports that she has never smoked. She has never used smokeless tobacco.  Past Surgical History  Procedure Laterality Date  . Cardiac electrophysiology study and ablation  2011    Dr. Thomasenia Gutierrez at [facility]- left posterior pathway ablation for WPW  . Tilt table study  2011    neurally mediated syncope  . Loop recorder implant  01/22/2014    MDT LINQ implanted by Dr Desiree Gutierrez at [facility] for evaluation of palpitations/syncope    Allergies  Allergen Reactions  . Amitiza [Lubiprostone] Anaphylaxis, Shortness Of Breath and Swelling  . Other Palpitations  . Ketorolac Palpitations  . Metoclopramide Other (See Comments)    Heart raced  . Midodrine Other (See Comments)    migraines  . Fludrocortisone Rash    Immunization History  Administered Date(s) Administered  . PPD Test 10/08/2014  . Pneumococcal Polysaccharide-23 10/17/2014    Family History  Problem Relation Age of Onset  . Diabetes Mother   . Hypertension Mother   . Stroke Mother   . Hypertension Brother   . Seizures Neg Hx      Current outpatient prescriptions:  .  Calcium-Magnesium-Vitamin D (CALCIUM MAGNESIUM PO), Take 2 tablets by mouth daily., Disp: , Rfl:  .  Cholecalciferol (D-5000) 5000 UNITS TABS, Take 5,000 Units by mouth daily., Disp: , Rfl:  .  Coenzyme Q10 (COQ10 PO), Take 1 tablet by mouth daily., Disp: , Rfl:  .  Cyanocobalamin (RA VITAMIN B-12 TR) 1000 MCG TBCR, Take 1,000 mcg by mouth daily., Disp: , Rfl:  .  ferrous gluconate (FERGON) 324 MG tablet, Take 324 mg by mouth daily with breakfast., Disp: , Rfl:  .  Omega-3 1000 MG CAPS, Take 1 capsule by mouth daily., Disp: , Rfl:  .  Probiotic Product (PROBIOTIC PO), Take 1 tablet by mouth daily., Disp: , Rfl:  .   sodium chloride 1 G tablet, Take 1 g by mouth 2 (two) times daily., Disp: , Rfl:       Review of Systems  Constitutional: Negative for fever and unexpected weight change.  HENT: Negative for congestion, dental problem, ear pain, nosebleeds, postnasal drip, rhinorrhea, sinus pressure, sneezing, sore throat and trouble swallowing.   Eyes: Negative for redness and itching.  Respiratory: Positive for cough and shortness of breath. Negative for chest tightness and wheezing.   Cardiovascular: Negative for palpitations and leg swelling.  Gastrointestinal: Positive for nausea. Negative for vomiting.  Genitourinary: Negative for dysuria.  Musculoskeletal: Negative for joint swelling.  Skin: Negative for rash.  Neurological: Negative for headaches.  Hematological: Does not bruise/bleed easily.  Psychiatric/Behavioral: Negative for dysphoric mood. The patient is not nervous/anxious.        Objective:   Physical Exam  Constitutional: She is oriented to person, place, and time. She appears well-developed and well-nourished. No distress.  HENT:  Head: Normocephalic and atraumatic.  Right Ear: External ear normal.  Left Ear: External ear normal.  Mouth/Throat: Oropharynx is clear and moist. No oropharyngeal exudate.  Eyes: Conjunctivae and EOM are normal. Pupils are equal, round, and reactive to light. Right eye exhibits no discharge. Left eye exhibits no discharge. No scleral icterus.  Neck: Normal range of motion. Neck supple. No JVD present. No tracheal deviation present. No thyromegaly present.  Cardiovascular: Normal rate, regular rhythm, normal heart sounds and intact distal pulses.  Exam reveals no gallop and no friction rub.   No murmur heard. Pulmonary/Chest: Effort normal and breath sounds normal. No respiratory distress. She has no wheezes. She has no rales. She exhibits no tenderness.  Abdominal: Soft. Bowel sounds are normal. She exhibits no distension and no mass. There is no  tenderness. There is no rebound and no guarding.  Musculoskeletal: Normal range of motion. She exhibits no edema or tenderness.  Lymphadenopathy:    She has no cervical adenopathy.  Neurological: She is alert and oriented to person, place, and time. She has normal reflexes. No cranial nerve deficit. She exhibits normal muscle tone. Coordination normal.  Skin: Skin is warm and dry. No rash noted. She is not diaphoretic. No erythema. No pallor.  Psychiatric: She has a normal mood and affect. Her behavior is normal. Judgment and thought content normal.  Vitals reviewed.   Filed Vitals:   05/21/15 1025  BP: 114/70  Pulse: 100  Height: '5\' 3"'$  (1.6 m)  Weight: 141 lb 6.4 oz (64.139 kg)  SpO2: 97%         Assessment & Plan:     ICD-9-CM ICD-10-CM   1. Dyspnea and respiratory abnormality 786.09 R06.00     R06.89       no evidence of asthma  Unclear why you're short of breath   Plan -  Rule out autoimmune processes for collagen vascular disease   Serum: ESR, ACE, ANA, DS-DNA, RF, anti-CCP, ssA, ssB, scl-70, ANCA screen, MPO, PR-3, Total CK,  RNP, Aldolase,  -  Do full pulmonary function test -  Once I get clearance from Dr. Gelene Gutierrez we will have you do cardiopulmonary stress testing on a bike  With EIB challenge   Follow-up -  Depending on lab results   Dr. Brand Males, M.D., [facility].C.P Pulmonary and Critical Care Medicine Staff Physician [place] [facility] Pager: [phone], If no answer or between  15:00h - 7:00h: call [phone]  05/21/2015 11:02 AM

## 2015-05-21 NOTE — Telephone Encounter (Signed)
Desiree Gutierrez  pls call dr Donnetta Simpers at [facility] and ask if safe for her to do CPST. IF so, arrange for cpst bike with eib challenge and fu to see me after that  Dr. Kalman Shan, M.D., [facility].C.P Pulmonary and Critical Care Medicine Staff Physician [facility] [place] [facility] Pager: [phone], If no answer or between  15:00h - 7:00h: call [phone]  05/21/2015 11:11 AM

## 2015-05-21 NOTE — Patient Instructions (Signed)
ICD-9-CM ICD-10-CM   1. Dyspnea and respiratory abnormality 786.09 R06.00     R06.89      no evidence of asthma  Unclear why you're short of breath   Plan -  Rule out autoimmune processes for collagen vascular disease   Serum: ESR, ACE, ANA, DS-DNA, RF, anti-CCP, ssA, ssB, scl-70, ANCA screen, MPO, PR-3, Total CK,  RNP, Aldolase,  -  Do full pulmonary function test -  Once I get clearance from Dr. Gelene Mink we will have you do cardiopulmonary stress testing on a bike  With EIB challenge   Follow-up -  Depending on lab results

## 2015-05-21 NOTE — Telephone Encounter (Signed)
[facility], placed on long hold. WCB.

## 2015-05-22 ENCOUNTER — Encounter: Payer: Self-pay | Admitting: Family Medicine

## 2015-05-22 ENCOUNTER — Ambulatory Visit (HOSPITAL_BASED_OUTPATIENT_CLINIC_OR_DEPARTMENT_OTHER): Payer: Medicaid Other | Admitting: Clinical

## 2015-05-22 ENCOUNTER — Ambulatory Visit: Payer: Medicaid Other | Attending: Family Medicine | Admitting: Family Medicine

## 2015-05-22 VITALS — BP 102/64 | HR 96 | Temp 99.3°F | Resp 16 | Ht 63.0 in | Wt 140.0 lb

## 2015-05-22 DIAGNOSIS — I456 Pre-excitation syndrome: Secondary | ICD-10-CM | POA: Insufficient documentation

## 2015-05-22 DIAGNOSIS — Z636 Dependent relative needing care at home: Secondary | ICD-10-CM

## 2015-05-22 DIAGNOSIS — Z638 Other specified problems related to primary support group: Secondary | ICD-10-CM

## 2015-05-22 DIAGNOSIS — R079 Chest pain, unspecified: Secondary | ICD-10-CM | POA: Diagnosis not present

## 2015-05-22 DIAGNOSIS — Z639 Problem related to primary support group, unspecified: Secondary | ICD-10-CM | POA: Insufficient documentation

## 2015-05-22 DIAGNOSIS — R0789 Other chest pain: Secondary | ICD-10-CM | POA: Diagnosis not present

## 2015-05-22 DIAGNOSIS — F439 Reaction to severe stress, unspecified: Secondary | ICD-10-CM

## 2015-05-22 DIAGNOSIS — R55 Syncope and collapse: Secondary | ICD-10-CM | POA: Diagnosis present

## 2015-05-22 DIAGNOSIS — R0602 Shortness of breath: Secondary | ICD-10-CM | POA: Insufficient documentation

## 2015-05-22 DIAGNOSIS — E162 Hypoglycemia, unspecified: Secondary | ICD-10-CM | POA: Diagnosis not present

## 2015-05-22 LAB — GLUCOSE, POCT (MANUAL RESULT ENTRY): POC GLUCOSE: 111 mg/dL — AB (ref 70–99)

## 2015-05-22 MED ORDER — RANITIDINE HCL 150 MG PO TABS: 150.0000 mg | ORAL_TABLET | Freq: Two times a day (BID) | ORAL | Status: DC

## 2015-05-22 NOTE — Patient Instructions (Addendum)
[place] was seen today for loss of consciousness.  Diagnoses and all orders for this visit:  Hypoglycemia -     POCT glucose (manual entry)  Other chest pain -     ranitidine (ZANTAC) 150 MG tablet; Take 1 tablet (150 mg total) by mouth 2 (two) times daily.  Stress at home    Start zantac for chest pain Start with with supper only for first week, then twice daily after that   Restart therapy  I will look into medical alert bracelets and be in touch.   F/u in 3 weeks for chest pain and shortness of breath   Dr. Armen Pickup

## 2015-05-22 NOTE — Progress Notes (Signed)
Subjective:  Patient ID: Desiree Gutierrez, female    DOB: 1990-09-04  Age: 25 y.o. MRN: [id number]  CC: No chief complaint on file.   HPI Desiree Gutierrez presents for f/u POTs syndrome, hx of WPW s/p ablation, recurrent syncope    1. F/u syncope: since last OV she has been to ED twice.  She was ED on 05/04/15 for syncope and witnessed tremor.  This was in setting of starting corlanor 2.5 mg BID on 04/30/2015.  She was seen in the ED on 05/13/15 for abdominal pain, treated for STDs, normal TVUS, wet prep revealed BV only. Yesterday she was evaluated by pulmonology for persistent SOB. Initial evaluation unrevealing. Pulmonology planning cardio/pulmonary bicycle stress test if she is cleared by her cardiologist.   She wants to return to school. 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 She is open to counseling services. She received talk therapy while at school and found it helpful. She admits to high stress related to caring for her mom and to her own medical problems.   2. Chest pain: still with SOB and CP that occurs most nights. Wakes her up from sleep. There is no associated cough, fever or cold symptoms.   Social History  Substance Use Topics  . Smoking status: Never Smoker   . Smokeless tobacco: Never Used  . Alcohol Use: No    Outpatient Prescriptions Prior to Visit  Medication Sig Dispense Refill  . Calcium-Magnesium-Vitamin D (CALCIUM MAGNESIUM PO) Take 2 tablets by mouth daily.    . Cholecalciferol (D-5000) 5000 UNITS TABS Take 5,000 Units by mouth daily.    . Coenzyme Q10 (COQ10 PO) Take 1 tablet by mouth daily.    . Cyanocobalamin (RA VITAMIN B-12 TR) 1000 MCG TBCR Take 1,000 mcg by mouth daily.    . ferrous gluconate (FERGON) 324 MG tablet Take 324 mg by mouth daily with breakfast.    . Omega-3 1000 MG CAPS Take 1 capsule by mouth daily.    . Probiotic Product (PROBIOTIC PO) Take 1 tablet by mouth daily.    . sodium chloride 1 G tablet Take 1 g by mouth 2  (two) times daily.     No facility-administered medications prior to visit.    ROS Review of Systems  Constitutional: Positive for appetite change (always feels hungry and never satisfied ) and fatigue. Negative for fever and chills.  HENT: Positive for dental problem.   Eyes: Negative for visual disturbance.  Respiratory: Positive for shortness of breath.   Cardiovascular: Positive for chest pain and palpitations.  Gastrointestinal: Positive for nausea and vomiting. Negative for abdominal pain and blood in stool.  Musculoskeletal: Negative for back pain and arthralgias.  Skin: Negative for rash.  Allergic/Immunologic: Negative for immunocompromised state.  Neurological: Positive for syncope.  Hematological: Negative for adenopathy. Does not bruise/bleed easily.  Psychiatric/Behavioral: Negative for suicidal ideas and dysphoric mood.    Objective:  BP 102/64 mmHg  Pulse 96  Temp(Src) 99.3 F (37.4 C) (Oral)  Resp 16  Ht  (1.6 m)  Wt 140 lb (63.504 kg)  BMI 24.81 kg/m2  SpO2 98%  LMP 04/25/2015  BP/Weight 05/22/2015 05/21/2015 05/13/2015  Systolic BP 102 114 111  Diastolic BP 64 70 93  Wt. (Lbs) 140 141.4 -  BMI 24.81 25.05 -   Physical Exam  Constitutional: She is oriented to person, place, and time. She appears well-developed and well-nourished. No distress.  HENT:  Head: Normocephalic and atraumatic.  Cardiovascular: Normal rate, regular  rhythm, normal heart sounds and intact distal pulses.   Pulmonary/Chest: Effort normal and breath sounds normal.  Musculoskeletal: She exhibits no edema or tenderness.  Neurological: She is alert and oriented to person, place, and time.  Skin: Skin is warm and dry. No rash noted.  Psychiatric: She has a normal mood and affect.   Lab Results  Component Value Date   HGBA1C 5.20 11/06/2014   CBG 111 Assessment & Plan:   Desiree Gutierrez was seen today for loss of consciousness.  Diagnoses and all orders for this  visit:  Hypoglycemia -     POCT glucose (manual entry)  Other chest pain -     ranitidine (ZANTAC) 150 MG tablet; Take 1 tablet (150 mg total) by mouth 2 (two) times daily.  Stress at home   Follow-up: No Follow-up on file.   [name] MD

## 2015-05-22 NOTE — Progress Notes (Signed)
ASSESSMENT: Pt currently experiencing caregiver stress, needs to f/u with PCP. Pt would benefit from outpatient therapy.  Stage of Change: contemplative  PLAN: 1. F/U with behavioral health consultant in as needed 2. Psychiatric Medications: Zantac. 3. Behavioral recommendation(s):   -Consider calling [facility] at [phone] for outpatient counseling or walk in clinic at [facility] of the [place]  SUBJECTIVE: Pt. referred by Dr Armen Pickup for caregiver stress:  Pt. reports the following symptoms/concerns: Pt does not want to talk today, primary concern is finding outpatient counseling, as she is no longer able to access counseling services at [employer], when she had to quit school to take care of her disabled mother.  Duration of problem: At least two months Severity: mild  OBJECTIVE: Orientation & Cognition: Oriented x3. Thought processes normal and appropriate to situation. Mood: appropriate. Affect: appropriate Appearance: appropriate Risk of harm to self or others: no known risk of harm to self or others Substance use: none Assessments administered: PHQ9: 6/ GAD7: 3  Diagnosis: Caregiver stress CPT Code: Z63.6 -------------------------------------------- Other(s) present in the room: none  Time spent with patient in exam room: 7 minutes, 2:50-2:57pm   . Depression screen [facility] 2/9 05/22/2015 04/30/2015 03/17/2015 02/02/2015 02/02/2015  Decreased Interest 0 0 0 0 0  Down, Depressed, Hopeless 0 0 0 0 0  PHQ - 2 Score 0 0 0 0 0  Altered sleeping 1 3 - - -  Tired, decreased energy 3 3 - - -  Change in appetite 0 0 - - -  Feeling bad or failure about yourself  1 0 - - -  Trouble concentrating 1 0 - - -  Moving slowly or fidgety/restless 0 0 - - -  Suicidal thoughts 0 0 - - -  PHQ-9 Score 6 6 - - -   GAD 7 : Generalized Anxiety Score 05/22/2015 04/30/2015  Nervous, Anxious, on Edge 0 0  Control/stop worrying 0 0  Worry too much - different things 1  0  Trouble relaxing 1 0  Restless 0 0  Easily annoyed or irritable 1 0  Afraid - awful might happen 0 0  Total GAD 7 Score 3 0

## 2015-05-22 NOTE — Assessment & Plan Note (Signed)
A: chronic stress at home P: CSW referral for counselor recommendations

## 2015-05-22 NOTE — Assessment & Plan Note (Signed)
A: chest pain and SOB nightly, r/o GERD P: H2 blocker ordered F.u in 3 weeks

## 2015-05-22 NOTE — Progress Notes (Signed)
HFU stated had low glucose and SHOB No pain today  No tobacco user  No suicidal thoughts in the past two weeks

## 2015-05-26 ENCOUNTER — Ambulatory Visit (HOSPITAL_COMMUNITY)
Admission: RE | Admit: 2015-05-26 | Discharge: 2015-05-26 | Disposition: A | Discharge status: Home / Self Care | Payer: Medicaid Other | Source: Ambulatory Visit | Attending: Internal Medicine | Admitting: Internal Medicine

## 2015-05-26 DIAGNOSIS — R0689 Other abnormalities of breathing: Secondary | ICD-10-CM | POA: Insufficient documentation

## 2015-05-26 DIAGNOSIS — R0600 Dyspnea, unspecified: Secondary | ICD-10-CM

## 2015-05-26 LAB — PULMONARY FUNCTION TEST
DL/VA % pred: 111 %
DL/VA: 5.24 ml/min/mmHg/L
DLCO UNC: 20.88 ml/min/mmHg
DLCO unc % pred: 91 %
FEF 25-75 Pre: 1.29 L/sec
FEF2575-%Pred-Pre: 38 %
FEV1-%Pred-Pre: 62 %
FEV1-Pre: 1.73 L
FEV1FVC-%PRED-PRE: 69 %
FEV6-%Pred-Pre: 91 %
FEV6-PRE: 2.87 L
FEV6FVC-%Pred-Pre: 101 %
FVC-%PRED-PRE: 91 %
FVC-PRE: 2.87 L
PRE FEV6/FVC RATIO: 100 %
Pre FEV1/FVC ratio: 60 %
RV % pred: 101 %
RV: 1.24 L
TLC % PRED: 84 %
TLC: 4.14 L

## 2015-05-27 NOTE — Telephone Encounter (Signed)
[facility] cardiology at [phone] and spoke to Clover. Synetta Fail sent a note to Dr. Alfonso Ellis asking about CPST. Will await return call.

## 2015-05-29 ENCOUNTER — Telehealth: Payer: Self-pay | Admitting: Internal Medicine

## 2015-05-29 DIAGNOSIS — J399 Disease of upper respiratory tract, unspecified: Secondary | ICD-10-CM

## 2015-05-29 DIAGNOSIS — J989 Respiratory disorder, unspecified: Secondary | ICD-10-CM

## 2015-05-29 NOTE — Telephone Encounter (Signed)
Desiree Gutierrez  I know you are waiting for the [facility] EP doc to call back. 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 REfer her to ENT please with copy of my note, pft and this phone note  Meanwhile I have reached out to our EP docs to see if we can do CPST safely on her  Thanks  Dr. Kalman Shan, M.D., [facility].C.P Pulmonary and Critical Care Medicine Staff Physician [facility] Pager: [phone], If no answer or between  15:00h - 7:00h: call [phone]  05/29/2015 2:41 PM

## 2015-06-02 ENCOUNTER — Other Ambulatory Visit: Payer: Self-pay | Admitting: Family Medicine

## 2015-06-02 ENCOUNTER — Encounter: Payer: Self-pay | Admitting: Family Medicine

## 2015-06-02 DIAGNOSIS — J383 Other diseases of vocal cords: Secondary | ICD-10-CM

## 2015-06-02 NOTE — Telephone Encounter (Signed)
Called and spoke to pt. Informed her of the results and recs per MR. Referral made. Pt verbalized understanding and denied any further questions or concerns at this time.   PCC's - when sending referral, please send the OV note, PFTs, and this phone note. Thanks.

## 2015-06-02 NOTE — Telephone Encounter (Signed)
[facility] cardiology and was placed on long hold, will call back.

## 2015-06-02 NOTE — Telephone Encounter (Signed)
Due to patient's insurance, the Referral has to be set up by pt's PCP, Dr. J. FunBaldomero LamyI have contacted the patient and advised her that office will contact her with an appt and if she does not hear from them within a few days to f/u with them.  I have faxed the Referral, OV note and tjhis phone note to Dr. Armen Pickup office.  However, per Bennie Dallas with [facility], we are unable to fax the PFT since it was not performed in our office.

## 2015-06-05 NOTE — Telephone Encounter (Signed)
PFT send to Dr. Armen PickupFunches office. Nothing further needed at this time.

## 2015-06-09 NOTE — Telephone Encounter (Signed)
Order placed. PCC's please advise when CPST has been scheduled so OV can be made after CPST. Thanks.

## 2015-06-09 NOTE — Telephone Encounter (Signed)
Ok pls order CPST on bike with EIB challenge and have her come back fo rfu

## 2015-06-09 NOTE — Addendum Note (Signed)
Addended by: Sheran LuzEAST, Gavan Nordby K on: 06/09/2015 03:02 PM   Modules accepted: Orders

## 2015-06-09 NOTE — Telephone Encounter (Signed)
Call[facility] and [facility]. Inquired about the safety of pt doing CPST - per Dr. Alfonso EllisBeaty it is safe for pt to undergo CPST, a letter will be faxed to our office stating the same.   Will send to MR as FYI.

## 2015-06-11 ENCOUNTER — Encounter: Payer: Self-pay | Admitting: Family Medicine

## 2015-06-11 ENCOUNTER — Ambulatory Visit: Payer: Medicaid Other | Attending: Family Medicine | Admitting: Family Medicine

## 2015-06-11 VITALS — BP 101/67 | HR 89 | Temp 98.5°F | Wt 141.6 lb

## 2015-06-11 DIAGNOSIS — R110 Nausea: Secondary | ICD-10-CM

## 2015-06-11 MED ORDER — GI COCKTAIL ~~LOC~~: 30.0000 mL | Freq: Once | ORAL | Status: DC

## 2015-06-11 NOTE — Patient Instructions (Addendum)
Desiree Gutierrez was seen today for follow-up and nausea.  Diagnoses and all orders for this visit:  Nausea -     gi cocktail (Maalox,Lidocaine,Donnatal); Take 30 mLs by mouth once. Good options for nausea Ginger  Vitamin B6 along with unisom (doxylamine)    Call if ENT does not call you in 1 week  F/u in 4 weeks for pap smear  Dr. Armen PickupFunches

## 2015-06-11 NOTE — Assessment & Plan Note (Signed)
A; chronic nausea P: Ginger Vit B6 with unisom

## 2015-06-11 NOTE — Progress Notes (Signed)
Subjective:  Patient ID: Desiree Gutierrez, female    DOB: 09/15/1990  Age: 25 y.o. MRN: [id number]  CC: Follow-up   HPI Desiree Gutierrez presents for f/u POTs syndrome, hx of WPW s/p ablation, recurrent syncope    1. Nausea: she is nauseated all the time. No emesis. Nausea with L upper chest pain. Ginger tea helps. She has tried zofran with temporary and partial relief of symptoms. No fever or chills. She has referred to ENT for evaluation of vocal cord dysfunction. She has seen endo who had ruled out pheochromocytoma, and is considering extended blood glucose monitoring with an implantable device but does not really feel this is necessary.   Social History  Substance Use Topics  . Smoking status: Never Smoker   . Smokeless tobacco: Never Used  . Alcohol Use: No    Outpatient Prescriptions Prior to Visit  Medication Sig Dispense Refill  . Calcium-Magnesium-Vitamin D (CALCIUM MAGNESIUM PO) Take 2 tablets by mouth daily.    . Cholecalciferol (D-5000) 5000 UNITS TABS Take 5,000 Units by mouth daily.    . Coenzyme Q10 (COQ10 PO) Take 1 tablet by mouth daily.    . Cyanocobalamin (RA VITAMIN B-12 TR) 1000 MCG TBCR Take 1,000 mcg by mouth daily.    . ferrous gluconate (FERGON) 324 MG tablet Take 324 mg by mouth daily with breakfast.    . Omega-3 1000 MG CAPS Take 1 capsule by mouth daily.    . Probiotic Product (PROBIOTIC PO) Take 1 tablet by mouth daily.    . sodium chloride 1 G tablet Take 1 g by mouth 2 (two) times daily.    . ranitidine (ZANTAC) 150 MG tablet Take 1 tablet (150 mg total) by mouth 2 (two) times daily. (Patient not taking: Reported on 06/11/2015) 60 tablet 0   No facility-administered medications prior to visit.    ROS Review of Systems  Constitutional: Positive for appetite change (always feels hungry and never satisfied ) and fatigue. Negative for fever and chills.  HENT: Positive for dental problem.   Eyes: Negative for visual disturbance.  Respiratory: Positive for  shortness of breath.   Cardiovascular: Positive for chest pain and palpitations.  Gastrointestinal: Positive for nausea. Negative for vomiting, abdominal pain and blood in stool.  Musculoskeletal: Negative for back pain and arthralgias.  Skin: Negative for rash.  Allergic/Immunologic: Negative for immunocompromised state.  Neurological: Positive for syncope (last episode two weeks ago ).  Hematological: Negative for adenopathy. Does not bruise/bleed easily.  Psychiatric/Behavioral: Negative for suicidal ideas and dysphoric mood.    Objective:  BP 101/67 mmHg  Pulse 89  Temp(Src) 98.5 F (36.9 C) (Oral)  Wt 141 lb 9.6 oz (64.229 kg)  LMP 05/28/2015  BP/Weight 06/11/2015 05/22/2015 05/21/2015  Systolic BP 101 102 114  Diastolic BP 67 64 70  Wt. (Lbs) 141.6 140 141.4  BMI 25.09 24.81 25.05   Physical Exam  Constitutional: She is oriented to person, place, and time. She appears well-developed and well-nourished. No distress.  HENT:  Head: Normocephalic and atraumatic.  Cardiovascular: Normal rate, regular rhythm, normal heart sounds and intact distal pulses.   Pulmonary/Chest: Effort normal and breath sounds normal.  Musculoskeletal: She exhibits no edema or tenderness.  Neurological: She is alert and oriented to person, place, and time.  Skin: Skin is warm and dry. No rash noted.  Psychiatric: She has a normal mood and affect.   Lab Results  Component Value Date   HGBA1C 5.20 11/06/2014   CBG 111  No  improvement of CP or nausea  with GI cocktail  Assessment & Plan:   Desiree Gutierrez was seen today for follow-up and nausea.  Diagnoses and all orders for this visit:  Nausea -     gi cocktail (Maalox,Lidocaine,Donnatal); Take 30 mLs by mouth once.  Follow-up: No Follow-up on file.   [name] MD

## 2015-06-13 ENCOUNTER — Emergency Department (HOSPITAL_COMMUNITY)
Admission: EM | Admit: 2015-06-13 | Discharge: 2015-06-13 | Disposition: A | Discharge status: Home / Self Care | Payer: Medicaid Other | Attending: Emergency Medicine | Admitting: Emergency Medicine

## 2015-06-13 ENCOUNTER — Encounter (HOSPITAL_COMMUNITY): Payer: Self-pay | Admitting: Emergency Medicine

## 2015-06-13 DIAGNOSIS — Y998 Other external cause status: Secondary | ICD-10-CM | POA: Diagnosis not present

## 2015-06-13 DIAGNOSIS — W1839XA Other fall on same level, initial encounter: Secondary | ICD-10-CM | POA: Insufficient documentation

## 2015-06-13 DIAGNOSIS — Z043 Encounter for examination and observation following other accident: Secondary | ICD-10-CM | POA: Insufficient documentation

## 2015-06-13 DIAGNOSIS — Y9289 Other specified places as the place of occurrence of the external cause: Secondary | ICD-10-CM | POA: Insufficient documentation

## 2015-06-13 DIAGNOSIS — Z8679 Personal history of other diseases of the circulatory system: Secondary | ICD-10-CM | POA: Insufficient documentation

## 2015-06-13 DIAGNOSIS — R55 Syncope and collapse: Secondary | ICD-10-CM

## 2015-06-13 DIAGNOSIS — Z79899 Other long term (current) drug therapy: Secondary | ICD-10-CM | POA: Insufficient documentation

## 2015-06-13 DIAGNOSIS — R531 Weakness: Secondary | ICD-10-CM | POA: Diagnosis present

## 2015-06-13 DIAGNOSIS — R002 Palpitations: Secondary | ICD-10-CM | POA: Insufficient documentation

## 2015-06-13 DIAGNOSIS — Y9389 Activity, other specified: Secondary | ICD-10-CM | POA: Insufficient documentation

## 2015-06-13 MED ORDER — GI COCKTAIL ~~LOC~~
30.0000 mL | Freq: Once | ORAL | Status: AC
  Administered 2015-06-13: 30 mL via ORAL
  Filled 2015-06-13: qty 30

## 2015-06-13 NOTE — ED Notes (Signed)
Pt called ems today for period of syncopy and felt her heart was racing , has hx of  POTS and WPW, fell face first down on ground ,witnessed by her mother, denies head neck face or chest pain saw her pcp on Thursday and dx with URI was told dhe needed to see  An ENT dr. LMP was 2/23. Pt has  Care plan

## 2015-06-13 NOTE — Discharge Instructions (Signed)
Syncope Follow up with your primary care physician.  Syncope is a medical term for fainting or passing out. This means you lose consciousness and drop to the ground. People are generally unconscious for less than 5 minutes. You may have some muscle twitches for up to 15 seconds before waking up and returning to normal. Syncope occurs more often in older adults, but it can happen to anyone. While most causes of syncope are not dangerous, syncope can be a sign of a serious medical problem. It is important to seek medical care.  CAUSES  Syncope is caused by a sudden drop in blood flow to the brain. The specific cause is often not determined. Factors that can bring on syncope include:  Taking medicines that lower blood pressure.  Sudden changes in posture, such as standing up quickly.  Taking more medicine than prescribed.  Standing in one place for too long.  Seizure disorders.  Dehydration and excessive exposure to heat.  Low blood sugar (hypoglycemia).  Straining to have a bowel movement.  Heart disease, irregular heartbeat, or other circulatory problems.  Fear, emotional distress, seeing blood, or severe pain. SYMPTOMS  Right before fainting, you may:  Feel dizzy or light-headed.  Feel nauseous.  See all white or all black in your field of vision.  Have cold, clammy skin. DIAGNOSIS  Your health care provider will ask about your symptoms, perform a physical exam, and perform an electrocardiogram (ECG) to record the electrical activity of your heart. Your health care provider may also perform other heart or blood tests to determine the cause of your syncope which may include:  Transthoracic echocardiogram (TTE). During echocardiography, sound waves are used to evaluate how blood flows through your heart.  Transesophageal echocardiogram (TEE).  Cardiac monitoring. This allows your health care provider to monitor your heart rate and rhythm in real time.  Holter monitor. This  is a portable device that records your heartbeat and can help diagnose heart arrhythmias. It allows your health care provider to track your heart activity for several days, if needed.  Stress tests by exercise or by giving medicine that makes the heart beat faster. TREATMENT  In most cases, no treatment is needed. Depending on the cause of your syncope, your health care provider may recommend changing or stopping some of your medicines. HOME CARE INSTRUCTIONS  Have someone stay with you until you feel stable.  Do not drive, use machinery, or play sports until your health care provider says it is okay.  Keep all follow-up appointments as directed by your health care provider.  Lie down right away if you start feeling like you might faint. Breathe deeply and steadily. Wait until all the symptoms have passed.  Drink enough fluids to keep your urine clear or pale yellow.  If you are taking blood pressure or heart medicine, get up slowly and take several minutes to sit and then stand. This can reduce dizziness. SEEK IMMEDIATE MEDICAL CARE IF:   You have a severe headache.  You have unusual pain in the chest, abdomen, or back.  You are bleeding from your mouth or rectum, or you have black or tarry stool.  You have an irregular or very fast heartbeat.  You have pain with breathing.  You have repeated fainting or seizure-like jerking during an episode.  You faint when sitting or lying down.  You have confusion.  You have trouble walking.  You have severe weakness.  You have vision problems. If you fainted, call your local  emergency services (911 in [place] Do not drive yourself to the hospital.    This information is not intended to replace advice given to you by your health care provider. Make sure you discuss any questions you have with your health care provider.   Document Released: 03/21/2005 Document Revised: 08/05/2014 Document Reviewed: 05/20/2011 Elsevier Interactive  Patient Education [employer]

## 2015-06-13 NOTE — ED Provider Notes (Signed)
CSN: [id number]     Arrival date & time 06/13/15  1155 History   First MD Initiated Contact with Patient 06/13/15 1204     Chief Complaint  Patient presents with  . Weakness     (Consider location/radiation/quality/duration/timing/severity/associated sxs/prior Treatment) Patient is a 25 y.o. female presenting with weakness. The history is provided by the patient and a parent. No language interpreter was used.  Weakness Associated symptoms include weakness.   Desiree Gutierrez Is a 25 year old female with a history of WPW, pots, vasovagal syncope who presents via EMS for syncope today and feeling that her heart was racing this morning. She fell face first down on the ground which was witnessed by her mom. She was turned over by the help of her mom's nurse and was able to stand. She denies any head, neck, face, or chest pain. She was seen by her PCP 2 days ago for nausea and was prescribed GI cocktail. She was also told that she needed to see an ENT specialist for vocal cord dysfunction. Her LMP was 05/28/2015. She reports being seen recently by pulmonology who referred her back to cardiology. Her symptoms have since resolved. She denies any shortness of breath, headache, dizziness, lightheadedness.  Past Medical History  Diagnosis Date  . WPW (Wolff-Parkinson-White syndrome)     a. s/p ablation (2011) [facility] (post[facility] pathway)  . Vasovagal syncope     a. positive tilt 2011 b. failed medical therapy with Florinef, Midodrine, Inderal, Celexa  . POTS (postural orthostatic tachycardia syndrome)   . Palpitations     a. s/p MDT ILR implanted by Dr Alfonso [facility]Beaty [facility]  . Headache    Past Surgical History  Procedure Laterality Date  . Cardiac electrophysiology study and ablation  2011    [facility]- left posterior pathway ablation for WPW  . Tilt table study  2011    neurally mediated syncope  . Loop recorder implant  01/22/2014    MDT LINQ [facility] for [facility] of palpitations/syncope   Family History  Problem Relation Age of Onset  . Diabetes Mother   . Hypertension Mother   . Stroke Mother   . Hypertension Brother   . Seizures Neg Hx    Social History  Substance Use Topics  . Smoking status: Never Smoker   . Smokeless tobacco: Never Used  . Alcohol Use: No   OB History    No data available     Review of Systems  Cardiovascular: Positive for palpitations.  Neurological: Positive for weakness.  All other systems reviewed and are negative.     Allergies  Amitiza; Other; Ketorolac; Metoclopramide; Midodrine; and Fludrocortisone  Home Medications   Prior to Admission medications   Medication Sig Start Date End Date Taking? Authorizing Provider  Calcium-Magnesium-Vitamin D (CALCIUM MAGNESIUM PO) Take 2 tablets by mouth daily.    Historical Provider, MD  Cholecalciferol (D-5000) 5000 UNITS TABS Take 5,000 Units by mouth daily.    Historical Provider, MD  Coenzyme Q10 (COQ10 PO) Take 1 tablet by mouth daily.    Historical Provider, MD  Cyanocobalamin (RA VITAMIN B-12 TR) 1000 MCG TBCR Take 1,000 mcg by mouth daily.    Historical Provider, MD  ferrous gluconate (FERGON) 324 MG tablet Take 324 mg by mouth daily with breakfast.    Historical Provider, MD  Omega-3 1000 MG CAPS Take 1 capsule by mouth daily.    Historical Provider, MD  Probiotic Product (PROBIOTIC PO) Take  1 tablet by mouth daily.    Historical Provider, MD  ranitidine (ZANTAC) 150 MG tablet Take 1 tablet (150 mg total) by mouth 2 (two) times daily. Patient not taking: Reported on 06/11/2015 05/22/15   [name], MD  sodium chloride 1 G tablet Take 1 g by mouth 2 (two) times daily.    Historical Provider, MD   BP 100/65 mmHg  Pulse 88  Temp(Src) 98.6 F (37 C)  Resp 12  SpO2 100%  LMP 05/28/2015 Physical Exam  Constitutional: She is oriented to person, place, and time. She appears well-developed and well-nourished.  HENT:  Head:  Normocephalic.  No facial laceration or abrasion. No missing teeth. No bleeding inside the mouth or gums. No tongue lac. No contusion or hematoma.  Eyes: Conjunctivae are normal.  Neck: Normal range of motion. Neck supple.  Cardiovascular: Normal rate, regular rhythm and normal heart sounds.   Regular rate and rhythm. No murmur.  Lungs: Clear to auscultation bilaterally.  Pulmonary/Chest: Effort normal and breath sounds normal. No respiratory distress. She has no wheezes. She has no rales.  Abdominal: Soft. There is no tenderness.  Musculoskeletal: Normal range of motion.  Neurological: She is alert and oriented to person, place, and time.  GCS 15. Cranial nerves III through XII intact. No sensory or motor deficit. Normal coordination.  Skin: Skin is warm and dry.  Nursing note and vitals reviewed.   ED Course  Procedures (including critical care time) Labs Review Labs Reviewed - No data to display  Imaging Review No results found.   EKG Interpretation   Date/Time:  Saturday June 13 2015 12:15:20 EST Ventricular Rate:  88 PR Interval:  126 QRS Duration: 88 QT Interval:  362 QTC Calculation: 438 R Axis:   84 Text Interpretation:  Normal sinus rhythm with sinus arrhythmia Normal ECG  Confirmed by MESNER MD, Barbara Cower [phone]) on 06/13/2015 1:01:58 PM      MDM   Final diagnoses:  Syncope, unspecified syncope type   Patient presents for brief syncopal episode in which she fell on her face. No signs of trauma to the head or face. She is not on anticoagulation dictation. She denies any nausea or vomiting. She is well-appearing and in no acute distress. Her vitals are stable. She is followed closely by both her PCP and cardiologist. She was seen 2 days ago by her PCP.  She has a care plan. EKG looks normal and without WPW. I do not believe the patient needs further imaging or workup. I discussed return precautions with patient as well as follow-up with PCP. Patient agrees with  plan.  Filed Vitals:   06/13/15 1201  BP: 100/65  Pulse: 88  Temp: 98.6 F (37 C)  Resp: [id number] PA-C 06/13/15 1516  Marily Memos, MD 06/14/15 [phone]

## 2015-06-22 ENCOUNTER — Ambulatory Visit (HOSPITAL_COMMUNITY): Payer: Medicaid Other | Attending: Internal Medicine

## 2015-06-22 DIAGNOSIS — R0602 Shortness of breath: Secondary | ICD-10-CM | POA: Insufficient documentation

## 2015-06-29 DIAGNOSIS — R0600 Dyspnea, unspecified: Secondary | ICD-10-CM | POA: Diagnosis not present

## 2015-06-29 NOTE — Progress Notes (Signed)
Quick Note:  Called and spoke to pt. Informed her of the results and recs per MR. Appt made with TP on 3/30. Pt verbalized understanding and denied any further questions or concerns at this time.   ______

## 2015-07-01 NOTE — Progress Notes (Signed)
Quick Note:  CPST sent to PCP, Dr. Alfonso EllisBeaty [facility] cards), Dr. Jenne PaneBates (ENT), and Dr. Kerby NoraBennion (endocrinology). ______

## 2015-07-02 ENCOUNTER — Ambulatory Visit (INDEPENDENT_AMBULATORY_CARE_PROVIDER_SITE_OTHER): Payer: Medicaid Other | Admitting: Adult Health

## 2015-07-02 ENCOUNTER — Encounter: Payer: Self-pay | Admitting: Adult Health

## 2015-07-02 VITALS — BP 118/70 | HR 83 | Temp 98.2°F | Ht 63.0 in | Wt 142.0 lb

## 2015-07-02 DIAGNOSIS — R0602 Shortness of breath: Secondary | ICD-10-CM

## 2015-07-02 DIAGNOSIS — G90A Postural orthostatic tachycardia syndrome (POTS): Secondary | ICD-10-CM

## 2015-07-02 DIAGNOSIS — R000 Tachycardia, unspecified: Secondary | ICD-10-CM

## 2015-07-02 DIAGNOSIS — I951 Orthostatic hypotension: Secondary | ICD-10-CM | POA: Diagnosis not present

## 2015-07-02 MED ORDER — BECLOMETHASONE DIPROPIONATE 80 MCG/ACT IN AERS: 2.0000 | INHALATION_SPRAY | Freq: Two times a day (BID) | RESPIRATORY_TRACT | Status: DC

## 2015-07-02 NOTE — Patient Instructions (Signed)
Trial of QVAR 2 puffs Twice daily  .  [facility] to see [facility]iology [facility] next month as planned follow up Dr. Marchelle Gearingamaswamy in 2 months and As needed

## 2015-07-07 NOTE — Assessment & Plan Note (Signed)
CPST with no clear ventilatory limitation noted.  Refer back to cardiology as pt has ongoing  Syncopal episodes.

## 2015-07-07 NOTE — Progress Notes (Signed)
Subjective:    Patient ID: Desiree Gutierrez, female    DOB: [birth year], 25 y.o.   MRN: [id number]  Desiree Ends, MD -  PCP     HPI  IOV 05/21/2015  Chief Complaint  Patient presents with  . Pulmonary Consult    Pt referred by Dr. Adrian Blackwater for SOB x 3 months. Pt states the SOB occurs with activty and rest. Pt c/o non prod cough with chest congestion and midsternal chest tightness when SOB or coughing - pain radiates to mid back.      25 year old female self-referred for shortness of breath. History is given by her and by review of chart from [facility] and color on emergency room records.   She reports a diagnosis of Wolff-Parkinson-White syndrome for the last 6 years and is status post ablation in 2011 but despite that has had arrhythmia issues. According to her history she has tried several antiarrhythmics but has had intolerance issues with all of them. She follows with Dr. Yvonna Alanis at [facility] a little physiology department. She reports that for the last 3 months she's had insidious onset of shortness of breath that is severe. These happen at rest and is episodic and also with exertion. They're relieved by rest. It is associated with tachycardia and also bradycardia that she feels subjectively. She says that she has had 12 emergency room visits in the last 6 months all  Related to shortness of breath and apparently she's been hypoxemic when EMS picks her up. When I review the emergency room records it appears that she probably has only one ER visit a month. A lot of this is due to syncope. I could not find any evidence of hypoxemia documented. Although she did have November 2016 seen CT angiogram chest that ruled out pulmonary emboli and she had clear lung fields. I personally visualized this image.   other lab work for May 12 2015 include normal electrolytes and creatinine. Normal liver function tests and a normal hemoglobin of 12.4 g percent.    today in the  office we did exhaled nitric oxide for asthma and it is normal less than 5 ppb. We also walked her 185 feet 3 laps on room air and she did not desaturate or get tachycardic.   problem list from [facility] Syncope - Vasovagal - failed fludricortisone, midodrine, inderal, citalopram - Ziopatch - 7/30-8/12/15: Min HR 57 max 153 bpm, Ave 91 pbm. Predominant 91 bpm, symptoms occurred during sinus rhythm - multiple ED visits In 2015 for recurrenc syncope - Last episode 03/10/2015 treated at [facility] ED per patient (see notes in [place]), had episode of symptomatic hyoglycemia in clinic today  Possilble POTS - reportedly positive tilt table test in distant past but negative tilt table here more recently Palpitations - h/o WPW s/p left posterior pathway ablation by Dr. 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  -  Echocardiogram 03/15/2015: Left ventricle ejection fraction is normal. Right ventricle is normal. Overall echo was reported as normal.   04/17/2015: Underwent EP study at [facility] by Dr. Amedeo Gory. According to the patient there was a foci that could not be ablated but according to my review of the notes of the procedure it was a  negative EP study with 4 hours flat time. Notes indicate that tachycardia could not be induced with aggressive straight pacing an extra stimuli.   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  But there were plans to initiate Ivabradine  For which she could not afford   other lab workup 08/16/2011 -  Autoimmune lab work including anti-neuronal nuclear antibody , acetal colon receptor binding antibody an other antibodies are negative. But I do not  see evidence of her having had autoimmune collagen vascular disease workup  07/02/15 Follow up : SOB  Pt returns for 1 month follow up . Recently seen last month for pulmonary consult for dyspnea.  She has a history of WPW s/p ablation in 2011. She complains over last 3-4 months of sob with activity .  Pt was set up for a CPST  mild functional impairment when compared to matched sedentary norms. There was mild chronotropic incompetence in addition to poor measured PVO2 and low, with early flattening O2/pulse. There are no clear ventilatory limtiation, however there is mild evidence of circulatory limitations which could be due to diastolic dysfunction in combination with arrhythmia and suspected POTS. In addition, patient is likely poorly conditioned due to persistent syncope. .  Pre spirometry was normal lung function. W/ FVC 104%, FEV1 109%, ratio 104 Went to ER again with syncopal episode 06/13/15 . EKG showed NSR w/ nad. Has recurrent episodes of presyncope, syncope.  discussed her results with pt and her dad.  Case reviewed with Dr. Marchelle Gearingamaswamy and pt needs to see Cardiology back to see if cardiac source.  She denies chest pain. Orthopnea, edema or fever.      Past Medical History  Diagnosis Date  . WPW (Wolff-Parkinson-White syndrome)     a. s/p ablation (2011) Dr Gevena CottonZimmern [facility] (posterior lateral pathway)  . Vasovagal syncope     a. positive tilt 2011 b. failed medical therapy with Florinef, Midodrine, Inderal, Celexa  . POTS (postural orthostatic tachycardia syndrome)   . Palpitations     a. s/p MDT ILR implanted by Dr Alfonso EllisBeaty [facility]  . Headache    Current Outpatient Prescriptions on File Prior to Visit  Medication Sig Dispense Refill  . Calcium-Magnesium-Vitamin D (CALCIUM MAGNESIUM PO) Take 2 tablets by mouth daily.    . Cholecalciferol (D-5000) 5000 UNITS TABS Take 5,000 Units by mouth daily.    . Coenzyme Q10 (COQ10 PO) Take 1 tablet by mouth daily.    . Cyanocobalamin (RA  VITAMIN B-12 TR) 1000 MCG TBCR Take 1,000 mcg by mouth daily.    . ferrous gluconate (FERGON) 324 MG tablet Take 324 mg by mouth daily with breakfast.    . Omega-3 1000 MG CAPS Take 1 capsule by mouth daily.    . Probiotic Product (PROBIOTIC PO) Take 1 tablet by mouth daily.    . sodium chloride 1 G tablet Take 1 g by mouth 2 (two) times daily.    . ranitidine (ZANTAC) 150 MG tablet Take 1 tablet (150 mg total) by mouth 2 (two) times daily. (Patient not taking: Reported on 07/02/2015) 60 tablet 0   Current Facility-Administered Medications on File Prior to Visit  Medication Dose Route Frequency Provider Last Rate Last Dose  . gi cocktail (Maalox,Lidocaine,Donnatal)  30 mL Oral Once Dessa PhiJosalyn Funches, MD              Review of Systems  Constitutional: Negative for fever and unexpected weight change.  HENT: Negative for congestion,  dental problem, ear pain, nosebleeds, postnasal drip, rhinorrhea, sinus pressure, sneezing, sore throat and trouble swallowing.   Eyes: Negative for redness and itching.  Respiratory: Positive for cough and shortness of breath. Negative for chest tightness and wheezing.   Cardiovascular: Negative for palpitations and leg swelling.  Gastrointestinal:   Negative for vomiting.  Genitourinary: Negative for dysuria.  Musculoskeletal: Negative for joint swelling.  Skin: Negative for rash.  Neurological: Negative for headaches.  Hematological: Does not bruise/bleed easily.  Psychiatric/Behavioral: Negative for dysphoric mood. The patient is not nervous/anxious.        Objective:   Physical Exam  Filed Vitals:   07/02/15 1525  BP: 118/70  Pulse: 83  Temp: 98.2 F (36.8 C)  TempSrc: Oral  Height:  (1.6 m)  Weight: 142 lb (64.411 kg)  SpO2: 100%   GEN: A/Ox3; pleasant , NAD, well nourished   HEENT:  [place]/AT,  EACs-clear, TMs-wnl, NOSE-clear, THROAT-clear, no lesions, no postnasal drip or exudate noted.   NECK:  Supple w/ fair ROM; no JVD; normal  carotid impulses w/o bruits; no thyromegaly or nodules palpated; no lymphadenopathy.  RESP  Clear  P & A; w/o, wheezes/ rales/ or rhonchi.no accessory muscle use, no dullness to percussion  CARD:  RRR, no m/r/g  , no peripheral edema, pulses intact, no cyanosis or clubbing.  GI:   Soft & nt; nml bowel sounds; no organomegaly or masses detected.  Musco: Warm bil, no deformities or joint swelling noted.   Neuro: alert, no focal deficits noted.    Skin: Warm, no lesions or rashes  Desiree Windhorst NP-C  [facility]  07/02/15

## 2015-07-07 NOTE — Assessment & Plan Note (Signed)
?   Etiology , possible deconditioning  Contributing factor.  Will refer back to Cardiology  Pre CPST spirometry was normal .  ? RAD , will try ICS to see if helps with dyspnea.   Plan  Trial of QVAR 2 puffs Twice daily  .  [facility] as planned follow up Dr. Marchelle Gearingamaswamy in 2 months and As needed

## 2015-07-20 ENCOUNTER — Telehealth: Payer: Self-pay | Admitting: Family Medicine

## 2015-07-20 ENCOUNTER — Other Ambulatory Visit: Payer: Medicaid Other | Admitting: Family Medicine

## 2015-07-20 NOTE — Telephone Encounter (Signed)
Brandy from [facility] called to let pt. PCP know that pt. Was referred by her PCP to behavioral health and pt. Is interesting in getting a support group list specifically for caregivers. Please f/u with pt.

## 2015-07-21 NOTE — Telephone Encounter (Signed)
Asher MuirJamie,  Do you have a list of support groups that you can mail to the patient? May support group for young people caring for their parents or support group for people dealing with chronic disease.

## 2015-07-22 ENCOUNTER — Ambulatory Visit: Payer: Medicaid Other | Attending: Family Medicine | Admitting: Family Medicine

## 2015-07-22 ENCOUNTER — Other Ambulatory Visit (HOSPITAL_COMMUNITY)
Admission: RE | Admit: 2015-07-22 | Discharge: 2015-07-22 | Disposition: A | Discharge status: Home / Self Care | Payer: Medicaid Other | Source: Ambulatory Visit | Attending: Family Medicine | Admitting: Family Medicine

## 2015-07-22 ENCOUNTER — Encounter: Payer: Self-pay | Admitting: Family Medicine

## 2015-07-22 VITALS — BP 103/60 | HR 88 | Temp 99.0°F | Resp 16 | Ht 63.0 in | Wt 142.0 lb

## 2015-07-22 DIAGNOSIS — Z79899 Other long term (current) drug therapy: Secondary | ICD-10-CM | POA: Insufficient documentation

## 2015-07-22 DIAGNOSIS — Z124 Encounter for screening for malignant neoplasm of cervix: Secondary | ICD-10-CM

## 2015-07-22 DIAGNOSIS — N898 Other specified noninflammatory disorders of vagina: Secondary | ICD-10-CM | POA: Insufficient documentation

## 2015-07-22 DIAGNOSIS — N760 Acute vaginitis: Secondary | ICD-10-CM

## 2015-07-22 DIAGNOSIS — Z01419 Encounter for gynecological examination (general) (routine) without abnormal findings: Secondary | ICD-10-CM | POA: Diagnosis present

## 2015-07-22 DIAGNOSIS — R000 Tachycardia, unspecified: Secondary | ICD-10-CM

## 2015-07-22 DIAGNOSIS — I456 Pre-excitation syndrome: Secondary | ICD-10-CM | POA: Diagnosis not present

## 2015-07-22 DIAGNOSIS — G90A Postural orthostatic tachycardia syndrome (POTS): Secondary | ICD-10-CM

## 2015-07-22 DIAGNOSIS — Z113 Encounter for screening for infections with a predominantly sexual mode of transmission: Secondary | ICD-10-CM | POA: Insufficient documentation

## 2015-07-22 DIAGNOSIS — R55 Syncope and collapse: Secondary | ICD-10-CM | POA: Insufficient documentation

## 2015-07-22 DIAGNOSIS — R079 Chest pain, unspecified: Secondary | ICD-10-CM | POA: Insufficient documentation

## 2015-07-22 DIAGNOSIS — T671XXS Heat syncope, sequela: Secondary | ICD-10-CM | POA: Diagnosis not present

## 2015-07-22 DIAGNOSIS — Z1151 Encounter for screening for human papillomavirus (HPV): Secondary | ICD-10-CM | POA: Diagnosis not present

## 2015-07-22 DIAGNOSIS — R0602 Shortness of breath: Secondary | ICD-10-CM | POA: Insufficient documentation

## 2015-07-22 DIAGNOSIS — I951 Orthostatic hypotension: Secondary | ICD-10-CM

## 2015-07-22 MED ORDER — FLUCONAZOLE 150 MG PO TABS: 150.0000 mg | ORAL_TABLET | ORAL | Status: DC

## 2015-07-22 MED FILL — FLUCONAZOLE 150 MG TABLET: 150 | 2 days supply | Qty: 2 | Fill #0

## 2015-07-22 NOTE — Assessment & Plan Note (Signed)
Vaginitis Possibly yeast Treat with diflucan

## 2015-07-22 NOTE — Assessment & Plan Note (Signed)
Placed cards referral to [facility]

## 2015-07-22 NOTE — Patient Instructions (Addendum)
Desiree Gutierrez was seen today for gynecologic exam.  Diagnoses and all orders for this visit:  Pap smear for cervical cancer screening -     Cytology - PAP  WOLFF-PARKINSON-WHITE (WPW) SYNDROME status post ablation -     Ambulatory referral to Cardiology  Heat syncope, sequela -     Ambulatory referral to Cardiology  POTS (postural orthostatic tachycardia syndrome) -     Ambulatory referral to Cardiology  Vaginitis -     fluconazole (DIFLUCAN) 150 MG tablet; Take 1 tablet (150 mg total) by mouth every 3 (three) days.   I have placed a referral to [facility], cardiology  Your pap results will be sent to your mychart  F/u in 3 months, sooner if needed   Dr. Armen PickupFunches   Bacterial Vaginosis- just for reference Bacterial vaginosis is a vaginal infection that occurs when the normal balance of bacteria in the vagina is disrupted. It results from an overgrowth of certain bacteria. This is the most common vaginal infection in women of childbearing age. Treatment is important to prevent complications, especially in pregnant women, as it can cause a premature delivery. CAUSES  Bacterial vaginosis is caused by an increase in harmful bacteria that are normally present in smaller amounts in the vagina. Several different kinds of bacteria can cause bacterial vaginosis. However, the reason that the condition develops is not fully understood. RISK FACTORS Certain activities or behaviors can put you at an increased risk of developing bacterial vaginosis, including:  Having a new sex partner or multiple sex partners.  Douching.  Using an intrauterine device (IUD) for contraception. Women do not get bacterial vaginosis from toilet seats, bedding, swimming pools, or contact with objects around them. SIGNS AND SYMPTOMS  Some women with bacterial vaginosis have no signs or symptoms. Common symptoms include:  Grey vaginal discharge.  A fishlike odor with discharge, especially after sexual  intercourse.  Itching or burning of the vagina and vulva.  Burning or pain with urination. DIAGNOSIS  Your health care provider will take a medical history and examine the vagina for signs of bacterial vaginosis. A sample of vaginal fluid may be taken. Your health care provider will look at this sample under a microscope to check for bacteria and abnormal cells. A vaginal pH test may also be done.  TREATMENT  Bacterial vaginosis may be treated with antibiotic medicines. These may be given in the form of a pill or a vaginal cream. A second round of antibiotics may be prescribed if the condition comes back after treatment. Because bacterial vaginosis increases your risk for sexually transmitted diseases, getting treated can help reduce your risk for chlamydia, gonorrhea, HIV, and herpes. HOME CARE INSTRUCTIONS   Only take over-the-counter or prescription medicines as directed by your health care provider.  If antibiotic medicine was prescribed, take it as directed. Make sure you finish it even if you start to feel better.  Tell all sexual partners that you have a vaginal infection. They should see their health care provider and be treated if they have problems, such as a mild rash or itching.  During treatment, it is important that you follow these instructions:  Avoid sexual activity or use condoms correctly.  Do not douche.  Avoid alcohol as directed by your health care provider.  Avoid breastfeeding as directed by your health care provider. SEEK MEDICAL CARE IF:   Your symptoms are not improving after 3 days of treatment.  You have increased discharge or pain.  You have a fever.  MAKE SURE YOU:   Understand these instructions.  Will watch your condition.  Will get help right away if you are not doing well or get worse. FOR MORE INFORMATION  Centers for Disease Control and Prevention, Division of STD Prevention: [employer].za American Sexual Health Association (ASHA):  www.ashastd.org    This information is not intended to replace advice given to you by your health care provider. Make sure you discuss any questions you have with your health care provider.   Document Released: 03/21/2005 Document Revised: 04/11/2014 Document Reviewed: 10/31/2012 Elsevier Interactive Patient Education [employer]

## 2015-07-22 NOTE — Progress Notes (Signed)
Annual pap smear  vaginal discharge- white color no odor  Vaginal irritation, itching  No sexually active No pain today  No tobacco user  No suicidal thoughts in the past two weeks

## 2015-07-22 NOTE — Progress Notes (Signed)
Subjective:  Patient ID: Desiree Gutierrez, female    DOB: 03/08/1991  Age: 25 y.o. MRN: [id number]  CC: Gynecologic Exam   HPI [name] presents for   1. Gynecological exam: here for screening pap. Reports vaginal discharge that is white with no odor. No itching or lesions. She has never been sexually active.   2. WPW/syncope/POTs: last episode of chest pain with sweating and SOB occurred during her follow up appt with Dr. Alfonso EllisBeaty on 07/09/2015. She has been referred to [facility]. She request that I place a referral as well. She denies CP, dizziness, lightheadedness today.   Social History  Substance Use Topics  . Smoking status: Never Smoker   . Smokeless tobacco: Never Used  . Alcohol Use: No    Outpatient Prescriptions Prior to Visit  Medication Sig Dispense Refill  . beclomethasone (QVAR) 80 MCG/ACT inhaler Inhale 2 puffs into the lungs 2 (two) times daily. 1 Inhaler 6  . Calcium-Magnesium-Vitamin D (CALCIUM MAGNESIUM PO) Take 2 tablets by mouth daily.    . Cholecalciferol (D-5000) 5000 UNITS TABS Take 5,000 Units by mouth daily.    . Coenzyme Q10 (COQ10 PO) Take 1 tablet by mouth daily.    . Cyanocobalamin (RA VITAMIN B-12 TR) 1000 MCG TBCR Take 1,000 mcg by mouth daily.    . ferrous gluconate (FERGON) 324 MG tablet Take 324 mg by mouth daily with breakfast.    . Omega-3 1000 MG CAPS Take 1 capsule by mouth daily.    . Probiotic Product (PROBIOTIC PO) Take 1 tablet by mouth daily.    . ranitidine (ZANTAC) 150 MG tablet Take 1 tablet (150 mg total) by mouth 2 (two) times daily. 60 tablet 0  . sodium chloride 1 G tablet Take 1 g by mouth 2 (two) times daily.    . beclomethasone (QVAR) 80 MCG/ACT inhaler Inhale 2 puffs into the lungs 2 (two) times daily. 1 Inhaler 0   Facility-Administered Medications Prior to Visit  Medication Dose Route Frequency Provider Last Rate Last Dose  . gi cocktail (Maalox,Lidocaine,Donnatal)  30 mL Oral Once Camdin Hegner, MD         ROS Review of Systems  Constitutional: Positive for appetite change (always feels hungry and never satisfied ). Negative for fever, chills and fatigue.  Eyes: Negative for visual disturbance.  Respiratory: Negative for shortness of breath.   Cardiovascular: Positive for chest pain and palpitations.  Gastrointestinal: Positive for nausea. Negative for vomiting, abdominal pain and blood in stool.  Genitourinary: Positive for vaginal discharge.  Musculoskeletal: Negative for back pain and arthralgias.  Skin: Negative for rash.  Allergic/Immunologic: Negative for immunocompromised state.  Neurological: Positive for syncope (last episode two weeks ago ).  Hematological: Negative for adenopathy. Does not bruise/bleed easily.  Psychiatric/Behavioral: Negative for suicidal ideas and dysphoric mood.    Objective:  BP 103/60 mmHg  Pulse 88  Temp(Src) 99 F (37.2 C) (Oral)  Resp 16  Ht 5\' 3"  (1.6 m)  Wt 142 lb (64.411 kg)  BMI 25.16 kg/m2  SpO2 99%  LMP 06/29/2015  BP/Weight 07/22/2015 07/02/2015 06/13/2015  Systolic BP 103 118 100  Diastolic BP 60 70 65  Wt. (Lbs) 142 142 -  BMI 25.16 25.16 -   Physical Exam  Constitutional: She is oriented to person, place, and time. She appears well-developed and well-nourished. No distress.  HENT:  Head: Normocephalic and atraumatic.  Cardiovascular: Normal rate, regular rhythm, normal heart sounds and intact distal pulses.   Pulmonary/Chest: Effort normal  and breath sounds normal.  Genitourinary: Cervix exhibits discharge (thin-white homogenous ). Cervix exhibits no motion tenderness and no friability. Right adnexum displays no mass, no tenderness and no fullness. Left adnexum displays no mass, no tenderness and no fullness. No erythema, tenderness or bleeding in the vagina. No foreign body around the vagina. No signs of injury around the vagina. Vaginal discharge found.  Musculoskeletal: She exhibits no edema.  Neurological: She is alert and  oriented to person, place, and time.  Skin: Skin is warm and dry. No rash noted.  Psychiatric: She has a normal mood and affect.     Assessment & Plan:   There are no diagnoses linked to this encounter.  No orders of the defined types were placed in this encounter.    Follow-up: No Follow-up on file.   [name] MD

## 2015-07-23 LAB — CYTOLOGY - PAP

## 2015-07-23 LAB — CERVICOVAGINAL ANCILLARY ONLY
CHLAMYDIA, DNA PROBE: NEGATIVE
NEISSERIA GONORRHEA: NEGATIVE
WET PREP (BD AFFIRM): NEGATIVE

## 2015-07-24 ENCOUNTER — Encounter: Payer: Self-pay | Admitting: Clinical

## 2015-07-24 NOTE — Progress Notes (Signed)
Dep[facility] screen [facility] 07/22/2015 05/22/2015 04/30/2015 03/17/2015 02/02/2015  Decreased Interest 0 0 0 0 0  Down, Depressed, Hopeless 0 0 0 0 0  PHQ - 2 Score 0 0 0 0 0  Altered sleeping 0 1 3 - -  Tired, decreased energy 2 3 3  - -  Change in appetite 0 0 0 - -  Feeling bad or failure about yourself  0 1 0 - -  Trouble concentrating 0 1 0 - -  Moving slowly or fidgety/restless 0 0 0 - -  Suicidal thoughts 0 0 0 - -  PHQ-9 Score 2 6 6  - -    GAD 7 : Generalized Anxiety Score 07/22/2015 05/22/2015 04/30/2015  Nervous, Anxious, on Edge 0 0 0  Control/stop worrying 0 0 0  Worry too much - different things 0 1 0  Trouble relaxing 1 1 0  Restless 0 0 0  Easily annoyed or irritable 0 1 0  Afraid - awful might happen 0 0 0  Total GAD 7 Score 1 3 0

## 2015-08-10 ENCOUNTER — Encounter: Payer: Self-pay | Admitting: Family Medicine

## 2015-08-10 ENCOUNTER — Ambulatory Visit: Payer: Medicaid Other | Attending: Family Medicine | Admitting: Family Medicine

## 2015-08-10 VITALS — BP 101/64 | HR 97 | Temp 98.7°F | Resp 16 | Ht 63.0 in | Wt 142.0 lb

## 2015-08-10 DIAGNOSIS — Z77120 Contact with and (suspected) exposure to mold (toxic): Secondary | ICD-10-CM | POA: Diagnosis not present

## 2015-08-10 DIAGNOSIS — Z79899 Other long term (current) drug therapy: Secondary | ICD-10-CM | POA: Insufficient documentation

## 2015-08-10 DIAGNOSIS — R0602 Shortness of breath: Secondary | ICD-10-CM | POA: Insufficient documentation

## 2015-08-10 DIAGNOSIS — R05 Cough: Secondary | ICD-10-CM | POA: Insufficient documentation

## 2015-08-10 DIAGNOSIS — N760 Acute vaginitis: Secondary | ICD-10-CM | POA: Diagnosis not present

## 2015-08-10 MED ORDER — CLOTRIMAZOLE 2 % VA CREA: 1.0000 | TOPICAL_CREAM | Freq: Every day | VAGINAL | Status: DC
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
## 2015-08-10 NOTE — Progress Notes (Signed)
Vaginal discharge No changes since last visit  St[facility]  No tobacco  No suicidal thoughts in two weeks

## 2015-08-10 NOTE — Assessment & Plan Note (Signed)
Letter written

## 2015-08-10 NOTE — Assessment & Plan Note (Signed)
Topical clotrimazole 

## 2015-08-10 NOTE — Patient Instructions (Signed)
Desiree Gutierrez was seen today for vaginal itching.  Diagnoses and all orders for this visit:  Vaginitis -     clotrimazole (GYNE-LOTRIMIN 3) 2 % vaginal cream; Place 1 Applicatorful vaginally at bedtime.  Mold exposure   [facility] Letter written to assist with eradication of household mold Try lotrimin for vaginitis  F/u in 3 months, sooner if needed  Dr. Armen PickupFunches

## 2015-09-08 ENCOUNTER — Encounter: Payer: Self-pay | Admitting: Internal Medicine

## 2015-09-08 ENCOUNTER — Ambulatory Visit (INDEPENDENT_AMBULATORY_CARE_PROVIDER_SITE_OTHER): Payer: Medicaid Other | Admitting: Internal Medicine

## 2015-09-08 VITALS — BP 122/66 | HR 89 | Ht 63.0 in | Wt 151.8 lb

## 2015-09-08 DIAGNOSIS — R0600 Dyspnea, unspecified: Secondary | ICD-10-CM | POA: Diagnosis not present

## 2015-09-08 DIAGNOSIS — I5189 Other ill-defined heart diseases: Secondary | ICD-10-CM | POA: Insufficient documentation

## 2015-09-08 DIAGNOSIS — I519 Heart disease, unspecified: Secondary | ICD-10-CM | POA: Diagnosis not present

## 2015-09-08 DIAGNOSIS — R0689 Other abnormalities of breathing: Secondary | ICD-10-CM | POA: Diagnosis not present

## 2015-09-08 NOTE — Progress Notes (Signed)
Subjective:     Patient ID: Desiree Gutierrez, female   DOB: 06-03-1990, 25 y.o.   MRN: [id number]  HPI     HPI  IOV 05/21/2015  Chief Complaint  Patient presents with  . Pulmonary Consult    Pt referred by Dr. Armen Pickup for SOB x 3 months. Pt states the SOB occurs with activty and rest. Pt c/o non prod cough with chest congestion and midsternal chest tightness when SOB or coughing - pain radiates to mid back.      25 year old female self-referred for shortness of breath. History is given by her and by review of chart from [facility] and color on emergency room records.   She reports a diagnosis of Wolff-Parkinson-White syndrome for the last 6 years and is status post ablation in 2011 but despite that has had arrhythmia issues. According to her history she has tried several antiarrhythmics but has had intolerance issues with all of them. She follows with Dr. Pedro Earls at [facility] a little physiology department. She reports that for the last 3 months she's had insidious onset of shortness of breath that is severe. These happen at rest and is episodic and also with exertion. They're relieved by rest. It is associated with tachycardia and also bradycardia that she feels subjectively. She says that she has had 70 emergency room visits in the last 6 months all  Related to shortness of breath and apparently she's been hypoxemic when EMS picks her up. When I review the emergency room records it appears that she probably has only one ER visit a month. A lot of this is due to syncope. I could not find any evidence of hypoxemia documented. Although she did have November 2016 seen CT angiogram chest that ruled out pulmonary emboli and she had clear lung fields. I personally visualized this image.   other lab work for May 12 2015 include normal electrolytes and creatinine. Normal liver function tests and a normal hemoglobin of 12.4 g percent.    today in the office we did exhaled nitric oxide  for asthma and it is normal less than 5 ppb. We also walked her 185 feet 3 laps on room air and she did not desaturate or get tachycardic.   problem list from [facility] Syncope - Vasovagal - failed fludricortisone, midodrine, inderal, citalopram - Ziopatch - 7/30-8/12/15: Min HR 57 max 153 bpm, Ave 91 pbm. Predominant 91 bpm, symptoms occurred during sinus rhythm - multiple ED visits In 2015 for recurrenc syncope - Last episode 03/10/2015 treated at [facility] ED per patient (see notes in CareEverywhere), had episode of symptomatic hyoglycemia in clinic today  Possilble POTS - reportedly positive tilt table test in distant past but negative tilt table here more recently Palpitations - h/o WPW s/p left posterior pathway ablation by Dr. 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  -  Echocardiogram 03/15/2015: Left ventricle ejection fraction is normal. Right ventricle is normal. Overall echo was reported as normal.   04/17/2015: Underwent EP study at [facility] by Dr. Cristi Loron. According to the patient there was a foci that could not be ablated but according to my review of the notes of the procedure it was a negative EP study with 4 hours flat  time. Notes indicate that tachycardia could not be induced with aggressive straight pacing an extra stimuli.   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  But there were plans to initiate Ivabradine  For which she could not afford   other lab workup 08/16/2011 -  Autoimmune lab work including anti-neuronal nuclear antibody , acetal colon receptor binding antibody an other antibodies are negative. But I do not see evidence of her having  h    07/02/15 Follow up : SOB  Pt returns for 1 month follow up . Recently seen last month for pulmonary consult for dyspnea.  She has a history of WPW s/p ablation in 2011. She complains over last 3-4 months of sob with activity .  Pt was set up for a CPST  mild functional impairment when compared to matched sedentary norms. There was mild chronotropic incompetence in addition to poor measured PVO2 and low, with early flattening O2/pulse. There are no clear ventilatory limtiation, however there is mild evidence of circulatory limitations which could be due to diastolic dysfunction in combination with arrhythmia and suspected POTS. In addition, patient is likely poorly conditioned due to persistent syncope. .  Pre spirometry was normal lung function. W/ FVC 104%, FEV1 109%, ratio 104 Went to ER again with syncopal episode 06/13/15 . EKG showed NSR w/ nad. Has recurrent episodes of presyncope, syncope.  discussed her results with pt and her dad.  Case reviewed with Dr. Marchelle Gearingamaswamy and pt needs to see Cardiology back to see if cardiac source.  She denies chest pain. Orthopnea, edema or fever.     OV 09/08/2015  Chief Complaint  Patient presents with  . Follow-up    pt c/o worsening sob with exertion, chest pressure with more strenuous exertion X3 mos. Pt did not start Qvar d/t tachycardia.       Follow-up dyspnea in a patient with Wolff-Parkinson-White syndrome  Cardiopulmonary stress test in spring 2017 suggested cardiac issues such as diastolic dysfunction on her pots syndrome or arrhythmia as a possible cause of shortness of breath. There is no evidence of asthma so far but x-rays induced bronchus present test was not done. Last visit when she followed up with nurse practitioner she was given empiric trial of Qvar but patient thought this was a bronchodilator and was concerned about tachycardia and did not take it. Currently she says dyspnea persists unchanged      has a past medical  history of WPW (Wolff-Parkinson-White syndrome); Vasovagal syncope; POTS (postural orthostatic tachycardia syndrome); Palpitations; and Headache.   reports that she has never smoked. She has never used smokeless tobacco.  Past Surgical History  Procedure Laterality Date  . Cardiac electrophysiology study and ablation  2011    [facility] for WPW  . Tilt table study  2011    neurally mediated syncope  . Loop recorder implant  01/22/2014    MD[facility] LINQ [facility] of palpitations/syncope    Allergies  Allergen Reactions  . Amitiza [Lubiprostone] Anaphylaxis, Shortness Of Breath and Swelling  . Other Palpitations  . Ketorolac Palpitations  . Metoclopramide Other (See Comments)    Heart raced  . Midodrine Other (See Comments)    migraines  . Fludrocortisone Rash    Immunization History  Administered Date(s) Administered  . PPD Test 10/08/2014  . Pneumococcal Polysaccharide-23 10/17/2014    Family History  Problem Relation Age of Onset  .  Diabetes Mother   . Hypertension Mother   . Stroke Mother   . Hypertension Brother   . Seizures Neg Hx      Current outpatient prescriptions:  .  Ascorbic Acid (VITAMIN C) 1000 MG tablet, Take 1,000 mg by mouth daily., Disp: , Rfl:  .  beclomethasone (QVAR) 80 MCG/ACT inhaler, Inhale 2 puffs into the lungs 2 (two) times daily., Disp: 1 Inhaler, Rfl: 6 .  Calcium-Magnesium-Vitamin D (CALCIUM MAGNESIUM PO), Take 2 tablets by mouth daily., Disp: , Rfl:  .  Cholecalciferol (D-5000) 5000 UNITS TABS, Take 5,000 Units by mouth daily., Disp: , Rfl:  .  clotrimazole (GYNE-LOTRIMIN 3) 2 % vaginal cream, Place 1 Applicatorful vaginally at bedtime., Disp: 21 g, Rfl: 0 .  Coenzyme Q10 (COQ10 PO), Take 1 tablet by mouth daily., Disp: , Rfl:  .  Cyanocobalamin (RA VITAMIN B-12 TR) 1000 MCG TBCR, Take 1,000 mcg by mouth daily., Disp: , Rfl:  .  ferrous gluconate (FERGON) 324 MG  tablet, Take 324 mg by mouth daily with breakfast., Disp: , Rfl:  .  Omega-3 1000 MG CAPS, Take 1 capsule by mouth daily., Disp: , Rfl:  .  Probiotic Product (PROBIOTIC PO), Take 1 tablet by mouth daily., Disp: , Rfl:  .  ranitidine (ZANTAC) 150 MG tablet, Take 1 tablet (150 mg total) by mouth 2 (two) times daily., Disp: 60 tablet, Rfl: 0 .  sodium chloride 1 G tablet, Take 1 g by mouth 2 (two) times daily., Disp: , Rfl:   Current facility-administered medications:  .  gi cocktail (Maalox,Lidocaine,Donnatal), 30 mL, Oral, Once, [name], MD    Review of Systems     Objective:   Physical Exam  Filed Vitals:   09/08/15 1044  BP: 122/66  Pulse: 89  Height: 5\' 3"  (1.6 m)  Weight: 151 lb 12.8 oz (68.856 kg)  SpO2: 100%   Brief exam alert and oriented 3 clear to auscultation bilaterally normal heart sounds regular rate and rhythm     Assessment:       ICD-9-CM ICD-10-CM   1. Dyspnea and respiratory abnormality 786.09 R06.00     R06.89   2. Diastolic dysfunction 429.9 I51.9        Plan:       Not sure you have asthma - I doubt you have it But you can trial qvar 2 puff twice daily for a month to see if it helps your problems   - this should not increase your heart rate Shortness of breath is due to physical deconditioning and possibly the heart muscle not relaxing well   PLAN  =- pulmonary rehab at [facility] - start qvar trial for 1-3 months  followup  - ROV with me in 3 months    (> 50% of this 15 min visit spent in face to face counseling or/and coordination of care)  Dr. Kalman Shan, M.D., [facility].C.P Pulmonary and Critical Care Medicine Staff Physician [facility]  Pulmonary and Critical Care Pager: [phone], If no answer or between  15:00h - 7:00h: call [phone]  09/08/2015 10:59 AM

## 2015-09-08 NOTE — Patient Instructions (Addendum)
ICD-9-CM ICD-10-CM   1. Dyspnea and respiratory abnormality 786.09 R06.00     R06.89   2. Diastolic dysfunction 429.9 I51.9     Not sure you have asthma - I doubt you have it But you can trial qvar 2 puff twice daily for a month to see if it helps your problems   - this should not increase your heart rate Shortness of breath is due to physical deconditioning and possibly the heart muscle not relaxing well   PLAN  =- pulmonary rehab at [place] - start qvar trial for 1-3 months  followup  - ROV with me in 3 months

## 2015-09-17 ENCOUNTER — Emergency Department (HOSPITAL_COMMUNITY)
Admission: EM | Admit: 2015-09-17 | Discharge: 2015-09-17 | Disposition: A | Discharge status: Home / Self Care | Payer: Medicaid Other | Attending: Emergency Medicine | Admitting: Emergency Medicine

## 2015-09-17 ENCOUNTER — Emergency Department (HOSPITAL_COMMUNITY): Payer: Medicaid Other

## 2015-09-17 DIAGNOSIS — I456 Pre-excitation syndrome: Secondary | ICD-10-CM | POA: Diagnosis not present

## 2015-09-17 DIAGNOSIS — Z79899 Other long term (current) drug therapy: Secondary | ICD-10-CM | POA: Diagnosis not present

## 2015-09-17 DIAGNOSIS — R0789 Other chest pain: Secondary | ICD-10-CM | POA: Diagnosis not present

## 2015-09-17 DIAGNOSIS — R55 Syncope and collapse: Secondary | ICD-10-CM | POA: Diagnosis present

## 2015-09-17 DIAGNOSIS — R079 Chest pain, unspecified: Secondary | ICD-10-CM

## 2015-09-17 LAB — I-STAT BETA HCG BLOOD, ED (MC, WL, AP ONLY)

## 2015-09-17 NOTE — Discharge Instructions (Signed)
Please continue to follow-up with your cardiologist and pulmonologist regarding further work-up of your symptoms.  Syncope Syncope means a person passes out (faints). The person usually wakes up in less than 5 minutes. It is important to seek medical care for syncope. HOME CARE  Have someone stay with you until you feel normal.  Do not drive, use machines, or play sports until your doctor says it is okay.  Keep all doctor visits as told.  Lie down when you feel like you might pass out. Take deep breaths. Wait until you feel normal before standing up.  Drink enough fluids to keep your pee (urine) clear or pale yellow.  If you take blood pressure or heart medicine, get up slowly. Take several minutes to sit and then stand. GET HELP RIGHT AWAY IF:   You have a severe headache.  You have pain in the chest, belly (abdomen), or back.  You are bleeding from the mouth or butt (rectum).  You have black or tarry poop (stool).  You have an irregular or very fast heartbeat.  You have pain with breathing.  You keep passing out, or you have shaking (seizures) when you pass out.  You pass out when sitting or lying down.  You feel confused.  You have trouble walking.  You have severe weakness.  You have vision problems. If you fainted, call for help (911 in [place] Do not drive yourself to the hospital.   This information is not intended to replace advice given to you by your health care provider. Make sure you discuss any questions you have with your health care provider.   Document Released: 09/07/2007 Document Revised: 08/05/2014 Document Reviewed: 05/20/2011 Elsevier Interactive Patient Education [employer]2016 [employer] Inc.

## 2015-09-17 NOTE — ED Notes (Signed)
Pt ambulated in hall with pulse Ox check. O2 sat 100 on RA.

## 2015-09-17 NOTE — ED Provider Notes (Signed)
CSN: [id number]     Arrival date & time 09/17/15  1142 History   First MD Initiated Contact with Patient 09/17/15 1149     Chief Complaint  Patient presents with  . Loss of Consciousness     (Consider location/radiation/quality/duration/timing/severity/associated sxs/prior Treatment) HPI 25 year old female who presents with syncope. She has a history of WPW status post ablation in 2011, history of recurrent vasovagal syncope, and [facility] by Dr. Alfonso EllisBeaty and by a pulmonologist. States that more recently she has been feeling a little bit more short of breath. Today at rest developed palpitations, chest pressure, shortness of breath and lightheadedness. Had syncopal episode subsequently. No urine or bowel incontinence or tongue biting.   On chart review, with multiple ED visits for syncope, felt to be vasovagal in nature.  Saw pulmonology on 09/08/2015 for w/u of SOB that has been ongoing more more than 3 months - PFT not convincing for asthma but [facility] treatment [facility]: for vasovagal syncope, failed fludrocortisone, midodrin, inderal, and celexa Had holter monitoring in 10/2013-11/2013 without arrhythmia Worked up for POTS with recent negative tilt table test ECHO 03/15/2015 with normal LVEF and RV function [facility] 04/16/2015 overall unremarkable with no inducible arrhythmia or accessory pathway   Past Medical History  Diagnosis Date  . WPW (Wolff-Parkinson-White syndrome)     a. s/p ablation (2011) [facility] (posterior lateral pathway)  . Vasovagal syncope     a. positive tilt 2011 b. failed medical therapy with Florinef, Midodrine, Inderal, Celexa  . POTS (postural orthostatic tachycardia syndrome)   . Palpitations     a. s/p MDT ILR implanted by Dr Alfonso EllisBeaty [facility]  . Headache    Past Surgical History  Procedure Laterality Date  . Cardiac electrophysiology study and ablation  2011    [facility]- left posterior  pathway ablation for WPW  . Tilt table study  2011    neurally mediated syncope  . Loop recorder implant  01/22/2014    MDT LINQ [facility] for evaluation of palpitations/syncope   Family History  Problem Relation Age of Onset  . Diabetes Mother   . Hypertension Mother   . Stroke Mother   . Hypertension Brother   . Seizures Neg Hx    Social History  Substance Use Topics  . Smoking status: Never Smoker   . Smokeless tobacco: Never Used  . Alcohol Use: No   OB History    No data available     Review of Systems 10/14 systems reviewed and are negative other than those stated in the HPI    Allergies  Amitiza; Other; Ketorolac; Metoclopramide; Midodrine; and Fludrocortisone  Home Medications   Prior to Admission medications   Medication Sig Start Date End Date Taking? Authorizing Provider  Ascorbic Acid (VITAMIN C) 1000 MG tablet Take 1,000 mg by mouth daily.   Yes Historical Provider, MD  Calcium-Magnesium-Vitamin D (CALCIUM MAGNESIUM PO) Take 2 tablets by mouth daily.   Yes Historical Provider, MD  Cholecalciferol (D-5000) 5000 UNITS TABS Take 5,000 Units by mouth daily.   Yes Historical Provider, MD  Coenzyme Q10 (COQ10 PO) Take 1 tablet by mouth daily.   Yes Historical Provider, MD  Cyanocobalamin (RA VITAMIN B-12 TR) 1000 MCG TBCR Take 1,000 mcg by mouth daily.   Yes Historical Provider, MD  ferrous gluconate (FERGON) 324 MG tablet Take 324 mg by mouth daily with breakfast.   Yes  Historical Provider, MD  Omega-3 1000 MG CAPS Take 1 capsule by mouth daily.   Yes Historical Provider, MD  Probiotic Product (PROBIOTIC PO) Take 1 tablet by mouth daily.   Yes Historical Provider, MD  sodium chloride 1 G tablet Take 1 g by mouth 2 (two) times daily.   Yes Historical Provider, MD  beclomethasone (QVAR) 80 MCG/ACT inhaler Inhale 2 puffs into the lungs 2 (two) times daily. Patient not taking: Reported on 09/17/2015 07/02/15   Virgel Bouquet Parrett, NP  ranitidine  (ZANTAC) 150 MG tablet Take 1 tablet (150 mg total) by mouth 2 (two) times daily. Patient not taking: Reported on 09/17/2015 05/22/15   [name], MD   BP 109/67 mmHg  Pulse 92  Temp(Src) 98.6 F (37 C) (Oral)  Resp 14  Ht  (1.6 m)  Wt 142 lb (64.411 kg)  BMI 25.16 kg/m2  SpO2 100%  LMP 08/18/2015 Physical Exam Physical Exam  Nursing note and vitals reviewed. Constitutional: Well developed, well nourished, non-toxic, and in no acute distress Head: Normocephalic and atraumatic.  Mouth/Throat: Oropharynx is clear and moist.  Neck: Normal range of motion. Neck supple.  Cardiovascular: Normal rate and regular rhythm.   Pulmonary/Chest: Effort normal and breath sounds normal.  Abdominal: Soft. There is no tenderness. There is no rebound and no guarding.  Musculoskeletal: Normal range of motion.  Neurological: Alert, no facial droop, fluent speech, moves all extremities symmetrically Skin: Skin is warm and dry.  Psychiatric: Cooperative   ED Course  Procedures (including critical care time) Labs Review Labs Reviewed  I-STAT BETA HCG BLOOD, ED ([facility], [facility], [facility] ONLY)    Imaging Review Dg Chest 2 View  09/17/2015  CLINICAL DATA:  Shortness of breath. EXAM: CHEST  2 VIEW COMPARISON:  Radiograph of February 08, 2015. FINDINGS: The heart size and mediastinal contours are within normal limits. Both lungs are clear. No pneumothorax or pleural effusion is noted. The visualized skeletal structures are unremarkable. IMPRESSION: No active cardiopulmonary disease. Electronically Signed   By: Lupita Raider, M.D.   On: 09/17/2015 13:25   I have personally reviewed and evaluated these images and lab results as part of my medical decision-making.   EKG Interpretation   Date/Time:  Thursday September 17 2015 13:31:43 EDT Ventricular Rate:  85 PR Interval:  123 QRS Duration: 88 QT Interval:  362 QTC Calculation: 430 R Axis:   67 Text Interpretation:  Sinus rhythm No significant change  since last  tracing Confirmed by LIU MD, DANA [phone]) on 09/17/2015 4:47:09 PM      MDM   Final diagnoses:  Chest pain  Syncope and collapse    25 year old female with history of WPW status post ablation with recurrent and persistent vasovagal syncope who presents with syncopal episode. She is well-appearing in no acute distress on presentation. EKG without stigmata of arrhythmia. Negative pregnancy test. CXR clear. Walking pulse ox normal. Has had extensive work-up for symptoms by cardiology and pulmonology (listed in HPI), with plans for pulmonary rehab and referral to [facility]. I feel that she is stable for continued outpatient follow-up with her specialists. Strict return and follow-up instructions reviewed. She expressed understanding of all discharge instructions and felt comfortable with the plan of care.    Lavera Guise, MD 09/17/15 [phone]

## 2015-09-17 NOTE — ED Notes (Signed)
Patient with a history of WPW reports having a syncopal episode this AM.  This was an unwitnessed event. Patient is alert and oriented at present.

## 2015-09-18 ENCOUNTER — Ambulatory Visit: Payer: Medicaid Other | Attending: Family Medicine | Admitting: Family Medicine

## 2015-09-18 ENCOUNTER — Encounter: Payer: Self-pay | Admitting: Family Medicine

## 2015-09-18 VITALS — BP 100/63 | HR 89 | Temp 98.8°F | Resp 16 | Ht 63.0 in | Wt 142.0 lb

## 2015-09-18 DIAGNOSIS — Z79899 Other long term (current) drug therapy: Secondary | ICD-10-CM | POA: Diagnosis not present

## 2015-09-18 DIAGNOSIS — R0689 Other abnormalities of breathing: Secondary | ICD-10-CM | POA: Diagnosis not present

## 2015-09-18 DIAGNOSIS — R0600 Dyspnea, unspecified: Secondary | ICD-10-CM

## 2015-09-18 DIAGNOSIS — R55 Syncope and collapse: Secondary | ICD-10-CM | POA: Insufficient documentation

## 2015-09-18 MED ORDER — IPRATROPIUM BROMIDE HFA 17 MCG/ACT IN AERS: 2.0000 | INHALATION_SPRAY | Freq: Four times a day (QID) | RESPIRATORY_TRACT | Status: DC | PRN

## 2015-09-18 MED FILL — ATROVENT HFA INHALER: 17 | 30 days supply | Qty: 13 | Fill #0

## 2015-09-18 NOTE — Progress Notes (Signed)
[facility]  No changes since ED visit  Pain scale #7 No tobacco user  No suicidal thoughts in the past two weeks

## 2015-09-18 NOTE — Patient Instructions (Addendum)
Bowen was seen today for loss of consciousness.  Diagnoses and all orders for this visit:  Dyspnea and respiratory abnormality -     ipratropium (ATROVENT HFA) 17 MCG/ACT inhaler; Inhale 2 puffs into the lungs every 6 (six) hours as needed for wheezing.  Syncope and collapse -     Cancel: Glucose (CBG)   Atrovent did not raise HR in the office. Also did not seem to help with SOB, but is safe to use if you have significant SOB at home. Up to 2 pumps.   I will call and send a message to Dr. Alfonso EllisBeaty relaying the need for medical records and the ED physicians recommendation for repeat ECHO  F/u with me in 6 weeks, sooner if needed for syncope   Dr. Armen PickupFunches

## 2015-09-18 NOTE — Progress Notes (Signed)
Subjective:  Patient ID: Desiree Gutierrez, female    DOB: 03/01/1991  Age: 25 y.o. MRN: [id number]  CC: Loss of Consciousness   HPI [name] presents for f/u POTs syndrome, hx of WPW s/p ablatio (2011)  recurrent syncope    1. F/u syncope: since last OV she has been to ED once. Yesterday she went to the ED  for unwitnessed syncope.  This occurred at home after returning from a few hours of shopping. She did have breakfast in the morning. ED work up included normal CXR, negative U preg, normal EKG x 2 with HR 77-85.  Her last syncope episode prior to this one was one week ago. She did present to care. [facility] an autonomic dysfunction specialist by her cardiologist as extensive w/u has not revealed the cause of her recurrent syncope.   2. Chest pain: still with SOB and CP that occurs most nights. Wakes her up from sleep. There is no associated cough, fever or cold symptoms. Symptoms also occur during the day. She is distressed by SOB.   Social History  Substance Use Topics  . Smoking status: Never Smoker   . Smokeless tobacco: Never Used  . Alcohol Use: No    Outpatient Prescriptions Prior to Visit  Medication Sig Dispense Refill  . Ascorbic Acid (VITAMIN C) 1000 MG tablet Take 1,000 mg by mouth daily.    . beclomethasone (QVAR) 80 MCG/ACT inhaler Inhale 2 puffs into the lungs 2 (two) times daily. (Patient not taking: Reported on 09/17/2015) 1 Inhaler 6  . Calcium-Magnesium-Vitamin D (CALCIUM MAGNESIUM PO) Take 2 tablets by mouth daily.    . Cholecalciferol (D-5000) 5000 UNITS TABS Take 5,000 Units by mouth daily.    . Coenzyme Q10 (COQ10 PO) Take 1 tablet by mouth daily.    . Cyanocobalamin (RA VITAMIN B-12 TR) 1000 MCG TBCR Take 1,000 mcg by mouth daily.    . ferrous gluconate (FERGON) 324 MG tablet Take 324 mg by mouth daily with breakfast.    . Omega-3 1000 MG CAPS Take 1 capsule by mouth daily.    . Probiotic Product (PROBIOTIC PO) Take  1 tablet by mouth daily.    . ranitidine (ZANTAC) 150 MG tablet Take 1 tablet (150 mg total) by mouth 2 (two) times daily. (Patient not taking: Reported on 09/17/2015) 60 tablet 0  . sodium chloride 1 G tablet Take 1 g by mouth 2 (two) times daily.     Facility-Administered Medications Prior to Visit  Medication Dose Route Frequency Provider Last Rate Last Dose  . gi cocktail (Maalox,Lidocaine,Donnatal)  30 mL Oral Once Lafonda Patron, MD        ROS Review of Systems  Constitutional: Positive for appetite change (always feels hungry and never satisfied ) and fatigue. Negative for fever and chills.  HENT: Positive for dental problem.   Eyes: Negative for visual disturbance.  Respiratory: Positive for shortness of breath.   Cardiovascular: Positive for chest pain and palpitations.  Gastrointestinal: Positive for nausea and vomiting. Negative for abdominal pain and blood in stool.  Musculoskeletal: Negative for back pain and arthralgias.  Skin: Negative for rash.  Allergic/Immunologic: Negative for immunocompromised state.  Neurological: Positive for syncope.  Hematological: Negative for adenopathy. Does not bruise/bleed easily.  Psychiatric/Behavioral: Negative for suicidal ideas and dysphoric mood.    Objective:  BP 100/63 mmHg  Pulse 89  Temp(Src) 98.8 F (37.1 C) (Oral)  Resp 16  Ht 5\' 3"  (1.6 m)  Wt 142 lb (64.411 kg)  BMI 25.16 kg/m2  SpO2 100%  LMP 08/18/2015  BP/Weight 09/18/2015 09/17/2015 09/08/2015  Systolic BP 100 109 122  Diastolic BP 63 67 66  Wt. (Lbs) 142 142 151.8  BMI 25.16 25.16 26.9   Physical Exam  Constitutional: She is oriented to person, place, and time. She appears well-developed and well-nourished. No distress.  HENT:  Head: Normocephalic and atraumatic.  Cardiovascular: Normal rate, regular rhythm, normal heart sounds and intact distal pulses.   Pulmonary/Chest: Effort normal and breath sounds normal.  Musculoskeletal: She exhibits no edema or  tenderness.  Neurological: She is alert and oriented to person, place, and time.  Skin: Skin is warm and dry. No rash noted.  Psychiatric: She has a normal mood and affect.   Lab Results  Component Value Date   HGBA1C 5.20 11/06/2014   Ambulatory pulse Ox 94-100 Resting HR 84  Treated with Atrovent MDI r x 3 doses  No change in SOB symptoms, no change in HR or O2 sat. Patient developed tunnel vision about 15 minutes after doses   Depression screen [facility] 2/9 09/18/2015 08/10/2015 07/22/2015 05/22/2015 04/30/2015  Decreased Interest 0 0 0 0 0  Down, Depressed, Hopeless 0 0 0 0 0  PHQ - 2 Score 0 0 0 0 0  Altered sleeping 0 0 0 1 3  Tired, decreased energy 0 0 Change in appetite 0 0 0 0 0  Feeling bad or failure about yourself  0 0 0 1 0  Trouble concentrating 0 0 0 1 0  Moving slowly or fidgety/restless 0 0 0 0 0  Suicidal thoughts 0 0 0 0 0  PHQ-9 Score 0 0 GAD 7 : Generalized Anxiety Score 09/18/2015 08/10/2015 07/22/2015 05/22/2015  Nervous, Anxious, on Edge 0 0 0 0  Control/stop worrying 0 0 0 0  Worry too much - different things 0 0 0 1  Trouble relaxing 0 0 1 1  Restless 0 0 0 0  Easily annoyed or irritable 0 0 0 1  Afraid - awful might happen 0 0 0 0  Total GAD 7 Score 0 0 1 3      Assessment & Plan:   There are no diagnoses linked to this encounter. Follow-up: No Follow-up on file.   [name] MD

## 2015-09-21 NOTE — Assessment & Plan Note (Signed)
Subjective dyspnea No objective dysfunction No improvement with atrovent Albuterol is to be avoided due to patient hx of tachycardia

## 2015-10-03 ENCOUNTER — Emergency Department (HOSPITAL_COMMUNITY)
Admission: EM | Admit: 2015-10-03 | Discharge: 2015-10-03 | Disposition: A | Discharge status: Home / Self Care | Payer: Medicaid Other | Attending: Emergency Medicine | Admitting: Emergency Medicine

## 2015-10-03 ENCOUNTER — Encounter (HOSPITAL_COMMUNITY): Payer: Self-pay | Admitting: *Deleted

## 2015-10-03 DIAGNOSIS — R55 Syncope and collapse: Secondary | ICD-10-CM

## 2015-10-03 DIAGNOSIS — Z79899 Other long term (current) drug therapy: Secondary | ICD-10-CM | POA: Insufficient documentation

## 2015-10-03 DIAGNOSIS — R0789 Other chest pain: Secondary | ICD-10-CM | POA: Diagnosis not present

## 2015-10-03 LAB — I-STAT BETA HCG BLOOD, ED (MC, WL, AP ONLY): I-stat hCG, quantitative: <5 m[IU]/mL (ref <5)

## 2015-10-03 NOTE — ED Notes (Signed)
Pt ambulatory without difficulty, denies dizziness or lightheadedness.

## 2015-10-03 NOTE — ED Notes (Signed)
Pt presents via [facility] from home after a syncopal episode witnessed by mothers nurse, lasting approximately 5 mins, pt fell backwards onto floor, denies neck or back pain, reports HA.  Hx: WPW and POTS with frequent syncopal episodes.  Reports trying to follow up with her cardiologist but they are unable to see her for 6 months.  Pt also reports CP radiating to back started approximately 10 mins prior to episode but has been on and off for a couple weeks.  324 ASA given by EMS.  EKG unremarkable per EMS, BP-120/70 P-98 NSR O2-98% CBG-101.  A x 4, NAD.

## 2015-10-03 NOTE — ED Provider Notes (Signed)
CSN: [id number]     Arrival date & time 10/03/15  1149 History   First MD Initiated Contact with Patient 10/03/15 1205     Chief Complaint  Patient presents with  . Loss of Consciousness  . Chest Pain     (Consider location/radiation/quality/duration/timing/severity/associated sxs/prior Treatment) HPI Desiree Gutierrez is a 25 y.o. female with a history of WPW, POTS, multiple visits to emergency department for evaluation of syncopal events, here for evaluation of acute syncopal event. Patient reports approximately 11:00 she had one of her typical syncopal events falling backwards onto the floor. She denies any neck, back pain comes in numbness or weakness, vision changes. She does report a mild headache. States she has a follow-up appointment with her cardiologist, but not for 6 months. She also reports intermittently over the past several weeks that she has had bursts of chest discomfort that feels like "electrocution". Fleeting in nature. Denies exertional component. No discomfort now in the ED.  Past Medical History  Diagnosis Date  . WPW (Wolff-Parkinson-White syndrome)     a. s/p ablation (2011) [facility] (post[facility] pathway)  . Vasovagal syncope     a. positive tilt 2011 b. failed medical therapy with Florinef, Midodrine, Inderal, Celexa  . POTS (postural orthostatic tachycardia syndrome)   . Palpitations     a. s/p MDT ILR implanted by Dr Alfonso EllisBeaty [facility]  . Headache    Past Surgical History  Procedure Laterality Date  . Cardiac electrophysiology study and ablation  2011    [facility]- left posterior pathway ablation for WPW  . Tilt table study  2011    neurally mediated syncope  . Loop recorder implant  01/22/2014    MDT LINQ [facility] for evaluation of palpitations/syncope   Family History  Problem Relation Age of Onset  . Diabetes Mother   . Hypertension Mother   . Stroke Mother   . Hypertension Brother   . Seizures Neg Hx     Social History  Substance Use Topics  . Smoking status: Never Smoker   . Smokeless tobacco: Never Used  . Alcohol Use: No   OB History    No data available     Review of Systems A 10 point review of systems was completed and was negative except for pertinent positives and negatives as mentioned in the history of present illness     Allergies  Amitiza; Other; Ketorolac; Metoclopramide; Midodrine; and Fludrocortisone  Home Medications   Prior to Admission medications   Medication Sig Start Date End Date Taking? Authorizing Provider  Ascorbic Acid (VITAMIN C) 1000 MG tablet Take 1,000 mg by mouth daily.    Historical Provider, MD  beclomethasone (QVAR) 80 MCG/ACT inhaler Inhale 2 puffs into the lungs 2 (two) times daily. 07/02/15   Tammy S Parrett, NP  Calcium-Magnesium-Vitamin D (CALCIUM MAGNESIUM PO) Take 2 tablets by mouth daily.    Historical Provider, MD  Cholecalciferol (D-5000) 5000 UNITS TABS Take 5,000 Units by mouth daily.    Historical Provider, MD  Coenzyme Q10 (COQ10 PO) Take 1 tablet by mouth daily.    Historical Provider, MD  Cyanocobalamin (RA VITAMIN B-12 TR) 1000 MCG TBCR Take 1,000 mcg by mouth daily.    Historical Provider, MD  ferrous gluconate (FERGON) 324 MG tablet Take 324 mg by mouth daily with breakfast.    Historical Provider, MD  ipratropium (ATROVENT HFA) 17 MCG/ACT inhaler Inhale 2 puffs into the lungs every 6 (six) hours  as needed for wheezing. 09/18/15   Josalyn Funches, MD  Omega-3 1000 MG CAPS Take 1 capsule by mouth daily.    Historical Provider, MD  Probiotic Product (PROBIOTIC PO) Take 1 tablet by mouth daily.    Historical Provider, MD  ranitidine (ZANTAC) 150 MG tablet Take 1 tablet (150 mg total) by mouth 2 (two) times daily. 05/22/15   Josalyn Funches, MD  sodium chloride 1 G tablet Take 1 g by mouth 2 (two) times daily.    Historical Provider, MD   BP 119/80 mmHg  Pulse 97  Temp(Src) 98.7 F (37.1 C) (Oral)  Resp 16  Ht 5\' 3"  (1.6 m)   Wt 64.411 kg  BMI 25.16 kg/m2  SpO2 100%  LMP 08/18/2015 Physical Exam  Constitutional: She is oriented to person, place, and time. She appears well-developed and well-nourished.  HENT:  Head: Normocephalic and atraumatic.  Mouth/Throat: Oropharynx is clear and moist.  Eyes: Conjunctivae are normal. Pupils are equal, round, and reactive to light. Right eye exhibits no discharge. Left eye exhibits no discharge. No scleral icterus.  Neck: Neck supple.  Cardiovascular: Normal rate, regular rhythm and normal heart sounds.   Pulmonary/Chest: Effort normal and breath sounds normal. No respiratory distress. She has no wheezes. She has no rales.  Abdominal: Soft. There is no tenderness.  Musculoskeletal: She exhibits no tenderness.  Neurological: She is alert and oriented to person, place, and time.  Cranial Nerves II-XII grossly intact. Moves all extremities without ataxia. Motor strength 5/5 in all 4 extremities. Sensation intact to light touch. Completes fine motor coordination movements without difficulty. Gait baseline  Skin: Skin is warm and dry. No rash noted.  Psychiatric: She has a normal mood and affect.  Nursing note and vitals reviewed.   ED Course  Procedures (including critical care time) Labs Review Labs Reviewed  I-STAT BETA HCG BLOOD, ED ([facility], [facility], [facility] ONLY)    Imaging Review No results found. I have personally reviewed and evaluated these images and lab results as part of my medical decision-making.   EKG Interpretation   Date/Time:  Saturday October 03 2015 11:53:23 EDT Ventricular Rate:  81 PR Interval:    QRS Duration: 92 QT Interval:  348 QTC Calculation: 404 R Axis:   94 Text Interpretation:  Right and left arm electrode reversal,  interpretation assumes no reversal Sinus rhythm Borderline right axis  deviation Borderline Q waves in lateral leads Abnormal T, consider  ischemia, lateral leads No significant change since last tracing Confirmed  by [facility]  MD,  MARTHA [phone]([id number]) on 10/03/2015 12:56:15 PM      MDM  Patient presents with typical syncope and collapse, EKG is unchanged. Pregnancy negative. Physical exam is unremarkable. She overall appears very well. Chest discomfort described as very atypical and not concerning at this time. Per care plan, patient will be discharged home. Discussed follow-up with cardiologist, patient verbalizes understanding and agrees with this plan. Prior to patient discharge, I discussed and reviewed this case with Dr.Linker    Final diagnoses:  Syncope and collapse       Joycie PeekBenjamin Alexander Aument, PA-C 10/03/15 1511  Jerelyn ScottMartha Linker, MD 10/03/15 1515

## 2015-10-03 NOTE — ED Notes (Signed)
PA at bedside.

## 2015-10-03 NOTE — Discharge Instructions (Signed)
Your exam and EKG are reassuring. Your Pregnancy test is negative. There does not appear to be an emergent cause for your symptoms at this time. It is important to follow-up with your cardiologist next week for reevaluation. Return to ED for any new or worsening symptoms.  Syncope Syncope is a medical term for fainting or passing out. This means you lose consciousness and drop to the ground. People are generally unconscious for less than 5 minutes. You may have some muscle twitches for up to 15 seconds before waking up and returning to normal. Syncope occurs more often in older adults, but it can happen to anyone. While most causes of syncope are not dangerous, syncope can be a sign of a serious medical problem. It is important to seek medical care.  CAUSES  Syncope is caused by a sudden drop in blood flow to the brain. The specific cause is often not determined. Factors that can bring on syncope include:  Taking medicines that lower blood pressure.  Sudden changes in posture, such as standing up quickly.  Taking more medicine than prescribed.  Standing in one place for too long.  Seizure disorders.  Dehydration and excessive exposure to heat.  Low blood sugar (hypoglycemia).  Straining to have a bowel movement.  Heart disease, irregular heartbeat, or other circulatory problems.  Fear, emotional distress, seeing blood, or severe pain. SYMPTOMS  Right before fainting, you may:  Feel dizzy or light-headed.  Feel nauseous.  See all white or all black in your field of vision.  Have cold, clammy skin. DIAGNOSIS  Your health care provider will ask about your symptoms, perform a physical exam, and perform an electrocardiogram (ECG) to record the electrical activity of your heart. Your health care provider may also perform other heart or blood tests to determine the cause of your syncope which may include:  Transthoracic echocardiogram (TTE). During echocardiography, sound waves are  used to evaluate how blood flows through your heart.  Transesophageal echocardiogram (TEE).  Cardiac monitoring. This allows your health care provider to monitor your heart rate and rhythm in real time.  Holter monitor. This is a portable device that records your heartbeat and can help diagnose heart arrhythmias. It allows your health care provider to track your heart activity for several days, if needed.  Stress tests by exercise or by giving medicine that makes the heart beat faster. TREATMENT  In most cases, no treatment is needed. Depending on the cause of your syncope, your health care provider may recommend changing or stopping some of your medicines. HOME CARE INSTRUCTIONS  Have someone stay with you until you feel stable.  Do not drive, use machinery, or play sports until your health care provider says it is okay.  Keep all follow-up appointments as directed by your health care provider.  Lie down right away if you start feeling like you might faint. Breathe deeply and steadily. Wait until all the symptoms have passed.  Drink enough fluids to keep your urine clear or pale yellow.  If you are taking blood pressure or heart medicine, get up slowly and take several minutes to sit and then stand. This can reduce dizziness. SEEK IMMEDIATE MEDICAL CARE IF:   You have a severe headache.  You have unusual pain in the chest, abdomen, or back.  You are bleeding from your mouth or rectum, or you have black or tarry stool.  You have an irregular or very fast heartbeat.  You have pain with breathing.  You have repeated  fainting or seizure-like jerking during an episode.  You faint when sitting or lying down.  You have confusion.  You have trouble walking.  You have severe weakness.  You have vision problems. If you fainted, call your local emergency services (911 in [place] Do not drive yourself to the hospital.    This information is not intended to replace advice given to  you by your health care provider. Make sure you discuss any questions you have with your health care provider.   Document Released: 03/21/2005 Document Revised: 08/05/2014 Document Reviewed: 05/20/2011 Elsevier Interactive Patient Education [employer]2016 Elsevier Inc.

## 2015-10-14 ENCOUNTER — Emergency Department (HOSPITAL_COMMUNITY)
Admission: EM | Admit: 2015-10-14 | Discharge: 2015-10-15 | Disposition: A | Discharge status: Home / Self Care | Payer: Medicaid Other | Attending: Dermatology | Admitting: Dermatology

## 2015-10-14 ENCOUNTER — Encounter (HOSPITAL_COMMUNITY): Payer: Self-pay | Admitting: Nurse Practitioner

## 2015-10-14 DIAGNOSIS — R55 Syncope and collapse: Secondary | ICD-10-CM | POA: Diagnosis present

## 2015-10-14 DIAGNOSIS — Z79899 Other long term (current) drug therapy: Secondary | ICD-10-CM | POA: Diagnosis not present

## 2015-10-14 DIAGNOSIS — Z5321 Procedure and treatment not carried out due to patient leaving prior to being seen by health care provider: Secondary | ICD-10-CM | POA: Diagnosis not present

## 2015-10-14 NOTE — ED Notes (Signed)
Pt states she had her typical syncope episode, and adds that before that happened she had chest pain and palpitations.

## 2015-10-15 ENCOUNTER — Ambulatory Visit: Payer: Medicaid Other | Attending: Family Medicine | Admitting: Family Medicine

## 2015-10-15 ENCOUNTER — Encounter: Payer: Self-pay | Admitting: Family Medicine

## 2015-10-15 ENCOUNTER — Telehealth: Payer: Self-pay | Admitting: Family Medicine

## 2015-10-15 VITALS — BP 107/67 | HR 96 | Temp 98.7°F | Resp 18 | Ht 64.0 in | Wt 149.0 lb

## 2015-10-15 DIAGNOSIS — D692 Other nonthrombocytopenic purpura: Secondary | ICD-10-CM | POA: Diagnosis not present

## 2015-10-15 DIAGNOSIS — T671XXS Heat syncope, sequela: Secondary | ICD-10-CM | POA: Diagnosis not present

## 2015-10-15 NOTE — Assessment & Plan Note (Signed)
Recurrent syncope Scheduled f/u with her cardiologist for next week Continue with plan for evaluation by autonomic dysfunction specialist

## 2015-10-15 NOTE — Progress Notes (Signed)
Patient is here for ED FU Syncope  Patient states she fainted yesterday.  Patient has taken medication today. Patient has also eaten today.  Patient complains of constant aching in her chest.

## 2015-10-15 NOTE — Telephone Encounter (Signed)
Called over to [facility] for the following reason  1. Sooner f/u appt as patient continues to have syncope and CP  Follow up appt scheduled for  July 20 at 4:15 PM on the 7th floor of the [facility], arrive 15 min early. Bring photo ID, insurance card, be prepared to pay co-pay.    2. Have medical records been forwarded to [facility]? The schedule I spoke to was unable to confirm that they have been faxed.  3. Please ask Dr. Alfonso EllisBeaty to call me if he has any particular recommendations while patient awaits evaluation at [facility]. I left my cell phone #.

## 2015-10-15 NOTE — Patient Instructions (Addendum)
Lyndal was seen today for follow-up.  Diagnoses and all orders for this visit:  Purpura (HCC) -     CBC -     COMPLETE METABOLIC PANEL WITH GFR  Heat syncope, sequela   Follow up appt scheduled for  July 20 at 4:15 PM on the 7th floor of the [facility], arrive 15 min early. Bring photo ID, insurance card, be prepared to pay co-pay.   You will be called with lab results  F/u in 4 weeks for syncope   Dr. Armen PickupFunches

## 2015-10-15 NOTE — Progress Notes (Signed)
Subjective:  Patient ID: Desiree Gutierrez, female    DOB: [birth year]  Age: 25 y.o. MRN: [id number]  CC: Loss of Consciousness   HPI Desiree Gutierrez presents for f/u POTs syndrome, hx of WPW s/p ablation (2011)  recurrent syncope    1. F/u syncope: since last OV she has been to ED twice for syncope. She was last in the ED yesterday (10/14/15) for witnessed syncope. She did not stay to be evaluated. She reports another syncopal episode last night. She has been referred to [facility] to see an autonomic dysfunction specialist by her cardiologist as extensive w/u has not revealed the cause of her recurrent syncope.   2. Chest pain: still with SOB and CP that occurs most nights. Wakes her up from sleep. There is no associated cough, fever or cold symptoms. Symptoms also occur during the day. She is distressed by SOB.   3. Skin discoloration: comes and goes for past 3 weeks. last for about 15 minutes.  She reports bluish discoloration in extremities. No bleeding. No joint swelling or rash.   4. Trouble with housing: she reports that there is sewage backing up in her toilet at home.   Social History  Substance Use Topics  . Smoking status: Never Smoker   . Smokeless tobacco: Never Used  . Alcohol Use: No    Outpatient Prescriptions Prior to Visit  Medication Sig Dispense Refill  . Ascorbic Acid (VITAMIN C) 1000 MG tablet Take 1,000 mg by mouth daily.    . beclomethasone (QVAR) 80 MCG/ACT inhaler Inhale 2 puffs into the lungs 2 (two) times daily. (Patient not taking: Reported on 10/14/2015) 1 Inhaler 6  . Calcium-Magnesium-Vitamin D (CALCIUM MAGNESIUM PO) Take 2 tablets by mouth daily.    . Cholecalciferol (D-5000) 5000 UNITS TABS Take 5,000 Units by mouth daily.    . Coenzyme Q10 (COQ10 PO) Take 1 tablet by mouth daily.    . Cyanocobalamin (RA VITAMIN B-12 TR) 1000 MCG TBCR Take 1,000 mcg by mouth daily.    . ferrous gluconate (FERGON) 324 MG tablet Take 324 mg by mouth daily with  breakfast.    . ibuprofen (ADVIL,MOTRIN) 200 MG tablet Take 400 mg by mouth every 6 (six) hours as needed for headache.    . ipratropium (ATROVENT HFA) 17 MCG/ACT inhaler Inhale 2 puffs into the lungs every 6 (six) hours as needed for wheezing. (Patient not taking: Reported on 10/14/2015) 1 Inhaler 0  . Omega-3 1000 MG CAPS Take 1 capsule by mouth daily.    . Probiotic Product (PROBIOTIC PO) Take 1 tablet by mouth daily.    . ranitidine (ZANTAC) 150 MG tablet Take 1 tablet (150 mg total) by mouth 2 (two) times daily. (Patient not taking: Reported on 10/14/2015) 60 tablet 0  . sodium chloride 1 G tablet Take 1 g by mouth 2 (two) times daily.     Facility-Administered Medications Prior to Visit  Medication Dose Route Frequency Provider Last Rate Last Dose  . gi cocktail (Maalox,Lidocaine,Donnatal)  30 mL Oral Once Damon Baisch, MD        ROS Review of Systems  Constitutional: Positive for appetite change (always feels hungry and never satisfied ) and fatigue. Negative for fever and chills.  Eyes: Negative for visual disturbance.  Respiratory: Positive for shortness of breath.   Cardiovascular: Positive for chest pain and palpitations.  Gastrointestinal: Positive for nausea and vomiting. Negative for abdominal pain and blood in stool.  Musculoskeletal: Negative for back pain and arthralgias.  Skin:  Positive for color change. Negative for rash.  Allergic/Immunologic: Negative for immunocompromised state.  Neurological: Positive for syncope.  Hematological: Negative for adenopathy. Does not bruise/bleed easily.  Psychiatric/Behavioral: Negative for suicidal ideas and dysphoric mood.    Objective:  BP 107/67 mmHg  Pulse 96  Temp(Src) 98.7 F (37.1 C) (Oral)  Resp 18  Ht 5\' 4"  (1.626 m)  Wt 149 lb (67.586 kg)  BMI 25.56 kg/m2  SpO2 100%  LMP 10/01/2015  BP/Weight 10/15/2015 10/14/2015 10/03/2015  Systolic BP 107 118 119  Diastolic BP 67 69 80  Wt. (Lbs) 149 142 142  BMI 25.56 25.16  25.16   Physical Exam  Constitutional: She is oriented to person, place, and time. She appears well-developed and well-nourished. No distress.  HENT:  Head: Normocephalic and atraumatic.  Cardiovascular: Normal rate, regular rhythm, normal heart sounds and intact distal pulses.   Pulmonary/Chest: Effort normal and breath sounds normal.  Musculoskeletal: She exhibits no edema or tenderness.  Neurological: She is alert and oriented to person, place, and time.  Skin: Skin is warm and dry. No rash noted.  Psychiatric: She has a normal mood and affect.    Assessment & Plan:   Marget was seen today for loss of consciousness.  Diagnoses and all orders for this visit:  Purpura (HCC) -     CBC -     COMPLETE METABOLIC PANEL WITH GFR  Heat syncope, sequela   Follow-up: Return in about 4 weeks (around 11/12/2015) for recurrent syncope .   Dessa PhiJosalyn Amyiah Gaba MD

## 2015-10-15 NOTE — Assessment & Plan Note (Signed)
Patient describes what sounds like purpura Plan CBC

## 2015-11-02 ENCOUNTER — Telehealth (HOSPITAL_COMMUNITY): Payer: Self-pay

## 2015-11-02 NOTE — Telephone Encounter (Signed)
Spoke with patient regarding Pulmonary Rehab referral. After discussing patient's history with RN, it was decided that we wait to get the patient in the program until the cause of her syncapal episodes is determined. Patient was encouraged to contact us in the future if her situation changes.

## 2015-11-04 ENCOUNTER — Emergency Department (HOSPITAL_COMMUNITY)
Admission: EM | Admit: 2015-11-04 | Discharge: 2015-11-05 | Disposition: A | Discharge status: Home / Self Care | Payer: Medicaid Other | Attending: Emergency Medicine | Admitting: Emergency Medicine

## 2015-11-04 ENCOUNTER — Encounter (HOSPITAL_COMMUNITY): Payer: Self-pay

## 2015-11-04 ENCOUNTER — Emergency Department (HOSPITAL_COMMUNITY): Payer: Medicaid Other

## 2015-11-04 DIAGNOSIS — R002 Palpitations: Secondary | ICD-10-CM | POA: Diagnosis not present

## 2015-11-04 DIAGNOSIS — R079 Chest pain, unspecified: Secondary | ICD-10-CM | POA: Diagnosis present

## 2015-11-04 DIAGNOSIS — R0602 Shortness of breath: Secondary | ICD-10-CM | POA: Insufficient documentation

## 2015-11-04 LAB — BASIC METABOLIC PANEL
Anion gap: 6 (ref 5–15)
BUN: 8 mg/dL (ref 6–20)
CALCIUM: 8.9 mg/dL (ref 8.9–10.3)
CO2: 25 mmol/L (ref 22–32)
Chloride: 108 mmol/L (ref 101–111)
Creatinine, Ser: 0.7 mg/dL (ref 0.44–1.00)
GFR calc Af Amer: >60 mL/min (ref >60)
GLUCOSE: 128 mg/dL — AB (ref 65–99)
POTASSIUM: 3.8 mmol/L (ref 3.5–5.1)
SODIUM: 139 mmol/L (ref 135–145)

## 2015-11-04 LAB — CBC
HCT: 34.1 % — ABNORMAL LOW (ref 36.0–46.0)
Hemoglobin: 10.6 g/dL — ABNORMAL LOW (ref 12.0–15.0)
MCH: 28.1 pg (ref 26.0–34.0)
MCHC: 31.1 g/dL (ref 30.0–36.0)
MCV: 90.5 fL (ref 78.0–100.0)
Platelets: 226 K/uL (ref 150–400)
RBC: 3.77 MIL/uL — ABNORMAL LOW (ref 3.87–5.11)
RDW: 11.9 % (ref 11.5–15.5)
WBC: 8.1 K/uL (ref 4.0–10.5)

## 2015-11-04 LAB — I-STAT TROPONIN, ED: TROPONIN I, POC: 0 ng/mL (ref 0.00–0.08)

## 2015-11-04 NOTE — ED Triage Notes (Signed)
Pt. Coming from home via [facility] for CP. Pt. Reports feeling dizzy then laying down and getting central chest pain that radiated to her back  8/10. Pt. 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 Pt. Given 324 ASA, but no nitro bc of WPW. EMS unable to get IV access. Pt. Aox4, skin dry and warm, and no visible signs of distress.

## 2015-11-04 NOTE — ED Notes (Signed)
Pt to xray

## 2015-11-04 NOTE — ED Provider Notes (Signed)
[facility]-EMERGENCY DEPT Provider Note   CSN: [id number] Arrival date & time: 11/04/15  2149  First Provider Contact:  First MD Initiated Contact with Patient 11/04/15 2203        History   Chief Complaint Chief Complaint  Patient presents with  . Chest Pain    HPI Desiree Gutierrez is a 25 y.o. female.  HPI   Desiree Gutierrez is a 25 y.o. female, with a history of WPW, POTS, and palpitations, presenting to the ED with palpitations and shortness of breath that came on about 30 minutes ago while walking through the house. Symptoms lasted until she arrived at the ED. Pt states, "I usually just pass out when this happens, but I didn't today." These episodes usually come on multiple times a week. Pt sees Dr. Brayton Mars at [facility] is patient's cardiologist. Last visit was last Thursday. Pt had an unsuccessful ablation for WPW performed in 2011. Pt was surgically reevaluated January 2017 with no irregularities of the ablation were found. Pt has been referred to [facility] by Dr. Brayton Mars due to the complexity of the patient's case. Pt denies LOC, CP, current shortness of breath, N/V, fever/chills, recent illness, or any other complaints.   Past Medical History:  Diagnosis Date  . Headache   . Palpitations    a. s/p MDT ILR implanted by Dr Alfonso Ellis [facility])  . POTS (postural orthostatic tachycardia syndrome)   . Vasovagal syncope    a. positive tilt 2011 b. failed medical therapy with Florinef, Midodrine, Inderal, Celexa  . WPW (Wolff-Parkinson-White syndrome)    a. s/p ablation (2011) Dr Gevena Cotton at [facility] (posterior lateral pathway)    Patient Active Problem List   Diagnosis Date Noted  . Purpura (HCC) 10/15/2015  . Dyspnea and respiratory abnormality 09/08/2015  . Diastolic dysfunction 09/08/2015  . Mold exposure 08/10/2015  . Vocal cord dysfunction 06/02/2015  . Stress at home 05/22/2015  . SOB (shortness of breath) 04/30/2015  . Pain of molar 02/13/2015  . Observed seizure-like activity  (HCC) 02/02/2015  . Fall   . Hypoglycemia   . POTS (postural orthostatic tachycardia syndrome) 06/01/2014  . Iron deficiency anemia 06/01/2014  . Tachycardia 05/29/2014  . Syncope 12/03/2010  . WOLFF-PARKINSON-WHITE (WPW) SYNDROME status post ablation 08/20/2009  . Constipation 08/19/2009  . Chest pain 08/19/2009    Past Surgical History:  Procedure Laterality Date  . CARDIAC ELECTROPHYSIOLOGY STUDY AND ABLATION  2011   Dr. Gevena Cotton at [facility]- left posterior pathway ablation for WPW  . LOOP RECORDER IMPLANT  01/22/2014   MDT LINQ implanted by Dr Alfonso Ellis at [facility] for evaluation of palpitations/syncope  . TILT TABLE STUDY  2011   neurally mediated syncope    OB History    No data available       Home Medications    Prior to Admission medications   Medication Sig Start Date End Date Taking? Authorizing Provider  Ascorbic Acid (VITAMIN C) 1000 MG tablet Take 1,000 mg by mouth daily.   Yes Historical Provider, MD  Calcium-Magnesium-Vitamin D (CALCIUM MAGNESIUM PO) Take 2 tablets by mouth daily.   Yes Historical Provider, MD  Cholecalciferol (D-5000) 5000 UNITS TABS Take 5,000 Units by mouth daily.   Yes Historical Provider, MD  Coenzyme Q10 (COQ10 PO) Take 1 tablet by mouth daily.   Yes Historical Provider, MD  Cyanocobalamin (RA VITAMIN B-12 TR) 1000 MCG TBCR Take 1,000 mcg by mouth daily.   Yes Historical Provider, MD  D-Ribose (RIBOSE, D,) POWD Take 200 g by mouth daily.  Yes Historical Provider, MD  docusate sodium (COLACE) 100 MG capsule Take 100 mg by mouth daily.   Yes Historical Provider, MD  ferrous gluconate (FERGON) 324 MG tablet Take 324 mg by mouth daily with breakfast.   Yes Historical Provider, MD  ibuprofen (ADVIL,MOTRIN) 200 MG tablet Take 400 mg by mouth 2 (two) times daily as needed for headache, moderate pain or cramping.    Yes Historical Provider, MD  Levocarnitine HCl POWD Take 500 mg by mouth daily.    Yes Historical Provider, MD  Omega-3 1000 MG CAPS  Take 1 capsule by mouth daily.   Yes Historical Provider, MD  Probiotic Product (PROBIOTIC PO) Take 1 tablet by mouth daily.   Yes Historical Provider, MD  sodium chloride 1 G tablet Take 1 g by mouth 2 (two) times daily.   Yes Historical Provider, MD    Family History Family History  Problem Relation Age of Onset  . Diabetes Mother   . Hypertension Mother   . Stroke Mother   . Hypertension Brother   . Seizures Neg Hx     Social History Social History  Substance Use Topics  . Smoking status: Never Smoker  . Smokeless tobacco: Never Used  . Alcohol use No     Allergies   Amitiza [lubiprostone]; Other; Ketorolac; Metoclopramide; Midodrine; and Fludrocortisone   Review of Systems Review of Systems  Constitutional: Negative for chills, diaphoresis and fever.  Respiratory: Positive for shortness of breath (resolved). Negative for cough and chest tightness.   Cardiovascular: Positive for palpitations (resolved). Negative for chest pain and leg swelling.  Gastrointestinal: Negative for abdominal pain, nausea and vomiting.  Genitourinary: Negative for dysuria and flank pain.  Musculoskeletal: Negative for back pain.  Skin: Negative for color change and pallor.  Neurological: Negative for dizziness, syncope, weakness and light-headedness.  All other systems reviewed and are negative.    Physical Exam Updated Vital Signs BP 108/60 (BP Location: Right Arm)   Pulse 91   Resp 14   Ht 5\' 3"  (1.6 m)   Wt 64.4 kg   LMP 10/31/2015   SpO2 100%   BMI 25.15 kg/m   Physical Exam  Constitutional: She is oriented to person, place, and time. She appears well-developed and well-nourished. No distress.  HENT:  Head: Normocephalic and atraumatic.  Eyes: Conjunctivae and EOM are normal. Pupils are equal, round, and reactive to light.  Neck: Neck supple.  Cardiovascular: Normal rate, regular rhythm, normal heart sounds and intact distal pulses.   Pulmonary/Chest: Effort normal and  breath sounds normal. No respiratory distress.  Abdominal: Soft. There is no tenderness. There is no guarding.  Musculoskeletal: She exhibits no edema or tenderness.  Lymphadenopathy:    She has no cervical adenopathy.  Neurological: She is alert and oriented to person, place, and time. She has normal reflexes.  No sensory deficits. Strength 5/5 in all extremities. No gait disturbance. Coordination intact. Cranial nerves III-XII grossly intact. No facial droop.   Skin: Skin is warm and dry. She is not diaphoretic.  Psychiatric: She has a normal mood and affect. Her behavior is normal.  Nursing note and vitals reviewed.    ED Treatments / Results  Labs (all labs ordered are listed, but only abnormal results are displayed) Labs Reviewed  BASIC METABOLIC PANEL - Abnormal; Notable for the following:       Result Value   Glucose, Bld 128 (*)    All other components within normal limits  CBC - Abnormal; Notable for  the following:    RBC 3.77 (*)    Hemoglobin 10.6 (*)    HCT 34.1 (*)    All other components within normal limits  I-STAT TROPOININ, ED   Hemoglobin  Date Value Ref Range Status  11/04/2015 10.6 (L) 12.0 - 15.0 g/dL Final  40/98/1191 47.8 12.0 - 15.0 g/dL Final  29/56/2130 86.5 (L) 12.0 - 15.0 g/dL Final  78/46/9629 52.8 (L) 12.0 - 15.0 g/dL Final   HGB  Date Value Ref Range Status  03/28/2014 11.6 (L) 12.0 - 16.0 g/dL Final    EKG  EKG Interpretation  Date/Time:  Wednesday November 04 2015 22:23:04 EDT Ventricular Rate:  92 PR Interval:    QRS Duration: 90 QT Interval:  353 QTC Calculation: 437 R Axis:   70 Text Interpretation:  Sinus rhythm Borderline short PR interval Confirmed by Fayrene Fearing  MD, MARK ([id number]) on 11/04/2015 11:17:53 PM       Radiology Dg Chest 2 View  Result Date: 11/04/2015 CLINICAL DATA:  Chest pain for 2 hours. EXAM: CHEST  2 VIEW COMPARISON:  Most recent chest imaging radiographs 09/17/2015 FINDINGS: The cardiomediastinal contours are  normal. The lungs are clear. Pulmonary vasculature is normal. No consolidation, pleural effusion, or pneumothorax. No acute osseous abnormalities are seen. IMPRESSION: No acute pulmonary process. Electronically Signed   By: Rubye Oaks M.D.   On: 11/04/2015 22:53    Procedures Procedures (including critical care time)  Medications Ordered in ED Medications - No data to display   Initial Impression / Assessment and Plan / ED Course  I have reviewed the triage vital signs and the nursing notes.  Pertinent labs & imaging results that were available during my care of the patient were reviewed by me and considered in my medical decision making (see chart for details).  Clinical Course    Elesha Ruberg presents with an episode of shortness of breath and palpitations that has since resolved.  Findings and plan of care discussed with Rolland Porter, MD.  This patient's presentation is consistent with an acute exacerbation of a chronic issue. Patient remained symptom-free during her time in the ED. Low suspicion for ACS. Anemia noted, however, it is nearly consistent with previous values and does not seem to be currently causing symptoms here in the ED. Upon reassessment, patient states she is still symptom-free. Not tachycardic upon discharge evaluation. Pt advised to follow up with her cardiologist as soon as possible. Return precautions discussed. Patient voices understanding of these instructions, accepts the plan, and is comfortable with discharge.    Vitals:   11/04/15 2200 11/04/15 2230 11/04/15 2314 11/04/15 2315  BP: 104/75 110/67 110/67   Pulse: 86 91  102  Resp: SpO2: 100% 100%  99%  Weight:      Height:           Final Clinical Impressions(s) / ED Diagnoses   Final diagnoses:  Shortness of breath  Palpitations    New Prescriptions New Prescriptions   No medications on file     Concepcion Living 11/04/15 2356    Rolland Porter, MD 11/17/15 [phone]

## 2015-11-04 NOTE — Discharge Instructions (Signed)
You have been seen today for palpitations and shortness of breath. Your imaging and lab tests showed no acute abnormalities. Follow up with PCP and your cardiologist as soon as possible. Return to ED should symptoms recur or you have any other major concerns.

## 2015-11-09 ENCOUNTER — Encounter (HOSPITAL_COMMUNITY): Payer: Self-pay | Admitting: *Deleted

## 2015-11-09 ENCOUNTER — Emergency Department (HOSPITAL_COMMUNITY)
Admission: EM | Admit: 2015-11-09 | Discharge: 2015-11-10 | Disposition: A | Discharge status: Home / Self Care | Payer: Medicaid Other | Attending: Emergency Medicine | Admitting: Emergency Medicine

## 2015-11-09 DIAGNOSIS — Z791 Long term (current) use of non-steroidal anti-inflammatories (NSAID): Secondary | ICD-10-CM | POA: Diagnosis not present

## 2015-11-09 DIAGNOSIS — K5900 Constipation, unspecified: Secondary | ICD-10-CM | POA: Diagnosis not present

## 2015-11-09 DIAGNOSIS — Z79899 Other long term (current) drug therapy: Secondary | ICD-10-CM | POA: Insufficient documentation

## 2015-11-09 DIAGNOSIS — R1012 Left upper quadrant pain: Secondary | ICD-10-CM | POA: Diagnosis present

## 2015-11-09 NOTE — ED Triage Notes (Signed)
Pt arrives to the ER via EMS with complaints of abd pain x 3 hrs; pt states that the pain is sharp and is to her LUQ and LLQ; pt c/o nausea with no vomiting

## 2015-11-10 ENCOUNTER — Emergency Department (HOSPITAL_COMMUNITY): Payer: Medicaid Other

## 2015-11-10 LAB — COMPREHENSIVE METABOLIC PANEL
ALBUMIN: 4.7 g/dL (ref 3.5–5.0)
ALK PHOS: 58 U/L (ref 38–126)
ALT: 13 U/L — ABNORMAL LOW (ref 14–54)
AST: 23 U/L (ref 15–41)
Anion gap: 8 (ref 5–15)
BILIRUBIN TOTAL: 0.2 mg/dL — AB (ref 0.3–1.2)
BUN: 10 mg/dL (ref 6–20)
CALCIUM: 9.5 mg/dL (ref 8.9–10.3)
CO2: 25 mmol/L (ref 22–32)
Chloride: 107 mmol/L (ref 101–111)
Creatinine, Ser: 0.65 mg/dL (ref 0.44–1.00)
GFR calc Af Amer: >60 mL/min (ref >60)
GFR calc non Af Amer: >60 mL/min (ref >60)
Glucose, Bld: 113 mg/dL — ABNORMAL HIGH (ref 65–99)
Potassium: 3.7 mmol/L (ref 3.5–5.1)
SODIUM: 140 mmol/L (ref 135–145)
TOTAL PROTEIN: 7.7 g/dL (ref 6.5–8.1)

## 2015-11-10 LAB — URINALYSIS, ROUTINE W REFLEX MICROSCOPIC
Bilirubin Urine: NEGATIVE
Glucose, UA: NEGATIVE mg/dL
Hgb urine dipstick: NEGATIVE
Ketones, ur: NEGATIVE mg/dL
Leukocytes, UA: NEGATIVE
NITRITE: NEGATIVE
PH: 6.5 (ref 5.0–8.0)
Protein, ur: NEGATIVE mg/dL
SPECIFIC GRAVITY, URINE: 1.027 (ref 1.005–1.030)

## 2015-11-10 LAB — I-STAT BETA HCG BLOOD, ED (MC, WL, AP ONLY)

## 2015-11-10 LAB — CBC
HEMATOCRIT: 38.6 % (ref 36.0–46.0)
HEMOGLOBIN: 12.3 g/dL (ref 12.0–15.0)
MCH: 28.5 pg (ref 26.0–34.0)
MCHC: 31.9 g/dL (ref 30.0–36.0)
MCV: 89.4 fL (ref 78.0–100.0)
Platelets: 250 10*3/uL (ref 150–400)
RBC: 4.32 MIL/uL (ref 3.87–5.11)
RDW: 11.8 % (ref 11.5–15.5)
WBC: 8.4 10*3/uL (ref 4.0–10.5)

## 2015-11-10 LAB — LIPASE, BLOOD: Lipase: 35 U/L (ref 11–51)

## 2015-11-10 MED ORDER — POLYETHYLENE GLYCOL 3350 17 GM/SCOOP PO POWD: 17.0000 g | Freq: Every day | ORAL | 0 refills | Status: DC

## 2015-11-10 NOTE — ED Provider Notes (Addendum)
[facility]-EMERGENCY DEPT Provider Note   CSN: [id number] Arrival date & time: 11/09/15  2333  By signing my name below, I, Alyssa Grove, attest that this documentation has been prepared under the direction and in the presence of Earley Favor, NP. Electronically Signed: Alyssa Grove, ED Scribe. 11/10/15. 2:02 AM.   First MD Initiated Contact with Patient 11/10/15 0155    History   Chief Complaint Chief Complaint  Patient presents with  . Abdominal Pain   The history is provided by the patient. No language interpreter was used.    HPI Comments: Desiree Gutierrez is a 25 y.o. female who presents to the Emergency Department complaining of gradual onset, constant, sharp upper abdominal pain onset 8:30 PM. Pt reports associated nausea. Pt states she was standing when she first noticed it. Pt states she thought she might have needed to eat something, but that did not relieve her pain. Pt reports the pain gradually became worse and started to radiate down to her lower abdomen. Pt's brother called 911 when he found the pt laying on the floor and diaphoretic. Pt states her pain is exacerbated by walking, bending over and touching it. Pt has had 2 unsuccessful ablations with the most recent in January. Pt has a hx of constipation, but states she had a normal bowel movement this morning. Pt states she has not taken anything for her pain. She denies vaginal discharge.  Past Medical History:  Diagnosis Date  . Headache   . Palpitations    a. s/p MDT ILR implanted by Dr Alfonso Ellis [facility])  . POTS (postural orthostatic tachycardia syndrome)   . Vasovagal syncope    a. positive tilt 2011 b. failed medical therapy with Florinef, Midodrine, Inderal, Celexa  . WPW (Wolff-Parkinson-White syndrome)    a. s/p ablation (2011) Dr Gevena Cotton at [facility] (posterior lateral pathway)    Patient Active Problem List   Diagnosis Date Noted  . Purpura (HCC) 10/15/2015  . Dyspnea and respiratory abnormality 09/08/2015  . Diastolic  dysfunction 09/08/2015  . Mold exposure 08/10/2015  . Vocal cord dysfunction 06/02/2015  . Stress at home 05/22/2015  . SOB (shortness of breath) 04/30/2015  . Pain of molar 02/13/2015  . Observed seizure-like activity (HCC) 02/02/2015  . Fall   . Hypoglycemia   . POTS (postural orthostatic tachycardia syndrome) 06/01/2014  . Iron deficiency anemia 06/01/2014  . Tachycardia 05/29/2014  . Syncope 12/03/2010  . WOLFF-PARKINSON-WHITE (WPW) SYNDROME status post ablation 08/20/2009  . Constipation 08/19/2009  . Chest pain 08/19/2009    Past Surgical History:  Procedure Laterality Date  . CARDIAC ELECTROPHYSIOLOGY STUDY AND ABLATION  2011   Dr. Gevena Cotton at [facility]- left posterior pathway ablation for WPW  . LOOP RECORDER IMPLANT  01/22/2014   MDT LINQ implanted by Dr Alfonso Ellis at [facility] for evaluation of palpitations/syncope  . TILT TABLE STUDY  2011   neurally mediated syncope    OB History    No data available       Home Medications    Prior to Admission medications   Medication Sig Start Date End Date Taking? Authorizing Provider  Ascorbic Acid (VITAMIN C) 1000 MG tablet Take 1,000 mg by mouth daily.   Yes Historical Provider, MD  Calcium-Magnesium-Vitamin D (CALCIUM MAGNESIUM PO) Take 2 tablets by mouth daily.   Yes Historical Provider, MD  Cholecalciferol (D-5000) 5000 UNITS TABS Take 5,000 Units by mouth daily.   Yes Historical Provider, MD  Coenzyme Q10 (COQ10 PO) Take 1 tablet by mouth daily.   Yes  Historical Provider, MD  Cyanocobalamin (RA VITAMIN B-12 TR) 1000 MCG TBCR Take 1,000 mcg by mouth daily.   Yes Historical Provider, MD  D-Ribose (RIBOSE, D,) POWD Take 200 g by mouth daily.   Yes Historical Provider, MD  docusate sodium (COLACE) 100 MG capsule Take 100 mg by mouth daily.   Yes Historical Provider, MD  ferrous gluconate (FERGON) 324 MG tablet Take 324 mg by mouth daily with breakfast.   Yes Historical Provider, MD  ibuprofen (ADVIL,MOTRIN) 200 MG tablet Take 400  mg by mouth 2 (two) times daily as needed for headache, moderate pain or cramping.    Yes Historical Provider, MD  Levocarnitine HCl POWD Take 500 mg by mouth daily.    Yes Historical Provider, MD  Omega-3 1000 MG CAPS Take 1 capsule by mouth daily.   Yes Historical Provider, MD  Probiotic Product (PROBIOTIC PO) Take 1 tablet by mouth daily.   Yes Historical Provider, MD  sodium chloride 1 G tablet Take 1 g by mouth 2 (two) times daily.   Yes Historical Provider, MD  polyethylene glycol powder (GLYCOLAX/MIRALAX) powder Take 17 g by mouth daily. 11/10/15   Earley Favor, NP    Family History Family History  Problem Relation Age of Onset  . Diabetes Mother   . Hypertension Mother   . Stroke Mother   . Hypertension Brother   . Seizures Neg Hx     Social History Social History  Substance Use Topics  . Smoking status: Never Smoker  . Smokeless tobacco: Never Used  . Alcohol use No     Allergies   Amitiza [lubiprostone]; Other; Ketorolac; Metoclopramide; Midodrine; and Fludrocortisone   Review of Systems Review of Systems   Physical Exam Updated Vital Signs BP 111/75 (BP Location: Left Arm)   Pulse 82   Temp 98.2 F (36.8 C) (Oral)   Resp 18   LMP 10/31/2015   SpO2 99%   Physical Exam  Constitutional: She appears well-developed and well-nourished.  HENT:  Head: Normocephalic.  Eyes: Pupils are equal, round, and reactive to light.  Neck: Normal range of motion.  Cardiovascular: Normal rate.   Pulmonary/Chest: Effort normal.  Abdominal: Soft. She exhibits distension. There is tenderness in the right upper quadrant, epigastric area, periumbilical area, suprapubic area and left upper quadrant.  Neurological: She is alert.  Skin: Skin is warm and dry. Capillary refill takes less than 2 seconds.  Nursing note and vitals reviewed.    ED Treatments / Results  Labs (all labs ordered are listed, but only abnormal results are displayed) Labs Reviewed  COMPREHENSIVE  METABOLIC PANEL - Abnormal; Notable for the following:       Result Value   Glucose, Bld 113 (*)    ALT 13 (*)    Total Bilirubin 0.2 (*)    All other components within normal limits  URINALYSIS, ROUTINE W REFLEX MICROSCOPIC (NOT AT [facility]) - Abnormal; Notable for the following:    APPearance CLOUDY (*)    All other components within normal limits  LIPASE, BLOOD  CBC  I-STAT BETA HCG BLOOD, ED ([facility], [facility], [facility] ONLY)    EKG  EKG Interpretation None       Radiology Dg Abd Acute W/chest  Result Date: 11/10/2015 CLINICAL DATA:  Left-sided abdominal pain since last night. Chest pain and shortness of breath. Constipation. EXAM: DG ABDOMEN ACUTE W/ 1V CHEST COMPARISON:  Chest radiographs 11/04/2015 FINDINGS: The cardiomediastinal contours are normal. The lungs are clear. There is no free intra-abdominal air.  No dilated bowel loops to suggest obstruction. Moderate volume of stool throughout the colon. Small stool in the rectum. No radiopaque calculi. No acute osseous abnormalities are seen. IMPRESSION: Moderate stool burden can be seen with constipation. No obstruction. Electronically Signed   By: Rubye OaksMelanie  Ehinger M.D.   On: 11/10/2015 02:56    Procedures Procedures (including critical care time)  Medications Ordered in ED Medications - No data to display   Initial Impression / Assessment and Plan / ED Course  I have reviewed the triage vital signs and the nursing notes.  Pertinent labs & imaging results that were available during my care of the patient were reviewed by me and considered in my medical decision making (see chart for details).  Clinical Course     I suspect the patient is constipated from her history, physical examination and recollection of onset of symptoms.  Tonight.  X-ray has been reviewed and shows a moderate amount of stool throughout the colon with normal labs.  She will be started on mural lax and allowed to follow-up with her primary care physician  Final  Clinical Impressions(s) / ED Diagnoses   Final diagnoses:  Constipation, unspecified constipation type    New Prescriptions New Prescriptions   POLYETHYLENE GLYCOL POWDER (GLYCOLAX/MIRALAX) POWDER    Take 17 g by mouth daily.     Earley FavorGail Philip Kotlyar, NP 11/10/15 [id number]    Dione Boozeavid Glick, MD 11/11/15 0321    Earley FavorGail Lovell Nuttall, NP 01/01/16 2118    Dione Boozeavid Glick, MD 01/04/16 [phone]

## 2015-11-10 NOTE — ED Notes (Signed)
Pt ambulatory and independent at discharge.  Verbalized understanding of discharge instructions 

## 2015-11-10 NOTE — Discharge Instructions (Signed)
Your x-ray shows that you're constipated.  This can cause quite a bit of discomfort in your abdomen, particularly on the left side U been given a prescription for Mira lax.  Please take this as directed with a full glass of water 1-2 times a day for the next several days.  Try to eat a diet high in fiber, no drink plenty of water.  Follow-up with your primary care physician as needed

## 2015-11-10 NOTE — ED Notes (Signed)
Patient transported to X-ray 

## 2015-11-17 ENCOUNTER — Encounter: Payer: Self-pay | Admitting: Family Medicine

## 2015-11-17 ENCOUNTER — Ambulatory Visit: Payer: Medicaid Other | Attending: Family Medicine | Admitting: Family Medicine

## 2015-11-17 VITALS — BP 105/70 | HR 83 | Temp 99.3°F | Wt 148.6 lb

## 2015-11-17 DIAGNOSIS — K5901 Slow transit constipation: Secondary | ICD-10-CM

## 2015-11-17 DIAGNOSIS — R040 Epistaxis: Secondary | ICD-10-CM | POA: Insufficient documentation

## 2015-11-17 MED ORDER — SALINE SPRAY 0.65 % NA SOLN: 1.0000 | NASAL | 0 refills | Status: DC | PRN

## 2015-11-17 MED ORDER — LACTULOSE 10 GM/15ML PO SOLN: 10.0000 g | Freq: Two times a day (BID) | ORAL | Status: DC | PRN

## 2015-11-17 MED ORDER — LACTULOSE 10 GM/15ML PO SOLN: 20.0000 g | Freq: Two times a day (BID) | ORAL | 0 refills | Status: DC | PRN

## 2015-11-17 MED FILL — LACTULOSE 10 GM/15 ML SOLN: 10 | 4 days supply | Qty: 240 | Fill #0

## 2015-11-17 NOTE — Progress Notes (Signed)
Subjective:  Patient ID: Desiree Gutierrez, female    DOB: 02/19/1991  Age: 25 y.o. MRN: [id number]  CC: Shortness of Breath   HPI [name] has hx of  POTs syndrome, hx of WPW s/p ablation (2011)  recurrent syncope  presents for   1. Recurrent syncope: episode was last week. Was at home alone. Associated with CP, SOB, fatigue. She feels that the associated symptoms are getting worse. She is followed by Cardiology, at this point [facility] electrophys[facility] specialist.   2. LLQ pain: improved with stool softener. Has some constipation. Takes iron. miralax does not help with constipation.   Social History  Substance Use Topics  . Smoking status: Never Smoker  . Smokeless tobacco: Never Used  . Alcohol use No   Outpatient Medications Prior to Visit  Medication Sig Dispense Refill  . Ascorbic Acid (VITAMIN C) 1000 MG tablet Take 1,000 mg by mouth daily.    . Calcium-Magnesium-Vitamin D (CALCIUM MAGNESIUM PO) Take 2 tablets by mouth daily.    . Cholecalciferol (D-5000) 5000 UNITS TABS Take 5,000 Units by mouth daily.    . Coenzyme Q10 (COQ10 PO) Take 1 tablet by mouth daily.    . Cyanocobalamin (RA VITAMIN B-12 TR) 1000 MCG TBCR Take 1,000 mcg by mouth daily.    Marland Kitchen. D-Ribose (RIBOSE, D,) POWD Take 200 g by mouth daily.    Marland Kitchen. docusate sodium (COLACE) 100 MG capsule Take 100 mg by mouth daily.    . ferrous gluconate (FERGON) 324 MG tablet Take 324 mg by mouth daily with breakfast.    . ibuprofen (ADVIL,MOTRIN) 200 MG tablet Take 400 mg by mouth 2 (two) times daily as needed for headache, moderate pain or cramping.     . Levocarnitine HCl POWD Take 500 mg by mouth daily.     . Omega-3 1000 MG CAPS Take 1 capsule by mouth daily.    . polyethylene glycol powder (GLYCOLAX/MIRALAX) powder Take 17 g by mouth daily. 255 g 0  . Probiotic Product (PROBIOTIC PO) Take 1 tablet by mouth daily.    . sodium chloride 1 G tablet Take 1 g by mouth 2 (two) times  daily.     Facility-Administered Medications Prior to Visit  Medication Dose Route Frequency Provider Last Rate Last Dose  . gi cocktail (Maalox,Lidocaine,Donnatal)  30 mL Oral Once Jsiah Menta, MD        ROS Review of Systems  Constitutional: Positive for appetite change (always feels hungry and never satisfied ) and fatigue. Negative for chills and fever.  HENT: Positive for nosebleeds.   Eyes: Negative for visual disturbance.  Respiratory: Positive for shortness of breath.   Cardiovascular: Positive for chest pain and palpitations.  Gastrointestinal: Positive for nausea and vomiting. Negative for abdominal pain and blood in stool.  Musculoskeletal: Negative for arthralgias and back pain.  Skin: Positive for color change. Negative for rash.  Allergic/Immunologic: Negative for immunocompromised state.  Neurological: Positive for syncope.  Hematological: Negative for adenopathy. Does not bruise/bleed easily.  Psychiatric/Behavioral: Negative for dysphoric mood and suicidal ideas.    Objective:  BP 105/70 (BP Location: Right Arm, Patient Position: Sitting, Cuff Size: Large)   Pulse 83   Temp 99.3 F (37.4 C) (Oral)   Wt 148 lb 9.6 oz (67.4 kg)   LMP 10/31/2015   SpO2 100%   BMI 26.32 kg/m   BP/Weight 11/17/2015 11/10/2015 11/05/2015  Systolic BP 105 100 100  Diastolic BP 70 60  67  Wt. (Lbs) 148.6 - -  BMI 26.32 - -   Physical Exam  Constitutional: She is oriented to person, place, and time. She appears well-developed and well-nourished. No distress.  HENT:  Head: Normocephalic and atraumatic.  Nose: Mucosal edema present.  Cardiovascular: Normal rate, regular rhythm, normal heart sounds and intact distal pulses.   Pulmonary/Chest: Effort normal and breath sounds normal.  Abdominal: Soft. Bowel sounds are normal. She exhibits no distension. There is no tenderness. There is no rebound and no guarding.  Musculoskeletal: She exhibits no edema.  Neurological: She is alert  and oriented to person, place, and time.  Skin: Skin is warm and dry. No rash noted.  Psychiatric: She has a normal mood and affect.    Assessment & Plan:   There are no diagnoses linked to this encounter. Shekinah was seen today for shortness of breath, fatigue and chest pain.  Diagnoses and all orders for this visit:  Slow transit constipation -     Discontinue: lactulose (CHRONULAC) 10 GM/15ML solution 10 g; Take 15 mLs (10 g total) by mouth 2 (two) times daily as needed for mild constipation. -     lactulose (CHRONULAC) 10 GM/15ML solution; Take 30 mLs (20 g total) by mouth 2 (two) times daily as needed for mild constipation.  Frequent nosebleeds -     sodium chloride (OCEAN) 0.65 % SOLN nasal spray; Place 1 spray into both nostrils as needed for congestion.   Meds ordered this encounter  Medications  . DISCONTD: lactulose (CHRONULAC) 10 GM/15ML solution 10 g  . sodium chloride (OCEAN) 0.65 % SOLN nasal spray    Sig: Place 1 spray into both nostrils as needed for congestion.    Dispense:  30 mL    Refill:  0  . lactulose (CHRONULAC) 10 GM/15ML solution    Sig: Take 30 mLs (20 g total) by mouth 2 (two) times daily as needed for mild constipation.    Dispense:  240 mL    Refill:  0    Follow-up: Return in about 3 months (around 02/17/2016) for syncope, chest pains .   Desiree PhiJosalyn Naveen Lorusso MD

## 2015-11-17 NOTE — Patient Instructions (Addendum)
Briunna was seen today for shortness of breath, fatigue and chest pain.  Diagnoses and all orders for this visit:  Slow transit constipation -     Discontinue: lactulose (CHRONULAC) 10 GM/15ML solution 10 g; Take 15 mLs (10 g total) by mouth 2 (two) times daily as needed for mild constipation. -     lactulose (CHRONULAC) 10 GM/15ML solution; Take 30 mLs (20 g total) by mouth 2 (two) times daily as needed for mild constipation.  Frequent nosebleeds -     sodium chloride (OCEAN) 0.65 % SOLN nasal spray; Place 1 spray into both nostrils as needed for congestion.   Stop miralax Add lactulose for constipation   F/u in 2-4 weeks for flu shot with flu clinic  F/u with me in 3 months sooner if needed   Dr. Armen PickupFunches

## 2015-12-14 ENCOUNTER — Ambulatory Visit: Payer: Medicaid Other | Admitting: Internal Medicine

## 2015-12-31 ENCOUNTER — Telehealth: Payer: Self-pay | Admitting: Cardiovascular Disease

## 2015-12-31 NOTE — Telephone Encounter (Signed)
Closed encounter °

## 2016-01-05 ENCOUNTER — Ambulatory Visit: Payer: Medicaid Other | Admitting: Cardiovascular Disease

## 2016-01-28 ENCOUNTER — Encounter: Payer: Self-pay | Admitting: Cardiovascular Disease

## 2016-01-28 ENCOUNTER — Ambulatory Visit (INDEPENDENT_AMBULATORY_CARE_PROVIDER_SITE_OTHER): Payer: Medicaid Other | Admitting: Cardiovascular Disease

## 2016-01-28 VITALS — Ht 63.0 in | Wt 152.0 lb

## 2016-01-28 DIAGNOSIS — R000 Tachycardia, unspecified: Secondary | ICD-10-CM

## 2016-01-28 DIAGNOSIS — R55 Syncope and collapse: Secondary | ICD-10-CM

## 2016-01-28 DIAGNOSIS — I951 Orthostatic hypotension: Secondary | ICD-10-CM

## 2016-01-28 DIAGNOSIS — G90A Postural orthostatic tachycardia syndrome (POTS): Secondary | ICD-10-CM

## 2016-01-28 NOTE — Progress Notes (Signed)
Cardiology Office Note   Date:  01/28/2016   ID:  Desiree Gutierrez, DOB 04/08/1990, MRN [id number]  PCP:  Lora Paula, MD  Cardiologist:   Chilton Si, MD   Chief Complaint  Patient presents with  . Follow-up    New patient.   . Shortness of Breath  . Dizziness  . Chest Pain     History of Present Illness: Desiree Gutierrez is a 25 y.o. female with WPW s/p ablation, POTS and vasovagal syncope who presents for an evaluation of recurrent syncope.  Desiree Gutierrez report recurrent episodes of syncope that began in 2011.  The episodes occur both at rest and with exertion.  She has very little warning before passing out.  She is unconscious for 3-7 minutes and is mildly confused for a few minutes after the event.  There is no shaking, loss of bowel or bladder.  It has occurred while driving so she no longer drives.  She sometimes feels her heart racing, gets short of breath and has chest pressure prior to passing out. At other times there is no preceding chest pain, palpitations or shortness of breath.  At its worst, she has a syncopal episode every other day.  She has not experienced any events in the last three weeks, which is the longest she can remember since 2011.  The only difference is that she has started taking new vitamins.  She also reports episodes of waking up with her heart racing and with chest pain.  She feels best when she exercise, but she has experienced syncope at the gym.  She notes that the episodes are less frequent when she is exercising regularly.  Desiree Gutierrez has been evaluated extensively at [facility], [place], [place] and [place].  She is awaiting an evaluation at the [facility].   She has undergone several Tilt Table Tests and was diagnosed with vasovagal syncope.,  She has failed Florinef, midodrine, propranolol, citalopram, carvedilol and ivabradine.  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  She had  an EP study 04/2015 that demonstrated dual AV node physiology but no inducible arrhythmia and no evidence of an active accessory pathway with adenosine.  She did undergo ablation for WPW (L posterior pathway) in 2011.    [name] reports that she drinks 64 oz of fluid daily.  She continues to use salt tablets and adds extra salt to her food.  She does not wear compression stocking.   She notes that her symptoms are worse when the weather is hot out.   She recently saw Dr. Donnetta Simpers on 01/14/16 and was noted to be volume overloaded.  She was referred for an echo that revealed normal systolic function and mild tricuspid regurgitation.   Past Medical History:  Diagnosis Date  . Headache   . Palpitations    a. s/p MDT ILR implanted by Dr Alfonso Ellis [facility])  . POTS (postural orthostatic tachycardia syndrome)   . Vasovagal syncope    a. positive tilt 2011 b. failed medical therapy with Florinef, Midodrine, Inderal, Celexa  . WPW (Wolff-Parkinson-White syndrome)    a. s/p ablation (2011) Dr Gevena Cotton at [facility] (posterior lateral pathway)    Past Surgical History:  Procedure Laterality Date  . CARDIAC ELECTROPHYSIOLOGY STUDY AND ABLATION  2011   Dr. Gevena Cotton at [facility]- left posterior pathway ablation for WPW  . LOOP RECORDER IMPLANT  01/22/2014   MDT LINQ implanted by Dr Alfonso Ellis  at [facility] for evaluation of palpitations/syncope  . TILT TABLE STUDY  2011   neurally mediated syncope     Current Outpatient Prescriptions  Medication Sig Dispense Refill  . Ascorbic Acid (VITAMIN C) 1000 MG tablet Take 1,000 mg by mouth daily.    . Calcium-Magnesium-Vitamin D (CALCIUM MAGNESIUM PO) Take 2 tablets by mouth daily.    . Cholecalciferol (D-5000) 5000 UNITS TABS Take 5,000 Units by mouth daily.    . Coenzyme Q10 (COQ10 PO) Take 1 tablet by mouth daily.    . Cyanocobalamin (RA VITAMIN B-12 TR) 1000 MCG TBCR Take 1,000 mcg by mouth daily.    Marland Kitchen D-Ribose (RIBOSE, D,) POWD Take 200 g by mouth daily.    .  ferrous gluconate (FERGON) 324 MG tablet Take 324 mg by mouth daily with breakfast.    . ibuprofen (ADVIL,MOTRIN) 200 MG tablet Take 400 mg by mouth 2 (two) times daily as needed for headache, moderate pain or cramping.     . lactulose (CHRONULAC) 10 GM/15ML solution Take 30 mLs (20 g total) by mouth 2 (two) times daily as needed for mild constipation. 240 mL 0  . Omega-3 1000 MG CAPS Take 1 capsule by mouth daily.    . Probiotic Product (PROBIOTIC PO) Take 1 tablet by mouth daily.    . sodium chloride 1 G tablet Take 1 g by mouth 2 (two) times daily.     No current facility-administered medications for this visit.     Allergies:   Amitiza [lubiprostone]; Other; Ketorolac; Metoclopramide; Midodrine; and Fludrocortisone    Social History:  The patient  reports that she has never smoked. She has never used smokeless tobacco. She reports that she does not drink alcohol or use drugs.   Family History:  The patient's family history includes Diabetes in her mother; Hypertension in her brother and mother; Stroke in her mother.    ROS:  Please see the history of present illness.   Otherwise, review of systems are positive for none.   All other systems are reviewed and negative.    PHYSICAL EXAM: VS:  Ht 5\' 3"  (1.6 m)   Wt 68.9 kg (152 lb)   BMI 26.93 kg/m  , BMI Body mass index is 26.93 kg/m. GENERAL:  Well appearing HEENT:  Pupils equal round and reactive, fundi not visualized, oral mucosa unremarkable NECK:  No jugular venous distention, waveform within normal limits, carotid upstroke brisk and symmetric, no bruits, no thyromegaly LYMPHATICS:  No cervical adenopathy LUNGS:  Clear to auscultation bilaterally HEART:  RRR.  PMI not displaced or sustained,S1 and S2 within normal limits, no S3, no S4, no clicks, no rubs, no murmurs ABD:  Flat, positive bowel sounds normal in frequency in pitch, no bruits, no rebound, no guarding, no midline pulsatile mass, no hepatomegaly, no splenomegaly EXT:   2 plus pulses throughout, no edema, no cyanosis no clubbing SKIN:  No rashes no nodules NEURO:  Cranial nerves II through XII grossly intact, motor grossly intact throughout PSYCH:  Cognitively intact, oriented to person place and time    EKG:  EKG is not ordered today. The ekg ordered 11/06/15 demonstrates sinus rhythm rate 92 bpm  Echo 03/15/15 SUMMARY Results from these images listed in other report on this same day. In brief, The left ventricular size is normal. No segmental wall motion abnormalities seen in the left ventricle  Left ventricular filling pattern is normal. The right ventricle is normal in size and function. The left atrial size is normal.  Right atrial size is normal. The mitral valve is normal in structure and function. There is mild tricuspid regurgitation. Trace pulmonic valvular regurgitation. There is trivial pericardial effusion. There is no comparison study available.  48 Holter 06/28/12 Sinus rhythm, sinus tach, sinus arrhythmia - One PVC - One PAC - no pauses  Tilt - 03/03/11 - Neurocardiogenic syncope  Tilt - 01/16/2014 - Negative for neurocardiogenic syncope  CP Stress 06/22/15 Interpretation  Notes:Patient cooperated as well as possible despite symptoms of pre-syncope. Pulse oximetry remained 98% or above throughout the exercise. Exercise was performed on a cycle ergometer starting at [facility]W and increasing by 15W/min.  ZOX:WRUEAVWECG:Resting ECG in sinus tachycardia with sinus arrhythmia. The HR was mildly inadequate. There were no sustained arrhythmias and no ST-T changes. The BP response was appropriate.   PFT:Pre-exercise spirometry within normal limits. MVV low-normal. Post-exercise spirometry not performed for EIB due to symptoms of pre-syncope.   CPX:The RER of 1.13 indicates a maximal effort. The peak VO2 is mildly reduced at 19.5 ml/kg/min (62% of the age/gender/weight matched sedentary norm). When adjusted to the patients ideal  body weight of 135 lb (61.2 kg) the peak VO2 is 20.3 ml/kg (ibw)/min (63% of the IBW adjusted predicted). The VO2 at the ventilatory threshold is below normal at 36% of the predicted peak VO2. At peak exercise the ventilation was 54% of the measured MVV, indicating ventilatory reserve remained. As mentioned above there was an inadequate HR response to the exercise with a HR reserve of 36 bpm. The O2pulse (a surrogate for stroke volume) increased with exercise initially with early plateau at 8 ml/beat (80% of predicted). The VE/VCO2 slope is normal. The oxygen uptake efficiency slope (OUES) is normal and does not directly reflect the patient's measured functional capacity.  Conclusion: Exercise testing with gas exchange demonstrates a mild functional impairment when compared to matched sedentary norms. There was mild chronotropic incompetence in addition to poor measured PVO2 and low, with early flattening O2/pulse. There are no clear ventilatory limtiation, however there is mild evidence of circulatory limitations which could be due to diastolic dysfunction in combination with arrhythmia and suspected POTS. In addition, patient is likely poorly conditioned due to persistent syncope.       Echo  LVEF 65-70%.  Mild TR.  Recent Labs: 04/19/2015: Magnesium 2.1 11/10/2015: ALT 13; BUN 10; Creatinine, Ser 0.65; Hemoglobin 12.3; Platelets 250; Potassium 3.7; Sodium 140    Lipid Panel No results found for: CHOL, TRIG, HDL, CHOLHDL, VLDL, LDLCALC, LDLDIRECT    Wt Readings from Last 3 Encounters:  01/28/16 68.9 kg (152 lb)  11/17/15 67.4 kg (148 lb 9.6 oz)  11/04/15 64.4 kg (142 lb)      ASSESSMENT AND PLAN:  # Recurrent syncope: Desiree Gutierrez has debilitating recurrent syncope.  This has significantly altered her lifestyle.  Fortunately she has not had any episodes in the last three weeks.  She has been diagnosed with POTS and neurocaridogenic syncope.  She has worn event monitors  that showed sinus tachycardia at the time of her syncope.  She has failed multiple agents, worn multiple monitors, had an ILR and EPS.  Unfortuantely she continues to have symptoms.  She seems to be convinced that her symptoms are attributable.  I spent 45 minutes with her and her god father explaining WPW, dual AV node physiology, neurocardiogenic syncope and POTS. Unfortunately, I did not have any new pharmacologic recommendations.  We did discuss the importance of hydration and wearing her compression stockings.  She is very  deconditioned and felt better when she was able to exercise.  We will work on getting her enrolled in cardiac rehab so that she can exercise in a supervised environment.  She will continue to await an appointment at the [facility] syncope clinic.  If she has recurrent clinic appointments, she should be seen by an electrophysiologist. I offered to schedule a follow up appointment with EP and she declined.    Current medicines are reviewed at length with the patient today.  The patient does not have concerns regarding medicines.  The following changes have been made:  no change  Labs/ tests ordered today include:  No orders of the defined types were placed in this encounter.    Disposition:   FU with Desiree Gutierrez C. Duke Salvia, MD, [facility] as needed    This note was written with the assistance of speech recognition software.  Please excuse any transcriptional errors.  Signed, Reylene Stauder C. Duke Salvia, MD, [facility]  01/28/2016 2:41 PM    [facility]

## 2016-01-28 NOTE — Patient Instructions (Addendum)
Medication Instructions:  Your physician recommends that you continue on your current medications as directed. Please refer to the Current Medication list given to you today.  Labwork: none  Testing/Procedures: none  Follow-Up: As needed   Any Other Special Instructions Will Be Listed Below (If Applicable). YOU SHOULD BE HEARING FROM CARDIAC REHAB SOON IF YOU DO NOT CALL Shandricka Monroy AT [phone]  If you need a refill on your cardiac medications before your next appointment, please call your pharmacy.

## 2016-02-08 ENCOUNTER — Telehealth: Payer: Self-pay | Admitting: *Deleted

## 2016-02-08 ENCOUNTER — Ambulatory Visit: Payer: Medicaid Other | Attending: Family Medicine | Admitting: Family Medicine

## 2016-02-08 ENCOUNTER — Telehealth: Payer: Self-pay | Admitting: Family Medicine

## 2016-02-08 ENCOUNTER — Encounter: Payer: Self-pay | Admitting: Family Medicine

## 2016-02-08 VITALS — BP 105/70 | HR 87 | Temp 98.7°F | Ht 63.0 in | Wt 152.4 lb

## 2016-02-08 DIAGNOSIS — R55 Syncope and collapse: Secondary | ICD-10-CM | POA: Insufficient documentation

## 2016-02-08 DIAGNOSIS — D509 Iron deficiency anemia, unspecified: Secondary | ICD-10-CM | POA: Insufficient documentation

## 2016-02-08 DIAGNOSIS — R0602 Shortness of breath: Secondary | ICD-10-CM | POA: Diagnosis not present

## 2016-02-08 DIAGNOSIS — R000 Tachycardia, unspecified: Secondary | ICD-10-CM | POA: Diagnosis not present

## 2016-02-08 DIAGNOSIS — Z79899 Other long term (current) drug therapy: Secondary | ICD-10-CM | POA: Insufficient documentation

## 2016-02-08 DIAGNOSIS — I456 Pre-excitation syndrome: Secondary | ICD-10-CM | POA: Diagnosis not present

## 2016-02-08 DIAGNOSIS — R079 Chest pain, unspecified: Secondary | ICD-10-CM | POA: Insufficient documentation

## 2016-02-08 DIAGNOSIS — I951 Orthostatic hypotension: Secondary | ICD-10-CM | POA: Diagnosis not present

## 2016-02-08 DIAGNOSIS — G90A Postural orthostatic tachycardia syndrome (POTS): Secondary | ICD-10-CM

## 2016-02-08 DIAGNOSIS — R002 Palpitations: Secondary | ICD-10-CM | POA: Diagnosis not present

## 2016-02-08 MED ORDER — FERROUS GLUCONATE 324 (38 FE) MG PO TABS: 324.0000 mg | ORAL_TABLET | Freq: Every day | ORAL | 3 refills | Status: DC

## 2016-02-08 NOTE — Progress Notes (Signed)
Pt is here to follow up on chest pains and SOB. Pt states that she is still experiencing chest pains and SOB.  Pt needs refill on ferrous gluconate.  Pt declined flu shot.

## 2016-02-08 NOTE — Assessment & Plan Note (Signed)
Recurrent syncope with suspected POTS hx of WP s/p ablation [facility] Patient's referral is being processed with a financial advocate, patient has [place] Medicaid Called the financial advocate and left VM requesting call back

## 2016-02-08 NOTE — Progress Notes (Signed)
Subjective:  Patient ID: Desiree Gutierrez, female    DOB: [birth year]  Age: 25 y.o. MRN: [id number]  CC: No chief complaint on file.   HPI [name] has hx of  POTs syndrome, hx of WPW s/p ablation (2011)  recurrent syncope  presents for   1. Recurrent syncope in setting of suspected POTS: last episode of syncope was 3 months ago. She is still having palpitations, shortness of breath and chest pain. These symptoms are getting worse. She is followed by Cardiology, Dr. Gelene Mink at [facility]. Her last visit with Dr. Gelene Mink was on 01/14/2016. She was ordered to have another ECHO, which was normal except for trace/mild tricupsid, mitral and pulmonic valve regurgitation and Holter monitoring. She recently met with Dr. Skeet Latch local cardiologist on 01/28/16.  At this point she has been referred to [facility] to see an electrophysiology/syncope specialist. This referral was made following her July 09, 2015 office visit with Dr. Gelene Mink.   Social History  Substance Use Topics  . Smoking status: Never Smoker  . Smokeless tobacco: Never Used  . Alcohol use No   Outpatient Medications Prior to Visit  Medication Sig Dispense Refill  . Ascorbic Acid (VITAMIN C) 1000 MG tablet Take 1,000 mg by mouth daily.    . Calcium-Magnesium-Vitamin D (CALCIUM MAGNESIUM PO) Take 2 tablets by mouth daily.    . Cholecalciferol (D-5000) 5000 UNITS TABS Take 5,000 Units by mouth daily.    . Coenzyme Q10 (COQ10 PO) Take 1 tablet by mouth daily.    . Cyanocobalamin (RA VITAMIN B-12 TR) 1000 MCG TBCR Take 1,000 mcg by mouth daily.    Marland Kitchen D-Ribose (RIBOSE, D,) POWD Take 200 g by mouth daily.    . ferrous gluconate (FERGON) 324 MG tablet Take 324 mg by mouth daily with breakfast.    . ibuprofen (ADVIL,MOTRIN) 200 MG tablet Take 400 mg by mouth 2 (two) times daily as needed for headache, moderate pain or cramping.     . lactulose (CHRONULAC) 10 GM/15ML solution Take 30 mLs (20 g total) by mouth 2 (two) times  daily as needed for mild constipation. 240 mL 0  . Omega-3 1000 MG CAPS Take 1 capsule by mouth daily.    . Probiotic Product (PROBIOTIC PO) Take 1 tablet by mouth daily.    . sodium chloride 1 G tablet Take 1 g by mouth 2 (two) times daily.     No facility-administered medications prior to visit.     ROS Review of Systems  Constitutional: Positive for appetite change (always feels hungry and never satisfied ) and fatigue. Negative for chills and fever.  HENT: Negative for nosebleeds.   Eyes: Negative for visual disturbance.  Respiratory: Positive for shortness of breath.   Cardiovascular: Positive for chest pain and palpitations.  Gastrointestinal: Negative for abdominal pain, blood in stool, nausea and vomiting.  Musculoskeletal: Negative for arthralgias and back pain.  Skin: Positive for color change. Negative for rash.  Allergic/Immunologic: Negative for immunocompromised state.  Neurological: Negative for syncope.  Hematological: Negative for adenopathy. Does not bruise/bleed easily.  Psychiatric/Behavioral: Negative for dysphoric mood and suicidal ideas.    Objective:  BP 105/70 (BP Location: Left Arm, Patient Position: Sitting, Cuff Size: Small)   Pulse 87   Temp 98.7 F (37.1 C) (Oral)   Ht _0  (1.6 m)   Wt 152 lb 6.4 oz (69.1 kg)   LMP 01/28/2016   SpO2 98%   BMI 27.00 kg/m   BP/Weight 02/08/2016 01/28/2016 11/17/2015  Systolic BP 459 - 977  Diastolic BP 70 - 70  Wt. (Lbs) 152.4 152 148.6  BMI 27 26.93 26.32   Physical Exam  Constitutional: She is oriented to person, place, and time. She appears well-developed and well-nourished. No distress.  HENT:  Head: Normocephalic and atraumatic.  Cardiovascular: Normal rate, regular rhythm, normal heart sounds and intact distal pulses.   Pulmonary/Chest: Effort normal and breath sounds normal.  Abdominal: Soft. Bowel sounds are normal. She exhibits no distension. There is no tenderness. There is no rebound and no  guarding.  Musculoskeletal: She exhibits no edema.  Neurological: She is alert and oriented to person, place, and time.  Skin: Skin is warm and dry. No rash noted.  Psychiatric: She has a normal mood and affect.    Assessment & Plan:  [name] was seen today for follow-up.  Diagnoses and all orders for this visit:  Iron deficiency anemia, unspecified iron deficiency anemia type -     ferrous gluconate (FERGON) 324 MG tablet; Take 1 tablet (324 mg total) by mouth daily with breakfast.  POTS (postural orthostatic tachycardia syndrome)  Syncope, unspecified syncope type   There are no diagnoses linked to this encounter. There are no diagnoses linked to this encounter. No orders of the defined types were placed in this encounter.   Follow-up: Return in about 3 months (around 05/10/2016) for recurrent syncope.   Boykin Nearing MD

## 2016-02-08 NOTE — Telephone Encounter (Signed)
Patient is unable to qualify for Cardiac Rehab secondary to her insurance. Referral place for the maintenance program. Desiree Gutierrez with rehab will reach out to patient to see if she will be able to participate

## 2016-02-08 NOTE — Telephone Encounter (Signed)
Called Loreene Museum/gallery conservatorlescher  Financial Advocate at [facility] [place], [place]  Called to f/u patients referral Left VM requesting a call back for update.  Arna Mediciora, will you please also f/u with Loreene and let me know if additional information is needed to process her referral

## 2016-02-08 NOTE — Patient Instructions (Addendum)
Desiree Gutierrez was seen today for follow-up.  Diagnoses and all orders for this visit:  Iron deficiency anemia, unspecified iron deficiency anemia type -     ferrous gluconate (FERGON) 324 MG tablet; Take 1 tablet (324 mg total) by mouth daily with breakfast.    Your referral was sent to a Patient Financial Advocate  name Desiree Gutierrez Phone # [phone] at the [facility] in [place], [place].  I have called and left a voicemail.  I will follow up with her via phone and ask Desiree Gutierrez (referral coordinator) to follow up with her as well.  F/u with me in 3 months   Dr. Armen PickupFunches

## 2016-02-09 NOTE — Telephone Encounter (Signed)
Received a call from [employer] Copper & Gold[employer]oreene Flescher, Patient Database administratorinancial Advocate at the [facility], on my cell phone.  She stated that patient's referral is pending, "authorization for the patient to go out of state".   She stated that she spoke with the referral coordinator, Arna Mediciora, regarding this over the summer.   Her direct call back # is [phone]

## 2016-02-10 NOTE — Telephone Encounter (Signed)
I Talked to Desiree Gutierrez from [facility] [place] she told me to contact [place] Medicaid to obtain a Auth out state  1800 [phone] option 3 . They told me that [facility] need to submit a prior approval because they are rendering the services  And Mrs Desiree GroveLaureen is aware of that and she will contact Medicaid  Call reference # [id number] and also I will fax the records again  to [phone]

## 2016-02-11 NOTE — Telephone Encounter (Signed)
Thank you very much Arna Mediciora Please keep me posted

## 2016-03-01 ENCOUNTER — Emergency Department (HOSPITAL_COMMUNITY)
Admission: EM | Admit: 2016-03-01 | Discharge: 2016-03-01 | Disposition: A | Discharge status: Home / Self Care | Payer: Medicaid Other | Attending: Emergency Medicine | Admitting: Emergency Medicine

## 2016-03-01 DIAGNOSIS — Z79899 Other long term (current) drug therapy: Secondary | ICD-10-CM | POA: Diagnosis not present

## 2016-03-01 DIAGNOSIS — R55 Syncope and collapse: Secondary | ICD-10-CM | POA: Insufficient documentation

## 2016-03-01 NOTE — ED Notes (Signed)
PT DISCHARGED. INSTRUCTIONS GIVEN. AAOX4. PT IN NO APPARENT DISTRESS OR PAIN. THE OPPORTUNITY TO ASK QUESTIONS WAS PROVIDED. 

## 2016-03-01 NOTE — ED Notes (Signed)
Pt ambulated from EMS with NAD noted. Pt reports feeling dizzy and nauseous.

## 2016-03-01 NOTE — ED Triage Notes (Signed)
Per EMS, pt is from home. Pt reports passing out and denies any injuries. Pt denies any pain and dizziness. Pt reports that this has happened in the past. Pt reports being seen by a cardiologist for SVT in and provider didn't recommend any medications.

## 2016-03-01 NOTE — ED Notes (Signed)
Ambulated Pt: No assistance and stable

## 2016-03-01 NOTE — ED Provider Notes (Signed)
[facility]-EMERGENCY DEPT Provider Note   CSN: [id number] Arrival date & time: 03/01/16  1030     History   Chief Complaint Chief Complaint  Patient presents with  . Near Syncope    HPI Desiree Gutierrez is a 25 y.o. female.  Patient is a 25 year old female who has multiple visits for similar episodes. 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 She's followed by a cardiologist, Dr. Cristi LoronBeatty at [facility]. She presents today with a syncopal episode. She states she woke up and felt like her heart was racing but hasn't normalized. She then had a syncopal episode. This is typical episode for her. There wasn't anything unusual about it other than she had some shortness of breath associated with it and felt like her throat was tight. The symptoms have resolved and she feels back to baseline now. She states she had a Holter monitor placed within the last 2 months. She also has been referred to [facility] but does not yet have an appointment. She denies any fevers cough colds symptoms, urinary symptoms or other recent illnesses. She denies any chest pain today. She denies any injuries from the fall.      Past Medical History:  Diagnosis Date  . Headache   . Palpitations    a. s/p MDT ILR implanted by Dr Alfonso EllisBeaty [facility]  . POTS (postural orthostatic tachycardia syndrome)   . Vasovagal syncope    a. positive tilt 2011 b. failed medical therapy with Florinef, Midodrine, Inderal, Celexa  . WPW (Wolff-Parkinson-White syndrome)    a. s/p ablation (2011) [facility]immern [facility] (posterior lateral pathway)    Patient Active Problem List   Diagnosis Date Noted  . Purpura (HCC) 10/15/2015  . Dyspnea and respiratory abnormality 09/08/2015  . Diastolic dysfunction 09/08/2015  . Mold exposure 08/10/2015  . Vocal cord dysfunction 06/02/2015  . Stress at home 05/22/2015  . SOB (shortness of breath) 04/30/2015  . Pain of  molar 02/13/2015  . Observed seizure-like activity (HCC) 02/02/2015  . Fall   . Hypoglycemia   . POTS (postural orthostatic tachycardia syndrome) 06/01/2014  . Iron deficiency anemia 06/01/2014  . Tachycardia 05/29/2014  . Syncope 12/03/2010  . WOLFF-PARKINSON-WHITE (WPW) SYNDROME status post ablation 08/20/2009  . Constipation 08/19/2009  . Chest pain 08/19/2009    Past Surgical History:  Procedure Laterality Date  . CARDIAC ELECTROPHYSIOLOGY STUDY AND ABLATION  2011   [facility]- left posterior pathway ablation for WPW  . LOOP RECORDER IMPLANT  01/22/2014   MDT LINQ [facility] of palpitations/syncope  . TILT TABLE STUDY  2011   neurally mediated syncope    OB History    No data available       Home Medications    Prior to Admission medications   Medication Sig Start Date End Date Taking? Authorizing Provider  Ascorbic Acid (VITAMIN C) 1000 MG tablet Take 1,000 mg by mouth daily.   Yes Historical Provider, MD  Calcium-Magnesium-Vitamin D (CALCIUM MAGNESIUM PO) Take 2 tablets by mouth daily.   Yes Historical Provider, MD  Cholecalciferol (D-5000) 5000 UNITS TABS Take 5,000 Units by mouth daily.   Yes Historical Provider, MD  Coenzyme Q10 (COQ10 PO) Take 1 tablet by mouth daily.   Yes Historical Provider, MD  Cyanocobalamin (RA VITAMIN B-12 TR) 1000 MCG TBCR Take 1,000 mcg by mouth daily.   Yes Historical Provider, MD  D-Ribose (RIBOSE, D,) POWD Take 200 g by mouth daily.   Yes Historical Provider, MD  ferrous gluconate (FERGON) 324 MG tablet Take 1 tablet (324 mg total) by mouth daily with breakfast. 02/08/16  Yes Josalyn Funches, MD  ibuprofen (ADVIL,MOTRIN) 200 MG tablet Take 400 mg by mouth 2 (two) times daily as needed for headache, moderate pain or cramping.    Yes Historical Provider, MD  Multiple Vitamin (MULTIVITAMIN WITH MINERALS) TABS tablet Take 1 tablet by mouth daily.   Yes Historical Provider, MD  Omega-3 1000 MG  CAPS Take 1 capsule by mouth daily.   Yes Historical Provider, MD  Probiotic Product (PROBIOTIC PO) Take 1 tablet by mouth daily.   Yes Historical Provider, MD  sodium chloride 1 G tablet Take 1 g by mouth 2 (two) times daily.   Yes Historical Provider, MD  lactulose (CHRONULAC) 10 GM/15ML solution Take 30 mLs (20 g total) by mouth 2 (two) times daily as needed for mild constipation. Patient not taking: Reported on 03/01/2016 11/17/15   Dessa PhiJosalyn Funches, MD    Family History Family History  Problem Relation Age of Onset  . Diabetes Mother   . Hypertension Mother   . Stroke Mother   . Hypertension Brother   . Hyperlipidemia Maternal Grandmother   . Seizures Neg Hx     Social History Social History  Substance Use Topics  . Smoking status: Never Smoker  . Smokeless tobacco: Never Used  . Alcohol use No     Allergies   Amitiza [lubiprostone]; Other; Ketorolac; Metoclopramide; Midodrine; and Fludrocortisone   Review of Systems Review of Systems  Constitutional: Negative for chills, diaphoresis, fatigue and fever.  HENT: Positive for trouble swallowing. Negative for congestion, rhinorrhea and sneezing.   Eyes: Negative.   Respiratory: Positive for shortness of breath. Negative for cough and chest tightness.   Cardiovascular: Positive for palpitations. Negative for chest pain and leg swelling.  Gastrointestinal: Negative for abdominal pain, blood in stool, diarrhea, nausea and vomiting.  Genitourinary: Negative for difficulty urinating, flank pain, frequency and hematuria.  Musculoskeletal: Negative for arthralgias and back pain.  Skin: Negative for rash.  Neurological: Positive for syncope. Negative for dizziness, speech difficulty, weakness, numbness and headaches.     Physical Exam Updated Vital Signs BP 112/77   Pulse 85   Temp 97.9 F (36.6 C)   Resp 16   Ht 5\' 3"  (1.6 m)   Wt 142 lb (64.4 kg)   LMP 01/28/2016   SpO2 100%   BMI 25.15 kg/m   Physical Exam    Constitutional: She is oriented to person, place, and time. She appears well-developed and well-nourished.  HENT:  Head: Normocephalic and atraumatic.  Eyes: Pupils are equal, round, and reactive to light.  Neck: Normal range of motion. Neck supple.  Cardiovascular: Normal rate, regular rhythm and normal heart sounds.   Pulmonary/Chest: Effort normal and breath sounds normal. No respiratory distress. She has no wheezes. She has no rales. She exhibits no tenderness.  Abdominal: Soft. Bowel sounds are normal. There is no tenderness. There is no rebound and no guarding.  Musculoskeletal: Normal range of motion. She exhibits no edema.  Lymphadenopathy:    She has no cervical adenopathy.  Neurological: She is alert and oriented to person, place, and time.  Motor 5/5 all extremities Sensation grossly intact to LT all extremities Finger to Nose intact, no pronator drift CN II-XII grossly intact    Skin: Skin is warm and dry. No rash noted.  Psychiatric:  She has a normal mood and affect.     ED Treatments / Results  Labs (all labs ordered are listed, but only abnormal results are displayed) Labs Reviewed - No data to display  EKG  EKG Interpretation  Date/Time:  Tuesday March 01 2016 10:45:26 EST Ventricular Rate:  83 PR Interval:    QRS Duration: 80 QT Interval:  350 QTC Calculation: 412 R Axis:   83 Text Interpretation:  Sinus rhythm since last tracing no significant change Confirmed by Ivett Luebbe  MD, Chikita Dogan ([id number]) on 03/01/2016 11:57:06 AM       Radiology No results found.  Procedures Procedures (including critical care time)  Medications Ordered in ED Medications - No data to display   Initial Impression / Assessment and Plan / ED Course  I have reviewed the triage vital signs and the nursing notes.  Pertinent labs & imaging results that were available during my care of the patient were reviewed by me and considered in my medical decision making (see chart for  details).  Clinical Course     Patient's ED care plan was reviewed. She's had multiple visits for similar episodes. Her EKG is not concerning. She feels back to baseline now. She was discharged home and instructed to follow-up with her cardiologist.  Final Clinical Impressions(s) / ED Diagnoses   Final diagnoses:  Syncope and collapse    New Prescriptions New Prescriptions   No medications on file     Rolan Bucco, MD 03/01/16 1239

## 2016-03-01 NOTE — ED Notes (Signed)
PT EATING AND DRINKING W/O VOMITING.

## 2016-03-01 NOTE — ED Notes (Signed)
Bed: WA22 Expected date:  Expected time:  Means of arrival:  Comments: EMS-syncope 

## 2016-03-04 ENCOUNTER — Telehealth (HOSPITAL_COMMUNITY): Payer: Self-pay | Admitting: *Deleted

## 2016-03-04 ENCOUNTER — Encounter (HOSPITAL_COMMUNITY): Admission: RE | Admit: 2016-03-04 | Payer: Self-pay | Source: Ambulatory Visit

## 2016-03-07 ENCOUNTER — Encounter (HOSPITAL_COMMUNITY): Payer: Self-pay

## 2016-03-09 ENCOUNTER — Encounter (HOSPITAL_COMMUNITY): Payer: Self-pay

## 2016-03-11 ENCOUNTER — Encounter (HOSPITAL_COMMUNITY): Payer: Self-pay

## 2016-03-14 ENCOUNTER — Encounter (HOSPITAL_COMMUNITY): Payer: Self-pay

## 2016-03-16 ENCOUNTER — Encounter (HOSPITAL_COMMUNITY): Payer: Self-pay

## 2016-03-18 ENCOUNTER — Encounter (HOSPITAL_COMMUNITY): Payer: Self-pay

## 2016-03-21 ENCOUNTER — Encounter (HOSPITAL_COMMUNITY): Payer: Self-pay

## 2016-03-23 ENCOUNTER — Encounter (HOSPITAL_COMMUNITY): Payer: Self-pay

## 2016-03-24 ENCOUNTER — Telehealth: Payer: Self-pay | Admitting: Family Medicine

## 2016-03-24 NOTE — Telephone Encounter (Signed)
Good Afternoon Dr Armen PickupFunches  [facility] [phone] to follow up patient Cardiology referral from 02-11-16 she told me that she contact Medicaid reference call  # [id number] and they told her that reference # below to another patient but is because she wrote the number  wrong and she said that they told her that [facility] has to process the prior auth because is out of state visit and told her nicely if she can call back with the new reference # so they can explain to her that [facility] need to process that and she said no that she refuse to call and I have to deal  with that and contact medicaid again . Medicaid told me that [facility] need to do the prior authorization because they are rendering the service .

## 2016-03-25 ENCOUNTER — Encounter (HOSPITAL_COMMUNITY): Payer: Self-pay

## 2016-03-30 ENCOUNTER — Encounter (HOSPITAL_COMMUNITY): Payer: Self-pay

## 2016-04-01 ENCOUNTER — Encounter (HOSPITAL_COMMUNITY): Payer: Self-pay

## 2016-04-06 ENCOUNTER — Encounter (HOSPITAL_COMMUNITY): Payer: Self-pay

## 2016-04-06 ENCOUNTER — Telehealth: Payer: Self-pay | Admitting: Family Medicine

## 2016-04-06 NOTE — Telephone Encounter (Signed)
Pt states she has had chest pain progressing to more weakness and fatigue today. She feels like she is unable to do ADL's. Also stated she has been sweaty and clammy, with abdominal and chest pain. She states she has been having cold sx's since mid December.  She admits to coughing up blood tinged, dark yellow sputum and dizziness with frequent nose bleeds.  She rates constant pain 7/10 in chest.   Pt able to speak full sentences does not sound SOB or in acute distress via telephone. Denies h/o asthma. Initially patient informed that message will be given to provider to see if she may be worked in to schedule but given her cardiac history, pt was encourage to go to hospital for further eval. Damita Dunningsravia Karenann Mcgrory,RN

## 2016-04-06 NOTE — Telephone Encounter (Signed)
Patient called having chest pain since mid December Now experiencing weak and fatigue Cold symptoms Requesting appt  Transferred Desiree Gutierrez

## 2016-04-08 ENCOUNTER — Encounter (HOSPITAL_COMMUNITY): Payer: Self-pay

## 2016-04-11 ENCOUNTER — Encounter (HOSPITAL_COMMUNITY): Payer: Self-pay

## 2016-04-13 ENCOUNTER — Encounter (HOSPITAL_COMMUNITY): Payer: Self-pay

## 2016-04-15 ENCOUNTER — Encounter (HOSPITAL_COMMUNITY): Payer: Self-pay

## 2016-04-18 ENCOUNTER — Encounter (HOSPITAL_COMMUNITY): Payer: Self-pay

## 2016-04-19 ENCOUNTER — Emergency Department (HOSPITAL_COMMUNITY)
Admission: EM | Admit: 2016-04-19 | Discharge: 2016-04-19 | Disposition: A | Discharge status: Home / Self Care | Payer: Medicaid Other | Attending: Emergency Medicine | Admitting: Emergency Medicine

## 2016-04-19 ENCOUNTER — Emergency Department (HOSPITAL_COMMUNITY): Payer: Medicaid Other

## 2016-04-19 DIAGNOSIS — Z79899 Other long term (current) drug therapy: Secondary | ICD-10-CM | POA: Insufficient documentation

## 2016-04-19 DIAGNOSIS — R55 Syncope and collapse: Secondary | ICD-10-CM | POA: Insufficient documentation

## 2016-04-19 LAB — CBC WITH DIFFERENTIAL/PLATELET
Basophils Absolute: 0 10*3/uL (ref 0.0–0.1)
Basophils Relative: 0 %
EOS ABS: 0.2 10*3/uL (ref 0.0–0.7)
Eosinophils Relative: 2 %
HCT: 36.8 % (ref 36.0–46.0)
Hemoglobin: 12 g/dL (ref 12.0–15.0)
LYMPHS ABS: 3.9 10*3/uL (ref 0.7–4.0)
LYMPHS PCT: 38 %
MCH: 28.5 pg (ref 26.0–34.0)
MCHC: 32.6 g/dL (ref 30.0–36.0)
MCV: 87.4 fL (ref 78.0–100.0)
MONO ABS: 0.6 10*3/uL (ref 0.1–1.0)
Monocytes Relative: 6 %
Neutro Abs: 5.6 10*3/uL (ref 1.7–7.7)
Neutrophils Relative %: 54 %
PLATELETS: 216 10*3/uL (ref 150–400)
RBC: 4.21 MIL/uL (ref 3.87–5.11)
RDW: 12.4 % (ref 11.5–15.5)
WBC: 10.3 10*3/uL (ref 4.0–10.5)

## 2016-04-19 LAB — BASIC METABOLIC PANEL
Anion gap: 7 (ref 5–15)
BUN: 8 mg/dL (ref 6–20)
CALCIUM: 9.8 mg/dL (ref 8.9–10.3)
CO2: 26 mmol/L (ref 22–32)
CREATININE: 0.68 mg/dL (ref 0.44–1.00)
Chloride: 105 mmol/L (ref 101–111)
GFR calc Af Amer: >60 mL/min (ref >60)
GLUCOSE: 94 mg/dL (ref 65–99)
POTASSIUM: 4 mmol/L (ref 3.5–5.1)
SODIUM: 138 mmol/L (ref 135–145)

## 2016-04-19 LAB — URINALYSIS, ROUTINE W REFLEX MICROSCOPIC
BILIRUBIN URINE: NEGATIVE
GLUCOSE, UA: NEGATIVE mg/dL
Hgb urine dipstick: NEGATIVE
Ketones, ur: NEGATIVE mg/dL
Leukocytes, UA: NEGATIVE
Nitrite: NEGATIVE
Protein, ur: NEGATIVE mg/dL
SPECIFIC GRAVITY, URINE: 1.017 (ref 1.005–1.030)
pH: 8 (ref 5.0–8.0)

## 2016-04-19 LAB — POC URINE PREG, ED: PREG TEST UR: NEGATIVE

## 2016-04-19 MED ORDER — SODIUM CHLORIDE 0.9 % IV BOLUS (SEPSIS)
1000.0000 mL | Freq: Once | INTRAVENOUS | Status: AC
  Administered 2016-04-19: 1000 mL via INTRAVENOUS

## 2016-04-19 NOTE — ED Notes (Signed)
Patient transported to CT 

## 2016-04-19 NOTE — ED Provider Notes (Signed)
Assumed care from PA Leaphart at shift change.  See his note for full H&P.  Briefly, 26 y.o. F here with recurrent syncope and questionable seizure activity while riding in care today with boyfriend.  No hx of seizures in the past.  Patient does see cardiology at [facility].  Patient does have care plan with this hospital due to multiple ED visits for same in the past.  EKG today unchanged, PERC negative.  CT head obtained and is negative.  Has been asymptomatic here in the ED, ambulatory without issue.  Normal orthostatics.  Plan:  Follow-up on labs, likely discharge with neurology referral.  Results for orders placed or performed during the hospital encounter of 04/19/16  Urinalysis, Routine w reflex microscopic  Result Value Ref Range   Color, Urine YELLOW YELLOW   APPearance CLOUDY (A) CLEAR   Specific Gravity, Urine 1.017 1.005 - 1.030   pH 8.0 5.0 - 8.0   Glucose, UA NEGATIVE NEGATIVE mg/dL   Hgb urine dipstick NEGATIVE NEGATIVE   Bilirubin Urine NEGATIVE NEGATIVE   Ketones, ur NEGATIVE NEGATIVE mg/dL   Protein, ur NEGATIVE NEGATIVE mg/dL   Nitrite NEGATIVE NEGATIVE   Leukocytes, UA NEGATIVE NEGATIVE  CBC with Differential  Result Value Ref Range   WBC 10.3 4.0 - 10.5 K/uL   RBC 4.21 3.87 - 5.11 MIL/uL   Hemoglobin 12.0 12.0 - 15.0 g/dL   HCT 16.1 09.6 - 04.5 %   MCV 87.4 78.0 - 100.0 fL   MCH 28.5 26.0 - 34.0 pg   MCHC 32.6 30.0 - 36.0 g/dL   RDW 40.9 81.1 - 91.4 %   Platelets 216 150 - 400 K/uL   Neutrophils Relative % 54 %   Neutro Abs 5.6 1.7 - 7.7 K/uL   Lymphocytes Relative 38 %   Lymphs Abs 3.9 0.7 - 4.0 K/uL   Monocytes Relative 6 %   Monocytes Absolute 0.6 0.1 - 1.0 K/uL   Eosinophils Relative 2 %   Eosinophils Absolute 0.2 0.0 - 0.7 K/uL   Basophils Relative 0 %   Basophils Absolute 0.0 0.0 - 0.1 K/uL  Basic metabolic panel  Result Value Ref Range   Sodium 138 135 - 145 mmol/L   Potassium 4.0 3.5 - 5.1 mmol/L   Chloride 105 101 - 111 mmol/L   CO2 26 22 - 32  mmol/L   Glucose, Bld 94 65 - 99 mg/dL   BUN 8 6 - 20 mg/dL   Creatinine, Ser 7.82 0.44 - 1.00 mg/dL   Calcium 9.8 8.9 - 95.6 mg/dL   GFR calc non Af Amer >60 >60 mL/min   GFR calc Af Amer >60 >60 mL/min   Anion gap 7 5 - 15  POC urine preg, ED  Result Value Ref Range   Preg Test, Ur NEGATIVE NEGATIVE   Ct Head Wo Contrast  Result Date: 04/19/2016 CLINICAL DATA:  Possible seizure EXAM: CT HEAD WITHOUT CONTRAST TECHNIQUE: Contiguous axial images were obtained from the base of the skull through the vertex without intravenous contrast. COMPARISON:  MRI 02/13/2015 FINDINGS: Brain: No evidence of acute infarction, hemorrhage, hydrocephalus, extra-axial collection or mass lesion/mass effect. Vascular: No hyperdense vessel or unexpected calcification. Skull: Negative Sinuses/Orbits: Negative Other: None IMPRESSION: Normal CT head Electronically Signed   By: Marlan Palau M.D.   On: 04/19/2016 19:42   On repeat assessment patient remains awake, alert, appropriately oriented. She is talking on phone with friends without issue. I have discussed her lab and imaging findings which were all  reassuring. She has previously scheduled follow-up with her cardiologist on 04/28/2016. [facility] in the past, given question of seizure activity today it may be worthwhile to follow-up with them as well.  I have given her driving precautions until follow-up. I also recommended that she notify her primary care doctor of these issues and ED visit today.  Discussed plan with patient, she acknowledged understanding and agreed with plan of care.  Return precautions given for new or worsening symptoms.   Garlon HatchetLisa M Sanders, PA-C 04/19/16 2138    Lorre NickAnthony Allen, MD 04/20/16 [phone]

## 2016-04-19 NOTE — ED Triage Notes (Signed)
Patient comes in per [facility] with poss syncopal episode and poss seizure in passenger seat of car. EMS sternal rubbed patient to arouse. Patient now a/ox4. Patient states she has a hx of irregular HR but she doesn't take any meds for it. EMS ekg showed NSR. HR between 80-140 with hx of postural orthostatic tachycardia syndrome. BP 112/74. fsbs 91.

## 2016-04-19 NOTE — ED Provider Notes (Signed)
[facility] DEPT Provider Note   CSN: [id number] Arrival date & time: 04/19/16  1814     History   Chief Complaint Chief Complaint  Patient presents with  . Near Syncope    HPI Desiree Gutierrez is a 26 y.o. female.  HPI  26 year old African-American female past medical history significant for WPW s/p ablation, POTS, and palpitaions presents to the ED today with complaint of possible syncopal episode and/or possible seizure. Patient was in the passenger seat of her boyfriend's car when her boyfriend states that she started having this jerking motion. He states that her body was shaking all over for approximately 10 minutes. Per EMS theh had to sternal rub the patient to get her to awake on their arrival. Patient has history of same and has been seen multiple times in the ED for same. Patient states this felt different. Pt states, "I usually just pass out when this happens, but I didn't today." Patient states that her heart was racing but did not go back to normal usually does. EMS performed an EKG that showed normal sinus rhythm. Her heart rate ranged between 80-140 with EMS. Patient endorses chest pain and mild shortness of breath. Pt states her cp and sob has since resolved. Patient states that she just has not felt good up until this episode today. There was concern for seizure-like activity. Patient has no history of seizure. She takes no medications for seizure. Her boyfriend denies patient hitting her head. Patient is unaware of the events that took place. She's followed by a cardiologist, Dr. Cristi [facility]ty at [facility]. She also has been referred to follow-up with a [facility] but does not yet have an appointment.She denies any fevers cough colds symptoms, urinary symptoms or other recent illnesses. Patient denies any headache, vision changes, lightheadedness, dizziness, vertigo-like symptoms, vision, abdominal pain, nausea, emesis, urinary symptoms, change in bowel  habits, parathesias. Patient states that she has an appointment with her cardiologist on the 25th of this months. She feels like she needs to see them sooner. The patient has been seen in the ED multiple times for same. Patient has a care plan. She denies any OCPs, recent hospitalizations/surgeries, prolonged immobilizations, history of DVT.    Past Medical History:  Diagnosis Date  . Headache   . Palpitations    a. s/p MDT ILR [facility]  . POTS (postural orthostatic tachycardia syndrome)   . Vasovagal syncope    a. positive tilt 2011 b. failed medical therapy with Florinef, Midodrine, Inderal, Celexa  . WPW (Wolff-Parkinson-White syndrome)    a. s/p ablation (2011) [facility] (posterior [facility] pathway)    Patient Active Problem List   Diagnosis Date Noted  . Purpura (HCC) 10/15/2015  . Dyspnea and respiratory abnormality 09/08/2015  . Diastolic dysfunction 09/08/2015  . Mold exposure 08/10/2015  . Vocal cord dysfunction 06/02/2015  . Stress at home 05/22/2015  . SOB (shortness of breath) 04/30/2015  . Pain of molar 02/13/2015  . Observed seizure-like activity (HCC) 02/02/2015  . Fall   . Hypoglycemia   . POTS (postural orthostatic tachycardia syndrome) 06/01/2014  . Iron deficiency anemia 06/01/2014  . Tachycardia 05/29/2014  . Syncope 12/03/2010  . WOLFF-PARKINSON-WHITE (WPW) SYNDROME status post ablation 08/20/2009  . Constipation 08/19/2009  . Chest pain 08/19/2009    Past Surgical History:  Procedure Laterality Date  . CARDIAC ELECTROPHYSIOLOGY STUDY AND ABLATION  2011   [facility]- left posterior pathway ablation for  WPW  . LOOP RECORDER IMPLANT  01/22/2014   MDT LINQ implanted by Dr [facility]fon[facility] Ellis at [facility] for evaluation of palpitations/syncope  . TILT TABLE STUDY  2011   neurally mediated syncope    OB History    No data available       Home Medications    Prior to Admission medications   Medication Sig Start Date End  Date Taking? Authorizing Provider  Ascorbic Acid (VITAMIN C) 1000 MG tablet Take 1,000 mg by mouth daily.    Historical Provider, MD  Calcium-Magnesium-Vitamin D (CALCIUM MAGNESIUM PO) Take 2 tablets by mouth daily.    Historical Provider, MD  Cholecalciferol (D-5000) 5000 UNITS TABS Take 5,000 Units by mouth daily.    Historical Provider, MD  Coenzyme Q10 (COQ10 PO) Take 1 tablet by mouth daily.    Historical Provider, MD  Cyanocobalamin (RA VITAMIN B-12 TR) 1000 MCG TBCR Take 1,000 mcg by mouth daily.    Historical Provider, MD  D-Ribose (RIBOSE, D,) POWD Take 200 g by mouth daily.    Historical Provider, MD  ferrous gluconate (FERGON) 324 MG tablet Take 1 tablet (324 mg total) by mouth daily with breakfast. 02/08/16   [name], MD  ibuprofen (ADVIL,MOTRIN) 200 MG tablet Take 400 mg by mouth 2 (two) times daily as needed for headache, moderate pain or cramping.     Historical Provider, MD  lactulose (CHRONULAC) 10 GM/15ML solution Take 30 mLs (20 g total) by mouth 2 (two) times daily as needed for mild constipation. Patient not taking: Reported on 03/01/2016 11/17/15   [name], MD  Multiple Vitamin (MULTIVITAMIN WITH MINERALS) TABS tablet Take 1 tablet by mouth daily.    Historical Provider, MD  Omega-3 1000 MG CAPS Take 1 capsule by mouth daily.    Historical Provider, MD  Probiotic Product (PROBIOTIC PO) Take 1 tablet by mouth daily.    Historical Provider, MD  sodium chloride 1 G tablet Take 1 g by mouth 2 (two) times daily.    Historical Provider, MD    Family History Family History  Problem Relation Age of Onset  . Diabetes Mother   . Hypertension Mother   . Stroke Mother   . Hypertension Brother   . Hyperlipidemia Maternal Grandmother   . Seizures Neg Hx     Social History Social History  Substance Use Topics  . Smoking status: Never Smoker  . Smokeless tobacco: Never Used  . Alcohol use No     Allergies   Amitiza [lubiprostone]; Other; Ketorolac;  Metoclopramide; Midodrine; and Fludrocortisone   Review of Systems Review of Systems  Constitutional: Negative for chills and fever.  HENT: Negative for congestion.   Eyes: Negative for visual disturbance.  Respiratory: Positive for shortness of breath. Negative for cough.   Cardiovascular: Positive for chest pain. Negative for palpitations.  Gastrointestinal: Negative for abdominal pain, diarrhea, nausea and vomiting.  Genitourinary: Negative for dysuria, flank pain, hematuria and urgency.  Musculoskeletal: Negative for back pain.  Neurological: Positive for seizures (possible) and syncope. Negative for dizziness, weakness, light-headedness and headaches.  All other systems reviewed and are negative.    Physical Exam Updated Vital Signs BP 116/73   Pulse 91   Temp 98.4 F (36.9 C) (Oral)   Resp 11   Ht 5\' 3"  (1.6 m)   Wt 64.4 kg   SpO2 99%   BMI 25.15 kg/m   Physical Exam  Constitutional: She is oriented to person, place, and time. She appears well-developed and well-nourished. No distress.  Patient is alert and oriented 4 now. She is able to talk in complete sentences.  HENT:  Head: Normocephalic and atraumatic.  Right Ear: Tympanic membrane, external ear and ear canal normal.  Left Ear: Tympanic membrane, external ear and ear canal normal.  Nose: Nose normal.  Mouth/Throat: Uvula is midline and oropharynx is clear and moist.  Eyes: Conjunctivae and EOM are normal. Pupils are equal, round, and reactive to light. Right eye exhibits no discharge. Left eye exhibits no discharge. No scleral icterus.  Neck: Normal range of motion. Neck supple. No thyromegaly present.  Cardiovascular: Normal rate, regular rhythm, normal heart sounds and intact distal pulses.  Exam reveals no gallop and no friction rub.   No murmur heard. Regular rate and rhythm  Pulmonary/Chest: Effort normal and breath sounds normal. No respiratory distress.  Abdominal: Soft. Bowel sounds are normal. She  exhibits no distension. There is no tenderness. There is no rebound and no guarding.  Musculoskeletal: Normal range of motion.  Moving all 4 extremities without any difficulties.  Lymphadenopathy:    She has no cervical adenopathy.  Neurological: She is alert and oriented to person, place, and time.  The patient is alert, attentive, and oriented x 3. Speech is clear. Cranial nerve II-VII grossly intact. Negative pronator drift. Sensation intact. Strength 5/5 in all extremities. Reflexes 2+ and symmetric at biceps, triceps, knees, and ankles. Rapid alternating movement and fine finger movements intact. Did not perform Romberg or assess gait due to patient feeling weak.  Patient ambulated to the bathroom without any difficulties. Normal gait.   Skin: Skin is warm and dry. Capillary refill takes less than 2 seconds.  Nursing note and vitals reviewed.    ED Treatments / Results  Labs (all labs ordered are listed, but only abnormal results are displayed) Labs Reviewed  URINALYSIS, ROUTINE W REFLEX MICROSCOPIC  CBC WITH DIFFERENTIAL/PLATELET  BASIC METABOLIC PANEL  POC URINE PREG, ED    EKG  EKG Interpretation  Date/Time:  Tuesday April 19 2016 18:18:00 EST Ventricular Rate:  97 PR Interval:    QRS Duration: 84 QT Interval:  331 QTC Calculation: 421 R Axis:   84 Text Interpretation:  Sinus rhythm Borderline short PR interval Borderline T wave abnormalities Confirmed by Freida Busman  MD, ANTHONY ([id number]) on 04/19/2016 6:58:18 PM       Radiology Ct Head Wo Contrast  Result Date: 04/19/2016 CLINICAL DATA:  Possible seizure EXAM: CT HEAD WITHOUT CONTRAST TECHNIQUE: Contiguous axial images were obtained from the base of the skull through the vertex without intravenous contrast. COMPARISON:  MRI 02/13/2015 FINDINGS: Brain: No evidence of acute infarction, hemorrhage, hydrocephalus, extra-axial collection or mass lesion/mass effect. Vascular: No hyperdense vessel or unexpected calcification.  Skull: Negative Sinuses/Orbits: Negative Other: None IMPRESSION: Normal CT head Electronically Signed   By: Marlan Palau M.D.   On: 04/19/2016 19:42    Procedures Procedures (including critical care time)  Medications Ordered in ED Medications  sodium chloride 0.9 % bolus 1,000 mL (1,000 mLs Intravenous New Bag/Given 04/19/16 1949)     Initial Impression / Assessment and Plan / ED Course  I have reviewed the triage vital signs and the nursing notes.  Pertinent labs & imaging results that were available during my care of the patient were reviewed by me and considered in my medical decision making (see chart for details).  Clinical Course   Patient presented to ED for possible syncopal versus seizure like activity. Patient's ED care plan was reviewed. Patient has had multiple visits  for this similar episodes. Her EKG in the ED is unchanged from prior and is concerning at this time as reviewed by myself and Dr. Freida Busman. Patient is perc negative and Wells 0. Low suspicion for PE. Patient has had several episodes of same. EKG is unchanged low suspicion for ACS. Troponin not performed at this time. Heart score 0. Symptoms are not consistent with ischemic concern. Patient has been symptom-free in the ED. Given patient's possible seizure-like activity and CAT scan of head was performed. No acute abnormalities were observed. Basic lab work was ordered to include UA, urine pregnancy, CBC, BMP. If lab work is unremarkable patient will likely be discharged home. Orthostatic vital signs were normal. She has been given a liter of fluid in the ED. Patient is not tachycardic. Patient needs follow-up with neurologist and will give ambulatory referral. I'll instruct the patient not to drive until she follows up with a neurologist. Patient has follow-up with her cardiologist on the 25th of this month. I have encouraged her to follow up sooner if her symptoms persist. Care hand off to PA Sanders. I discussed patient  with Dr. Freida Busman who agrees the above plan. Patient will likely be discharged home with follow-up with cardiologist neurology. Currently she is stable in no acute distress. She completed to the bathroom with normal gait.  Final Clinical Impressions(s) / ED Diagnoses   Final diagnoses:  None    New Prescriptions New Prescriptions   No medications on file     Rise Mu, PA-C 04/19/16 2017

## 2016-04-19 NOTE — Discharge Instructions (Addendum)
Follow-up with your cardiologist next week as scheduled. It may be worthwhile to you to see neurology again as well. Make sure to keep your primary care doctor in the loop as well. Return here for new/worsening symptoms.

## 2016-04-20 ENCOUNTER — Encounter (HOSPITAL_COMMUNITY): Payer: Self-pay

## 2016-04-22 ENCOUNTER — Encounter (HOSPITAL_COMMUNITY): Payer: Self-pay

## 2016-04-25 ENCOUNTER — Encounter (HOSPITAL_COMMUNITY): Payer: Self-pay

## 2016-04-27 ENCOUNTER — Encounter (HOSPITAL_COMMUNITY): Payer: Self-pay

## 2016-04-29 ENCOUNTER — Encounter (HOSPITAL_COMMUNITY): Payer: Self-pay

## 2016-05-02 ENCOUNTER — Encounter (HOSPITAL_COMMUNITY): Payer: Self-pay

## 2016-05-04 ENCOUNTER — Encounter (HOSPITAL_COMMUNITY): Payer: Self-pay

## 2016-05-06 ENCOUNTER — Encounter (HOSPITAL_COMMUNITY): Payer: Self-pay

## 2016-05-06 ENCOUNTER — Encounter: Payer: Self-pay | Admitting: Family Medicine

## 2016-05-06 ENCOUNTER — Ambulatory Visit: Payer: Medicaid Other | Attending: Family Medicine | Admitting: Family Medicine

## 2016-05-06 VITALS — BP 107/72 | HR 83 | Temp 98.7°F | Ht 63.0 in | Wt 155.6 lb

## 2016-05-06 DIAGNOSIS — I951 Orthostatic hypotension: Secondary | ICD-10-CM

## 2016-05-06 DIAGNOSIS — R000 Tachycardia, unspecified: Secondary | ICD-10-CM | POA: Diagnosis not present

## 2016-05-06 DIAGNOSIS — Z9889 Other specified postprocedural states: Secondary | ICD-10-CM | POA: Diagnosis not present

## 2016-05-06 DIAGNOSIS — R079 Chest pain, unspecified: Secondary | ICD-10-CM | POA: Diagnosis not present

## 2016-05-06 DIAGNOSIS — R55 Syncope and collapse: Secondary | ICD-10-CM | POA: Diagnosis not present

## 2016-05-06 DIAGNOSIS — I456 Pre-excitation syndrome: Secondary | ICD-10-CM | POA: Diagnosis not present

## 2016-05-06 DIAGNOSIS — R1030 Lower abdominal pain, unspecified: Secondary | ICD-10-CM | POA: Diagnosis not present

## 2016-05-06 DIAGNOSIS — R0602 Shortness of breath: Secondary | ICD-10-CM | POA: Insufficient documentation

## 2016-05-06 DIAGNOSIS — G90A Postural orthostatic tachycardia syndrome (POTS): Secondary | ICD-10-CM

## 2016-05-06 MED ORDER — IBUPROFEN 600 MG PO TABS: 600.0000 mg | ORAL_TABLET | Freq: Three times a day (TID) | ORAL | 0 refills | Status: DC | PRN

## 2016-05-06 MED FILL — IBUPROFEN 600 MG TABLET: 600 | 10 days supply | Qty: 30 | Fill #0

## 2016-05-06 NOTE — Assessment & Plan Note (Signed)
Lower abdominal pain and nausea No red flags: fever, weight loss, blood in stool Hx of constipation on iron therapy, suspect persistent constipation  Plan: abdominal  x-ray to assess stool burden For nausea  (OTC medication) Vitamin B6/pyridoxine  pyridoxine is 10 to 25 mg orally every six to eight hours  If vitamin B6 alone dose not control symptoms add unisom/doxyalamine 10-12.5 mg every 8 hrs

## 2016-05-06 NOTE — Telephone Encounter (Signed)
Agree with RN recommendations, patient will f/u with me in office

## 2016-05-06 NOTE — Progress Notes (Signed)
Subjective:  Patient ID: Desiree Gutierrez, female    DOB: 17-May-1990  Age: 26 y.o. MRN: [id number]  CC: Follow-up and Abdominal Pain   HPI Desiree Gutierrez has hx of  POTs syndrome, hx of WPW s/p ablation (2011)  recurrent syncope  presents for   1. Recurrent syncope in setting of suspected POTS: last episode of syncope was 3 months ago. She is still having palpitations, shortness of breath and chest pain. These symptoms are getting worse. She is followed by Cardiology, Dr. Gelene Mink at [facility]. Her last visit with Dr. Gelene Mink was on 01/14/2016. She was ordered to have another ECHO, which was normal except for trace/mild tricupsid, mitral and pulmonic valve regurgitation and Holter monitoring. She recently met with Dr. Skeet Latch local cardiologist on 01/28/16.  At this point she has been referred to [facility] to see an electrophysiology/syncope specialist. This referral was made following her July 09, 2015 office visit with Dr. Gelene Mink.   We are working on completing prior authorization. She had a syncope episode with tachycardia on 04/19/2016. She was seen in ED. She has been seen in f/u by Dr. Gelene Mink on 04/28/2016.    2. Abdominal pain: lower abdomen. Associated with nausea. No emesis. Nausea is associated with anorexia. Eating does not make the pain worse. Pain is sharp 7/10. Severe episodes of sharp pain cause her to double over and last 1-2 minutes. These pains occur daily. Has intermittent dysuria. Reports periods are also spacing out. She is skipping months and her periods are becoming heavier. She skipped periods and November and January. She is not sexually active. She admits to straining with bowel movements. No blood in stool or diarrhea. Dark colored stool.   Social History  Substance Use Topics  . Smoking status: Never Smoker  . Smokeless tobacco: Never Used  . Alcohol use No   Outpatient Medications Prior to Visit  Medication Sig Dispense Refill  . Ascorbic Acid (VITAMIN  C) 1000 MG tablet Take 1,000 mg by mouth daily.    . Calcium-Magnesium-Vitamin D (CALCIUM MAGNESIUM PO) Take 2 tablets by mouth daily.    . Cholecalciferol (D-5000) 5000 UNITS TABS Take 5,000 Units by mouth daily.    . Coenzyme Q10 (COQ10 PO) Take 1 tablet by mouth daily.    . Cyanocobalamin (RA VITAMIN B-12 TR) 1000 MCG TBCR Take 1,000 mcg by mouth daily.    Marland Kitchen D-Ribose (RIBOSE, D,) POWD Take 200 g by mouth daily.    . ferrous gluconate (FERGON) 324 MG tablet Take 1 tablet (324 mg total) by mouth daily with breakfast. 90 tablet 3  . ibuprofen (ADVIL,MOTRIN) 200 MG tablet Take 400 mg by mouth 2 (two) times daily as needed for headache, moderate pain or cramping.     . Multiple Vitamin (MULTIVITAMIN WITH MINERALS) TABS tablet Take 1 tablet by mouth daily.    . Omega-3 1000 MG CAPS Take 1 capsule by mouth daily.    . Probiotic Product (PROBIOTIC PO) Take 1 tablet by mouth daily.    . sodium chloride 1 G tablet Take 1 g by mouth 2 (two) times daily.    Marland Kitchen lactulose (CHRONULAC) 10 GM/15ML solution Take 30 mLs (20 g total) by mouth 2 (two) times daily as needed for mild constipation. (Patient not taking: Reported on 03/01/2016) 240 mL 0   No facility-administered medications prior to visit.     ROS Review of Systems  Constitutional: Positive for appetite change (always feels hungry and never satisfied ) and fatigue. Negative  for chills and fever.  HENT: Negative for nosebleeds.   Eyes: Negative for visual disturbance.  Respiratory: Positive for shortness of breath.   Cardiovascular: Positive for chest pain and palpitations.  Gastrointestinal: Negative for abdominal pain, blood in stool, nausea and vomiting.  Musculoskeletal: Negative for arthralgias and back pain.  Skin: Positive for color change. Negative for rash.  Allergic/Immunologic: Negative for immunocompromised state.  Neurological: Negative for syncope.  Hematological: Negative for adenopathy. Does not bruise/bleed easily.    Psychiatric/Behavioral: Negative for dysphoric mood and suicidal ideas.    Objective:  BP 107/72 (BP Location: Left Arm, Patient Position: Sitting, Cuff Size: Small)   Pulse 83   Temp 98.7 F (37.1 C) (Oral)   Ht _0  (1.6 m)   Wt 155 lb 9.6 oz (70.6 kg)   LMP 05/05/2016   SpO2 100%   BMI 27.56 kg/m   BP/Weight 05/06/2016 04/19/2016 25/63/8937  Systolic BP 342 876 811  Diastolic BP 72 73 77  Wt. (Lbs) 155.6 142 142  BMI 27.56 25.15 25.15   Physical Exam  Constitutional: She is oriented to person, place, and time. She appears well-developed and well-nourished. No distress.  HENT:  Head: Normocephalic and atraumatic.  Cardiovascular: Normal rate, regular rhythm, normal heart sounds and intact distal pulses.   Pulmonary/Chest: Effort normal and breath sounds normal.  Abdominal: Soft. Bowel sounds are normal. She exhibits no distension. There is tenderness (mild b/l lower quadrant tenderness without mass ). There is no rebound, no guarding and no CVA tenderness.  Musculoskeletal: She exhibits no edema.  Neurological: She is alert and oriented to person, place, and time.  Skin: Skin is warm and dry. No rash noted.  Psychiatric: She has a normal mood and affect.    Assessment & Plan:  Desiree Gutierrez was seen today for follow-up and abdominal pain.  Diagnoses and all orders for this visit:  Lower abdominal pain -     DG Abd 1 View; Future -     ibuprofen (ADVIL,MOTRIN) 600 MG tablet; Take 1 tablet (600 mg total) by mouth every 8 (eight) hours as needed. On day before and first few days of period   There are no diagnoses linked to this encounter. Desiree Gutierrez was seen today for follow-up and abdominal pain.  Diagnoses and all orders for this visit:  Lower abdominal pain -     DG Abd 1 View; Future -     ibuprofen (ADVIL,MOTRIN) 600 MG tablet; Take 1 tablet (600 mg total) by mouth every 8 (eight) hours as needed. On day before and first few days of period   Meds ordered this  encounter  Medications  . ibuprofen (ADVIL,MOTRIN) 600 MG tablet    Sig: Take 1 tablet (600 mg total) by mouth every 8 (eight) hours as needed. On day before and first few days of period    Dispense:  30 tablet    Refill:  0    Follow-up: No Follow-up on file.   Boykin Nearing MD

## 2016-05-06 NOTE — Telephone Encounter (Signed)
Good Morning  Dr  Armen PickupFunches  [facility] is any way that your assistant can do that they requesting letters and reasons why she need to go there that the pcp need to provide . I will be out next week and I'll be back on the 12 . Thank You . Going forwarder the doctor assistant  Or whoever clinical is in admin day will need to do the Prior Authorizations because they asking a lot of clinical information that I don't know and they are denied the procedures .

## 2016-05-06 NOTE — Assessment & Plan Note (Signed)
Recurrent syncope that occurs with and without positional changes Tachycardia  Patient with POTs or POTs-like condition Working on completing prior authorization process Currently gathering clinical information

## 2016-05-06 NOTE — Telephone Encounter (Signed)
Jerelene ReddenHey Nora,  I have received the e-mail as well. We will work on gathering the clinical information and let you know if there are any non-clinical but more procedural things like faxing, information entry, making f/u calls, etc. That we may need your help with.   Thank you,  Dr. Armen PickupFunches

## 2016-05-06 NOTE — Telephone Encounter (Signed)
It looks like the prior authorization has to be processed thru [facility] for Out of State Medical/Surgical  Phone: [phone]

## 2016-05-06 NOTE — Progress Notes (Signed)
Pt is here today for a hospital follow up. Pt states that she is having abdominal pain.

## 2016-05-06 NOTE — Telephone Encounter (Signed)
Sure  Dr Armen PickupFunches  No problem   Thank You   Have a Nice rest of the day and weekend

## 2016-05-06 NOTE — Telephone Encounter (Signed)
I called [place] [employer] Was transferred to [employer] Department Spoke to Eliezer Bottomngie  Angie will e-mail the forms necessary to start the prior authorization process for out of state medical services.  I asked that she e-mail me as well as Cheryll DessertNora Soler, referral coordinator.

## 2016-05-06 NOTE — Patient Instructions (Addendum)
Kinda was seen today for hospitalization follow-up.  Diagnoses and all orders for this visit:  Lower abdominal pain -     DG Abd 1 View; Future -     ibuprofen (ADVIL,MOTRIN) 600 MG tablet; Take 1 tablet (600 mg total) by mouth every 8 (eight) hours as needed. On day before and first few days of period    For nausea  (OTC medication) Vitamin B6/pyridoxine  pyridoxine is 10 to 25 mg orally every six to eight hours  If vitamin B6 alone dose not control symptoms add unisom/doxyalamine 10-12.5 mg every 8 hrs   You will be called with x-ray results and whether or not you should start lactulose  F/u in 6 weeks for addominal pains   Dr. Armen PickupFunches

## 2016-05-06 NOTE — Telephone Encounter (Signed)
Called to [facility], Desiree Gutierrez was out of the office for a few days. Spoke to [name]. He confirmed/advised that the prior authorization will need to be completed from my office as patient's PCP through [facility].  He provided [facility] Tax ID # to complete the form,  [facility] tax ID # [id number]\  Arna Mediciora, will contact [name] and inquire about what form may be needed for authorize a patient to receive services out of state?  I will also research it.   Thank you for your continued work on this matter.  Dr. Armen PickupFunches

## 2016-05-09 ENCOUNTER — Encounter (HOSPITAL_COMMUNITY): Payer: Self-pay

## 2016-05-11 ENCOUNTER — Encounter (HOSPITAL_COMMUNITY): Payer: Self-pay

## 2016-05-12 ENCOUNTER — Ambulatory Visit (HOSPITAL_COMMUNITY)
Admission: RE | Admit: 2016-05-12 | Discharge: 2016-05-12 | Disposition: A | Discharge status: Home / Self Care | Payer: Medicaid Other | Source: Ambulatory Visit | Attending: Family Medicine | Admitting: Family Medicine

## 2016-05-12 DIAGNOSIS — R1030 Lower abdominal pain, unspecified: Secondary | ICD-10-CM | POA: Diagnosis not present

## 2016-05-12 DIAGNOSIS — K5900 Constipation, unspecified: Secondary | ICD-10-CM | POA: Insufficient documentation

## 2016-05-13 ENCOUNTER — Encounter (HOSPITAL_COMMUNITY): Payer: Self-pay

## 2016-05-16 ENCOUNTER — Encounter (HOSPITAL_COMMUNITY): Payer: Self-pay

## 2016-05-18 ENCOUNTER — Encounter (HOSPITAL_COMMUNITY): Payer: Self-pay

## 2016-05-20 ENCOUNTER — Encounter (HOSPITAL_COMMUNITY): Payer: Self-pay

## 2016-05-23 ENCOUNTER — Encounter (HOSPITAL_COMMUNITY): Payer: Self-pay

## 2016-05-25 ENCOUNTER — Encounter (HOSPITAL_COMMUNITY): Payer: Self-pay

## 2016-05-27 ENCOUNTER — Encounter (HOSPITAL_COMMUNITY): Payer: Self-pay

## 2016-05-30 ENCOUNTER — Encounter (HOSPITAL_COMMUNITY): Payer: Self-pay

## 2016-06-01 ENCOUNTER — Encounter (HOSPITAL_COMMUNITY): Payer: Self-pay

## 2016-06-03 ENCOUNTER — Encounter (HOSPITAL_COMMUNITY): Payer: Self-pay

## 2016-06-06 ENCOUNTER — Encounter (HOSPITAL_COMMUNITY): Payer: Self-pay

## 2016-06-08 ENCOUNTER — Encounter (HOSPITAL_COMMUNITY): Payer: Self-pay

## 2016-06-10 ENCOUNTER — Encounter (HOSPITAL_COMMUNITY): Payer: Self-pay

## 2016-06-13 ENCOUNTER — Ambulatory Visit: Payer: Medicaid Other | Admitting: Family Medicine

## 2016-06-13 ENCOUNTER — Encounter (HOSPITAL_COMMUNITY): Payer: Self-pay

## 2016-06-15 ENCOUNTER — Encounter (HOSPITAL_COMMUNITY): Payer: Self-pay

## 2016-06-16 ENCOUNTER — Encounter: Payer: Self-pay | Admitting: Family Medicine

## 2016-06-16 ENCOUNTER — Other Ambulatory Visit: Payer: Self-pay

## 2016-06-16 ENCOUNTER — Ambulatory Visit: Payer: Medicaid Other | Attending: Family Medicine | Admitting: Family Medicine

## 2016-06-16 VITALS — BP 136/76 | HR 85 | Temp 98.9°F | Ht 63.0 in | Wt 156.8 lb

## 2016-06-16 DIAGNOSIS — K5901 Slow transit constipation: Secondary | ICD-10-CM | POA: Insufficient documentation

## 2016-06-16 DIAGNOSIS — R55 Syncope and collapse: Secondary | ICD-10-CM | POA: Diagnosis not present

## 2016-06-16 DIAGNOSIS — R109 Unspecified abdominal pain: Secondary | ICD-10-CM | POA: Diagnosis not present

## 2016-06-16 DIAGNOSIS — I456 Pre-excitation syndrome: Secondary | ICD-10-CM | POA: Diagnosis not present

## 2016-06-16 DIAGNOSIS — R079 Chest pain, unspecified: Secondary | ICD-10-CM | POA: Insufficient documentation

## 2016-06-16 MED ORDER — SENNOSIDES-DOCUSATE SODIUM 8.6-50 MG PO TABS: 1.0000 | ORAL_TABLET | Freq: Two times a day (BID) | ORAL | 2 refills | Status: DC

## 2016-06-16 MED ORDER — LACTULOSE 10 GM/15ML PO SOLN: 10.0000 g | Freq: Every day | ORAL | 2 refills | Status: DC

## 2016-06-16 MED FILL — LACTULOSE 10 GM/15 ML SOLN: 10 | 16 days supply | Qty: 240 | Fill #0

## 2016-06-16 NOTE — Patient Instructions (Addendum)
Desiree Gutierrez was seen today for abdominal pain.  Diagnoses and all orders for this visit:  Left sided chest pain -     EKG 12-Lead  Slow transit constipation -     lactulose (CHRONULAC) 10 GM/15ML solution; Take 15 mLs (10 g total) by mouth daily. -     senna-docusate (SENOKOT-S) 8.6-50 MG tablet; Take 1 tablet by mouth 2 (two) times daily.   Take 15 ml (10 grams) of lactulose which is 1 tablespoon, if this helps but you do not have bowel movement at least every other day increase to 30 mg (20 grams), 2 tablespoons.   f/u in 4 weeks for chronic constipation  Dr. Armen PickupFunches

## 2016-06-16 NOTE — Assessment & Plan Note (Signed)
Chronic constipation, no response to miralax in the past  Plan: Lactulose  Colace

## 2016-06-16 NOTE — Progress Notes (Signed)
Subjective:  Patient ID: Desiree Gutierrez, female    DOB: 1990-09-24  Age: 26 y.o. MRN: [id number]  CC: Abdominal Pain   HPI Desiree Gutierrez has hx of  POTs syndrome, hx of WPW s/p ablation (2011)  recurrent syncope  presents for   1. Recurrent syncope in setting of suspected POTS: last episode of syncope was 3 months ago. She is still having palpitations, shortness of breath and chest pain. These symptoms are getting worse. She is followed by Cardiology, Dr. Gelene Mink at [facility]. Her last visit with Dr. Gelene Mink was on 01/14/2016. She was ordered to have another ECHO, which was normal except for trace/mild tricupsid, mitral and pulmonic valve regurgitation and Holter monitoring. She recently met with Dr. Skeet Latch local cardiologist on 01/28/16.  At this point she has been referred to [facility] to see an electrophysiology/syncope specialist. This referral was made following her July 09, 2015 office visit with Dr. Gelene Mink.   We are working on completing prior authorization. She had a syncope episode with tachycardia on 04/19/2016. She was seen in ED. She has been seen in f/u by Dr. Gelene Mink on 04/28/2016. She reports no recurrent syncope. She does report numbness and pain in the L side of her chest. Pains comes and goes. Pain last for about 5 minutes. She is currently experiencing chest pain.    2. Chronic constipation:  lower abdomen has resolved. She completed abdominal x-ray that confirmed stool burden. She is not taking lactulose due to reporting not being able to measure it. She has hard bowel movement about every 2 weeks. She has scant blood in stool. She has nausea. No emesis. She is not taking oral iron. She reports that she struggles to get in 64 oz of water per day.   Social History  Substance Use Topics  . Smoking status: Never Smoker  . Smokeless tobacco: Never Used  . Alcohol use No   Outpatient Medications Prior to Visit  Medication Sig Dispense Refill  . Ascorbic Acid  (VITAMIN C) 1000 MG tablet Take 1,000 mg by mouth daily.    . Calcium-Magnesium-Vitamin D (CALCIUM MAGNESIUM PO) Take 2 tablets by mouth daily.    . Cholecalciferol (D-5000) 5000 UNITS TABS Take 5,000 Units by mouth daily.    . Coenzyme Q10 (COQ10 PO) Take 1 tablet by mouth daily.    . Cyanocobalamin (RA VITAMIN B-12 TR) 1000 MCG TBCR Take 1,000 mcg by mouth daily.    Marland Kitchen D-Ribose (RIBOSE, D,) POWD Take 200 g by mouth daily.    . ferrous gluconate (FERGON) 324 MG tablet Take 1 tablet (324 mg total) by mouth daily with breakfast. 90 tablet 3  . ibuprofen (ADVIL,MOTRIN) 600 MG tablet Take 1 tablet (600 mg total) by mouth every 8 (eight) hours as needed. On day before and first few days of period 30 tablet 0  . Multiple Vitamin (MULTIVITAMIN WITH MINERALS) TABS tablet Take 1 tablet by mouth daily.    . Omega-3 1000 MG CAPS Take 1 capsule by mouth daily.    . Probiotic Product (PROBIOTIC PO) Take 1 tablet by mouth daily.    . sodium chloride 1 G tablet Take 1 g by mouth 2 (two) times daily.    Marland Kitchen lactulose (CHRONULAC) 10 GM/15ML solution Take 30 mLs (20 g total) by mouth 2 (two) times daily as needed for mild constipation. (Patient not taking: Reported on 03/01/2016) 240 mL 0   No facility-administered medications prior to visit.     ROS Review of  Systems  Constitutional: Positive for appetite change (always feels hungry and never satisfied ) and fatigue. Negative for chills and fever.  HENT: Negative for nosebleeds.   Eyes: Negative for visual disturbance.  Respiratory: Positive for shortness of breath.   Cardiovascular: Positive for chest pain and palpitations.  Gastrointestinal: Positive for constipation and nausea. Negative for abdominal pain, blood in stool and vomiting.  Musculoskeletal: Negative for arthralgias and back pain.  Skin: Positive for color change. Negative for rash.  Allergic/Immunologic: Negative for immunocompromised state.  Neurological: Negative for syncope.    Hematological: Negative for adenopathy. Does not bruise/bleed easily.  Psychiatric/Behavioral: Negative for dysphoric mood and suicidal ideas.    Objective:  BP 136/76   Pulse 85   Temp 98.9 F (37.2 C) (Oral)   Ht _0  (1.6 m)   Wt 156 lb 12.8 oz (71.1 kg)   LMP 06/08/2016   SpO2 100%   BMI 27.78 kg/m   BP/Weight 06/16/2016 05/06/2016 2/80/0349  Systolic BP 179 150 569  Diastolic BP 76 72 73  Wt. (Lbs) 156.8 155.6 142  BMI 27.78 27.56 25.15   Physical Exam  Constitutional: She is oriented to person, place, and time. She appears well-developed and well-nourished. No distress.  HENT:  Head: Normocephalic and atraumatic.  Cardiovascular: Normal rate, regular rhythm, normal heart sounds and intact distal pulses.   Pulmonary/Chest: Effort normal and breath sounds normal.  Abdominal: Soft. Bowel sounds are normal. She exhibits no distension. There is tenderness (mild b/l lower quadrant tenderness without mass ). There is no rebound, no guarding and no CVA tenderness.  Musculoskeletal: She exhibits no edema.  Neurological: She is alert and oriented to person, place, and time.  Skin: Skin is warm and dry. No rash noted.  Psychiatric: She has a normal mood and affect.   EKG: normal EKG, normal sinus rhythm, unchanged from previous tracings.  Assessment & Plan:  Desiree Gutierrez was seen today for abdominal pain.  Diagnoses and all orders for this visit:  Left sided chest pain -     EKG 12-Lead  Slow transit constipation -     lactulose (CHRONULAC) 10 GM/15ML solution; Take 15 mLs (10 g total) by mouth daily. -     senna-docusate (SENOKOT-S) 8.6-50 MG tablet; Take 1 tablet by mouth 2 (two) times daily.   There are no diagnoses linked to this encounter. There are no diagnoses linked to this encounter. No orders of the defined types were placed in this encounter.   Follow-up: Return in about 4 weeks (around 07/14/2016) for constipation .   Boykin Nearing MD

## 2016-06-17 ENCOUNTER — Encounter (HOSPITAL_COMMUNITY): Payer: Self-pay

## 2016-06-20 ENCOUNTER — Encounter (HOSPITAL_COMMUNITY): Payer: Self-pay

## 2016-06-22 ENCOUNTER — Encounter (HOSPITAL_COMMUNITY): Payer: Self-pay

## 2016-06-24 ENCOUNTER — Encounter (HOSPITAL_COMMUNITY): Payer: Self-pay

## 2016-06-27 ENCOUNTER — Encounter (HOSPITAL_COMMUNITY): Payer: Self-pay

## 2016-06-29 ENCOUNTER — Encounter (HOSPITAL_COMMUNITY): Payer: Self-pay

## 2016-07-01 ENCOUNTER — Encounter (HOSPITAL_COMMUNITY): Payer: Self-pay

## 2016-07-04 ENCOUNTER — Encounter (HOSPITAL_COMMUNITY): Payer: Self-pay

## 2016-07-06 ENCOUNTER — Encounter (HOSPITAL_COMMUNITY): Payer: Self-pay

## 2016-07-08 ENCOUNTER — Encounter (HOSPITAL_COMMUNITY): Payer: Self-pay

## 2016-07-11 ENCOUNTER — Encounter (HOSPITAL_COMMUNITY): Payer: Self-pay

## 2016-07-13 ENCOUNTER — Encounter (HOSPITAL_COMMUNITY): Payer: Self-pay

## 2016-07-15 ENCOUNTER — Encounter (HOSPITAL_COMMUNITY): Payer: Self-pay

## 2016-07-18 ENCOUNTER — Encounter (HOSPITAL_COMMUNITY): Payer: Self-pay

## 2016-07-18 ENCOUNTER — Ambulatory Visit: Payer: Medicaid Other | Attending: Family Medicine | Admitting: Family Medicine

## 2016-07-18 ENCOUNTER — Encounter: Payer: Self-pay | Admitting: Family Medicine

## 2016-07-18 VITALS — BP 100/67 | HR 88 | Temp 98.1°F | Ht 63.0 in | Wt 156.4 lb

## 2016-07-18 DIAGNOSIS — K5901 Slow transit constipation: Secondary | ICD-10-CM | POA: Diagnosis present

## 2016-07-18 DIAGNOSIS — D509 Iron deficiency anemia, unspecified: Secondary | ICD-10-CM | POA: Insufficient documentation

## 2016-07-18 DIAGNOSIS — I456 Pre-excitation syndrome: Secondary | ICD-10-CM | POA: Diagnosis not present

## 2016-07-18 DIAGNOSIS — N946 Dysmenorrhea, unspecified: Secondary | ICD-10-CM | POA: Diagnosis not present

## 2016-07-18 DIAGNOSIS — Z9889 Other specified postprocedural states: Secondary | ICD-10-CM | POA: Insufficient documentation

## 2016-07-18 DIAGNOSIS — R55 Syncope and collapse: Secondary | ICD-10-CM | POA: Insufficient documentation

## 2016-07-18 MED ORDER — FERROUS GLUCONATE 324 (38 FE) MG PO TABS: 324.0000 mg | ORAL_TABLET | Freq: Every day | ORAL | 3 refills | Status: DC

## 2016-07-18 MED ORDER — LACTULOSE 10 GM/15ML PO SOLN: 20.0000 g | Freq: Every day | ORAL | 2 refills | Status: DC

## 2016-07-18 MED ORDER — SENNOSIDES-DOCUSATE SODIUM 8.6-50 MG PO TABS: 1.0000 | ORAL_TABLET | Freq: Two times a day (BID) | ORAL | 2 refills | Status: DC

## 2016-07-18 NOTE — Assessment & Plan Note (Signed)
Improved with lactulose Patient advised to also take senokot-S

## 2016-07-18 NOTE — Progress Notes (Signed)
Subjective:  Patient ID: Desiree Gutierrez, female    DOB: 17-Sep-1990  Age: 26 y.o. MRN: [id number]  CC: Follow Up Constipation   HPI Desiree Gutierrez has hx of  POTs syndrome, hx of WPW s/p ablation (2011)  recurrent syncope  presents for   1. Recurrent syncope in setting of suspected POTS: last episode of syncope was 3 months ago. She is still having palpitations, shortness of breath and chest pain. These symptoms are getting worse. She is followed by Cardiology, Dr. Gelene Mink at [facility]. Her last visit with Dr. Gelene Mink was on 01/14/2016. She was ordered to have another ECHO, which was normal except for trace/mild tricupsid, mitral and pulmonic valve regurgitation and Holter monitoring. She recently met with Dr. Skeet Latch local cardiologist on 01/28/16.  At this point she has been referred to [facility] to see an electrophysiology/syncope specialist. This referral was made following her July 09, 2015 office visit with Dr. Gelene Mink.   We are working on completing prior authorization. She had a syncope episode with tachycardia on 04/19/2016. She was seen in ED. She has been seen in f/u by Dr. Gelene Mink on 04/28/2016. She reports no recurrent syncope. She does report numbness and pain in the L side of her chest. Pains comes and goes. Pain last for about 5 minutes. She is currently experiencing chest pain.    2. Chronic constipation:  lower abdomen has resolved. She completed abdominal x-ray that confirmed stool burden. She is taking lactulose most day.. She has hard bowel movement about every 2-3 days . She has scant blood in stool. She has nausea. No emesis. She is not taking oral iron. She reports that she struggles to get in 64 oz of water per day.   3. Change in menstrual cycles: she report longer menstrual cycles over the past several months. This is in the setting of 15 lb weight gain over the last year. She is not sexually active. She reports that ibuprofen has helped with dysmenorrhea.    Social History  Substance Use Topics  . Smoking status: Never Smoker  . Smokeless tobacco: Never Used  . Alcohol use No   Outpatient Medications Prior to Visit  Medication Sig Dispense Refill  . Ascorbic Acid (VITAMIN C) 1000 MG tablet Take 1,000 mg by mouth daily.    . Calcium-Magnesium-Vitamin D (CALCIUM MAGNESIUM PO) Take 2 tablets by mouth daily.    . Cholecalciferol (D-5000) 5000 UNITS TABS Take 5,000 Units by mouth daily.    . Coenzyme Q10 (COQ10 PO) Take 1 tablet by mouth daily.    . Cyanocobalamin (RA VITAMIN B-12 TR) 1000 MCG TBCR Take 1,000 mcg by mouth daily.    Marland Kitchen D-Ribose (RIBOSE, D,) POWD Take 200 g by mouth daily.    . ferrous gluconate (FERGON) 324 MG tablet Take 1 tablet (324 mg total) by mouth daily with breakfast. 90 tablet 3  . ibuprofen (ADVIL,MOTRIN) 600 MG tablet Take 1 tablet (600 mg total) by mouth every 8 (eight) hours as needed. On day before and first few days of period 30 tablet 0  . lactulose (CHRONULAC) 10 GM/15ML solution Take 15 mLs (10 g total) by mouth daily. 240 mL 2  . Multiple Vitamin (MULTIVITAMIN WITH MINERALS) TABS tablet Take 1 tablet by mouth daily.    . Omega-3 1000 MG CAPS Take 1 capsule by mouth daily.    . Probiotic Product (PROBIOTIC PO) Take 1 tablet by mouth daily.    Marland Kitchen senna-docusate (SENOKOT-S) 8.6-50 MG tablet Take 1  tablet by mouth 2 (two) times daily. 60 tablet 2  . sodium chloride 1 G tablet Take 1 g by mouth 2 (two) times daily.     No facility-administered medications prior to visit.     ROS Review of Systems  Constitutional: Positive for appetite change (always feels hungry and never satisfied ) and fatigue. Negative for chills and fever.  HENT: Negative for nosebleeds.   Eyes: Negative for visual disturbance.  Respiratory: Positive for shortness of breath.   Cardiovascular: Positive for chest pain and palpitations.  Gastrointestinal: Positive for constipation and nausea. Negative for abdominal pain, blood in stool and  vomiting.  Musculoskeletal: Negative for arthralgias and back pain.  Skin: Positive for color change. Negative for rash.  Allergic/Immunologic: Negative for immunocompromised state.  Neurological: Negative for syncope.  Hematological: Negative for adenopathy. Does not bruise/bleed easily.  Psychiatric/Behavioral: Negative for dysphoric mood and suicidal ideas.    Objective:  BP 100/67   Pulse 88   Temp 98.1 F (36.7 C) (Oral)   Ht '5\' 3"'$  (1.6 m)   Wt 156 lb 6.4 oz (70.9 kg)   LMP 07/15/2016 (Exact Date)   SpO2 100%   BMI 27.71 kg/m   BP/Weight 07/18/2016 8/93/8101 10/07/1023  Systolic BP 852 778 242  Diastolic BP 67 76 72  Wt. (Lbs) 156.4 156.8 155.6  BMI 27.71 27.78 27.56   Physical Exam  Constitutional: She is oriented to person, place, and time. She appears well-developed and well-nourished. No distress.  HENT:  Head: Normocephalic and atraumatic.  Cardiovascular: Normal rate, regular rhythm, normal heart sounds and intact distal pulses.   Pulmonary/Chest: Effort normal and breath sounds normal.  Abdominal: Soft. Bowel sounds are normal. She exhibits no distension. There is tenderness (mild b/l lower quadrant tenderness without mass ). There is no rebound, no guarding and no CVA tenderness.  Musculoskeletal: She exhibits no edema.  Neurological: She is alert and oriented to person, place, and time.  Skin: Skin is warm and dry. No rash noted.  Psychiatric: She has a normal mood and affect.    Assessment & Plan:  Desiree Gutierrez was seen today for follow up constipation.  Diagnoses and all orders for this visit:  Slow transit constipation -     lactulose (CHRONULAC) 10 GM/15ML solution; Take 30 mLs (20 g total) by mouth daily. -     senna-docusate (SENOKOT-S) 8.6-50 MG tablet; Take 1 tablet by mouth 2 (two) times daily.  Iron deficiency anemia, unspecified iron deficiency anemia type -     ferrous gluconate (FERGON) 324 MG tablet; Take 1 tablet (324 mg total) by mouth daily  with breakfast.   There are no diagnoses linked to this encounter. There are no diagnoses linked to this encounter. No orders of the defined types were placed in this encounter.   Follow-up: Return in about 3 months (around 10/17/2016) for constipation, presyncope .   Boykin Nearing MD

## 2016-07-18 NOTE — Patient Instructions (Addendum)
Desiree Gutierrez was seen today for follow up constipation.  Diagnoses and all orders for this visit:  Slow transit constipation -     lactulose (CHRONULAC) 10 GM/15ML solution; Take 30 mLs (20 g total) by mouth daily. -     senna-docusate (SENOKOT-S) 8.6-50 MG tablet; Take 1 tablet by mouth 2 (two) times daily.  Iron deficiency anemia, unspecified iron deficiency anemia type -     ferrous gluconate (FERGON) 324 MG tablet; Take 1 tablet (324 mg total) by mouth daily with breakfast.  increase lactulose by 10 grams every 2-3 weeks up to 40 grams daily with goal of 1 soft bowel movement daily  f/u in 2 month for constipation   Dr. Armen Pickup

## 2016-07-20 ENCOUNTER — Encounter (HOSPITAL_COMMUNITY): Payer: Self-pay

## 2016-07-22 ENCOUNTER — Encounter (HOSPITAL_COMMUNITY): Payer: Self-pay

## 2016-07-25 ENCOUNTER — Encounter (HOSPITAL_COMMUNITY): Payer: Self-pay

## 2016-07-27 ENCOUNTER — Encounter (HOSPITAL_COMMUNITY): Payer: Self-pay

## 2016-07-28 ENCOUNTER — Emergency Department (HOSPITAL_COMMUNITY)
Admission: EM | Admit: 2016-07-28 | Discharge: 2016-07-28 | Disposition: A | Discharge status: Home / Self Care | Payer: Medicaid Other | Attending: Emergency Medicine | Admitting: Emergency Medicine

## 2016-07-28 ENCOUNTER — Emergency Department (HOSPITAL_COMMUNITY): Payer: Medicaid Other

## 2016-07-28 ENCOUNTER — Encounter (HOSPITAL_COMMUNITY): Payer: Self-pay

## 2016-07-28 DIAGNOSIS — W07XXXA Fall from chair, initial encounter: Secondary | ICD-10-CM | POA: Insufficient documentation

## 2016-07-28 DIAGNOSIS — Y999 Unspecified external cause status: Secondary | ICD-10-CM | POA: Insufficient documentation

## 2016-07-28 DIAGNOSIS — Y939 Activity, unspecified: Secondary | ICD-10-CM | POA: Insufficient documentation

## 2016-07-28 DIAGNOSIS — S0990XA Unspecified injury of head, initial encounter: Secondary | ICD-10-CM | POA: Diagnosis not present

## 2016-07-28 DIAGNOSIS — Y929 Unspecified place or not applicable: Secondary | ICD-10-CM | POA: Insufficient documentation

## 2016-07-28 DIAGNOSIS — R55 Syncope and collapse: Secondary | ICD-10-CM | POA: Insufficient documentation

## 2016-07-28 MED ORDER — ACETAMINOPHEN 500 MG PO TABS
1000.0000 mg | ORAL_TABLET | Freq: Once | ORAL | Status: AC
  Administered 2016-07-28: 1000 mg via ORAL
  Filled 2016-07-28: qty 2

## 2016-07-28 NOTE — ED Provider Notes (Signed)
[facility]-EMERGENCY DEPT Provider Note   CSN: [id number] Arrival date & time: 07/28/16  1156     History   Chief Complaint Chief Complaint  Patient presents with  . Loss of Consciousness    PT was at the dentist office when she syncopized today pt has a history of doing this s/p an ablation     HPI Desiree Gutierrez is a 26 y.o. female.  HPI   Patient is a 26 year old female with history of WPW (s/p ablation in 2011), vasovagal syncope and POTS who presents to the ED via EMS status post syncopal episode with witnessed head injury. Patient states she was at her dentist office having a procedure done and notes after numbing medicine she began to feel her heart racing, her breathing increased and then she passed out; which is consistent with her typical syncopal episodes. Patient states her dentist witnessed her syncopal episode and reports her falling out of the chair and landing onto her head. Patient reports having associated pain to the left side of her head. Denies currently having any other complaints. Denies lightheadedness, dizziness, visual changes, neck pain/stiffness, chest pain, shortness of breath, palpitations, abdominal pain, nausea, vomiting, numbness, weakness.  Past Medical History:  Diagnosis Date  . Headache   . Palpitations    a. s/p MDT ILR implanted by Dr Alfonso Ellis [facility])  . POTS (postural orthostatic tachycardia syndrome)   . Vasovagal syncope    a. positive tilt 2011 b. failed medical therapy with Florinef, Midodrine, Inderal, Celexa  . WPW (Wolff-Parkinson-White syndrome)    a. s/p ablation (2011) Dr Gevena Cotton at [facility] (posterior lateral pathway)    Patient Active Problem List   Diagnosis Date Noted  . Purpura (HCC) 10/15/2015  . Dyspnea and respiratory abnormality 09/08/2015  . Diastolic dysfunction 09/08/2015  . Mold exposure 08/10/2015  . Vocal cord dysfunction 06/02/2015  . Stress at home 05/22/2015  . SOB (shortness of breath) 04/30/2015  . Pain of molar  02/13/2015  . Observed seizure-like activity (HCC) 02/02/2015  . Lower abdominal pain 11/06/2014  . Fall   . Hypoglycemia   . POTS (postural orthostatic tachycardia syndrome) 06/01/2014  . Iron deficiency anemia 06/01/2014  . Tachycardia 05/29/2014  . Syncope 12/03/2010  . WOLFF-PARKINSON-WHITE (WPW) SYNDROME status post ablation 08/20/2009  . Constipation 08/19/2009  . Chest pain 08/19/2009    Past Surgical History:  Procedure Laterality Date  . CARDIAC ELECTROPHYSIOLOGY STUDY AND ABLATION  2011   Dr. Gevena Cotton at [facility]- left posterior pathway ablation for WPW  . LOOP RECORDER IMPLANT  01/22/2014   MDT LINQ implanted by Dr Alfonso Ellis at [facility] for evaluation of palpitations/syncope  . TILT TABLE STUDY  2011   neurally mediated syncope    OB History    No data available       Home Medications    Prior to Admission medications   Medication Sig Start Date End Date Taking? Authorizing Provider  Ascorbic Acid (VITAMIN C) 1000 MG tablet Take 1,000 mg by mouth daily.   Yes Historical Provider, MD  Calcium-Magnesium-Vitamin D (CALCIUM MAGNESIUM PO) Take 2 tablets by mouth daily.   Yes Historical Provider, MD  Cholecalciferol (D-5000) 5000 UNITS TABS Take 5,000 Units by mouth daily.   Yes Historical Provider, MD  Coenzyme Q10 (COQ10 PO) Take 1 tablet by mouth daily.   Yes Historical Provider, MD  Cyanocobalamin (RA VITAMIN B-12 TR) 1000 MCG TBCR Take 1,000 mcg by mouth daily.   Yes Historical Provider, MD  D-Ribose (RIBOSE, D,) POWD Take 200  g by mouth daily.   Yes Historical Provider, MD  ferrous gluconate (FERGON) 324 MG tablet Take 1 tablet (324 mg total) by mouth daily with breakfast. 07/18/16  Yes Josalyn Funches, MD  ibuprofen (ADVIL,MOTRIN) 600 MG tablet Take 1 tablet (600 mg total) by mouth every 8 (eight) hours as needed. On day before and first few days of period 05/06/16  Yes Josalyn Funches, MD  lactulose (CHRONULAC) 10 GM/15ML solution Take 30 mLs (20 g total) by mouth daily.  07/18/16  Yes Josalyn Funches, MD  Multiple Vitamin (MULTIVITAMIN WITH MINERALS) TABS tablet Take 1 tablet by mouth daily.   Yes Historical Provider, MD  Omega-3 1000 MG CAPS Take 1 capsule by mouth daily.   Yes Historical Provider, MD  Probiotic Product (PROBIOTIC PO) Take 1 tablet by mouth daily.   Yes Historical Provider, MD  senna-docusate (SENOKOT-S) 8.6-50 MG tablet Take 1 tablet by mouth 2 (two) times daily. 07/18/16  Yes Josalyn Funches, MD  sodium chloride 1 G tablet Take 1 g by mouth 2 (two) times daily.   Yes Historical Provider, MD    Family History Family History  Problem Relation Age of Onset  . Diabetes Mother   . Hypertension Mother   . Stroke Mother   . Hypertension Brother   . Hyperlipidemia Maternal Grandmother   . Seizures Neg Hx     Social History Social History  Substance Use Topics  . Smoking status: Never Smoker  . Smokeless tobacco: Never Used  . Alcohol use No     Allergies   Amitiza [lubiprostone]; Other; Ketorolac; Metoclopramide; Midodrine; and Fludrocortisone   Review of Systems Review of Systems  Neurological: Positive for syncope and headaches.  All other systems reviewed and are negative.    Physical Exam Updated Vital Signs BP 114/62 (BP Location: Right Arm)   Pulse 83   Temp 98.7 F (37.1 C) (Oral)   Resp 20   Ht  (1.6 m)   Wt 68 kg   LMP 07/15/2016 (Exact Date)   SpO2 100%   BMI 26.57 kg/m   Physical Exam  Constitutional: She is oriented to person, place, and time. She appears well-developed and well-nourished. No distress.  HENT:  Head: Normocephalic and atraumatic. Head is without raccoon's eyes, without Battle's sign, without abrasion, without contusion and without laceration.    Right Ear: Tympanic membrane normal. No hemotympanum.  Left Ear: Tympanic membrane normal. No hemotympanum.  Nose: Nose normal. No sinus tenderness, nasal deformity, septal deviation or nasal septal hematoma. No epistaxis.  Mouth/Throat:  Uvula is midline, oropharynx is clear and moist and mucous membranes are normal. No oropharyngeal exudate, posterior oropharyngeal edema, posterior oropharyngeal erythema or tonsillar abscesses.  Eyes: Conjunctivae and EOM are normal. Pupils are equal, round, and reactive to light. Right eye exhibits no discharge. Left eye exhibits no discharge. No scleral icterus.  Neck: Normal range of motion and full passive range of motion without pain. Neck supple. No spinous process tenderness and no muscular tenderness present. No neck rigidity. No edema, no erythema and normal range of motion present.  Cardiovascular: Normal rate, regular rhythm, normal heart sounds and intact distal pulses.   Pulmonary/Chest: Effort normal and breath sounds normal. No respiratory distress. She has no wheezes. She has no rales. She exhibits no tenderness.  Abdominal: Soft. Bowel sounds are normal. She exhibits no distension and no mass. There is no tenderness. There is no rebound and no guarding.  Musculoskeletal: Normal range of motion. She exhibits no edema.  No cervical, thoracic, or lumbar spine midline TTP.  Full ROM of bilateral upper and lower extremities with 5/5 strength.   2+ radial and PT pulses. Sensation grossly intact.  Pt able to stand and ambulate, no ataxia noted.   Lymphadenopathy:    She has no cervical adenopathy.  Neurological: She is alert and oriented to person, place, and time. She has normal strength. No cranial nerve deficit or sensory deficit. Coordination and gait normal.  Skin: Skin is warm and dry. She is not diaphoretic.  Nursing note and vitals reviewed.    ED Treatments / Results  Labs (all labs ordered are listed, but only abnormal results are displayed) Labs Reviewed - No data to display  EKG  EKG Interpretation None       Radiology Ct Head Wo Contrast  Result Date: 07/28/2016 CLINICAL DATA:  Recent syncopal episode with head injury, initial encounter EXAM: CT HEAD WITHOUT  CONTRAST TECHNIQUE: Contiguous axial images were obtained from the base of the skull through the vertex without intravenous contrast. COMPARISON:  04/19/16 FINDINGS: Brain: No evidence of acute infarction, hemorrhage, hydrocephalus, extra-axial collection or mass lesion/mass effect. Vascular: No hyperdense vessel or unexpected calcification. Skull: Normal. Negative for fracture or focal lesion. Sinuses/Orbits: No acute finding. Other: None. IMPRESSION: No acute intracranial abnormality noted. Electronically Signed   By: Alcide Clever M.D.   On: 07/28/2016 14:41    Procedures Procedures (including critical care time)  Medications Ordered in ED Medications  acetaminophen (TYLENOL) tablet 1,000 mg (1,000 mg Oral Given 07/28/16 1256)     Initial Impression / Assessment and Plan / ED Course  I have reviewed the triage vital signs and the nursing notes.  Pertinent labs & imaging results that were available during my care of the patient were reviewed by me and considered in my medical decision making (see chart for details).     Patient presents after a witnessed syncopal episode that occurred while she was at her dentist office with reported head injury due to falling out of the examination chair. Reports having HA. Hx of vasovagal syncope and POTS, followed by cardiology at [facility]. Pt with care plan in place which was reviewed by myself. VSS. Exam unremarkable, no evidence of head injury or trauma. No neuro deficits. Patient able to stand and ambulate without assistance, no ataxia noted. Patient reports syncopal episode is consistent with her typical syncopal episodes. EKG showed sinus rhythm with no acute ischemic changes. Due to patient with reported head injury during syncopal episode and endorsing continued headache, plan to give patient Tylenol in the ED in order head CT for further evaluation. CT head negative. On reevaluation patient is resting comfortably in bed and reports significant improvement  of headache. Discussed results of plan for discharge with patient. Advised patient to follow up with PCP as needed and follow up with her cardiologist at her scheduled appointment in May for further evaluation of her syncopal episodes. Discussed return precautions.  Final Clinical Impressions(s) / ED Diagnoses   Final diagnoses:  Syncope, unspecified syncope type  Injury of head, initial encounter    New Prescriptions Discharge Medication List as of 07/28/2016  2:59 PM       Barrett Henle, PA-C 07/28/16 1503    Benjiman Core, MD 07/28/16 1600

## 2016-07-28 NOTE — Discharge Instructions (Signed)
Continue taking her medications as prescribed. I recommended he can take Tylenol as prescribed over-the-counter as needed for your headache. I recommend following up with your primary care provider early next week as needed. Follow-up with your cardiologist at your scheduled appointment in May for further evaluation of near-syncopal episodes. Please return to the Emergency Department if symptoms worsen or new onset of fever, headache, dizziness, neck pain/stiffness, visual changes, chest pain, shortness of breath, abdominal pain, vomiting, numbness, weakness, seizure.

## 2016-07-28 NOTE — ED Notes (Signed)
Pt ambulating in room to bathroom pt noted to have steady gait

## 2016-07-28 NOTE — ED Triage Notes (Signed)
Pt arrives awake and alert and in no distress pt passed out at the dentist office and is having pain in her head from falling our the dental chair

## 2016-07-29 ENCOUNTER — Encounter (HOSPITAL_COMMUNITY): Payer: Self-pay

## 2016-08-01 ENCOUNTER — Encounter (HOSPITAL_COMMUNITY): Payer: Self-pay

## 2016-08-03 ENCOUNTER — Encounter (HOSPITAL_COMMUNITY): Payer: Self-pay

## 2016-08-05 ENCOUNTER — Encounter (HOSPITAL_COMMUNITY): Payer: Self-pay

## 2016-08-08 ENCOUNTER — Encounter (HOSPITAL_COMMUNITY): Payer: Self-pay

## 2016-08-10 ENCOUNTER — Encounter (HOSPITAL_COMMUNITY): Payer: Self-pay

## 2016-08-12 ENCOUNTER — Encounter (HOSPITAL_COMMUNITY): Payer: Self-pay

## 2016-08-15 ENCOUNTER — Encounter (HOSPITAL_COMMUNITY): Payer: Self-pay

## 2016-08-17 ENCOUNTER — Encounter (HOSPITAL_COMMUNITY): Payer: Self-pay

## 2016-08-17 ENCOUNTER — Encounter: Payer: Self-pay | Admitting: Family Medicine

## 2016-08-19 ENCOUNTER — Encounter (HOSPITAL_COMMUNITY): Payer: Self-pay

## 2016-08-19 ENCOUNTER — Emergency Department (HOSPITAL_COMMUNITY)
Admission: EM | Admit: 2016-08-19 | Discharge: 2016-08-19 | Disposition: A | Discharge status: Home / Self Care | Payer: Medicaid Other | Attending: Emergency Medicine | Admitting: Emergency Medicine

## 2016-08-19 DIAGNOSIS — Z79899 Other long term (current) drug therapy: Secondary | ICD-10-CM | POA: Diagnosis not present

## 2016-08-19 DIAGNOSIS — H6092 Unspecified otitis externa, left ear: Secondary | ICD-10-CM | POA: Diagnosis not present

## 2016-08-19 DIAGNOSIS — H60502 Unspecified acute noninfective otitis externa, left ear: Secondary | ICD-10-CM

## 2016-08-19 DIAGNOSIS — H6012 Cellulitis of left external ear: Secondary | ICD-10-CM | POA: Insufficient documentation

## 2016-08-19 DIAGNOSIS — H9202 Otalgia, left ear: Secondary | ICD-10-CM | POA: Diagnosis present

## 2016-08-19 MED ORDER — CEPHALEXIN 250 MG PO CAPS: 250.0000 mg | ORAL_CAPSULE | Freq: Four times a day (QID) | ORAL | 0 refills | Status: DC

## 2016-08-19 MED ORDER — CIPROFLOXACIN-DEXAMETHASONE 0.3-0.1 % OT SUSP: 4.0000 [drp] | Freq: Two times a day (BID) | OTIC | 0 refills | Status: DC

## 2016-08-19 NOTE — ED Triage Notes (Signed)
Pt endorses left ear pain with headache. Denies fevers/chills or any other sx. VSS.

## 2016-08-19 NOTE — ED Provider Notes (Signed)
[facility]-EMERGENCY DEPT Provider Note   CSN: [id number] Arrival date & time: 08/19/16  0019     History   Chief Complaint Chief Complaint  Patient presents with  . Otalgia    HPI Desiree Gutierrez is a 26 y.o. female.  Patient presents to the emergency department with chief complaint of left ear pain. She states pain started earlier today. She reports associated headache. She denies any fevers, chills, nausea, vomiting. Denies any changes to her hearing. She states that she has been swimming recently. Has tried taking Advil with no relief. There are no other associated symptoms or modifying factors.   The history is provided by the patient. No language interpreter was used.    Past Medical History:  Diagnosis Date  . Headache   . Palpitations    a. s/p MDT ILR implanted by Dr Alfonso Ellis [facility])  . POTS (postural orthostatic tachycardia syndrome)   . Vasovagal syncope    a. positive tilt 2011 b. failed medical therapy with Florinef, Midodrine, Inderal, Celexa  . WPW (Wolff-Parkinson-White syndrome)    a. s/p ablation (2011) Dr Gevena Cotton at [facility] (posterior lateral pathway)    Patient Active Problem List   Diagnosis Date Noted  . Purpura (HCC) 10/15/2015  . Dyspnea and respiratory abnormality 09/08/2015  . Diastolic dysfunction 09/08/2015  . Mold exposure 08/10/2015  . Vocal cord dysfunction 06/02/2015  . Stress at home 05/22/2015  . SOB (shortness of breath) 04/30/2015  . Pain of molar 02/13/2015  . Observed seizure-like activity (HCC) 02/02/2015  . Lower abdominal pain 11/06/2014  . Fall   . Hypoglycemia   . POTS (postural orthostatic tachycardia syndrome) 06/01/2014  . Iron deficiency anemia 06/01/2014  . Tachycardia 05/29/2014  . Syncope 12/03/2010  . WOLFF-PARKINSON-WHITE (WPW) SYNDROME status post ablation 08/20/2009  . Constipation 08/19/2009  . Chest pain 08/19/2009    Past Surgical History:  Procedure Laterality Date  . CARDIAC ELECTROPHYSIOLOGY STUDY AND  ABLATION  2011   Dr. Gevena Cotton at [facility]- left posterior pathway ablation for WPW  . LOOP RECORDER IMPLANT  01/22/2014   MDT LINQ implanted by Dr Alfonso Ellis at [facility] for evaluation of palpitations/syncope  . TILT TABLE STUDY  2011   neurally mediated syncope    OB History    No data available       Home Medications    Prior to Admission medications   Medication Sig Start Date End Date Taking? Authorizing Provider  Ascorbic Acid (VITAMIN C) 1000 MG tablet Take 1,000 mg by mouth daily.    [provider]  Calcium-Magnesium-Vitamin D (CALCIUM MAGNESIUM PO) Take 2 tablets by mouth daily.    [provider]  Cholecalciferol (D-5000) 5000 UNITS TABS Take 5,000 Units by mouth daily.    [provider]  Coenzyme Q10 (COQ10 PO) Take 1 tablet by mouth daily.    [provider]  Cyanocobalamin (RA VITAMIN B-12 TR) 1000 MCG TBCR Take 1,000 mcg by mouth daily.    [provider]  D-Ribose (RIBOSE, D,) POWD Take 200 g by mouth daily.    [provider]  ferrous gluconate (FERGON) 324 MG tablet Take 1 tablet (324 mg total) by mouth daily with breakfast. 07/18/16   [name], MD  ibuprofen (ADVIL,MOTRIN) 600 MG tablet Take 1 tablet (600 mg total) by mouth every 8 (eight) hours as needed. On day before and first few days of period 05/06/16   [name], MD  lactulose (CHRONULAC) 10 GM/15ML solution Take 30 mLs (20 g total) by mouth  daily. 07/18/16   Dessa PhiFunches, Josalyn, MD  Multiple Vitamin (MULTIVITAMIN WITH MINERALS) TABS tablet Take 1 tablet by mouth daily.    [provider]  Omega-3 1000 MG CAPS Take 1 capsule by mouth daily.    [provider]  Probiotic Product (PROBIOTIC PO) Take 1 tablet by mouth daily.    [provider]  senna-docusate (SENOKOT-S) 8.6-50 MG tablet Take 1 tablet by mouth 2 (two) times daily. 07/18/16   Funches, Gerilyn NestleJosalyn, MD  sodium chloride 1 G tablet Take 1 g by mouth 2 (two) times daily.     [provider]    Family History Family History  Problem Relation Age of Onset  . Diabetes Mother   . Hypertension Mother   . Stroke Mother   . Hypertension Brother   . Hyperlipidemia Maternal Grandmother   . Seizures Neg Hx     Social History Social History  Substance Use Topics  . Smoking status: Never Smoker  . Smokeless tobacco: Never Used  . Alcohol use No     Allergies   Amitiza [lubiprostone]; Other; Ketorolac; Metoclopramide; Midodrine; and Fludrocortisone   Review of Systems Review of Systems  All other systems reviewed and are negative.    Physical Exam Updated Vital Signs BP 116/88 (BP Location: Right Arm)   Pulse 72   Temp 98.2 F (36.8 C) (Oral)   Resp 16   Ht 5\' 3"  (1.6 m)   Wt 156 lb (70.8 kg)   LMP 08/12/2016 (Exact Date)   SpO2 100%   BMI 27.63 kg/m   Physical Exam  Constitutional: She is oriented to person, place, and time. She appears well-developed and well-nourished.  HENT:  Head: Normocephalic and atraumatic.  Left outer ear is mildly erythematous, no abscess, there is some edema of the left ear canal  Eyes: Conjunctivae and EOM are normal. Pupils are equal, round, and reactive to light.  Neck: Normal range of motion. Neck supple.  Cardiovascular: Normal rate and regular rhythm.  Exam reveals no gallop and no friction rub.   No murmur heard. Pulmonary/Chest: Effort normal and breath sounds normal. No respiratory distress. She has no wheezes. She has no rales. She exhibits no tenderness.  Abdominal: Soft. Bowel sounds are normal. She exhibits no distension and no mass. There is no tenderness. There is no rebound and no guarding.  Musculoskeletal: Normal range of motion. She exhibits no edema or tenderness.  Neurological: She is alert and oriented to person, place, and time.  Skin: Skin is warm and dry.  Psychiatric: She has a normal mood and affect. Her behavior is normal. Judgment and thought content normal.  Nursing note  and vitals reviewed.    ED Treatments / Results  Labs (all labs ordered are listed, but only abnormal results are displayed) Labs Reviewed - No data to display  EKG  EKG Interpretation None       Radiology No results found.  Procedures Procedures (including critical care time)  Medications Ordered in ED Medications - No data to display   Initial Impression / Assessment and Plan / ED Course  I have reviewed the triage vital signs and the nursing notes.  Pertinent labs & imaging results that were available during my care of the patient were reviewed by me and considered in my medical decision making (see chart for details).     Patient seems to have a mild cellulitis of the left auricle, but could also be developing otitis externa. Will treat with Keflex and Ciprodex  drops.  Final Clinical Impressions(s) / ED Diagnoses   Final diagnoses:  Acute otitis externa of left ear, unspecified type  Cellulitis of left pinna    New Prescriptions New Prescriptions   No medications on file     Roxy Horseman, Cordelia Poche 08/19/16 0247    Glynn Octave, MD 08/19/16 [phone]

## 2016-08-22 ENCOUNTER — Encounter (HOSPITAL_COMMUNITY): Payer: Self-pay

## 2016-08-24 ENCOUNTER — Encounter (HOSPITAL_COMMUNITY): Payer: Self-pay

## 2016-08-26 ENCOUNTER — Encounter (HOSPITAL_COMMUNITY): Payer: Self-pay

## 2016-08-31 ENCOUNTER — Encounter (HOSPITAL_COMMUNITY): Payer: Self-pay

## 2016-09-07 ENCOUNTER — Encounter (HOSPITAL_COMMUNITY): Payer: Self-pay | Admitting: *Deleted

## 2016-09-07 ENCOUNTER — Emergency Department (HOSPITAL_COMMUNITY)
Admission: EM | Admit: 2016-09-07 | Discharge: 2016-09-07 | Disposition: A | Discharge status: Home / Self Care | Payer: Medicaid Other | Attending: Emergency Medicine | Admitting: Emergency Medicine

## 2016-09-07 DIAGNOSIS — Y939 Activity, unspecified: Secondary | ICD-10-CM | POA: Insufficient documentation

## 2016-09-07 DIAGNOSIS — R42 Dizziness and giddiness: Secondary | ICD-10-CM | POA: Diagnosis not present

## 2016-09-07 DIAGNOSIS — Y999 Unspecified external cause status: Secondary | ICD-10-CM | POA: Diagnosis not present

## 2016-09-07 DIAGNOSIS — Y92410 Unspecified street and highway as the place of occurrence of the external cause: Secondary | ICD-10-CM | POA: Insufficient documentation

## 2016-09-07 DIAGNOSIS — Z79899 Other long term (current) drug therapy: Secondary | ICD-10-CM | POA: Diagnosis not present

## 2016-09-07 DIAGNOSIS — R51 Headache: Secondary | ICD-10-CM | POA: Diagnosis not present

## 2016-09-07 DIAGNOSIS — R519 Headache, unspecified: Secondary | ICD-10-CM

## 2016-09-07 MED ORDER — ACETAMINOPHEN 500 MG PO TABS: 1000.0000 mg | ORAL_TABLET | Freq: Once | ORAL | Status: DC

## 2016-09-07 NOTE — Discharge Instructions (Signed)
Please take Ibuprofen (Advil, motrin) and Tylenol (acetaminophen) to relieve your pain.  You may take up to 800 MG (4 pills) of normal strength ibuprofen every 8 hours as needed.  In between doses of ibuprofen you make take tylenol, up to 1,000 mg (two extra strength pills).  Do not take more than 3,000 mg tylenol in a 24 hour period.  Please check all medication labels as many medications such as pain and cold medications may contain tylenol.  Do not drink alcohol while taking these medications.  Do not take other NSAID'S while taking ibuprofen (such as aleve or naproxen). ° °
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
## 2016-09-07 NOTE — ED Notes (Signed)
ED Provider at bedside. 
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
## 2016-09-19 ENCOUNTER — Ambulatory Visit: Payer: Medicaid Other | Attending: Family Medicine | Admitting: Family Medicine

## 2016-09-19 ENCOUNTER — Encounter: Payer: Self-pay | Admitting: Family Medicine

## 2016-09-19 ENCOUNTER — Ambulatory Visit (HOSPITAL_COMMUNITY)
Admission: RE | Admit: 2016-09-19 | Discharge: 2016-09-19 | Disposition: A | Discharge status: Home / Self Care | Payer: Medicaid Other | Source: Ambulatory Visit | Attending: Family Medicine | Admitting: Family Medicine

## 2016-09-19 VITALS — BP 102/65 | HR 95 | Temp 98.0°F | Wt 154.4 lb

## 2016-09-19 DIAGNOSIS — Z79899 Other long term (current) drug therapy: Secondary | ICD-10-CM | POA: Diagnosis not present

## 2016-09-19 DIAGNOSIS — K5901 Slow transit constipation: Secondary | ICD-10-CM | POA: Insufficient documentation

## 2016-09-19 DIAGNOSIS — I456 Pre-excitation syndrome: Secondary | ICD-10-CM | POA: Insufficient documentation

## 2016-09-19 DIAGNOSIS — S060X0D Concussion without loss of consciousness, subsequent encounter: Secondary | ICD-10-CM | POA: Diagnosis not present

## 2016-09-19 DIAGNOSIS — Z9889 Other specified postprocedural states: Secondary | ICD-10-CM | POA: Diagnosis not present

## 2016-09-19 DIAGNOSIS — R42 Dizziness and giddiness: Secondary | ICD-10-CM | POA: Diagnosis not present

## 2016-09-19 DIAGNOSIS — R55 Syncope and collapse: Secondary | ICD-10-CM | POA: Insufficient documentation

## 2016-09-19 DIAGNOSIS — R140 Abdominal distension (gaseous): Secondary | ICD-10-CM | POA: Diagnosis not present

## 2016-09-19 DIAGNOSIS — S060X0A Concussion without loss of consciousness, initial encounter: Secondary | ICD-10-CM | POA: Insufficient documentation

## 2016-09-19 MED ORDER — GLYCERIN (ADULT) 2 G RE SUPP: 1.0000 | RECTAL | 0 refills | Status: DC | PRN

## 2016-09-19 MED ORDER — CYCLOBENZAPRINE HCL 5 MG PO TABS: 5.0000 mg | ORAL_TABLET | Freq: Every day | ORAL | 0 refills | Status: DC

## 2016-09-19 MED FILL — CYCLOBENZAPRINE 5 MG TABLET: 5 | 10 days supply | Qty: 10 | Fill #0
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
## 2016-09-19 NOTE — Assessment & Plan Note (Signed)
Chronic constipation Persistent stool burden Lactulose and colace to be continued. Glycerin supp prn hard stool

## 2016-09-22 ENCOUNTER — Telehealth: Payer: Self-pay

## 2016-09-22 NOTE — Telephone Encounter (Signed)
Pt was called and informed of lab results. 

## 2016-10-27 ENCOUNTER — Ambulatory Visit: Payer: Medicaid Other | Attending: Family Medicine | Admitting: Family Medicine

## 2016-10-27 ENCOUNTER — Encounter: Payer: Self-pay | Admitting: Family Medicine

## 2016-10-27 VITALS — BP 104/69 | HR 85 | Temp 98.2°F | Ht 63.0 in | Wt 155.8 lb

## 2016-10-27 DIAGNOSIS — S060X0D Concussion without loss of consciousness, subsequent encounter: Secondary | ICD-10-CM | POA: Insufficient documentation

## 2016-10-27 DIAGNOSIS — R55 Syncope and collapse: Secondary | ICD-10-CM | POA: Insufficient documentation

## 2016-10-27 DIAGNOSIS — R42 Dizziness and giddiness: Secondary | ICD-10-CM | POA: Insufficient documentation

## 2016-10-27 DIAGNOSIS — K5901 Slow transit constipation: Secondary | ICD-10-CM | POA: Diagnosis not present

## 2016-10-27 DIAGNOSIS — R51 Headache: Secondary | ICD-10-CM | POA: Insufficient documentation

## 2016-10-27 NOTE — Assessment & Plan Note (Signed)
Advised prune juice

## 2016-10-27 NOTE — Assessment & Plan Note (Signed)
Improving headache and beck pain Referral to vestibular rehab for dizziness

## 2016-10-27 NOTE — Patient Instructions (Addendum)
Desiree Gutierrez was seen today for headache.  Diagnoses and all orders for this visit:  Concussion without loss of consciousness, subsequent encounter -     PT Vestibular Evaluation; Future  Vertigo -     PT Vestibular Evaluation; Future  Slow transit constipation  try warm prune juice for constipation   F/u in 8 weeks for flu shot/concussion syndrome  Dr. Armen PickupFunches
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
## 2017-01-05 ENCOUNTER — Encounter: Payer: Self-pay | Admitting: Internal Medicine

## 2017-01-05 ENCOUNTER — Ambulatory Visit: Payer: Medicaid Other | Attending: Internal Medicine | Admitting: Internal Medicine

## 2017-01-05 VITALS — BP 106/73 | HR 96 | Temp 99.1°F | Resp 16 | Wt 153.2 lb

## 2017-01-05 DIAGNOSIS — Z79899 Other long term (current) drug therapy: Secondary | ICD-10-CM | POA: Diagnosis not present

## 2017-01-05 DIAGNOSIS — Z2821 Immunization not carried out because of patient refusal: Secondary | ICD-10-CM | POA: Diagnosis not present

## 2017-01-05 DIAGNOSIS — K5901 Slow transit constipation: Secondary | ICD-10-CM | POA: Diagnosis not present

## 2017-01-05 DIAGNOSIS — Z823 Family history of stroke: Secondary | ICD-10-CM | POA: Insufficient documentation

## 2017-01-05 DIAGNOSIS — Z8249 Family history of ischemic heart disease and other diseases of the circulatory system: Secondary | ICD-10-CM | POA: Diagnosis not present

## 2017-01-05 DIAGNOSIS — Z9889 Other specified postprocedural states: Secondary | ICD-10-CM | POA: Diagnosis not present

## 2017-01-05 DIAGNOSIS — Z888 Allergy status to other drugs, medicaments and biological substances status: Secondary | ICD-10-CM | POA: Insufficient documentation

## 2017-01-05 DIAGNOSIS — G44309 Post-traumatic headache, unspecified, not intractable: Secondary | ICD-10-CM | POA: Diagnosis not present

## 2017-01-05 DIAGNOSIS — Z833 Family history of diabetes mellitus: Secondary | ICD-10-CM | POA: Insufficient documentation

## 2017-01-05 NOTE — Patient Instructions (Signed)
Try drinking prone juice a few times a week to help decrease constipation. Can also try Sennokot over the counter.   I have referred you to neurology for the headaches which I think are migraines.   Migraine Headache A migraine headache is an intense, throbbing pain on one side or both sides of the head. Migraines may also cause other symptoms, such as nausea, vomiting, and sensitivity to light and noise. What are the causes? Doing or taking certain things may also trigger migraines, such as:  Alcohol.  Smoking.  Medicines, such as: ? Medicine used to treat chest pain (nitroglycerine). ? Birth control pills. ? Estrogen pills. ? Certain blood pressure medicines.  Aged cheeses, chocolate, or caffeine.  Foods or drinks that contain nitrates, glutamate, aspartame, or tyramine.  Physical activity.  Other things that may trigger a migraine include:  Menstruation.  Pregnancy.  Hunger.  Stress, lack of sleep, too much sleep, or fatigue.  Weather changes.  What increases the risk? The following factors may make you more likely to experience migraine headaches:  Age. Risk increases with age.  Family history of migraine headaches.  Being Caucasian.  Depression and anxiety.  Obesity.  Being a woman.  Having a hole in the heart (patent foramen ovale) or other heart problems.  What are the signs or symptoms? The main symptom of this condition is pulsating or throbbing pain. Pain may:  Happen in any area of the head, such as on one side or both sides.  Interfere with daily activities.  Get worse with physical activity.  Get worse with exposure to bright lights or loud noises.  Other symptoms may include:  Nausea.  Vomiting.  Dizziness.  General sensitivity to bright lights, loud noises, or smells.  Before you get a migraine, you may get warning signs that a migraine is developing (aura). An aura may include:  Seeing flashing lights or having blind  spots.  Seeing bright spots, halos, or zigzag lines.  Having tunnel vision or blurred vision.  Having numbness or a tingling feeling.  Having trouble talking.  Having muscle weakness.  How is this diagnosed? A migraine headache can be diagnosed based on:  Your symptoms.  A physical exam.  Tests, such as CT scan or MRI of the head. These imaging tests can help rule out other causes of headaches.  Taking fluid from the spine (lumbar puncture) and analyzing it (cerebrospinal fluid analysis, or CSF analysis).  How is this treated? A migraine headache is usually treated with medicines that:  Relieve pain.  Relieve nausea.  Prevent migraines from coming back.  Treatment may also include:  Acupuncture.  Lifestyle changes like avoiding foods that trigger migraines.  Follow these instructions at home: Medicines  Take over-the-counter and prescription medicines only as told by your health care provider.  Do not drive or use heavy machinery while taking prescription pain medicine.  To prevent or treat constipation while you are taking prescription pain medicine, your health care provider may recommend that you: ? Drink enough fluid to keep your urine clear or pale yellow. ? Take over-the-counter or prescription medicines. ? Eat foods that are high in fiber, such as fresh fruits and vegetables, whole grains, and beans. ? Limit foods that are high in fat and processed sugars, such as fried and sweet foods. Lifestyle  Avoid alcohol use.  Do not use any products that contain nicotine or tobacco, such as cigarettes and e-cigarettes. If you need help quitting, ask your health care provider.  Get at least 8 hours of sleep every night.  Limit your stress. General instructions   Keep a journal to find out what may trigger your migraine headaches. For example, write down: ? What you eat and drink. ? How much sleep you get. ? Any change to your diet or medicines.  If you  have a migraine: ? Avoid things that make your symptoms worse, such as bright lights. ? It may help to lie down in a dark, quiet room. ? Do not drive or use heavy machinery. ? Ask your health care provider what activities are safe for you while you are experiencing symptoms.  Keep all follow-up visits as told by your health care provider. This is important. Contact a health care provider if:  You develop symptoms that are different or more severe than your usual migraine symptoms. Get help right away if:  Your migraine becomes severe.  You have a fever.  You have a stiff neck.  You have vision loss.  Your muscles feel weak or like you cannot control them.  You start to lose your balance often.  You develop trouble walking.  You faint. This information is not intended to replace advice given to you by your health care provider. Make sure you discuss any questions you have with your health care provider. Document Released: 03/21/2005 Document Revised: 10/09/2015 Document Reviewed: 09/07/2015 Elsevier Interactive Patient Education  2017 [employer].
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
## 2017-03-14 IMAGING — CR DG CHEST 2V
2 series · 2 of 2 positions shown · non-contrast
Comparison: 09/03/2014

CLINICAL DATA: Chest discomfort, shortness of breath for 2 hours

EXAM:
CHEST  2 VIEW

[chest pa]
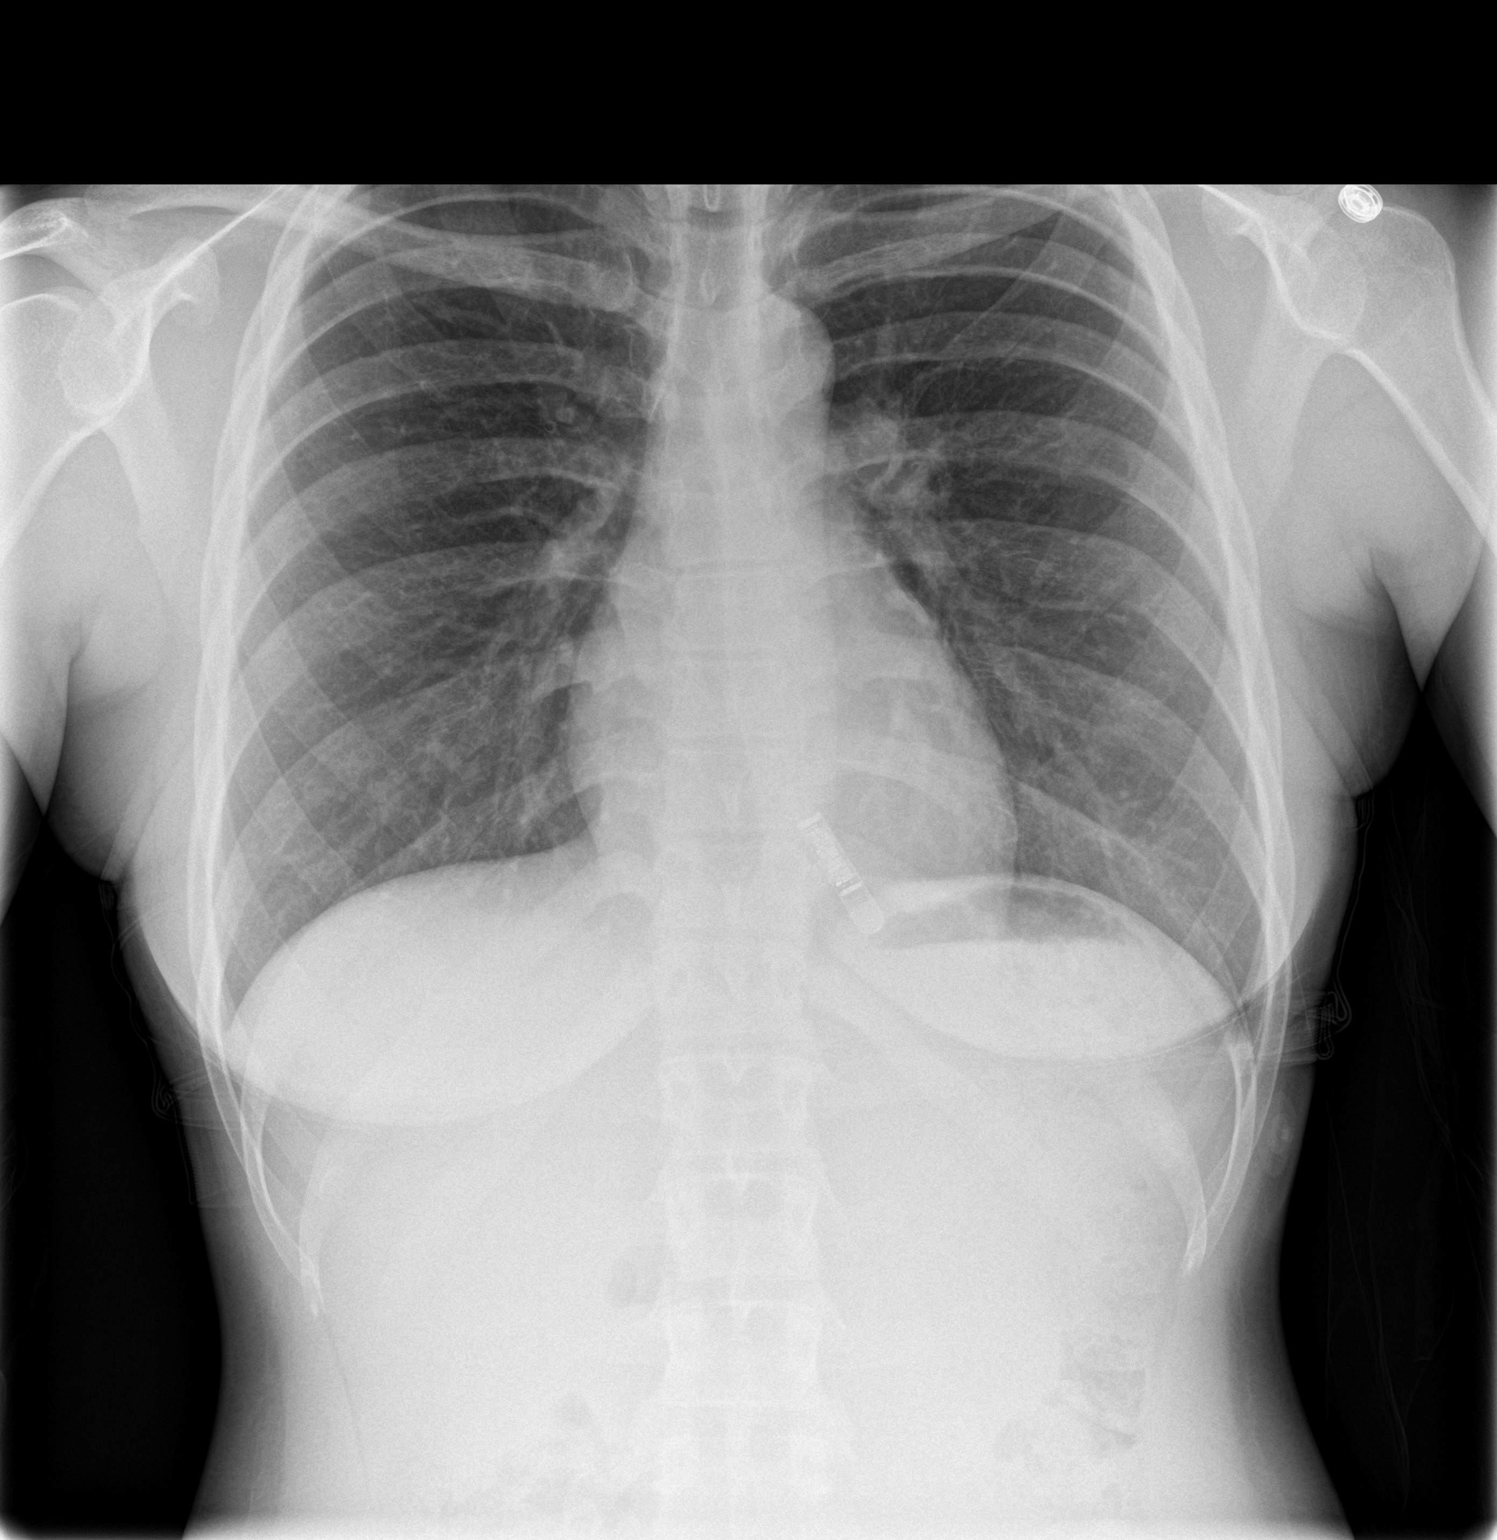

[chest lat]
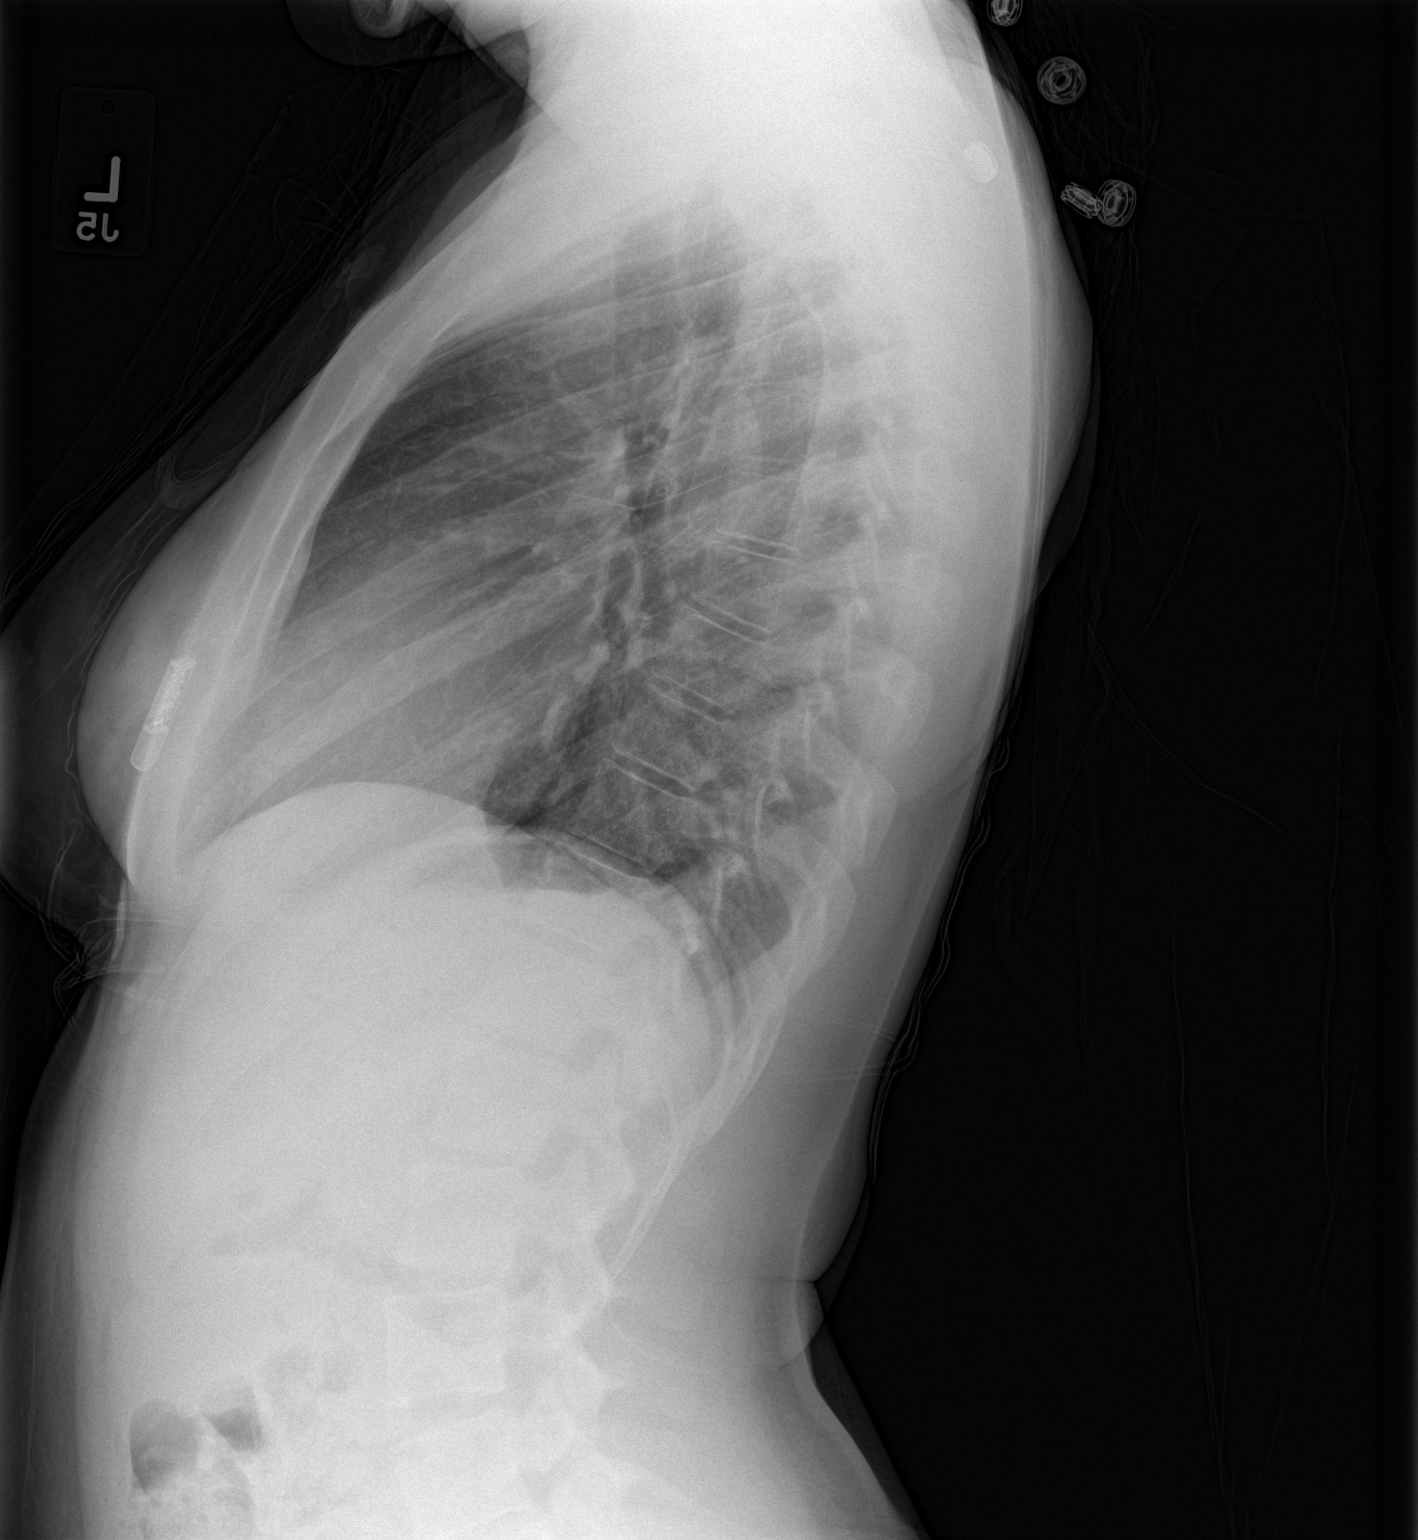

[2 of 2 positions shown; findings below may reference images not displayed]

FINDINGS: The heart size and mediastinal contours are within normal limits.
Both lungs are clear. The visualized skeletal structures are
unremarkable.
IMPRESSION: No active cardiopulmonary disease.

## 2017-04-11 ENCOUNTER — Ambulatory Visit: Payer: Medicaid Other | Admitting: Internal Medicine

## 2017-04-13 ENCOUNTER — Ambulatory Visit: Payer: Medicaid Other | Attending: Internal Medicine | Admitting: Internal Medicine

## 2017-04-13 ENCOUNTER — Encounter: Payer: Self-pay | Admitting: Internal Medicine

## 2017-04-13 VITALS — BP 102/68 | HR 88 | Temp 98.9°F | Resp 16 | Wt 156.6 lb

## 2017-04-13 DIAGNOSIS — D509 Iron deficiency anemia, unspecified: Secondary | ICD-10-CM | POA: Insufficient documentation

## 2017-04-13 DIAGNOSIS — Z9889 Other specified postprocedural states: Secondary | ICD-10-CM | POA: Diagnosis present

## 2017-04-13 DIAGNOSIS — D692 Other nonthrombocytopenic purpura: Secondary | ICD-10-CM | POA: Diagnosis not present

## 2017-04-13 DIAGNOSIS — N946 Dysmenorrhea, unspecified: Secondary | ICD-10-CM | POA: Diagnosis not present

## 2017-04-13 DIAGNOSIS — K5901 Slow transit constipation: Secondary | ICD-10-CM | POA: Diagnosis not present

## 2017-04-13 DIAGNOSIS — Z79899 Other long term (current) drug therapy: Secondary | ICD-10-CM | POA: Insufficient documentation

## 2017-04-13 DIAGNOSIS — N920 Excessive and frequent menstruation with regular cycle: Secondary | ICD-10-CM | POA: Insufficient documentation

## 2017-04-13 MED ORDER — IBUPROFEN 600 MG PO TABS: 600.0000 mg | ORAL_TABLET | Freq: Three times a day (TID) | ORAL | 2 refills | Status: DC | PRN

## 2017-04-13 MED FILL — IBUPROFEN 600 MG TABLET: 600 | 20 days supply | Qty: 60 | Fill #0

## 2017-04-13 NOTE — Progress Notes (Signed)
Patient ID: Desiree Gutierrez, female    DOB: 1990/04/23  MRN: [id number]  CC: Follow-up (3 month )   Subjective: Desiree Gutierrez is a 27 y.o. female who presents for routine f/u visit.  Her concerns today include:  Pt with hx of POTS syndrome, WPW s/p ablation 2011, constipation, IDA. Followed by cardiology at [facility]  1. Constipation: Tried the Senokot S.  Able to go but still painful and passes small balls. -some blood on toilet paper after BMs. This is chronic Can go up to 5 days without a bowel movement.  Last bowel movement was 5 days ago. she eats green leafy veggies at least daily, drinks plenty of water.  -saw GI 2 yrs ago, Dr. Leanora Ivanoff and given Amitza. Had bad reaction - hives.  2.  Saw her cardiologist 03/2017. No new meds  3. Menses are heavy and painful lasting 7 days.  First 3 days are the heaviest.  -passes clots; more than she use to in past Feels tired before and after menses -comes on  Q4-5 wks -Was  Ibuprofen 600 mg ibuprofen in the past by Dr. Armen Pickup and found this helpful for the dysmenorrhea. Had pelvic ultrasound 05/2015 that was negative for any uterine fibroids. Reports a discussion was had in the past about BCP but she thinks there was some contraindication related to her heart condition.  She does not recall the details Patient Active Problem List   Diagnosis Date Noted  . Vertigo 10/27/2016  . Concussion with no loss of consciousness 09/19/2016  . Purpura (HCC) 10/15/2015  . Diastolic dysfunction 09/08/2015  . Vocal cord dysfunction 06/02/2015  . Pain of molar 02/13/2015  . Observed seizure-like activity (HCC) 02/02/2015  . Hypoglycemia   . POTS (postural orthostatic tachycardia syndrome) 06/01/2014  . Iron deficiency anemia 06/01/2014  . Syncope 12/03/2010  . WOLFF-PARKINSON-WHITE (WPW) SYNDROME status post ablation 08/20/2009  . Constipation 08/19/2009     Current Outpatient Medications on File Prior to Visit  Medication Sig Dispense Refill  .  Ascorbic Acid (VITAMIN C) 1000 MG tablet Take 1,000 mg by mouth daily.    . Calcium-Magnesium-Vitamin D (CALCIUM MAGNESIUM PO) Take 2 tablets by mouth daily.    . Cholecalciferol (D-5000) 5000 UNITS TABS Take 5,000 Units by mouth daily.    . Coenzyme Q10 (COQ10 PO) Take 1 tablet by mouth daily.    . Cyanocobalamin (RA VITAMIN B-12 TR) 1000 MCG TBCR Take 1,000 mcg by mouth daily.    Marland Kitchen D-Ribose (RIBOSE, D,) POWD Take 200 g by mouth daily.    . Multiple Vitamin (MULTIVITAMIN WITH MINERALS) TABS tablet Take 1 tablet by mouth daily.    . Omega-3 1000 MG CAPS Take 1 capsule by mouth daily.    . Probiotic Product (PROBIOTIC PO) Take 1 tablet by mouth daily.    . sodium chloride 1 G tablet Take 1 g by mouth 2 (two) times daily.    . cyclobenzaprine (FLEXERIL) 5 MG tablet Take 1 tablet (5 mg total) by mouth at bedtime. (Patient not taking: Reported on 01/05/2017) 10 tablet 0  . ferrous gluconate (FERGON) 324 MG tablet Take 1 tablet (324 mg total) by mouth daily with breakfast. (Patient not taking: Reported on 01/05/2017) 90 tablet 3  . glycerin adult 2 g suppository Place 1 suppository rectally as needed for constipation. (Patient not taking: Reported on 01/05/2017) 12 suppository 0   No current facility-administered medications on file prior to visit.     Allergies  Allergen Reactions  .  Amitiza [Lubiprostone] Anaphylaxis, Shortness Of Breath and Swelling  . Other Palpitations  . Ketorolac Palpitations  . Metoclopramide Other (See Comments)    Heart raced  . Midodrine Other (See Comments)    migraines  . Fludrocortisone Rash    Social History   Socioeconomic History  . Marital status: Single    Spouse name: Not on file  . Number of children: 0  . Years of education: 52  . Highest education level: Not on file  Social Needs  . Financial resource strain: Not on file  . Food insecurity - worry: Not on file  . Food insecurity - inability: Not on file  . Transportation needs - medical: Not on  file  . Transportation needs - non-medical: Not on file  Occupational History  . Occupation: Physicist, medical  Tobacco Use  . Smoking status: Never Smoker  . Smokeless tobacco: Never Used  Substance and Sexual Activity  . Alcohol use: No    Alcohol/week: 0.0 oz  . Drug use: No  . Sexual activity: No  Other Topics Concern  . Not on file  Social History Narrative   Lives at home with mother   Caffeine use: none    Family History  Problem Relation Age of Onset  . Diabetes Mother   . Hypertension Mother   . Stroke Mother   . Hypertension Brother   . Hyperlipidemia Maternal Grandmother   . Seizures Neg Hx     Past Surgical History:  Procedure Laterality Date  . CARDIAC ELECTROPHYSIOLOGY STUDY AND ABLATION  2011   Dr. Gevena Cotton at [facility]- left posterior pathway ablation for WPW  . LOOP RECORDER IMPLANT  01/22/2014   MDT LINQ implanted by Dr Alfonso Ellis at [facility] for evaluation of palpitations/syncope  . TILT TABLE STUDY  2011   neurally mediated syncope    ROS: Review of Systems Negative except as stated above. PHYSICAL EXAM: BP 102/68   Pulse 88   Temp 98.9 F (37.2 C) (Oral)   Resp 16   Wt 156 lb 9.6 oz (71 kg)   SpO2 97%   BMI 27.74 kg/m   Physical Exam  General appearance - alert, well appearing, and in no distress Mental status - alert, oriented to person, place, and time, normal mood, behavior, speech, dress, motor activity, and thought processes Chest - clear to auscultation, no wheezes, rales or rhonchi, symmetric air entry Heart - normal rate, regular rhythm, normal S1, S2, no murmurs, rubs, clicks or gallops Rectal - normal external rectal area.  No hemorrhoids or fissures seen.     ASSESSMENT AND PLAN: 1. Slow transit constipation We discussed giving her some GoLYTELY to keep and use as needed but patient declined stating that she has tried quite a number of stool softeners and laxatives without much success. -She is agreeable to drinking some prune  juice or eating prunes several times a week. Will refer to GI - Ambulatory referral to Gastroenterology  2. Dysmenorrhea - ibuprofen (ADVIL,MOTRIN) 600 MG tablet; Take 1 tablet (600 mg total) by mouth every 8 (eight) hours as needed. On day before and first few days of period  Dispense: 60 tablet; Refill: 2  3. Menorrhagia with regular cycle - CBC -We discussed other methods of birth control including IUD or Implanon that would be helpful in decreasing menstrual flow.  Patient states she would think about it. 4. Iron deficiency anemia, unspecified iron deficiency anemia type - CBC  Patient was given the opportunity to ask questions.  Patient verbalized  understanding of the plan and was able to repeat key elements of the plan.   Orders Placed This Encounter  Procedures  . CBC  . Ambulatory referral to Gastroenterology     Requested Prescriptions   Signed Prescriptions Disp Refills  . ibuprofen (ADVIL,MOTRIN) 600 MG tablet 60 tablet 2    Sig: Take 1 tablet (600 mg total) by mouth every 8 (eight) hours as needed. On day before and first few days of period    Return in about 4 months (around 08/11/2017).  Jonah Blueeborah Johnson, MD, FACP

## 2017-04-13 NOTE — Patient Instructions (Signed)
Take Ibuprofen as needed for menstrual cramps. Keep a calendar of menses.  Purchase and drink prune juice or eat prunes several times a week to see if it would help decrease constipation.

## 2017-04-14 LAB — CBC
Hematocrit: 36.1 % (ref 34.0–46.6)
Hemoglobin: 11.6 g/dL (ref 11.1–15.9)
MCH: 28 pg (ref 26.6–33.0)
MCHC: 32.1 g/dL (ref 31.5–35.7)
MCV: 87 fL (ref 79–97)
PLATELETS: 272 10*3/uL (ref 150–379)
RBC: 4.15 x10E6/uL (ref 3.77–5.28)
RDW: 13.3 % (ref 12.3–15.4)
WBC: 8.4 10*3/uL (ref 3.4–10.8)

## 2017-05-08 ENCOUNTER — Encounter: Payer: Self-pay | Admitting: Internal Medicine

## 2017-05-08 ENCOUNTER — Ambulatory Visit: Payer: Medicaid Other | Attending: Internal Medicine | Admitting: Internal Medicine

## 2017-05-08 ENCOUNTER — Other Ambulatory Visit: Payer: Self-pay

## 2017-05-08 ENCOUNTER — Other Ambulatory Visit (HOSPITAL_COMMUNITY)
Admission: RE | Admit: 2017-05-08 | Discharge: 2017-05-08 | Disposition: A | Discharge status: Home / Self Care | Payer: Medicaid Other | Source: Ambulatory Visit | Attending: Internal Medicine | Admitting: Internal Medicine

## 2017-05-08 VITALS — BP 111/74 | HR 88 | Temp 98.9°F | Resp 16 | Wt 154.4 lb

## 2017-05-08 DIAGNOSIS — Z9889 Other specified postprocedural states: Secondary | ICD-10-CM | POA: Insufficient documentation

## 2017-05-08 DIAGNOSIS — N898 Other specified noninflammatory disorders of vagina: Secondary | ICD-10-CM | POA: Insufficient documentation

## 2017-05-08 DIAGNOSIS — I456 Pre-excitation syndrome: Secondary | ICD-10-CM | POA: Insufficient documentation

## 2017-05-08 DIAGNOSIS — A5901 Trichomonal vulvovaginitis: Secondary | ICD-10-CM | POA: Insufficient documentation

## 2017-05-08 DIAGNOSIS — R000 Tachycardia, unspecified: Secondary | ICD-10-CM | POA: Diagnosis not present

## 2017-05-08 DIAGNOSIS — N760 Acute vaginitis: Secondary | ICD-10-CM

## 2017-05-08 DIAGNOSIS — B9689 Other specified bacterial agents as the cause of diseases classified elsewhere: Secondary | ICD-10-CM | POA: Diagnosis not present

## 2017-05-08 DIAGNOSIS — I951 Orthostatic hypotension: Secondary | ICD-10-CM

## 2017-05-08 DIAGNOSIS — R079 Chest pain, unspecified: Secondary | ICD-10-CM | POA: Diagnosis present

## 2017-05-08 DIAGNOSIS — L298 Other pruritus: Secondary | ICD-10-CM | POA: Insufficient documentation

## 2017-05-08 DIAGNOSIS — R0602 Shortness of breath: Secondary | ICD-10-CM | POA: Diagnosis not present

## 2017-05-08 DIAGNOSIS — R300 Dysuria: Secondary | ICD-10-CM | POA: Diagnosis present

## 2017-05-08 DIAGNOSIS — G90A Postural orthostatic tachycardia syndrome (POTS): Secondary | ICD-10-CM

## 2017-05-08 DIAGNOSIS — Z79899 Other long term (current) drug therapy: Secondary | ICD-10-CM | POA: Diagnosis not present

## 2017-05-08 LAB — POCT URINALYSIS DIPSTICK
Bilirubin, UA: NEGATIVE
Glucose, UA: NEGATIVE
Ketones, UA: NEGATIVE
NITRITE UA: NEGATIVE
PH UA: 7 (ref 5.0–8.0)
Protein, UA: NEGATIVE
RBC UA: NEGATIVE
Spec Grav, UA: 1.01 (ref 1.010–1.025)
UROBILINOGEN UA: 0.2 U/dL

## 2017-05-08 MED ORDER — FLUCONAZOLE 150 MG PO TABS
150.0000 mg | ORAL_TABLET | Freq: Once | ORAL | 0 refills | Status: AC
Start: 2017-05-08 — End: 2017-05-08

## 2017-05-08 MED ORDER — SULFAMETHOXAZOLE-TRIMETHOPRIM 800-160 MG PO TABS: 1.0000 | ORAL_TABLET | Freq: Two times a day (BID) | ORAL | 0 refills | Status: AC

## 2017-05-08 NOTE — Patient Instructions (Addendum)
Please follow-up with cardiology regarding left chest pain.  Urinary Tract Infection, Adult A urinary tract infection (UTI) is an infection of any part of the urinary tract. The urinary tract includes the:  Kidneys.  Ureters.  Bladder.  Urethra.  These organs make, store, and get rid of pee (urine) in the body. Follow these instructions at home:  Take over-the-counter and prescription medicines only as told by your doctor.  If you were prescribed an antibiotic medicine, take it as told by your doctor. Do not stop taking the antibiotic even if you start to feel better.  Avoid the following drinks: ? Alcohol. ? Caffeine. ? Tea. ? Carbonated drinks.  Drink enough fluid to keep your pee clear or pale yellow.  Keep all follow-up visits as told by your doctor. This is important.  Make sure to: ? Empty your bladder often and completely. Do not to hold pee for long periods of time. ? Empty your bladder before and after sex. ? Wipe from front to back after a bowel movement if you are female. Use each tissue one time when you wipe. Contact a doctor if:  You have back pain.  You have a fever.  You feel sick to your stomach (nauseous).  You throw up (vomit).  Your symptoms do not get better after 3 days.  Your symptoms go away and then come back. Get help right away if:  You have very bad back pain.  You have very bad lower belly (abdominal) pain.  You are throwing up and cannot keep down any medicines or water. This information is not intended to replace advice given to you by your health care provider. Make sure you discuss any questions you have with your health care provider. Document Released: 09/07/2007 Document Revised: 08/27/2015 Document Reviewed: 02/09/2015 Elsevier Interactive Patient Education  2018 [employer]  Vaginal Yeast infection, Adult Vaginal yeast infection is a condition that causes soreness, swelling, and redness (inflammation) of the vagina. It  also causes vaginal discharge. This is a common condition. Some women get this infection frequently. What are the causes? This condition is caused by a change in the normal balance of the yeast (candida) and bacteria that live in the vagina. This change causes an overgrowth of yeast, which causes the inflammation. What increases the risk? This condition is more likely to develop in:  Women who take antibiotic medicines.  Women who have diabetes.  Women who take birth control pills.  Women who are pregnant.  Women who douche often.  Women who have a weak defense (immune) system.  Women who have been taking steroid medicines for a long time.  Women who frequently wear tight clothing.  What are the signs or symptoms? Symptoms of this condition include:  White, thick vaginal discharge.  Swelling, itching, redness, and irritation of the vagina. The lips of the vagina (vulva) may be affected as well.  Pain or a burning feeling while urinating.  Pain during sex.  How is this diagnosed? This condition is diagnosed with a medical history and physical exam. This will include a pelvic exam. Your health care provider will examine a sample of your vaginal discharge under a microscope. Your health care provider may send this sample for testing to confirm the diagnosis. How is this treated? This condition is treated with medicine. Medicines may be over-the-counter or prescription. You may be told to use one or more of the following:  Medicine that is taken orally.  Medicine that is applied as a  cream.  Medicine that is inserted directly into the vagina (suppository).  Follow these instructions at home:  Take or apply over-the-counter and prescription medicines only as told by your health care provider.  Do not have sex until your health care provider has approved. Tell your sex partner that you have a yeast infection. That person should go to his or her health care provider if he or  she develops symptoms.  Do not wear tight clothes, such as pantyhose or tight pants.  Avoid using tampons until your health care provider approves.  Eat more yogurt. This may help to keep your yeast infection from returning.  Try taking a sitz bath to help with discomfort. This is a warm water bath that is taken while you are sitting down. The water should only come up to your hips and should cover your buttocks. Do this 3-4 times per day or as told by your health care provider.  Do not douche.  Wear breathable, cotton underwear.  If you have diabetes, keep your blood sugar levels under control. Contact a health care provider if:  You have a fever.  Your symptoms go away and then return.  Your symptoms do not get better with treatment.  Your symptoms get worse.  You have new symptoms.  You develop blisters in or around your vagina.  You have blood coming from your vagina and it is not your menstrual period.  You develop pain in your abdomen. This information is not intended to replace advice given to you by your health care provider. Make sure you discuss any questions you have with your health care provider. Document Released: 12/29/2004 Document Revised: 09/02/2015 Document Reviewed: 09/22/2014 Elsevier Interactive Patient Education  2018 [employer].

## 2017-05-08 NOTE — Progress Notes (Signed)
Pt states she is having some cardiac pain and vaginal pain  Pt states the pain in her chest is coming from the left side of the chest and down the arm  Pt states the pain in her vagina is like a sharp. Pt states when she urinates she has a burning sensation. Pt states she feels irritation   Pt states she has noticed discharge

## 2017-05-08 NOTE — Progress Notes (Signed)
Subjective:  Patient ID: Desiree Gutierrez, female    DOB: 04/25/1990  Age: 27 y.o. MRN: [id number]  CC: Urinary Tract Infection (possible); Vaginal Discharge; and Vaginal Itching  HPI Desiree Gutierrez is a 27 year old female that presents today for c/o of dysuria, vaginal itching, and chest pain.   1. Chest pain: (Hx of POTS, syncope, and WPW syndrome) Has been present since December 2018. She states that she mentioned it to her cardiologist during her appointment 12/18. Pain is in the left chest, sharp and radiates to left arm. Pain is consistent, comes and goes every few minutes, and can occur when she is resting or seating. It is not made worse with activity. C/O of mild SOB at times. Is awaken from sleep due to pain. Denies syncopal episodes. Pain had improved after her cardiologist visit but now is starting to worsen.   2. Dysuria/ Vaginal itching: Started 5 days ago, during her menstrual cycle. After her cycle stopped, she continued to have the symptoms. Reports dysuria, vaginal itching, and (brown) vaginal discharge. Denies foul/fishy odor. Admits to lower abdominal and lower back pain. She reports being sexual active with one partner for a year. Uses condoms consistently every time. Partner is in the [employer] and she doesn't see him often.   3. Diarrhea/ Nausea/ Vomiting: Occurred 6 days ago, only lasted 24 hours. Has not had these symptoms since.   Past Medical History:  Diagnosis Date  . Headache   . Palpitations    a. s/p MDT ILR implanted by Dr Alfonso EllisBeaty [facility]  . POTS (postural orthostatic tachycardia syndrome)   . Vasovagal syncope    a. positive tilt 2011 b. failed medical therapy with Florinef, Midodrine, Inderal, Celexa  . WPW (Wolff-Parkinson-White syndrome)    a. s/p ablation (2011) [facility] (post[facility])   Past Surgical History:  Procedure Laterality Date  . CARDIAC ELECTROPHYSIOLOGY STUDY AND ABLATION  2011   [facility]  post[facility] pathway ablation for WPW  . LOOP RECORDER IMPLANT  01/22/2014   MD[facility] LIN[facility] for [facility] of palpitations/syncope  . TILT TABLE STUDY  2011   neurally mediated syncope   Allergies  Allergen Reactions  . Amitiza [Lubiprostone] Anaphylaxis, Shortness Of Breath and Swelling  . Other Palpitations  . Ketorolac Palpitations  . Metoclopramide Other (See Comments)    Heart raced  . Midodrine Other (See Comments)    migraines  . Fludrocortisone Rash    Outpatient Medications Prior to Visit  Medication Sig Dispense Refill  . Ascorbic Acid (VITAMIN C) 1000 MG tablet Take 1,000 mg by mouth daily.    . Calcium-Magnesium-Vitamin D (CALCIUM MAGNESIUM PO) Take 2 tablets by mouth daily.    . Cholecalciferol (D-5000) 5000 UNITS TABS Take 5,000 Units by mouth daily.    . Coenzyme Q10 (COQ10 PO) Take 1 tablet by mouth daily.    . Cyanocobalamin (RA VITAMIN B-12 TR) 1000 MCG TBCR Take 1,000 mcg by mouth daily.    . cyclobenzaprine (FLEXERIL) 5 MG tablet Take 1 tablet (5 mg total) by mouth at bedtime. (Patient not taking: Reported on 01/05/2017) 10 tablet 0  . D-Ribose (RIBOSE, D,) POWD Take 200 g by mouth daily.    . ferrous gluconate (FERGON) 324 MG tablet Take 1 tablet (324 mg total) by mouth daily with breakfast. (Patient not taking: Reported on 01/05/2017) 90 tablet 3  . glycerin adult 2 g suppository Place 1 suppository rectally as needed for  constipation. (Patient not taking: Reported on 01/05/2017) 12 suppository 0  . ibuprofen (ADVIL,MOTRIN) 600 MG tablet Take 1 tablet (600 mg total) by mouth every 8 (eight) hours as needed. On day before and first few days of period 60 tablet 2  . Multiple Vitamin (MULTIVITAMIN WITH MINERALS) TABS tablet Take 1 tablet by mouth daily.    . Omega-3 1000 MG CAPS Take 1 capsule by mouth daily.    . Probiotic Product (PROBIOTIC PO) Take 1 tablet by mouth daily.    . sodium chloride 1 G tablet Take 1 g by mouth 2 (two) times  daily.     No facility-administered medications prior to visit.     ROS Review of Systems  Constitutional: Positive for chills.  HENT: Negative.   Respiratory: Positive for shortness of breath. Negative for cough.        Mild SOB during episodes of chest pains  Cardiovascular: Positive for chest pain. Negative for palpitations.  Gastrointestinal: Negative for abdominal pain.  Genitourinary: Positive for dysuria, flank pain, pelvic pain and vaginal discharge.    Objective:  BP 111/74   Pulse 88   Temp 98.9 F (37.2 C) (Oral)   Resp 16   Wt 154 lb 6.4 oz (70 kg)   SpO2 97%   BMI 27.35 kg/m   BP/Weight 05/08/2017 04/13/2017 01/05/2017  Systolic BP 111 102 106  Diastolic BP 74 68 73  Wt. (Lbs) 154.4 156.6 153.2  BMI 27.35 27.74 27.14   EKG:  05/08/17: Normal Sinus rhythm with sinus arrhythmia, no ST elevation noted.  Physical Exam  Constitutional: She is oriented to person, place, and time. She appears well-developed and well-nourished. No distress.  Neck: Normal range of motion. Neck supple.  Cardiovascular: Normal rate, regular rhythm and normal heart sounds.  occasional premature beat   Pulmonary/Chest: Effort normal and breath sounds normal. She exhibits no tenderness.  Abdominal: Soft. Bowel sounds are normal.  Mid-lower abdominal pain  Genitourinary: There is tenderness in the vagina. No erythema in the vagina. Vaginal discharge (moderate thick yellow-white discharge, fishy odor present) found.  Genitourinary Comments: Positive for left CVA tenderness  Neurological: She is alert and oriented to person, place, and time.  Skin: Skin is warm and dry.  Vitals reviewed.  Results for orders placed or performed in visit on 05/08/17  POCT urinalysis dipstick  Result Value Ref Range   Color, UA yellow    Clarity, UA clear    Glucose, UA negative    Bilirubin, UA negative    Ketones, UA negative    Spec Grav, UA 1.010 1.010 - 1.025   Blood, UA negative    pH, UA 7.0 5.0 -  8.0   Protein, UA negative    Urobilinogen, UA 0.2 0.2 or 1.0 E.U./dL   Nitrite, UA negative    Leukocytes, UA Moderate (2+) (A) Negative   Appearance     Odor      Assessment & Plan:   1. Dysuria Likely related to UTI. Will treat with Bactrim and await cervicovaginal ancillary results to rule out STI.  - POCT urinalysis dipstick - HIV antibody (with reflex) - RPR - sulfamethoxazole-trimethoprim (BACTRIM DS,SEPTRA DS) 800-160 MG tablet; Take 1 tablet by mouth 2 (two) times daily for 3 days.  Dispense: 6 tablet; Refill: 0  2. Acute vaginitis Likely related to vaginal yeast infection. Due to odor of the vaginal discharge, cervicovaginal ancillary obtained. One time dose of diflucan ordered.  - fluconazole (DIFLUCAN) 150 MG tablet; Take 1  tablet (150 mg total) by mouth once for 1 dose.  Dispense: 1 tablet; Refill: 0  3. Left sided chest pain EKG showed Normal Sinus Rhythm with Sinus arrthymia. Recommended she make an appointment with her cardiologists.  4. POTS (postural orthostatic tachycardia syndrome) Follow-up with cardiology.   Meds ordered this encounter  Medications  . fluconazole (DIFLUCAN) 150 MG tablet    Sig: Take 1 tablet (150 mg total) by mouth once for 1 dose.    Dispense:  1 tablet    Refill:  0  . sulfamethoxazole-trimethoprim (BACTRIM DS,SEPTRA DS) 800-160 MG tablet    Sig: Take 1 tablet by mouth 2 (two) times daily for 3 days.    Dispense:  6 tablet    Refill:  0    Follow-up: Return if symptoms worsen or fail to improve.   Hassan Buckler DNP student

## 2017-05-09 NOTE — Progress Notes (Signed)
This pt was seen by NP student under my supervision.  I have confirmed the essential elements of the hx, P.E and participated in clinical decision making.

## 2017-05-10 LAB — CERVICOVAGINAL ANCILLARY ONLY
Bacterial vaginitis: NEGATIVE
CANDIDA VAGINITIS: NEGATIVE
CHLAMYDIA, DNA PROBE: NEGATIVE
NEISSERIA GONORRHEA: NEGATIVE
Trichomonas: POSITIVE — AB

## 2017-05-10 LAB — RPR, QUANT+TP ABS (REFLEX)
Rapid Plasma Reagin, Quant: 1:1 {titer} — ABNORMAL HIGH
T Pallidum Abs: NEGATIVE

## 2017-05-10 LAB — HIV ANTIBODY (ROUTINE TESTING W REFLEX): HIV SCREEN 4TH GENERATION: NONREACTIVE

## 2017-05-10 LAB — RPR: RPR Ser Ql: REACTIVE — AB

## 2017-05-12 ENCOUNTER — Other Ambulatory Visit: Payer: Self-pay | Admitting: Internal Medicine

## 2017-05-12 MED ORDER — METRONIDAZOLE 500 MG PO TABS: 500.0000 mg | ORAL_TABLET | Freq: Two times a day (BID) | ORAL | 0 refills | Status: DC

## 2017-05-12 MED FILL — metroNIDAZOLE 500 MG TABS: 500 | 7 days supply | Qty: 14 | Fill #0

## 2017-05-31 ENCOUNTER — Emergency Department (HOSPITAL_COMMUNITY): Payer: Medicaid Other

## 2017-05-31 ENCOUNTER — Encounter (HOSPITAL_COMMUNITY): Payer: Self-pay | Admitting: Emergency Medicine

## 2017-05-31 ENCOUNTER — Emergency Department (HOSPITAL_COMMUNITY)
Admission: EM | Admit: 2017-05-31 | Discharge: 2017-05-31 | Disposition: A | Discharge status: Home / Self Care | Payer: Medicaid Other | Attending: Emergency Medicine | Admitting: Emergency Medicine

## 2017-05-31 DIAGNOSIS — D649 Anemia, unspecified: Secondary | ICD-10-CM | POA: Diagnosis not present

## 2017-05-31 DIAGNOSIS — R42 Dizziness and giddiness: Secondary | ICD-10-CM

## 2017-05-31 DIAGNOSIS — R072 Precordial pain: Secondary | ICD-10-CM | POA: Diagnosis not present

## 2017-05-31 DIAGNOSIS — I5030 Unspecified diastolic (congestive) heart failure: Secondary | ICD-10-CM | POA: Diagnosis not present

## 2017-05-31 DIAGNOSIS — Z79899 Other long term (current) drug therapy: Secondary | ICD-10-CM | POA: Insufficient documentation

## 2017-05-31 DIAGNOSIS — R1013 Epigastric pain: Secondary | ICD-10-CM | POA: Insufficient documentation

## 2017-05-31 DIAGNOSIS — R0602 Shortness of breath: Secondary | ICD-10-CM | POA: Diagnosis not present

## 2017-05-31 LAB — HEPATIC FUNCTION PANEL
ALT: 13 U/L — AB (ref 14–54)
AST: 22 U/L (ref 15–41)
Albumin: 4.1 g/dL (ref 3.5–5.0)
Alkaline Phosphatase: 52 U/L (ref 38–126)
BILIRUBIN TOTAL: 0.5 mg/dL (ref 0.3–1.2)
Total Protein: 7 g/dL (ref 6.5–8.1)

## 2017-05-31 LAB — CBC
HCT: 35.5 % — ABNORMAL LOW (ref 36.0–46.0)
Hemoglobin: 11.2 g/dL — ABNORMAL LOW (ref 12.0–15.0)
MCH: 28 pg (ref 26.0–34.0)
MCHC: 31.5 g/dL (ref 30.0–36.0)
MCV: 88.8 fL (ref 78.0–100.0)
PLATELETS: 237 10*3/uL (ref 150–400)
RBC: 4 MIL/uL (ref 3.87–5.11)
RDW: 12.1 % (ref 11.5–15.5)
WBC: 8.5 10*3/uL (ref 4.0–10.5)

## 2017-05-31 LAB — BRAIN NATRIURETIC PEPTIDE: B Natriuretic Peptide: 6.3 pg/mL (ref 0.0–100.0)

## 2017-05-31 LAB — LIPASE, BLOOD: LIPASE: 32 U/L (ref 11–51)

## 2017-05-31 LAB — BASIC METABOLIC PANEL
Anion gap: 10 (ref 5–15)
BUN: 13 mg/dL (ref 6–20)
CHLORIDE: 104 mmol/L (ref 101–111)
CO2: 24 mmol/L (ref 22–32)
Calcium: 9.2 mg/dL (ref 8.9–10.3)
Creatinine, Ser: 1.08 mg/dL — ABNORMAL HIGH (ref 0.44–1.00)
Glucose, Bld: 87 mg/dL (ref 65–99)
Potassium: 3.9 mmol/L (ref 3.5–5.1)
SODIUM: 138 mmol/L (ref 135–145)

## 2017-05-31 LAB — D-DIMER, QUANTITATIVE (NOT AT ARMC): D DIMER QUANT: 0.67 ug{FEU}/mL — AB (ref 0.00–0.50)

## 2017-05-31 LAB — I-STAT BETA HCG BLOOD, ED (MC, WL, AP ONLY): I-stat hCG, quantitative: <5 m[IU]/mL (ref <5)

## 2017-05-31 LAB — I-STAT TROPONIN, ED
TROPONIN I, POC: 0 ng/mL (ref 0.00–0.08)
Troponin i, poc: 0 ng/mL (ref 0.00–0.08)

## 2017-05-31 MED ORDER — IOPAMIDOL (ISOVUE-370) INJECTION 76%
INTRAVENOUS | Status: AC
  Administered 2017-05-31: 100 mL
  Filled 2017-05-31: qty 100

## 2017-05-31 MED ORDER — FAMOTIDINE IN NACL 20-0.9 MG/50ML-% IV SOLN
20.0000 mg | Freq: Once | INTRAVENOUS | Status: AC
  Administered 2017-05-31: 20 mg via INTRAVENOUS
  Filled 2017-05-31: qty 50

## 2017-05-31 MED ORDER — SODIUM CHLORIDE 0.9 % IV BOLUS (SEPSIS)
1000.0000 mL | Freq: Once | INTRAVENOUS | Status: AC
  Administered 2017-05-31: 1000 mL via INTRAVENOUS

## 2017-05-31 MED ORDER — GI COCKTAIL ~~LOC~~
30.0000 mL | Freq: Once | ORAL | Status: AC
  Administered 2017-05-31: 30 mL via ORAL
  Filled 2017-05-31: qty 30

## 2017-05-31 NOTE — ED Notes (Signed)
Lab called to verify labs need. Reported they had all tubes needed.

## 2017-05-31 NOTE — Discharge Instructions (Signed)
Your work up in the ED today did not reveal a concerning cause of your symptoms.  We advise close follow-up with your primary care doctor.  Continue your daily medications as prescribed.

## 2017-05-31 NOTE — ED Notes (Signed)
Labs reviewed.  No change in acuity at this time

## 2017-05-31 NOTE — ED Provider Notes (Signed)
Patient placed in Quick Look pathway, seen and evaluated   Chief Complaint: chest pressure.  HPI:  Patient reports waking this am with chest pressure and pain with deep breaths and difficulty breathing.   ROS: cardiac: chest pain  Physical Exam:  BP 103/74 (BP Location: Right Arm)   Pulse 89   Temp 98.9 F (37.2 C) (Oral)   Resp 17   Ht 5\' 3"  (1.6 m)   Wt 69.4 kg (153 lb)   LMP 05/04/2017 (Exact Date)   SpO2 100%   BMI 27.10 kg/m    Gen: No distress  Neuro: Awake and Alert  Skin: Warm and dry  Lungs: Clear     Focused Exam:    Initiation of care has begun. The patient has been counseled on the process, plan, and necessity for staying for the completion/evaluation, and the remainder of the medical screening examination    Janne Napoleoneese, Marton Malizia M, NP 05/31/17 1510    Rolland PorterJames, Mark, MD 06/01/17 2203

## 2017-05-31 NOTE — ED Triage Notes (Signed)
Per PT: " I woke up this morning at 0730 with intense pressure on my chest". PT states she had pain w/ respirations and difficulty breathing. Lungs sound clear, A&Ox4, ambulatory.

## 2017-05-31 NOTE — ED Provider Notes (Signed)
[facility] EMERGENCY DEPARTMENT Provider Note   CSN: [id number] Arrival date & time: 05/31/17  1432     History   Chief Complaint Chief Complaint  Patient presents with  . Shortness of Breath    HPI Desiree Gutierrez is a 27 y.o. female with a PMHx of WPW s/p ablation, palpitations s/p MDT ILR implantation, POTS, vasovagal syncope, headaches, vertigo, and anemia, who presents to the ED with complaints of chest pain and shortness of breath that woke her up from sleep at 7:30 AM today.  She describes the pain as 10/10 at onset but 8/10 currently, constant pressure-like pain in the center of her chest, radiating into the upper back, worse with inspiration, and with no treatments tried prior to arrival.  She reports associated shortness of breath, lightheadedness, diaphoresis, epigastric/LUQ pain, tingling in her LUE, and 1 month of gradually worsening orthopnea (4 pillows now, up from 2 pillows previously).  Her cardiologist is at [facility]. Her PCP is Dr. Laural Benes at [facility].  She is a nonsmoker with no family hx of cardiac disease that she's aware of.   She denies fevers, chills, cough, LE swelling, recent travel/surgery/immobilization, estrogen use, personal/family hx of DVT/PE, N/V/D/C, hematuria, dysuria, myalgias, arthralgias, claudication, numbness, focal weakness, or any other complaints at this time.   The history is provided by the patient and medical records. No language interpreter was used.  Chest Pain   This is a new problem. The current episode started 6 to 12 hours ago. The problem occurs constantly. The problem has been gradually improving. The pain is associated with rest. The pain is present in the substernal region. The pain is at a severity of 10/10 (8/10 now). The pain is moderate. The quality of the pain is described as pressure-like. The pain radiates to the upper back. Duration of episode(s) is 12 hours. The symptoms are aggravated by deep breathing. Associated  symptoms include abdominal pain, diaphoresis, orthopnea and shortness of breath. Pertinent negatives include no claudication, no cough, no fever, no lower extremity edema, no nausea, no numbness, no vomiting and no weakness. She has tried nothing for the symptoms. The treatment provided no relief.  Pertinent negatives for past medical history include no CHF, no diabetes, no DVT, no hyperlipidemia, no hypertension and no PE.  Pertinent negatives for family medical history include: no CAD, no early MI and no PE.  Procedure history is positive for echocardiogram.    Past Medical History:  Diagnosis Date  . Headache   . Palpitations    a. s/p MDT ILR implanted by Dr Alfonso Ellis [facility])  . POTS (postural orthostatic tachycardia syndrome)   . Vasovagal syncope    a. positive tilt 2011 b. failed medical therapy with Florinef, Midodrine, Inderal, Celexa  . WPW (Wolff-Parkinson-White syndrome)    a. s/p ablation (2011) Dr Gevena Cotton at [facility] (posterior lateral pathway)    Patient Active Problem List   Diagnosis Date Noted  . Vertigo 10/27/2016  . Concussion with no loss of consciousness 09/19/2016  . Purpura (HCC) 10/15/2015  . Diastolic dysfunction 09/08/2015  . Vocal cord dysfunction 06/02/2015  . Pain of molar 02/13/2015  . Observed seizure-like activity (HCC) 02/02/2015  . Hypoglycemia   . POTS (postural orthostatic tachycardia syndrome) 06/01/2014  . Iron deficiency anemia 06/01/2014  . Syncope 12/03/2010  . WOLFF-PARKINSON-WHITE (WPW) SYNDROME status post ablation 08/20/2009  . Constipation 08/19/2009    Past Surgical History:  Procedure Laterality Date  . CARDIAC ELECTROPHYSIOLOGY STUDY AND ABLATION  2011  Dr. Gevena Cotton at [facility]- left posterior pathway ablation for WPW  . LOOP RECORDER IMPLANT  01/22/2014   MDT LINQ implanted by Dr Alfonso Ellis at [facility] for evaluation of palpitations/syncope  . TILT TABLE STUDY  2011   neurally mediated syncope    OB History    No data available        Home Medications    Prior to Admission medications   Medication Sig Start Date End Date Taking? Authorizing Provider  Ascorbic Acid (VITAMIN C) 1000 MG tablet Take 1,000 mg by mouth daily.    [provider]  Calcium-Magnesium-Vitamin D (CALCIUM MAGNESIUM PO) Take 2 tablets by mouth daily.    [provider]  Cholecalciferol (D-5000) 5000 UNITS TABS Take 5,000 Units by mouth daily.    [provider]  Coenzyme Q10 (COQ10 PO) Take 1 tablet by mouth daily.    [provider]  Cyanocobalamin (RA VITAMIN B-12 TR) 1000 MCG TBCR Take 1,000 mcg by mouth daily.    [provider]  cyclobenzaprine (FLEXERIL) 5 MG tablet Take 1 tablet (5 mg total) by mouth at bedtime. Patient not taking: Reported on 01/05/2017 09/19/16   Funches, Gerilyn Nestle, MD  D-Ribose (RIBOSE, D,) POWD Take 200 g by mouth daily.    [provider]  ferrous gluconate (FERGON) 324 MG tablet Take 1 tablet (324 mg total) by mouth daily with breakfast. Patient not taking: Reported on 01/05/2017 07/18/16   [name], MD  glycerin adult 2 g suppository Place 1 suppository rectally as needed for constipation. Patient not taking: Reported on 01/05/2017 09/19/16   [name], MD  ibuprofen (ADVIL,MOTRIN) 600 MG tablet Take 1 tablet (600 mg total) by mouth every 8 (eight) hours as needed. On day before and first few days of period 04/13/17   Marcine Matar, MD  metroNIDAZOLE (FLAGYL) 500 MG tablet Take 1 tablet (500 mg total) by mouth 2 (two) times daily. 05/12/17   Marcine Matar, MD  Multiple Vitamin (MULTIVITAMIN WITH MINERALS) TABS tablet Take 1 tablet by mouth daily.    [provider]  Omega-3 1000 MG CAPS Take 1 capsule by mouth daily.    [provider]  Probiotic Product (PROBIOTIC PO) Take 1 tablet by mouth daily.    [provider]  sodium chloride 1 G tablet Take 1 g by mouth 2 (two) times daily.    [provider]    Family  History Family History  Problem Relation Age of Onset  . Diabetes Mother   . Hypertension Mother   . Stroke Mother   . Hypertension Brother   . Hyperlipidemia Maternal Grandmother   . Seizures Neg Hx     Social History Social History   Tobacco Use  . Smoking status: Never Smoker  . Smokeless tobacco: Never Used  Substance Use Topics  . Alcohol use: No    Alcohol/week: 0.0 oz  . Drug use: No     Allergies   Amitiza [lubiprostone]; Other; Ketorolac; Metoclopramide; Midodrine; and Fludrocortisone   Review of Systems Review of Systems  Constitutional: Positive for diaphoresis. Negative for chills and fever.  Respiratory: Positive for shortness of breath. Negative for cough.        +4 pillow orthopnea x1 month  Cardiovascular: Positive for chest pain and orthopnea. Negative for claudication and leg swelling.  Gastrointestinal: Positive for abdominal pain. Negative for constipation, diarrhea, nausea and vomiting.  Genitourinary: Negative for dysuria and hematuria.  Musculoskeletal: Negative for arthralgias and myalgias.  Skin: Negative  for color change.  Allergic/Immunologic: Negative for immunocompromised state.  Neurological: Positive for light-headedness. Negative for weakness and numbness.       +tingling LUE  Psychiatric/Behavioral: Negative for confusion.   All other systems reviewed and are negative for acute change except as noted in the HPI.    Physical Exam Updated Vital Signs BP (!) 104/59 (BP Location: Right Arm)   Pulse 87   Temp 98.9 F (37.2 C) (Oral)   Resp 12   Ht 5\' 3"  (1.6 m)   Wt 69.4 kg (153 lb)   LMP 05/04/2017 (Exact Date)   SpO2 100%   BMI 27.10 kg/m   Physical Exam  Constitutional: She is oriented to person, place, and time. Vital signs are normal. She appears well-developed and well-nourished.  Non-toxic appearance. No distress.  Afebrile, nontoxic, NAD  HENT:  Head: Normocephalic and atraumatic.  Mouth/Throat: Oropharynx is clear  and moist and mucous membranes are normal.  Eyes: Conjunctivae and EOM are normal. Right eye exhibits no discharge. Left eye exhibits no discharge.  Neck: Normal range of motion. Neck supple.  Cardiovascular: Normal rate, regular rhythm, normal heart sounds and intact distal pulses. Exam reveals no gallop and no friction rub.  No murmur heard. RRR, nl s1/s2, no m/r/g, distal pulses intact, no pedal edema   Pulmonary/Chest: Effort normal and breath sounds normal. No respiratory distress. She has no decreased breath sounds. She has no wheezes. She has no rhonchi. She has no rales. She exhibits no tenderness, no crepitus, no deformity and no retraction.  CTAB in all lung fields, no w/r/r, no hypoxia or increased WOB, speaking in full sentences, SpO2 100% on RA Chest wall nonTTP without crepitus, deformities, or retractions   Abdominal: Soft. Normal appearance and bowel sounds are normal. She exhibits no distension. There is tenderness in the epigastric area and left upper quadrant. There is no rigidity, no rebound, no guarding, no CVA tenderness, no tenderness at McBurney's point and negative Murphy's sign.  Soft, nondistended, +BS throughout, with mild epigastric and LUQ TTP, no r/g/r, neg murphy's, neg mcburney's, no CVA TTP   Musculoskeletal: Normal range of motion.  MAE x4 Strength and sensation grossly intact in all extremities Distal pulses intact No pedal edema, neg homan's bilaterally   Neurological: She is alert and oriented to person, place, and time. She has normal strength. No sensory deficit.  Skin: Skin is warm, dry and intact. No rash noted.  Psychiatric: She has a normal mood and affect.  Nursing note and vitals reviewed.    ED Treatments / Results  Labs (all labs ordered are listed, but only abnormal results are displayed) Labs Reviewed  BASIC METABOLIC PANEL - Abnormal; Notable for the following components:      Result Value   Creatinine, Ser 1.08 (*)    All other  components within normal limits  CBC - Abnormal; Notable for the following components:   Hemoglobin 11.2 (*)    HCT 35.5 (*)    All other components within normal limits  D-DIMER, QUANTITATIVE (NOT AT [facility]) - Abnormal; Notable for the following components:   D-Dimer, Quant 0.67 (*)    All other components within normal limits  HEPATIC FUNCTION PANEL - Abnormal; Notable for the following components:   ALT 13 (*)    Bilirubin, Direct <0.1 (*)    All other components within normal limits  LIPASE, BLOOD  BRAIN NATRIURETIC PEPTIDE  I-STAT TROPONIN, ED  I-STAT BETA HCG BLOOD, ED ([facility], [facility], [facility] ONLY)  I-STAT  TROPONIN, ED    EKG  EKG Interpretation  Date/Time:  Wednesday May 31 2017 14:52:07 EST Ventricular Rate:  86 PR Interval:  122 QRS Duration: 90 QT Interval:  352 QTC Calculation: 421 R Axis:   86 Text Interpretation:  Normal sinus rhythm Normal ECG No delta, Normal PR Confirmed by Rolland PorterJames, Mark ([id number]) on 05/31/2017 7:03:40 PM       Radiology Dg Chest 2 View  Result Date: 05/31/2017 CLINICAL DATA:  Chest pressure EXAM: CHEST  2 VIEW COMPARISON:  11/10/2015 FINDINGS: The heart size and mediastinal contours are within normal limits. Both lungs are clear. The visualized skeletal structures are unremarkable. IMPRESSION: No active cardiopulmonary disease. Electronically Signed   By: Jasmine PangKim  Fujinaga M.D.   On: 05/31/2017 15:18    Stress Test 06/22/15: Conclusion: Exercise testing with gas exchange demonstrates a mild functional impairment when compared to matched sedentary norms. There was mild chronotropic incompetence in addition to poor measured PVO2 and low, with early flattening O2/pulse. There are no clear ventilatory limtiation, however there is mild evidence of circulatory limitations which could be due to diastolic dysfunction in combination with arrhythmia and suspected POTS. In addition, patient is likely poorly conditioned due to persistent syncope.    Echo  06/26/14: Study Conclusions - Left ventricle: The cavity size was normal. Systolic function was normal. The estimated ejection fraction was in the range of 60% to 65%. Wall motion was normal; there were no regional wall motion abnormalities.   Procedures Procedures (including critical care time)  Medications Ordered in ED Medications  sodium chloride 0.9 % bolus 1,000 mL (1,000 mLs Intravenous New Bag/Given 05/31/17 2038)  gi cocktail (Maalox,Lidocaine,Donnatal) (30 mLs Oral Given 05/31/17 2038)  famotidine (PEPCID) IVPB 20 mg premix (0 mg Intravenous Stopped 05/31/17 2120)     Initial Impression / Assessment and Plan / ED Course  I have reviewed the triage vital signs and the nursing notes.  Pertinent labs & imaging results that were available during my care of the patient were reviewed by me and considered in my medical decision making (see chart for details).     27 y.o. female here with CP and SOB that began this morning while awakening from sleep, with associated lightheadedness, diaphoresis, epigastric/LUQ pain, tingling in LUE, and 1 month of worsening orthopnea. On exam, no chest wall TTP, mild epigastric and LUQ TTP, nonperitoneal, no RUQ TTP or murphy's sign, no flank tenderness, clear lungs, no tachycardia or hypoxia, no pedal edema and neg homan's sign bilaterally. Work up thus far reveals: trop neg at nearly 8hr mark; betaHCG neg; BMP with marginally elevated Cr 1.08 but otherwise unremarkable; CBC with stable anemia; CXR neg; EKG unremarkable and nonischemic. Will repeat troponin now since it's been >3hrs, get BNP, LFTs, lipase, and D-dimer given her pleuritic chest pain. Will give GI cocktail, fluids, and pepcid then reassess shortly.   9:04 PM Second trop neg. D-dimer marginally elevated at 0.67, will proceed with CTA to r/o PE. Remainder of labs pending. Will reassess shortly.   10:00 PM LFTs WNL. Lipase WNL. BNP WNL. CTA chest pending. Pt feeling slightly better  after fluids/meds. Patient care to be resumed by Antony MaduraKelly Humes, PA-C at shift change sign-out. Patient history has been discussed with midlevel resuming care. Please see their notes for further documentation of pending results and dispo/care. Pt stable at sign-out and updated on transfer of care.    Final Clinical Impressions(s) / ED Diagnoses   Final diagnoses:  Precordial chest pain  SOB (shortness  of breath)  Lightheadedness  Chronic anemia  Epigastric abdominal pain    ED Discharge Orders    [address], [place], [place] 05/31/17 2205    Rolland Porter, MD 06/01/17 2203

## 2017-05-31 NOTE — ED Notes (Signed)
Patient transported to CT 

## 2017-05-31 NOTE — ED Provider Notes (Signed)
10:46 PM Patient care assumed from [facility], PA-C at change of shift.  Patient pending CTA to rule out pulmonary embolus.  Imaging has been reviewed which shows no acute or concerning for cardiopulmonary abnormality.  No evidence of pulmonary embolus.  Remainder of workup reviewed which has been reassuring.  Troponin has been negative x2.  Mild anemia is consistent with baseline.  I do not believe this is contributing to the patient's symptoms today.  Results of been relayed to the patient who verbalizes understanding.  I have recommended that she follow-up with her primary care doctor regarding her visit today.  Return precautions discussed and provided. Patient discharged in stable condition with no unaddressed concerns.   Results for orders placed or performed during the hospital encounter of 05/31/17  Basic metabolic panel  Result Value Ref Range   Sodium 138 135 - 145 mmol/L   Potassium 3.9 3.5 - 5.1 mmol/L   Chloride 104 101 - 111 mmol/L   CO2 24 22 - 32 mmol/L   Glucose, Bld 87 65 - 99 mg/dL   BUN 13 6 - 20 mg/dL   Creatinine, Ser 9.141.08 (H) 0.44 - 1.00 mg/dL   Calcium 9.2 8.9 - 78.210.3 mg/dL   GFR calc non Af Amer >60 >60 mL/min   GFR calc Af Amer >60 >60 mL/min   Anion gap 10 5 - 15  CBC  Result Value Ref Range   WBC 8.5 4.0 - 10.5 K/uL   RBC 4.00 3.87 - 5.11 MIL/uL   Hemoglobin 11.2 (L) 12.0 - 15.0 g/dL   HCT 95.635.5 (L) 21.336.0 - 08.646.0 %   MCV 88.8 78.0 - 100.0 fL   MCH 28.0 26.0 - 34.0 pg   MCHC 31.5 30.0 - 36.0 g/dL   RDW 57.812.1 46.911.5 - 62.915.5 %   Platelets 237 150 - 400 K/uL  D-dimer, quantitative (not at [facility])  Result Value Ref Range   D-Dimer, Quant 0.67 (H) 0.00 - 0.50 ug/mL-FEU  Lipase, blood  Result Value Ref Range   Lipase 32 11 - 51 U/L  Hepatic function panel  Result Value Ref Range   Total Protein 7.0 6.5 - 8.1 g/dL   Albumin 4.1 3.5 - 5.0 g/dL   AST 22 15 - 41 U/L   ALT 13 (L) 14 - 54 U/L   Alkaline Phosphatase 52 38 - 126 U/L   Total Bilirubin 0.5 0.3 - 1.2  mg/dL   Bilirubin, Direct <5.2<0.1 (L) 0.1 - 0.5 mg/dL   Indirect Bilirubin NOT CALCULATED 0.3 - 0.9 mg/dL  Brain natriuretic peptide  Result Value Ref Range   B Natriuretic Peptide 6.3 0.0 - 100.0 pg/mL  I-stat troponin, ED  Result Value Ref Range   Troponin i, poc 0.00 0.00 - 0.08 ng/mL   Comment 3          I-Stat beta hCG blood, ED  Result Value Ref Range   I-stat hCG, quantitative <5.0 <5 mIU/mL   Comment 3          I-stat troponin, ED  Result Value Ref Range   Troponin i, poc 0.00 0.00 - 0.08 ng/mL   Comment 3           Dg Chest 2 View  Result Date: 05/31/2017 CLINICAL DATA:  Chest pressure EXAM: CHEST  2 VIEW COMPARISON:  11/10/2015 FINDINGS: The heart size and mediastinal contours are within normal limits. Both lungs are clear. The visualized skeletal structures are unremarkable. IMPRESSION: No active cardiopulmonary disease. Electronically Signed  By: Jasmine Pang M.D.   On: 05/31/2017 15:18   Ct Angio Chest Pe W And/or Wo Contrast  Result Date: 05/31/2017 CLINICAL DATA:  Chest pressure, shortness of Breath, elevated D-dimer. EXAM: CT ANGIOGRAPHY CHEST WITH CONTRAST TECHNIQUE: Multidetector CT imaging of the chest was performed using the standard protocol during bolus administration of intravenous contrast. Multiplanar CT image reconstructions and MIPs were obtained to evaluate the vascular anatomy. CONTRAST:  ISOVUE-370 IOPAMIDOL (ISOVUE-370) INJECTION 76% COMPARISON:  None. FINDINGS: Cardiovascular: Heart is normal size. Aorta is normal caliber. No filling defects in the pulmonary arteries to suggestpulmonary emboli. Mediastinum/Nodes: No mediastinal, hilar, or axillary adenopathy. Trachea and esophagus are unremarkable. Lungs/Pleura: Lungs are clear. No focal airspace opacities or suspicious nodules. No effusions. Upper Abdomen: Imaging into the upper abdomen shows no acute findings. Musculoskeletal: Chest wall soft tissues are unremarkable. No acute bony abnormality. Review  of the MIP images confirms the above findings. IMPRESSION: No evidence of pulmonary embolus. No acute cardiopulmonary disease. Electronically Signed   By: Charlett Nose M.D.   On: 05/31/2017 22:30      Antony Madura, PA-C 05/31/17 [id number]    Pricilla Loveless, MD 06/01/17 [phone]

## 2017-06-02 ENCOUNTER — Ambulatory Visit: Payer: Medicaid Other | Attending: Internal Medicine | Admitting: Internal Medicine

## 2017-06-02 ENCOUNTER — Telehealth (HOSPITAL_COMMUNITY): Payer: Self-pay | Admitting: Internal Medicine

## 2017-06-02 ENCOUNTER — Encounter: Payer: Self-pay | Admitting: Internal Medicine

## 2017-06-02 VITALS — BP 125/79 | HR 82 | Temp 99.1°F | Resp 16 | Wt 154.0 lb

## 2017-06-02 DIAGNOSIS — R079 Chest pain, unspecified: Secondary | ICD-10-CM

## 2017-06-02 DIAGNOSIS — R0602 Shortness of breath: Secondary | ICD-10-CM | POA: Diagnosis not present

## 2017-06-02 NOTE — Patient Instructions (Signed)
Please call your cardiologist today and try to get your appointment moved up.  In the mean time, we will schedule an Echo to evaluate your heart function further.

## 2017-06-02 NOTE — Progress Notes (Signed)
Pt states the pain is more like pressure when she breathes in and out. The pressure is in the center of chest and radiates to the back

## 2017-06-02 NOTE — Progress Notes (Signed)
Patient ID: Desiree Gutierrez, female    DOB: 1991/03/29  MRN: [id number]  CC: Follow-up   Subjective: Desiree Gutierrez is a 27 y.o. female who presents for ER f/u Her concerns today include:  Pt with hx of POTS syndrome, WPW s/p ablation 2011, constipation, IDA. Followed by cardiology at [facility]  Patient seen in ER on the 27th of last month with complaints of chest pain and shortness of breath that woke her from sleep CP, SOB that woke her from sleep.  Pain radiated down the LT arm.  Workup included a d-dimer which was positive but subsequent CT angiogram of chest was negative.  EKG revealed no acute ischemic changes or changes to suggest acute pericarditis.  Troponin was negative.  Patient told to follow-up with PCP and cardiology.  She had called her cardiologist's office prior to being seen in the emergency room.  She was told to be seen in the ER that her cardiologist would be given a message however she has not heard back from them. Since the ER she continues to have uncomfortable feeling when she breaths in and out.  Feels winded when she talks and when walking. Constant chest heaviness.  No palpitations  She also had question about her last lab tests that were done when I saw her last month.  Syphilis test was negative.  She did test positive for trichomonas.  She has completed treatment.  She informed her boyfriend about it and states he denies having any STI.  He is in [employer] and states that he is checked regularly. Patient Active Problem List   Diagnosis Date Noted  . Vertigo 10/27/2016  . Concussion with no loss of consciousness 09/19/2016  . Purpura (HCC) 10/15/2015  . Diastolic dysfunction 09/08/2015  . Vocal cord dysfunction 06/02/2015  . Pain of molar 02/13/2015  . Observed seizure-like activity (HCC) 02/02/2015  . Hypoglycemia   . POTS (postural orthostatic tachycardia syndrome) 06/01/2014  . Iron deficiency anemia 06/01/2014  . Syncope 12/03/2010  .  WOLFF-PARKINSON-WHITE (WPW) SYNDROME status post ablation 08/20/2009  . Constipation 08/19/2009     Current Outpatient Medications on File Prior to Visit  Medication Sig Dispense Refill  . Ascorbic Acid (VITAMIN C) 1000 MG tablet Take 1,000 mg by mouth daily.    . Calcium-Magnesium-Vitamin D (CALCIUM MAGNESIUM PO) Take 2 tablets by mouth daily.    . Cholecalciferol (D-5000) 5000 UNITS TABS Take 5,000 Units by mouth daily.    . Coenzyme Q10 (COQ10 PO) Take 1 tablet by mouth daily.    . Cyanocobalamin (RA VITAMIN B-12 TR) 1000 MCG TBCR Take 1,000 mcg by mouth daily.    . cyclobenzaprine (FLEXERIL) 5 MG tablet Take 1 tablet (5 mg total) by mouth at bedtime. (Patient not taking: Reported on 01/05/2017) 10 tablet 0  . D-Ribose (RIBOSE, D,) POWD Take 200 g by mouth daily.    . ferrous gluconate (FERGON) 324 MG tablet Take 1 tablet (324 mg total) by mouth daily with breakfast. (Patient not taking: Reported on 01/05/2017) 90 tablet 3  . glycerin adult 2 g suppository Place 1 suppository rectally as needed for constipation. (Patient not taking: Reported on 01/05/2017) 12 suppository 0  . ibuprofen (ADVIL,MOTRIN) 600 MG tablet Take 1 tablet (600 mg total) by mouth every 8 (eight) hours as needed. On day before and first few days of period 60 tablet 2  . metroNIDAZOLE (FLAGYL) 500 MG tablet Take 1 tablet (500 mg total) by mouth 2 (two) times daily. 14 tablet 0  .  Multiple Vitamin (MULTIVITAMIN WITH MINERALS) TABS tablet Take 1 tablet by mouth daily.    . Omega-3 1000 MG CAPS Take 1 capsule by mouth daily.    . Probiotic Product (PROBIOTIC PO) Take 1 tablet by mouth daily.    . sodium chloride 1 G tablet Take 1 g by mouth 2 (two) times daily.     No current facility-administered medications on file prior to visit.     Allergies  Allergen Reactions  . Amitiza [Lubiprostone] Anaphylaxis, Shortness Of Breath and Swelling  . Other Palpitations  . Ketorolac Palpitations  . Metoclopramide Other (See  Comments)    Heart raced  . Midodrine Other (See Comments)    migraines  . Fludrocortisone Rash    Social History   Socioeconomic History  . Marital status: Single    Spouse name: Not on file  . Number of children: 0  . Years of education: 57  . Highest education level: Not on file  Social Needs  . Financial resource strain: Not on file  . Food insecurity - worry: Not on file  . Food insecurity - inability: Not on file  . Transportation needs - medical: Not on file  . Transportation needs - non-medical: Not on file  Occupational History  . Occupation: Physicist, medical  Tobacco Use  . Smoking status: Never Smoker  . Smokeless tobacco: Never Used  Substance and Sexual Activity  . Alcohol use: No    Alcohol/week: 0.0 oz  . Drug use: No  . Sexual activity: No  Other Topics Concern  . Not on file  Social History Narrative   Lives at home with mother   Caffeine use: none    Family History  Problem Relation Age of Onset  . Diabetes Mother   . Hypertension Mother   . Stroke Mother   . Hypertension Brother   . Hyperlipidemia Maternal Grandmother   . Seizures Neg Hx     Past Surgical History:  Procedure Laterality Date  . CARDIAC ELECTROPHYSIOLOGY STUDY AND ABLATION  2011   Dr. Gevena Cotton at [facility]- left posterior pathway ablation for WPW  . LOOP RECORDER IMPLANT  01/22/2014   MDT LINQ implanted by Dr Alfonso Ellis [facility] for evaluation of palpitations/syncope  . TILT TABLE STUDY  2011   neurally mediated syncope    ROS: Review of Systems Negative except as stated above PHYSICAL EXAM: BP 125/79   Pulse 82   Temp 99.1 F (37.3 C) (Oral)   Resp 16   Wt 154 lb (69.9 kg)   LMP 05/04/2017 (Exact Date)   SpO2 100%   BMI 27.28 kg/m   Physical Exam  General appearance - alert, well appearing, and in no distress Mental status - alert, oriented to person, place, and time, normal mood, behavior, speech, dress, motor activity, and thought processes Neck - supple, no  significant adenopathy Chest - clear to auscultation, no wheezes, rales or rhonchi, symmetric air entry Heart - normal rate, regular rhythm, normal S1, S2, no murmurs, rubs, clicks or gallops Extremities - peripheral pulses normal, no pedal edema, no clubbing or cyanosis  ASSESSMENT AND PLAN: 1. Chest pain, unspecified type 2. SOB (shortness of breath) -Order an echo to evaluate heart function and check her valves.  In the meantime I recommend that she call her cardiologist's office in [place] and let them know that she was recently seen in the ER by her PCP and we recommend that she gets into see him as soon as possible.  If she  is unable to reach them or get an appointment I have asked her to let me know so that I can refer her to a cardiologist here in [place].  However patient states that she prefers [place] [facility] - ECHOCARDIOGRAM COMPLETE; Future  3.  We discussed the positive trichomonas.  Patient informed that he is a sexually transmitted infection and that both she and her partner needs to be treated before having unprotected intercourse again.   Patient was given the opportunity to ask questions.  Patient verbalized understanding of the plan and was able to repeat key elements of the plan.   Orders Placed This Encounter  Procedures  . ECHOCARDIOGRAM COMPLETE     Requested Prescriptions    No prescriptions requested or ordered in this encounter    No Follow-up on file.  Jonah Blueeborah Sandralee Tarkington, MD, FACP

## 2017-06-11 IMAGING — DX DG RIBS W/ CHEST 3+V BILAT
5 series · 5 of 5 positions shown · non-contrast
Comparison: 12/11/2014.

CLINICAL DATA: Fall.  Syncope.  Bilateral chest pain.

EXAM:
BILATERAL RIBS AND CHEST - 4+ VIEW

[chest pa]
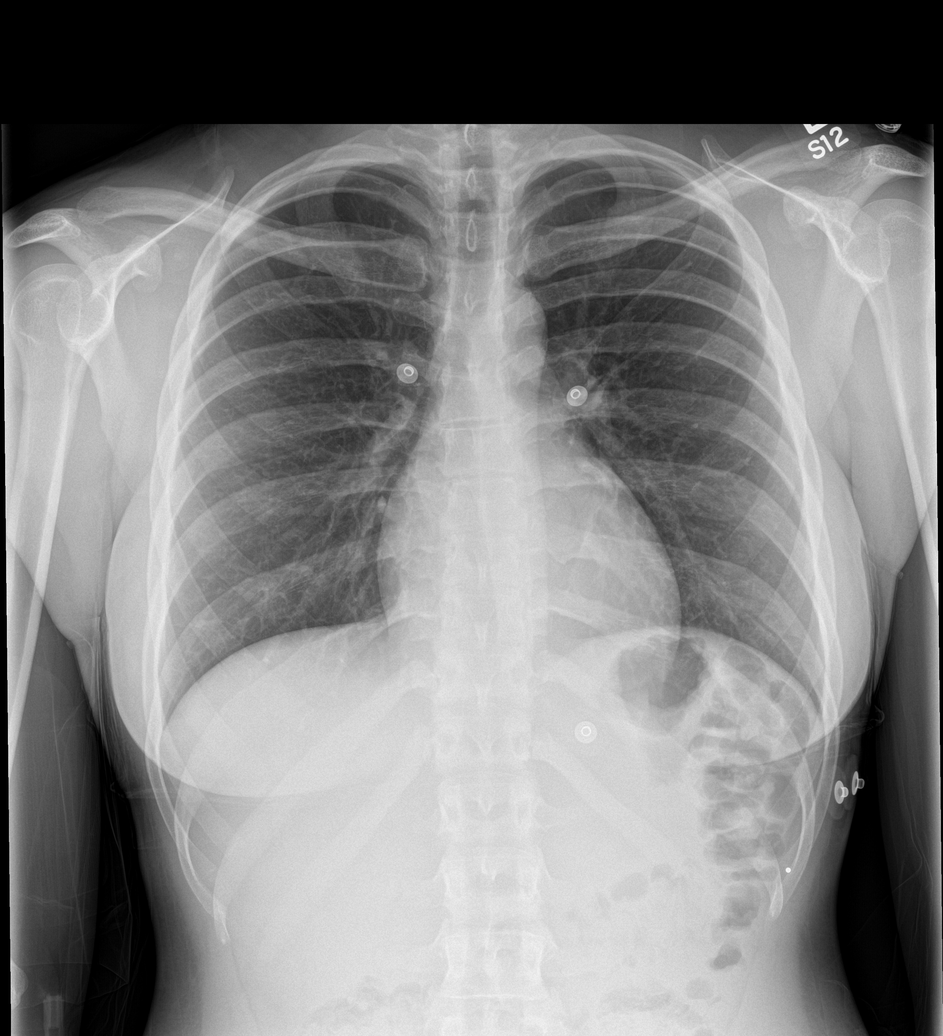

[rib obl]
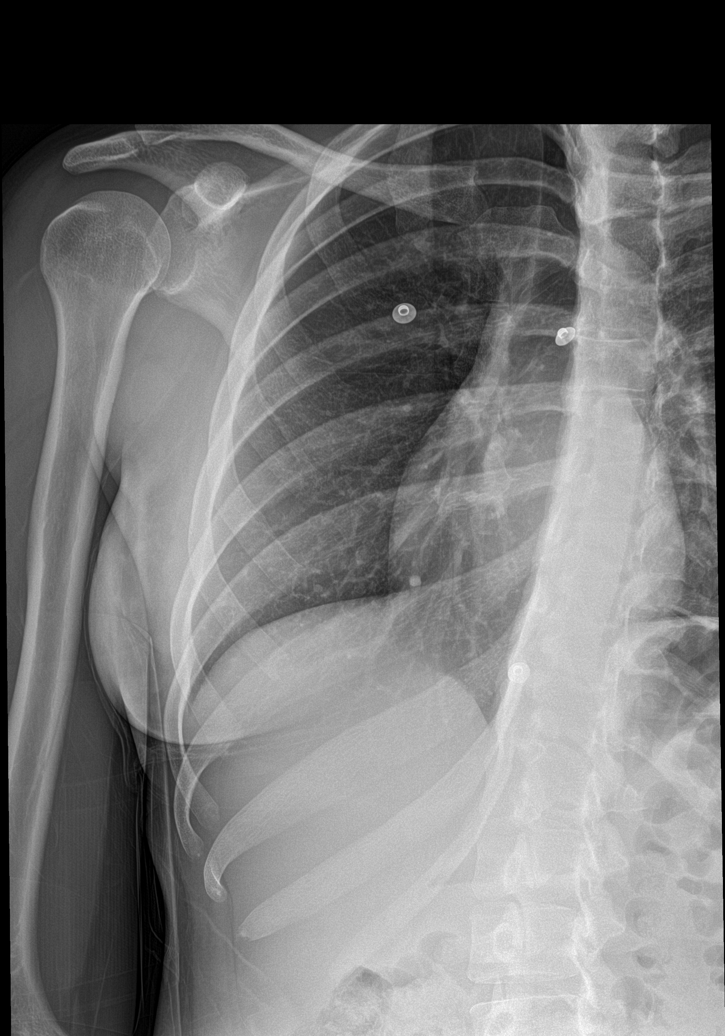

[rib ap (1 of 2)]
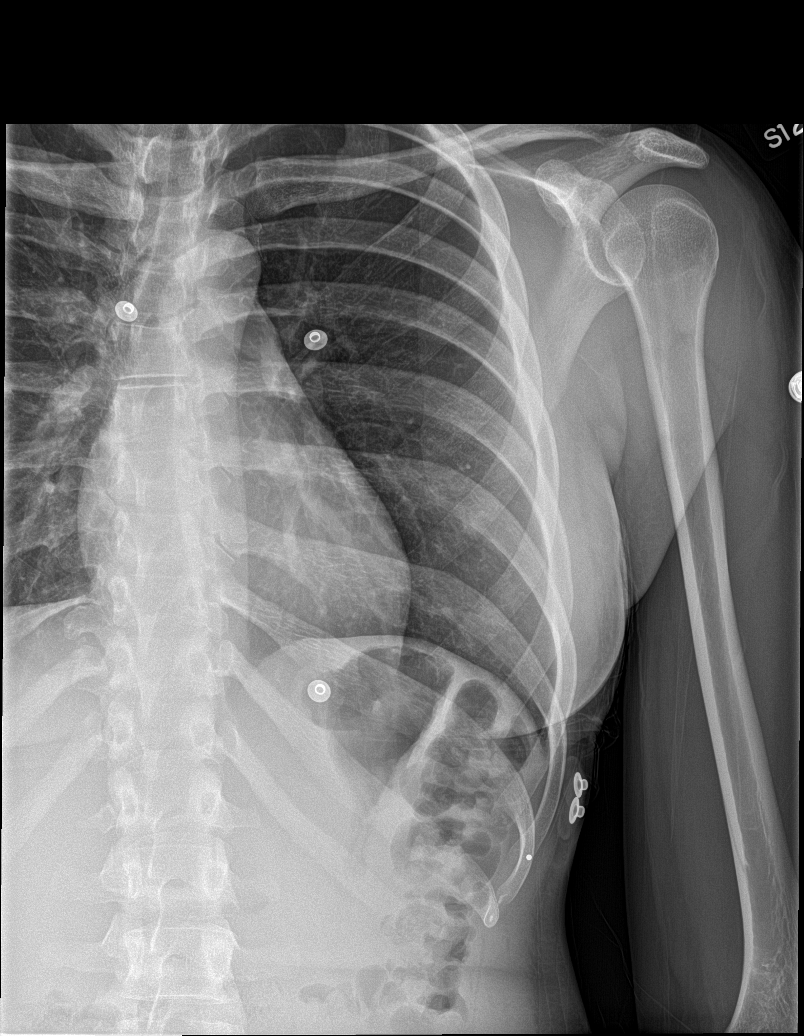

[rib ap (2 of 2)]
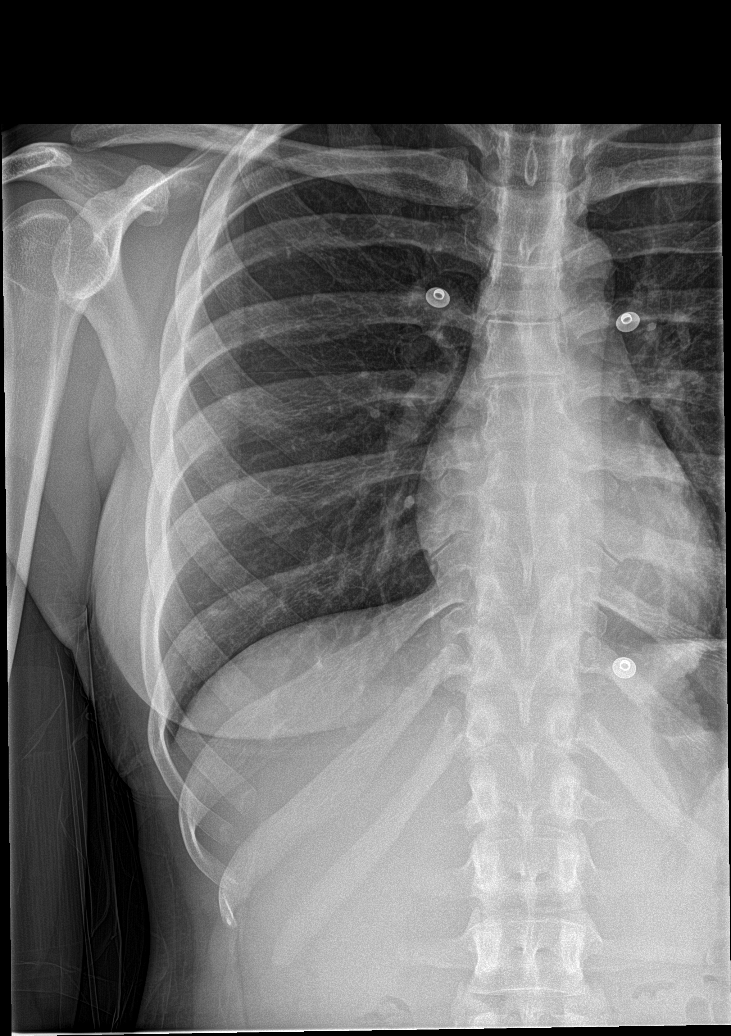

[chest ap]
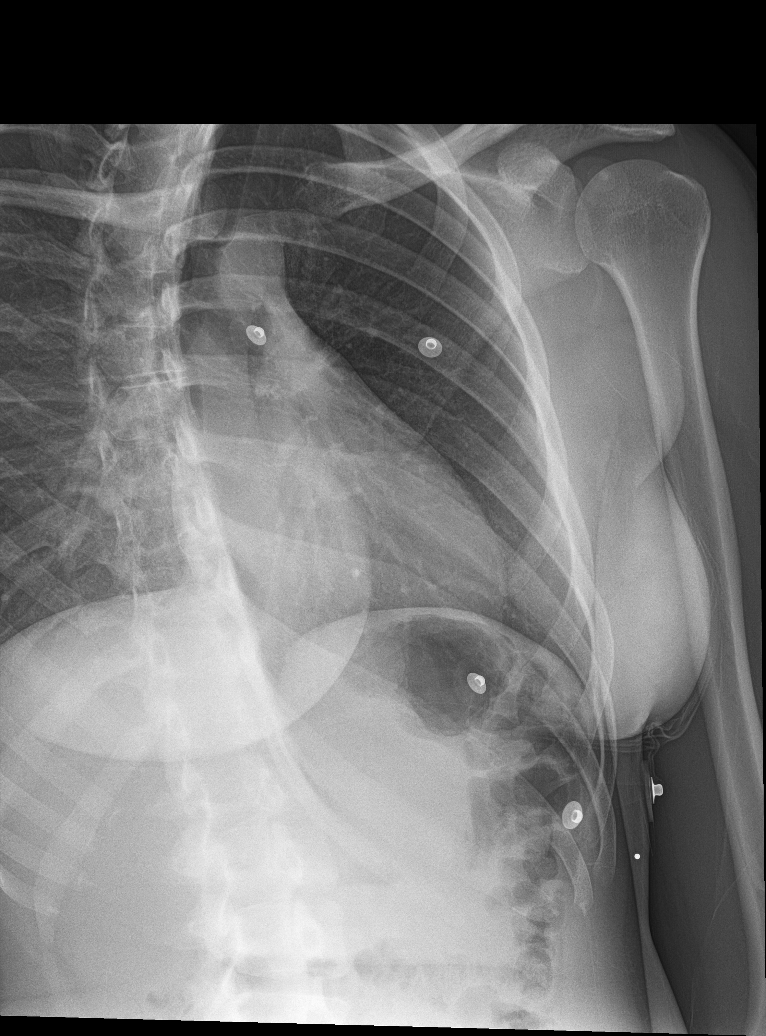

[5 of 5 positions shown; findings below may reference images not displayed]

FINDINGS: No fracture or other bone lesions are seen involving the ribs. There
is no evidence of pneumothorax or pleural effusion. Both lungs are
clear. Heart size and mediastinal contours are within normal limits.
IMPRESSION: Negative.

## 2017-06-15 ENCOUNTER — Ambulatory Visit (HOSPITAL_COMMUNITY)
Admission: RE | Admit: 2017-06-15 | Discharge: 2017-06-15 | Disposition: A | Discharge status: Home / Self Care | Payer: Medicaid Other | Source: Ambulatory Visit | Attending: Internal Medicine | Admitting: Internal Medicine

## 2017-06-15 DIAGNOSIS — R0602 Shortness of breath: Secondary | ICD-10-CM

## 2017-06-15 DIAGNOSIS — R55 Syncope and collapse: Secondary | ICD-10-CM | POA: Diagnosis not present

## 2017-06-15 DIAGNOSIS — R079 Chest pain, unspecified: Secondary | ICD-10-CM | POA: Diagnosis present

## 2017-06-15 DIAGNOSIS — I456 Pre-excitation syndrome: Secondary | ICD-10-CM | POA: Diagnosis not present

## 2017-06-15 NOTE — Progress Notes (Signed)
  Echocardiogram 2D Echocardiogram has been performed.  Pieter PartridgeBrooke S Iris Tatsch 06/15/2017, 3:37 PM

## 2017-06-16 ENCOUNTER — Other Ambulatory Visit (HOSPITAL_COMMUNITY): Payer: Medicaid Other

## 2017-07-31 ENCOUNTER — Telehealth: Payer: Self-pay

## 2017-07-31 NOTE — Telephone Encounter (Signed)
Brooke from [facility] called to let know that the patient was a No show for 07/31/17 @ 11:00 am with Dr. Marca Ancona   Call back number [phone]

## 2017-12-15 ENCOUNTER — Encounter: Payer: Self-pay | Admitting: Internal Medicine

## 2017-12-15 ENCOUNTER — Ambulatory Visit: Payer: Medicaid Other | Attending: Internal Medicine | Admitting: Internal Medicine

## 2017-12-15 ENCOUNTER — Telehealth: Payer: Self-pay

## 2017-12-15 VITALS — BP 111/72 | HR 73 | Temp 98.1°F | Resp 16 | Wt 145.2 lb

## 2017-12-15 DIAGNOSIS — R102 Pelvic and perineal pain: Secondary | ICD-10-CM | POA: Diagnosis not present

## 2017-12-15 DIAGNOSIS — K5900 Constipation, unspecified: Secondary | ICD-10-CM | POA: Insufficient documentation

## 2017-12-15 DIAGNOSIS — N920 Excessive and frequent menstruation with regular cycle: Secondary | ICD-10-CM | POA: Diagnosis not present

## 2017-12-15 DIAGNOSIS — N946 Dysmenorrhea, unspecified: Secondary | ICD-10-CM | POA: Insufficient documentation

## 2017-12-15 DIAGNOSIS — D500 Iron deficiency anemia secondary to blood loss (chronic): Secondary | ICD-10-CM | POA: Insufficient documentation

## 2017-12-15 DIAGNOSIS — Z9889 Other specified postprocedural states: Secondary | ICD-10-CM | POA: Insufficient documentation

## 2017-12-15 DIAGNOSIS — Z79899 Other long term (current) drug therapy: Secondary | ICD-10-CM | POA: Diagnosis not present

## 2017-12-15 DIAGNOSIS — Z2821 Immunization not carried out because of patient refusal: Secondary | ICD-10-CM

## 2017-12-15 MED ORDER — IBUPROFEN 600 MG PO TABS: 600.0000 mg | ORAL_TABLET | Freq: Three times a day (TID) | ORAL | 2 refills | Status: DC | PRN

## 2017-12-15 MED ORDER — POLYETHYLENE GLYCOL 3350 17 GM/SCOOP PO POWD: 17.0000 g | Freq: Every day | ORAL | 3 refills | Status: DC | PRN

## 2017-12-15 NOTE — Telephone Encounter (Signed)
Went on the [employer] website and did prior auth for US  CPT- [id number] Ultrasound pelvic(nonobstetric), B-scan and/or real time with image documentation, complete           [id number] Ultrasound transvaginal  ICD- R10.2 Pelvic and perineal pain         N92.0 Excessive and frequent menstruation with regular cycle  Authorization #[id number] Effective- 12/15/2017 Expires- 01/14/2018  Contacted the scheduling department to schedule us for pelvic and transvaginal and to give authorization #. Spoke to Rodney Boozeasha  [facility] 2017 [facility]4pm  [facility] Pt is to arrive @345pm 

## 2017-12-15 NOTE — Progress Notes (Signed)
Patient ID: Desiree Gutierrez, female    DOB: 04-11-1990  MRN: [id number]  CC: Pelvic Pain   Subjective: Desiree Gutierrez is a 27 y.o. female who presents for chronic ds management. Her concerns today include:  Pt with hx of POTS syndrome, WPW s/p ablation 2011, constipation, IDA. Followed by cardiology at [facility]  POTS/WPW: She has seen her cardiologist at [facility] since last visit.  Notes reviewed.  Constipation:  Saw GI.  Doing better.  She found that using MiraLAX with warm fluid rather than cold water seems to work better for her.  She takes it daily.  She feels that iron supplement tablets contributes to the constipation.  Complains of heavy painful menses over the last 2-3 cycles.  Menses usually last 7 days.  Over the last 3 cycles she has had to change pads about 4-5 times a day where she is soaking through.  Also associated with significant left sided pelvic pain that would last even after menses has ended.  She has noted some frequent urination also with this but no dysuria or polydipsia.  Patient Active Problem List   Diagnosis Date Noted  . Vertigo 10/27/2016  . Concussion with no loss of consciousness 09/19/2016  . Purpura (HCC) 10/15/2015  . Diastolic dysfunction 09/08/2015  . Vocal cord dysfunction 06/02/2015  . Pain of molar 02/13/2015  . Observed seizure-like activity (HCC) 02/02/2015  . Hypoglycemia   . POTS (postural orthostatic tachycardia syndrome) 06/01/2014  . Iron deficiency anemia 06/01/2014  . Syncope 12/03/2010  . WOLFF-PARKINSON-WHITE (WPW) SYNDROME status post ablation 08/20/2009  . Constipation 08/19/2009     Current Outpatient Medications on File Prior to Visit  Medication Sig Dispense Refill  . Ascorbic Acid (VITAMIN C) 1000 MG tablet Take 1,000 mg by mouth daily.    . Calcium-Magnesium-Vitamin D (CALCIUM MAGNESIUM PO) Take 2 tablets by mouth daily.    . Cholecalciferol (D-5000) 5000 UNITS TABS Take 5,000 Units by mouth daily.    .  Coenzyme Q10 (COQ10 PO) Take 1 tablet by mouth daily.    . Cyanocobalamin (RA VITAMIN B-12 TR) 1000 MCG TBCR Take 1,000 mcg by mouth daily.    Marland Kitchen D-Ribose (RIBOSE, D,) POWD Take 200 g by mouth daily.    . ferrous gluconate (FERGON) 324 MG tablet Take 1 tablet (324 mg total) by mouth daily with breakfast. (Patient not taking: Reported on 01/05/2017) 90 tablet 3  . glycerin adult 2 g suppository Place 1 suppository rectally as needed for constipation. (Patient not taking: Reported on 01/05/2017) 12 suppository 0  . Multiple Vitamin (MULTIVITAMIN WITH MINERALS) TABS tablet Take 1 tablet by mouth daily.    . Omega-3 1000 MG CAPS Take 1 capsule by mouth daily.    . Probiotic Product (PROBIOTIC PO) Take 1 tablet by mouth daily.    . sodium chloride 1 G tablet Take 1 g by mouth 2 (two) times daily.     No current facility-administered medications on file prior to visit.     Allergies  Allergen Reactions  . Amitiza [Lubiprostone] Anaphylaxis, Shortness Of Breath and Swelling  . Other Palpitations  . Ketorolac Palpitations  . Metoclopramide Other (See Comments)    Heart raced  . Midodrine Other (See Comments)    migraines  . Fludrocortisone Rash    Social History   Socioeconomic History  . Marital status: Single    Spouse name: Not on file  . Number of children: 0  . Years of education: 11  . Highest  education level: Not on file  Occupational History  . Occupation: Physicist, medical  Social Needs  . Financial resource strain: Not on file  . Food insecurity:    Worry: Not on file    Inability: Not on file  . Transportation needs:    Medical: Not on file    Non-medical: Not on file  Tobacco Use  . Smoking status: Never Smoker  . Smokeless tobacco: Never Used  Substance and Sexual Activity  . Alcohol use: No    Alcohol/week: 0.0 standard drinks  . Drug use: No  . Sexual activity: Never  Lifestyle  . Physical activity:    Days per week: Not on file    Minutes per session: Not on  file  . Stress: Not on file  Relationships  . Social connections:    Talks on phone: Not on file    Gets together: Not on file    Attends religious service: Not on file    Active member of club or organization: Not on file    Attends meetings of clubs or organizations: Not on file    Relationship status: Not on file  . Intimate partner violence:    Fear of current or ex partner: Not on file    Emotionally abused: Not on file    Physically abused: Not on file    Forced sexual activity: Not on file  Other Topics Concern  . Not on file  Social History Narrative   Lives at home with mother   Caffeine use: none    Family History  Problem Relation Age of Onset  . Diabetes Mother   . Hypertension Mother   . Stroke Mother   . Hypertension Brother   . Hyperlipidemia Maternal Grandmother   . Seizures Neg Hx     Past Surgical History:  Procedure Laterality Date  . CARDIAC ELECTROPHYSIOLOGY STUDY AND ABLATION  2011   Dr. Gevena Cotton at [facility]- left posterior pathway ablation for WPW  . LOOP RECORDER IMPLANT  01/22/2014   MDT LINQ implanted by Dr Alfonso Ellis [facility] for evaluation of palpitations/syncope  . TILT TABLE STUDY  2011   neurally mediated syncope    ROS: Review of Systems Negative except as above. PHYSICAL EXAM: BP 111/72   Pulse 73   Temp 98.1 F (36.7 C) (Oral)   Resp 16   Wt 145 lb 3.2 oz (65.9 kg)   SpO2 97%   BMI 25.72 kg/m   Physical Exam  General appearance - alert, well appearing, and in no distress Mental status - normal mood, behavior, speech, dress, motor activity, and thought processes Chest - clear to auscultation, no wheezes, rales or rhonchi, symmetric air entry Heart - normal rate, regular rhythm, normal S1, S2, no murmurs, rubs, clicks or gallops Abdomen - soft, nontender, nondistended, no masses or organomegaly  ASSESSMENT AND PLAN: 1. Dysmenorrhea - ibuprofen (ADVIL,MOTRIN) 600 MG tablet; Take 1 tablet (600 mg total) by mouth every 8 (eight)  hours as needed. On day before and first few days of period  Dispense: 60 tablet; Refill: 2  2. Menorrhagia with regular cycle - US Pelvis Complete; Future - US Transvaginal Non-OB; Future - CBC  3. Pelvic pain  - US Pelvis Complete; Future - US Transvaginal Non-OB; Future  4. Iron deficiency anemia due to chronic blood loss Recheck CBC today.  5. Constipation, unspecified constipation type Continue MiraLAX as needed.  6. Influenza vaccination declined  Patient was given the opportunity to ask questions.  Patient verbalized  understanding of the plan and was able to repeat key elements of the plan.   Orders Placed This Encounter  Procedures  . US Pelvis Complete  . US Transvaginal Non-OB  . CBC     Requested Prescriptions   Signed Prescriptions Disp Refills  . ibuprofen (ADVIL,MOTRIN) 600 MG tablet 60 tablet 2    Sig: Take 1 tablet (600 mg total) by mouth every 8 (eight) hours as needed. On day before and first few days of period  . polyethylene glycol powder (GLYCOLAX/MIRALAX) powder 3350 g 3    Sig: Take 17 g by mouth daily as needed.    Return in about 4 months (around 04/16/2018).  Jonah Blueeborah Mays Paino, MD, FACP

## 2017-12-16 LAB — CBC
HEMATOCRIT: 34.5 % (ref 34.0–46.6)
HEMOGLOBIN: 11.3 g/dL (ref 11.1–15.9)
MCH: 28.1 pg (ref 26.6–33.0)
MCHC: 32.8 g/dL (ref 31.5–35.7)
MCV: 86 fL (ref 79–97)
Platelets: 248 10*3/uL (ref 150–450)
RBC: 4.02 x10E6/uL (ref 3.77–5.28)
RDW: 11.5 % — ABNORMAL LOW (ref 12.3–15.4)
WBC: 6 10*3/uL (ref 3.4–10.8)

## 2017-12-19 ENCOUNTER — Ambulatory Visit (HOSPITAL_COMMUNITY)
Admission: RE | Admit: 2017-12-19 | Discharge: 2017-12-19 | Disposition: A | Discharge status: Home / Self Care | Payer: Medicaid Other | Source: Ambulatory Visit | Attending: Internal Medicine | Admitting: Internal Medicine

## 2017-12-19 DIAGNOSIS — N83202 Unspecified ovarian cyst, left side: Secondary | ICD-10-CM | POA: Diagnosis not present

## 2017-12-19 DIAGNOSIS — N920 Excessive and frequent menstruation with regular cycle: Secondary | ICD-10-CM

## 2017-12-19 DIAGNOSIS — R102 Pelvic and perineal pain: Secondary | ICD-10-CM | POA: Insufficient documentation

## 2017-12-24 ENCOUNTER — Encounter: Payer: Self-pay | Admitting: Internal Medicine

## 2017-12-27 ENCOUNTER — Other Ambulatory Visit (HOSPITAL_COMMUNITY)
Admission: RE | Admit: 2017-12-27 | Discharge: 2017-12-27 | Disposition: A | Discharge status: Home / Self Care | Payer: Medicaid Other | Source: Ambulatory Visit | Attending: Internal Medicine | Admitting: Internal Medicine

## 2017-12-27 ENCOUNTER — Ambulatory Visit: Payer: Medicaid Other | Attending: Internal Medicine | Admitting: Physician Assistant

## 2017-12-27 VITALS — BP 114/78 | HR 88 | Temp 99.2°F | Resp 18 | Ht 63.0 in | Wt 146.0 lb

## 2017-12-27 DIAGNOSIS — N83202 Unspecified ovarian cyst, left side: Secondary | ICD-10-CM | POA: Diagnosis not present

## 2017-12-27 DIAGNOSIS — R102 Pelvic and perineal pain: Secondary | ICD-10-CM

## 2017-12-27 DIAGNOSIS — N946 Dysmenorrhea, unspecified: Secondary | ICD-10-CM | POA: Insufficient documentation

## 2017-12-27 LAB — POCT URINALYSIS DIP (CLINITEK)
BILIRUBIN UA: NEGATIVE mg/dL
Bilirubin, UA: NEGATIVE
Blood, UA: NEGATIVE
Glucose, UA: NEGATIVE mg/dL
LEUKOCYTES UA: NEGATIVE
Nitrite, UA: NEGATIVE
PH UA: 7 (ref 5.0–8.0)
PROTEIN: NEGATIVE
Spec Grav, UA: 1.02 (ref 1.010–1.025)
Urobilinogen, UA: 0.2 E.U./dL

## 2017-12-27 LAB — POCT URINE PREGNANCY: Preg Test, Ur: NEGATIVE

## 2017-12-27 MED ORDER — IBUPROFEN 600 MG PO TABS: 600.0000 mg | ORAL_TABLET | Freq: Three times a day (TID) | ORAL | 2 refills | Status: DC

## 2017-12-27 MED FILL — IBUPROFEN 600 MG TABLET: 600 | 20 days supply | Qty: 60 | Fill #0

## 2017-12-27 NOTE — Progress Notes (Signed)
Patient ID: Desiree Gutierrez, female   DOB: 1990/06/28, 27 y.o.   MRN: [id number]      Desiree Gutierrez, is a 26 y.o. female  YNW:[id number]  MVH:[id number]  DOB - 1990-09-26  Subjective:  Chief Complaint and HPI: Desiree Gutierrez is a 27 y.o. female here today-Saw Dr Laural Benes about 1 week ago with pelvic pain and sent for U/S.  Diagnosed with ovarian cyst. Still having pain.  Not taking any OTC meds.  No f/c.  Some urinary frequency.  LMP 12/07/2017, so a little over mid-cycle.  Monogamous with partner.  No dysuria.  No N/V.  No changes in BMs.  Needs repeat U/S in 6-12 weeks.  U/S read as "complex cyst."  Pain not worse, but no better.  No vaginal discharge.  Appetite is good.    ROS:   Constitutional:  No f/c, No night sweats, No unexplained weight loss. EENT:  No vision changes, No blurry vision, No hearing changes. No mouth, throat, or ear problems.  Respiratory: No cough, No SOB Cardiac: No CP, no palpitations GI:  No abd pain, No N/V/D. + LLQ pain GU: No Urinary s/sx other than frequency Musculoskeletal: No joint pain Neuro: No headache, no dizziness, no motor weakness.  Skin: No rash Endocrine:  No polydipsia. No polyuria.  Psych: Denies SI/HI  No problems updated.  ALLERGIES: Allergies  Allergen Reactions  . Amitiza [Lubiprostone] Anaphylaxis, Shortness Of Breath and Swelling  . Other Palpitations  . Ketorolac Palpitations  . Metoclopramide Other (See Comments)    Heart raced  . Midodrine Other (See Comments)    migraines  . Fludrocortisone Rash    PAST MEDICAL HISTORY: Past Medical History:  Diagnosis Date  . Headache   . Palpitations    a. s/p MDT ILR implanted by Dr [facility]fon[facility])  . POTS (postural orthostatic tachycardia syndrome)   . Vasovagal syncope    a. positive tilt 2011 b. failed medical therapy with Florinef, Midodrine, Inderal, Celexa  . WPW (Wolff-Parkinson-White syndrome)    a. s/p ablation (2011) Dr Gevena Cotton at [facility] (posterior lateral pathway)     MEDICATIONS AT HOME: Prior to Admission medications   Medication Sig Start Date End Date Taking? Authorizing Provider  Ascorbic Acid (VITAMIN C) 1000 MG tablet Take 1,000 mg by mouth daily.   Yes [provider]  Calcium-Magnesium-Vitamin D (CALCIUM MAGNESIUM PO) Take 2 tablets by mouth daily.   Yes [provider]  Cholecalciferol (D-5000) 5000 UNITS TABS Take 5,000 Units by mouth daily.   Yes [provider]  Coenzyme Q10 (COQ10 PO) Take 1 tablet by mouth daily.   Yes [provider]  Cyanocobalamin (RA VITAMIN B-12 TR) 1000 MCG TBCR Take 1,000 mcg by mouth daily.   Yes [provider]  D-Ribose (RIBOSE, D,) POWD Take 200 g by mouth daily.   Yes [provider]  ibuprofen (ADVIL,MOTRIN) 600 MG tablet Take 1 tablet (600 mg total) by mouth 3 (three) times daily. X 10 days then prn pain 12/27/17  Yes Mariangela Heldt M, PA-C  Multiple Vitamin (MULTIVITAMIN WITH MINERALS) TABS tablet Take 1 tablet by mouth daily.   Yes [provider]  Omega-3 1000 MG CAPS Take 1 capsule by mouth daily.   Yes [provider]  polyethylene glycol powder (GLYCOLAX/MIRALAX) powder Take 17 g by mouth daily as needed. 12/15/17  Yes Marcine Matar, MD  Probiotic Product (PROBIOTIC PO) Take 1 tablet by mouth daily.   Yes [provider]  sodium chloride 1 G tablet  Take 1 g by mouth 2 (two) times daily.   Yes [provider]  ferrous gluconate (FERGON) 324 MG tablet Take 1 tablet (324 mg total) by mouth daily with breakfast. Patient not taking: Reported on 01/05/2017 07/18/16   [name], MD  glycerin adult 2 g suppository Place 1 suppository rectally as needed for constipation. Patient not taking: Reported on 01/05/2017 09/19/16   [name], MD     Objective:  EXAM:   Vitals:   12/27/17 1509  BP: 114/78  Pulse: 88  Resp: 18  Temp: 99.2 F (37.3 C)  TempSrc: Oral  SpO2: 100%  Weight: 146 lb (66.2 kg)   Height: 5\' 3"  (1.6 m)    General appearance : A&OX3. NAD. Non-toxic-appearing HEENT: Atraumatic and Normocephalic.  PERRLA. EOM intact.   Neck: supple, no JVD. No cervical lymphadenopathy. No thyromegaly Chest/Lungs:  Breathing-non-labored, Good air entry bilaterally, breath sounds normal without rales, rhonchi, or wheezing  CVS: S1 S2 regular, no murmurs, gallops, rubs  Abdomen: Bowel sounds present, Non tender and not distended with no gaurding, rigidity or rebound. Extremities: Bilateral Lower Ext shows no edema, both legs are warm to touch with = pulse throughout Neurology:  CN II-XII grossly intact, Non focal.   Psych:  TP linear. J/I WNL. Normal speech. Appropriate eye contact and affect.  Skin:  No Rash  Data Review Lab Results  Component Value Date   HGBA1C 5.20 11/06/2014     Assessment & Plan   1. Pelvic pain No red flags, non-acute abdomen-known L ovarian cyst.   - POCT URINALYSIS DIP (CLINITEK) - POCT urine pregnancy - ibuprofen (ADVIL,MOTRIN) 600 MG tablet; Take 1 tablet (600 mg total) by mouth 3 (three) times daily. X 10 days then prn pain  Dispense: 60 tablet; Refill: 2 - Urine cytology ancillary only  2. Dysmenorrhea - ibuprofen (ADVIL,MOTRIN) 600 MG tablet; Take 1 tablet (600 mg total) by mouth 3 (three) times daily. X 10 days then prn pain  Dispense: 60 tablet; Refill: 2  3. Left ovarian cyst - ibuprofen (ADVIL,MOTRIN) 600 MG tablet; Take 1 tablet (600 mg total) by mouth 3 (three) times daily. X 10 days then prn pain  Dispense: 60 tablet; Refill: 2 - US Transvaginal Non-OB; Future 2-3 months  To ED if pain worsens/fever develops.   Patient have been counseled extensively about nutrition and exercise  Return for keep Apr 16, 2018 appt with Dr Laural Benes.  The patient was given clear instructions to go to ER or return to medical center if symptoms don't improve, worsen or new problems develop. The patient verbalized understanding. The patient was told to call  to get lab results if they haven't heard anything in the next week.     Georgian Co, PA-C [facility] [place], [place] [phone]   12/27/2017, 3:30 PM

## 2017-12-29 LAB — URINE CYTOLOGY ANCILLARY ONLY
Chlamydia: NEGATIVE
Neisseria Gonorrhea: NEGATIVE
Trichomonas: NEGATIVE

## 2018-01-01 LAB — URINE CYTOLOGY ANCILLARY ONLY
Bacterial vaginitis: NEGATIVE
CANDIDA VAGINITIS: NEGATIVE

## 2018-01-03 ENCOUNTER — Telehealth: Payer: Self-pay | Admitting: *Deleted

## 2018-01-03 NOTE — Telephone Encounter (Signed)
-----   Message from Anders Simmonds, [place] sent at 01/03/2018  8:43 AM EDT ----- Your urine test for yeast, bacterial vaginosis, chlamydia, gonorrhea, and trichomonas were all negative.  Follow-up as needed.  Thanks, Georgian Co, PA-C

## 2018-01-03 NOTE — Telephone Encounter (Signed)
Patient verified DOB Patient is aware of no infection being noted in the urine. No further questions at this time.

## 2018-01-30 ENCOUNTER — Telehealth: Payer: Self-pay | Admitting: Internal Medicine

## 2018-01-30 NOTE — Telephone Encounter (Signed)
Brendi from Pre service called to request a pre-cert completed for  -US Transvaginal Non-OB on 02/02/2018 Please follow up

## 2018-01-30 NOTE — Telephone Encounter (Signed)
Went on the [employer] and prior Berkley Harvey   [id number] ultrasound Transvaginal   ICD- N83.202 Unspecified ovarian cyst, left side    Authorization #[id number] Effective- 01/30/2018 Expires- 03/01/2018  Returned Brendi call and there was no answer  Called the scheduling department and spoke to [place] and gave prior auth number

## 2018-02-02 ENCOUNTER — Ambulatory Visit (HOSPITAL_COMMUNITY)
Admission: RE | Admit: 2018-02-02 | Discharge: 2018-02-02 | Disposition: A | Discharge status: Home / Self Care | Payer: Medicaid Other | Source: Ambulatory Visit | Attending: Physician Assistant | Admitting: Physician Assistant

## 2018-02-02 DIAGNOSIS — N83202 Unspecified ovarian cyst, left side: Secondary | ICD-10-CM | POA: Diagnosis present

## 2018-02-02 DIAGNOSIS — R9389 Abnormal findings on diagnostic imaging of other specified body structures: Secondary | ICD-10-CM | POA: Insufficient documentation

## 2018-02-02 DIAGNOSIS — N838 Other noninflammatory disorders of ovary, fallopian tube and broad ligament: Secondary | ICD-10-CM | POA: Insufficient documentation

## 2018-02-04 ENCOUNTER — Other Ambulatory Visit: Payer: Self-pay | Admitting: Physician Assistant

## 2018-02-04 DIAGNOSIS — N831 Corpus luteum cyst of ovary, unspecified side: Secondary | ICD-10-CM

## 2018-02-05 ENCOUNTER — Telehealth: Payer: Self-pay | Admitting: *Deleted

## 2018-02-05 NOTE — Telephone Encounter (Signed)
-----   Message from Anders Simmonds, [place] sent at 02/04/2018  5:32 PM EST ----- Please call patient.  The cyst that was previously on her ultrasound has resolved.  However, now, the lining of her uterus is thickened and there is a new structure that could possibly indicate early pregnancy.  She should take a pregnancy test or come into the office for a pregnancy test.  If she is not pregnant, she will need another ultrasound in about 2 months after.  This will need to be scheduled immediately following her period in 2 months.  I am placing the order for a future ultrasound.  Thanks, Georgian Co, PA-C

## 2018-02-05 NOTE — Telephone Encounter (Signed)
Patient verified DOB Patient is aware of cyst resolving and new structure being noted which could indicate early pregnancy. Patient states she has not been sexually active for months and had a cycle last month. Patient will contact the office by Friday to update on cycle and to schedule future [place] if cycle does come on to determine the new structure. No further questions.

## 2018-02-27 ENCOUNTER — Encounter: Payer: Self-pay | Admitting: Physician Assistant

## 2018-03-12 ENCOUNTER — Other Ambulatory Visit: Payer: Self-pay | Admitting: Internal Medicine

## 2018-03-12 DIAGNOSIS — N920 Excessive and frequent menstruation with regular cycle: Secondary | ICD-10-CM

## 2018-03-12 DIAGNOSIS — N946 Dysmenorrhea, unspecified: Secondary | ICD-10-CM

## 2018-03-12 DIAGNOSIS — N83202 Unspecified ovarian cyst, left side: Secondary | ICD-10-CM

## 2018-03-26 ENCOUNTER — Encounter: Payer: Self-pay | Admitting: Obstetrics & Gynecology

## 2018-04-16 ENCOUNTER — Ambulatory Visit: Payer: Medicaid Other | Admitting: Internal Medicine

## 2018-04-20 ENCOUNTER — Ambulatory Visit: Payer: Medicaid Other | Attending: Internal Medicine | Admitting: Internal Medicine

## 2018-04-20 ENCOUNTER — Encounter: Payer: Self-pay | Admitting: Internal Medicine

## 2018-04-20 VITALS — BP 119/76 | HR 83 | Temp 98.3°F | Resp 16 | Ht 63.0 in | Wt 149.6 lb

## 2018-04-20 DIAGNOSIS — D500 Iron deficiency anemia secondary to blood loss (chronic): Secondary | ICD-10-CM | POA: Diagnosis not present

## 2018-04-20 DIAGNOSIS — I456 Pre-excitation syndrome: Secondary | ICD-10-CM | POA: Insufficient documentation

## 2018-04-20 DIAGNOSIS — Z79899 Other long term (current) drug therapy: Secondary | ICD-10-CM | POA: Diagnosis not present

## 2018-04-20 DIAGNOSIS — I498 Other specified cardiac arrhythmias: Secondary | ICD-10-CM | POA: Insufficient documentation

## 2018-04-20 DIAGNOSIS — G90A Postural orthostatic tachycardia syndrome (POTS): Secondary | ICD-10-CM

## 2018-04-20 DIAGNOSIS — K5909 Other constipation: Secondary | ICD-10-CM | POA: Diagnosis not present

## 2018-04-20 DIAGNOSIS — R000 Tachycardia, unspecified: Secondary | ICD-10-CM

## 2018-04-20 DIAGNOSIS — Z8249 Family history of ischemic heart disease and other diseases of the circulatory system: Secondary | ICD-10-CM | POA: Diagnosis not present

## 2018-04-20 DIAGNOSIS — R9389 Abnormal findings on diagnostic imaging of other specified body structures: Secondary | ICD-10-CM | POA: Insufficient documentation

## 2018-04-20 DIAGNOSIS — I951 Orthostatic hypotension: Secondary | ICD-10-CM

## 2018-04-20 DIAGNOSIS — N920 Excessive and frequent menstruation with regular cycle: Secondary | ICD-10-CM

## 2018-04-20 DIAGNOSIS — R002 Palpitations: Secondary | ICD-10-CM

## 2018-04-20 MED ORDER — POLYETHYLENE GLYCOL 3350 17 GM/SCOOP PO POWD: 17.0000 g | Freq: Every day | ORAL | 3 refills | Status: DC | PRN

## 2018-04-20 MED FILL — POLYETHYLENE GLYCOL 3350 PO: 28 days supply | Qty: 476 | Fill #0

## 2018-04-20 NOTE — Progress Notes (Signed)
Patient ID: Desiree BassetDemetri Gutierrez, female    DOB: 09/04/1990  MRN: [id number]  CC: Follow-up (4 month )   Subjective: Desiree Gutierrez is a 28 y.o. female who presents for chronic ds management Her concerns today include:  Pt with hx of POTS syndrome, WPW s/p ablation 2011, constipation, IDA. [facility]  Dysmenorrhea/menorrhagia: She continues to have heavy periods that last about 7 days.  On last visit with me we did a pelvic ultrasound that revealed 2.4 cm mildly complex septated left ovarian cyst.  Short-term follow-up was done in November and this cyst had resolved but new complex nodule was seen on the left ovary measuring 2.5 x 1.6 x 1.8 cm.  She was also noted to have thickened endometrium.  However patient states that the ultrasound was done several days before her menstrual cycle began.  She has an appointment coming up with gynecology.  -She takes iron supplement daily.  She requests refill on MiraLAX to help decrease constipation from the iron supplement.  Saw cardiology last mth.  She had reported a syncope episode while working out in the gym.  She was encouraged to use compression socks, stay adequately hydrated and heart monitor was ordered.  However patient has not done the heart monitor as yet because she was told to do it only if symptoms increased.  Since being seen she reports increased episodes of palpitations and a syncope episode.  She feels tired and fatigue after having palpitations that usually lasts for a few minutes.  She has sent a message via MyChart to her cardiologist but has not heard back from them as yet. She also reports that her feet and lower legs were swollen blue for about 1 week several weeks ago.  She denies any increase exposure to cold weather at that time.  She has not had a recurrent episode   Patient Active Problem List   Diagnosis Date Noted  . Vertigo 10/27/2016  . Concussion with no loss of consciousness 09/19/2016  . Purpura  (HCC) 10/15/2015  . Diastolic dysfunction 09/08/2015  . Vocal cord dysfunction 06/02/2015  . Pain of molar 02/13/2015  . Observed seizure-like activity (HCC) 02/02/2015  . Hypoglycemia   . POTS (postural orthostatic tachycardia syndrome) 06/01/2014  . Iron deficiency anemia 06/01/2014  . Syncope 12/03/2010  . WOLFF-PARKINSON-WHITE (WPW) SYNDROME status post ablation 08/20/2009  . Constipation 08/19/2009     Current Outpatient Medications on File Prior to Visit  Medication Sig Dispense Refill  . Ascorbic Acid (VITAMIN C) 1000 MG tablet Take 1,000 mg by mouth daily.    . Calcium-Magnesium-Vitamin D (CALCIUM MAGNESIUM PO) Take 2 tablets by mouth daily.    . Cholecalciferol (D-5000) 5000 UNITS TABS Take 5,000 Units by mouth daily.    . Coenzyme Q10 (COQ10 PO) Take 1 tablet by mouth daily.    . Cyanocobalamin (RA VITAMIN B-12 TR) 1000 MCG TBCR Take 1,000 mcg by mouth daily.    Marland Kitchen. D-Ribose (RIBOSE, D,) POWD Take 200 g by mouth daily.    . ferrous gluconate (FERGON) 324 MG tablet Take 1 tablet (324 mg total) by mouth daily with breakfast. (Patient not taking: Reported on 01/05/2017) 90 tablet 3  . glycerin adult 2 g suppository Place 1 suppository rectally as needed for constipation. (Patient not taking: Reported on 01/05/2017) 12 suppository 0  . ibuprofen (ADVIL,MOTRIN) 600 MG tablet Take 1 tablet (600 mg total) by mouth 3 (three) times daily. X 10 days then prn pain 60 tablet 2  .  Multiple Vitamin (MULTIVITAMIN WITH MINERALS) TABS tablet Take 1 tablet by mouth daily.    . Omega-3 1000 MG CAPS Take 1 capsule by mouth daily.    . Probiotic Product (PROBIOTIC PO) Take 1 tablet by mouth daily.    . sodium chloride 1 G tablet Take 1 g by mouth 2 (two) times daily.     No current facility-administered medications on file prior to visit.     Allergies  Allergen Reactions  . Amitiza [Lubiprostone] Anaphylaxis, Shortness Of Breath and Swelling  . Other Palpitations  . Ketorolac Palpitations  .  Metoclopramide Other (See Comments)    Heart raced  . Midodrine Other (See Comments)    migraines  . Fludrocortisone Rash    Social History   Socioeconomic History  . Marital status: Single    Spouse name: Not on file  . Number of children: 0  . Years of education: 6115  . Highest education level: Not on file  Occupational History  . Occupation: Physicist, medicalull-time student  Social Needs  . Financial resource strain: Not on file  . Food insecurity:    Worry: Not on file    Inability: Not on file  . Transportation needs:    Medical: Not on file    Non-medical: Not on file  Tobacco Use  . Smoking status: Never Smoker  . Smokeless tobacco: Never Used  Substance and Sexual Activity  . Alcohol use: No    Alcohol/week: 0.0 standard drinks  . Drug use: No  . Sexual activity: Never  Lifestyle  . Physical activity:    Days per week: Not on file    Minutes per session: Not on file  . Stress: Not on file  Relationships  . Social connections:    Talks on phone: Not on file    Gets together: Not on file    Attends religious service: Not on file    Active member of club or organization: Not on file    Attends meetings of clubs or organizations: Not on file    Relationship status: Not on file  . Intimate partner violence:    Fear of current or ex partner: Not on file    Emotionally abused: Not on file    Physically abused: Not on file    Forced sexual activity: Not on file  Other Topics Concern  . Not on file  Social History Narrative   Lives at home with mother   Caffeine use: none    Family History  Problem Relation Age of Onset  . Diabetes Mother   . Hypertension Mother   . Stroke Mother   . Hypertension Brother   . Hyperlipidemia Maternal Grandmother   . Seizures Neg Hx     Past Surgical History:  Procedure Laterality Date  . CARDIAC ELECTROPHYSIOLOGY STUDY AND ABLATION  2011   [facility]- left posterior pathway ablation for WPW  . LOOP RECORDER IMPLANT   01/22/2014   MDT LINQ [facility] for evaluation of palpitations/syncope  . TILT TABLE STUDY  2011   neurally mediated syncope    ROS: Review of Systems Negative except as above. PHYSICAL EXAM: BP 119/76   Pulse 83   Temp 98.3 F (36.8 C) (Oral)   Resp 16   Ht 5\' 3"  (1.6 m)   Wt 149 lb 9.6 oz (67.9 kg)   SpO2 100%   BMI 26.50 kg/m   Wt Readings from Last 3 Encounters:  04/20/18 149 lb  9.6 oz (67.9 kg)  12/27/17 146 lb (66.2 kg)  12/15/17 145 lb 3.2 oz (65.9 kg)    Physical Exam  General appearance - alert, well appearing, and in no distress Mental status - normal mood, behavior, speech, dress, motor activity, and thought processes Chest - clear to auscultation, no wheezes, rales or rhonchi, symmetric air entry Heart - normal rate, regular rhythm, normal S1, S2, no murmurs, rubs, clicks or gallops Extremities - peripheral pulses normal, no pedal edema, no clubbing or cyanosis   ASSESSMENT AND PLAN:  1. Menorrhagia with regular cycle Continue iron supplement.  Keep follow-up appointment with gynecology - CBC  2. POTS (postural orthostatic tachycardia syndrome) 3. Palpitations Advised to call her cardiologist and try to schedule another follow-up appointment given that she feels the palpitations have increased and she has had a recent syncope Advised to follow-up if she has another episode where her extremities turn blue or become swollen - Comprehensive metabolic panel  4. Iron deficiency anemia due to chronic blood loss - CBC  5. Other constipation - polyethylene glycol powder (GLYCOLAX/MIRALAX) powder; Take 17 g by mouth daily as needed.  Dispense: 3350 g; Refill: 3   Patient was given the opportunity to ask questions.  Patient verbalized understanding of the plan and was able to repeat key elements of the plan.   Orders Placed This Encounter  Procedures  . CBC  . Comprehensive metabolic panel     Requested Prescriptions   Signed  Prescriptions Disp Refills  . polyethylene glycol powder (GLYCOLAX/MIRALAX) powder 3350 g 3    Sig: Take 17 g by mouth daily as needed.    Return in about 3 months (around 07/20/2018).  Jonah Blue, MD, FACP

## 2018-04-20 NOTE — Patient Instructions (Addendum)
Please try to get back in with your cardiologist as soon as possible. Keep your upcoming appointment with the gynecologist.  Fall Prevention in the Home, Adult Falls can cause injuries. They can happen to people of all ages. There are many things you can do to make your home safe and to help prevent falls. Ask for help when making these changes, if needed. What actions can I take to prevent falls? General Instructions  Use good lighting in all rooms. Replace any light bulbs that burn out.  Turn on the lights when you go into a dark area. Use night-lights.  Keep items that you use often in easy-to-reach places. Lower the shelves around your home if necessary.  Set up your furniture so you have a clear path. Avoid moving your furniture around.  Do not have throw rugs and other things on the floor that can make you trip.  Avoid walking on wet floors.  If any of your floors are uneven, fix them.  Add color or contrast paint or tape to clearly mark and help you see: ? Any grab bars or handrails. ? First and last steps of stairways. ? Where the edge of each step is.  If you use a stepladder: ? Make sure that it is fully opened. Do not climb a closed stepladder. ? Make sure that both sides of the stepladder are locked into place. ? Ask someone to hold the stepladder for you while you use it.  If there are any pets around you, be aware of where they are. What can I do in the bathroom?      Keep the floor dry. Clean up any water that spills onto the floor as soon as it happens.  Remove soap buildup in the tub or shower regularly.  Use non-skid mats or decals on the floor of the tub or shower.  Attach bath mats securely with double-sided, non-slip rug tape.  If you need to sit down in the shower, use a plastic, non-slip stool.  Install grab bars by the toilet and in the tub and shower. Do not use towel bars as grab bars. What can I do in the bedroom?  Make sure that you have  a light by your bed that is easy to reach.  Do not use any sheets or blankets that are too big for your bed. They should not hang down onto the floor.  Have a firm chair that has side arms. You can use this for support while you get dressed. What can I do in the kitchen?  Clean up any spills right away.  If you need to reach something above you, use a strong step stool that has a grab bar.  Keep electrical cords out of the way.  Do not use floor polish or wax that makes floors slippery. If you must use wax, use non-skid floor wax. What can I do with my stairs?  Do not leave any items on the stairs.  Make sure that you have a light switch at the top of the stairs and the bottom of the stairs. If you do not have them, ask someone to add them for you.  Make sure that there are handrails on both sides of the stairs, and use them. Fix handrails that are broken or loose. Make sure that handrails are as long as the stairways.  Install non-slip stair treads on all stairs in your home.  Avoid having throw rugs at the top or bottom of the  stairs. If you do have throw rugs, attach them to the floor with carpet tape.  Choose a carpet that does not hide the edge of the steps on the stairway.  Check any carpeting to make sure that it is firmly attached to the stairs. Fix any carpet that is loose or worn. What can I do on the outside of my home?  Use bright outdoor lighting.  Regularly fix the edges of walkways and driveways and fix any cracks.  Remove anything that might make you trip as you walk through a door, such as a raised step or threshold.  Trim any bushes or trees on the path to your home.  Regularly check to see if handrails are loose or broken. Make sure that both sides of any steps have handrails.  Install guardrails along the edges of any raised decks and porches.  Clear walking paths of anything that might make someone trip, such as tools or rocks.  Have any leaves, snow,  or ice cleared regularly.  Use sand or salt on walking paths during winter.  Clean up any spills in your garage right away. This includes grease or oil spills. What other actions can I take?  Wear shoes that: ? Have a low heel. Do not wear high heels. ? Have rubber bottoms. ? Are comfortable and fit you well. ? Are closed at the toe. Do not wear open-toe sandals.  Use tools that help you move around (mobility aids) if they are needed. These include: ? Canes. ? Walkers. ? Scooters. ? Crutches.  Review your medicines with your doctor. Some medicines can make you feel dizzy. This can increase your chance of falling. Ask your doctor what other things you can do to help prevent falls. Where to find more information  Centers for Disease Control and Prevention, STEAD[employer]: HealthcareCounselor.com.pt  [employer] on Aging: RingConnections.si Contact a doctor if:  You are afraid of falling at home.  You feel weak, drowsy, or dizzy at home.  You fall at home. Summary  There are many simple things that you can do to make your home safe and to help prevent falls.  Ways to make your home safe include removing tripping hazards and installing grab bars in the bathroom.  Ask for help when making these changes in your home. This information is not intended to replace advice given to you by your health care provider. Make sure you discuss any questions you have with your health care provider. Document Released: 01/15/2009 Document Revised: 11/03/2016 Document Reviewed: 11/03/2016 Elsevier Interactive Patient Education  2019 [employer].

## 2018-04-21 LAB — COMPREHENSIVE METABOLIC PANEL
ALBUMIN: 4.7 g/dL (ref 3.5–5.5)
ALK PHOS: 60 IU/L (ref 39–117)
ALT: 9 IU/L (ref 0–32)
AST: 14 IU/L (ref 0–40)
Albumin/Globulin Ratio: 2 (ref 1.2–2.2)
BUN / CREAT RATIO: 14 (ref 9–23)
BUN: 11 mg/dL (ref 6–20)
Bilirubin Total: 0.2 mg/dL (ref 0.0–1.2)
CO2: 24 mmol/L (ref 20–29)
Calcium: 9.4 mg/dL (ref 8.7–10.2)
Chloride: 104 mmol/L (ref 96–106)
Creatinine, Ser: 0.78 mg/dL (ref 0.57–1.00)
GFR calc Af Amer: 120 mL/min/{1.73_m2} (ref >59)
GFR calc non Af Amer: 105 mL/min/{1.73_m2} (ref >59)
GLUCOSE: 80 mg/dL (ref 65–99)
Globulin, Total: 2.4 g/dL (ref 1.5–4.5)
Potassium: 4.8 mmol/L (ref 3.5–5.2)
Sodium: 142 mmol/L (ref 134–144)
Total Protein: 7.1 g/dL (ref 6.0–8.5)

## 2018-04-21 LAB — CBC
HEMOGLOBIN: 11.5 g/dL (ref 11.1–15.9)
Hematocrit: 34 % (ref 34.0–46.6)
MCH: 28.8 pg (ref 26.6–33.0)
MCHC: 33.8 g/dL (ref 31.5–35.7)
MCV: 85 fL (ref 79–97)
PLATELETS: 255 10*3/uL (ref 150–450)
RBC: 4 x10E6/uL (ref 3.77–5.28)
RDW: 11.5 % — ABNORMAL LOW (ref 11.7–15.4)
WBC: 6.4 10*3/uL (ref 3.4–10.8)

## 2018-05-04 ENCOUNTER — Encounter: Payer: Medicaid Other | Admitting: Family Medicine

## 2018-05-09 ENCOUNTER — Encounter: Payer: Self-pay | Admitting: Internal Medicine

## 2018-05-09 NOTE — Telephone Encounter (Signed)
Mychart concern

## 2018-05-10 ENCOUNTER — Encounter: Payer: Medicaid Other | Admitting: Obstetrics & Gynecology

## 2018-05-23 ENCOUNTER — Encounter: Payer: Self-pay | Admitting: Internal Medicine

## 2018-05-31 MED FILL — POLYETHYLENE GLYCOL 3350 PO: 17 | 28 days supply | Qty: 476 | Fill #1

## 2018-07-20 ENCOUNTER — Other Ambulatory Visit: Payer: Self-pay

## 2018-07-20 ENCOUNTER — Ambulatory Visit: Payer: Medicaid Other | Attending: Internal Medicine | Admitting: Internal Medicine

## 2018-07-20 DIAGNOSIS — I951 Orthostatic hypotension: Secondary | ICD-10-CM | POA: Diagnosis not present

## 2018-07-20 DIAGNOSIS — K5909 Other constipation: Secondary | ICD-10-CM

## 2018-07-20 DIAGNOSIS — N920 Excessive and frequent menstruation with regular cycle: Secondary | ICD-10-CM | POA: Diagnosis not present

## 2018-07-20 DIAGNOSIS — G90A Postural orthostatic tachycardia syndrome (POTS): Secondary | ICD-10-CM

## 2018-07-20 DIAGNOSIS — R000 Tachycardia, unspecified: Secondary | ICD-10-CM

## 2018-07-20 MED ORDER — POLYETHYLENE GLYCOL 3350 17 GM/SCOOP PO POWD: 17.0000 g | Freq: Every day | ORAL | 3 refills | Status: AC | PRN

## 2018-07-20 MED FILL — POLYETHYLENE GLYCOL 3350 PO: 17 | 28 days supply | Qty: 476 | Fill #2

## 2018-07-20 NOTE — Progress Notes (Signed)
Virtual Visit via Telephone Note  I connected with Desiree Gutierrez on 07/20/18 at 2:51 p.m by telephone  from my office and verified that I am speaking with the correct person using two identifiers.  Patient is at home.  Only the patient and myself participated in this telephone encounter.   I discussed the limitations, risks, security and privacy concerns of performing an evaluation and management service by telephone and the availability of in person appointments. I also discussed with the patient that there may be a patient responsible charge related to this service. The patient expressed understanding and agreed to proceed.   History of Present Illness: Pt with hx of POTS syndrome, WPW s/p ablation 2011, constipation, IDA. Followed by cardiology at [facility]. Last seen 04/2018.  Menorrhagia:  Had to cancel GYN appt due to weather at the time.  She has called back 3 x trying to reschedule without success.  POTS/Palpitations/Syncope: she did see her cardiologist 05/31/2018.  Pt states she was basically told to monitor her symptoms and know her limitations.  They recommend wearing compression socks.  Zio patch monitor to be ordered in future is available to her -had one episode of palpitations that woke her from sleep around 3 a.m about 2 wks ago.  The palpitations lasted about 30 minutes and was associated with cold sweats and some diarrhea.  No further episodes.   Observations/Objective: No direct observations done as this was a telephone encounter.  Assessment and Plan: 1. Menorrhagia with regular cycle We will resubmit referral to gynecology for her. - Ambulatory referral to Gynecology  2. Other constipation Patient requesting refill on MiraLAX. - polyethylene glycol powder (GLYCOLAX/MIRALAX) 17 GM/SCOOP powder; Take 17 g by mouth daily as needed.  Dispense: 3350 g; Refill: 3  3. POTS (postural orthostatic tachycardia syndrome) Followed by cardiology at [facility]. Patient will let me  know if she has any recurrent episode of complication associated with sweating and diarrhea    Follow Up Instructions: Follow-up in 3 months   I discussed the assessment and treatment plan with the patient. The patient was provided an opportunity to ask questions and all were answered. The patient agreed with the plan and demonstrated an understanding of the instructions.   The patient was advised to call back or seek an in-person evaluation if the symptoms worsen or if the condition fails to improve as anticipated.  I provided 16 minutes of non-face-to-face time during this encounter.   Jonah Blue, MD

## 2018-10-18 ENCOUNTER — Encounter: Payer: Self-pay | Admitting: Internal Medicine

## 2018-10-18 ENCOUNTER — Other Ambulatory Visit: Payer: Self-pay

## 2018-10-18 ENCOUNTER — Ambulatory Visit: Payer: Medicaid Other | Attending: Internal Medicine | Admitting: Internal Medicine

## 2018-10-18 DIAGNOSIS — N920 Excessive and frequent menstruation with regular cycle: Secondary | ICD-10-CM | POA: Diagnosis not present

## 2018-10-18 DIAGNOSIS — G90A Postural orthostatic tachycardia syndrome (POTS): Secondary | ICD-10-CM

## 2018-10-18 DIAGNOSIS — D500 Iron deficiency anemia secondary to blood loss (chronic): Secondary | ICD-10-CM | POA: Diagnosis not present

## 2018-10-18 DIAGNOSIS — N946 Dysmenorrhea, unspecified: Secondary | ICD-10-CM | POA: Diagnosis not present

## 2018-10-18 DIAGNOSIS — R000 Tachycardia, unspecified: Secondary | ICD-10-CM

## 2018-10-18 DIAGNOSIS — I951 Orthostatic hypotension: Secondary | ICD-10-CM

## 2018-10-18 MED ORDER — IBUPROFEN 600 MG PO TABS: 600.0000 mg | ORAL_TABLET | Freq: Three times a day (TID) | ORAL | 2 refills | Status: AC

## 2018-10-18 MED FILL — IBUPROFEN 600 MG TABLET: 600 | 10 days supply | Qty: 60 | Fill #0

## 2018-10-18 NOTE — Progress Notes (Signed)
Virtual Visit via Telephone Note Due to current restrictions/limitations of in-office visits due to the COVID-19 pandemic, this scheduled clinical appointment was converted to a telehealth visit  I connected with Desiree Gutierrez on 10/18/18 at 3:54 p.m by telephone and verified that I am speaking with the correct person using two identifiers. I am in my office.  The patient is at home.  Only the patient and myself participated in this encounter.  I discussed the limitations, risks, security and privacy concerns of performing an evaluation and management service by telephone and the availability of in person appointments. I also discussed with the patient that there may be a patient responsible charge related to this service. The patient expressed understanding and agreed to proceed.   History of Present Illness: Pt with hx of POTS syndrome, WPW s/p ablation 2011, constipation, IDA. Follow[facility]ology at [facility].   Menorrhagia:  Was not called with GYN appt as yet Not taking iron due to constipation.  Taking a supplement by [employer] called [employer] to help keep her blood count up. -Cycles are heavy during the second through the fifth day and tapers off by the sixth day.  She has positive clots during her cycle.  She takes ibuprofen as needed for the pain.  POTS:  Has appt with cardiology next wk Felt more tired and drained for the past few wks.  Some palpitations at times.  Also gets sensation of electrical sock that shoots across LT chest and goes into LT arm.  Painful when it happens followed by feeling of exhaustion.    Outpatient Encounter Medications as of 10/18/2018  Medication Sig Note  . Ascorbic Acid (VITAMIN C) 1000 MG tablet Take 1,000 mg by mouth daily.   . Calcium-Magnesium-Vitamin D (CALCIUM MAGNESIUM PO) Take 2 tablets by mouth daily. 12/17/2014: .   Marland Kitchen. Cholecalciferol (D-5000) 5000 UNITS TABS Take 5,000 Units by mouth daily. 12/17/2014: .   Marland Kitchen. Coenzyme Q10 (COQ10 PO) Take 1  tablet by mouth daily.   . Cyanocobalamin (RA VITAMIN B-12 TR) 1000 MCG TBCR Take 1,000 mcg by mouth daily. 12/17/2014: .   Marland Kitchen. D-Ribose (RIBOSE, D,) POWD Take 200 g by mouth daily.   . ferrous gluconate (FERGON) 324 MG tablet Take 1 tablet (324 mg total) by mouth daily with breakfast. (Patient not taking: Reported on 01/05/2017)   . ibuprofen (ADVIL,MOTRIN) 600 MG tablet Take 1 tablet (600 mg total) by mouth 3 (three) times daily. X 10 days then prn pain   . Multiple Vitamin (MULTIVITAMIN WITH MINERALS) TABS tablet Take 1 tablet by mouth daily.   . Omega-3 1000 MG CAPS Take 1 capsule by mouth daily.   . polyethylene glycol powder (GLYCOLAX/MIRALAX) 17 GM/SCOOP powder Take 17 g by mouth daily as needed.   . Probiotic Product (PROBIOTIC PO) Take 1 tablet by mouth daily. 12/17/2014: .   . sodium chloride 1 G tablet Take 1 g by mouth 2 (two) times daily. 12/17/2014: .    No facility-administered encounter medications on file as of 10/18/2018.     Observations/Objective:   Assessment and Plan: 1. Menorrhagia with regular cycle Message sent to our referral coordinator asking her to look into the GYN referral  2. Dysmenorrhea - ibuprofen (ADVIL) 600 MG tablet; Take 1 tablet (600 mg total) by mouth 3 (three) times daily. X 10 days then prn pain  Dispense: 60 tablet; Refill: 2  3. POTS (postural orthostatic tachycardia syndrome) Patient to keep appointment with the cardiologist next week.  I  advised her to inform him about the symptoms she has been having  4. Iron deficiency anemia due to chronic blood loss She will come to the lab to have blood count done to see if her hemoglobin has remained stable - CBC; Future   Follow Up Instructions: F/u in 4 mths   I discussed the assessment and treatment plan with the patient. The patient was provided an opportunity to ask questions and all were answered. The patient agreed with the plan and demonstrated an understanding of the instructions.   The  patient was advised to call back or seek an in-person evaluation if the symptoms worsen or if the condition fails to improve as anticipated.  I provided 10 minutes of non-face-to-face time during this encounter.   Karle Plumber, MD

## 2018-10-19 NOTE — Addendum Note (Signed)
Addended by: Karle Plumber B on: 10/19/2018 05:07 PM   Modules accepted: Orders

## 2018-11-13 ENCOUNTER — Encounter: Payer: Self-pay | Admitting: Internal Medicine

## 2018-11-30 ENCOUNTER — Encounter: Payer: Medicaid Other | Admitting: Obstetrics & Gynecology

## 2018-12-24 ENCOUNTER — Ambulatory Visit (INDEPENDENT_AMBULATORY_CARE_PROVIDER_SITE_OTHER): Payer: Medicaid Other | Admitting: Family Medicine

## 2018-12-24 ENCOUNTER — Other Ambulatory Visit: Payer: Self-pay

## 2018-12-24 ENCOUNTER — Encounter: Payer: Self-pay | Admitting: Family Medicine

## 2018-12-24 VITALS — BP 120/80 | HR 85 | Temp 99.1°F | Wt 140.5 lb

## 2018-12-24 DIAGNOSIS — N920 Excessive and frequent menstruation with regular cycle: Secondary | ICD-10-CM | POA: Diagnosis not present

## 2018-12-24 DIAGNOSIS — N946 Dysmenorrhea, unspecified: Secondary | ICD-10-CM | POA: Diagnosis not present

## 2018-12-24 MED ORDER — FAMOTIDINE 20 MG PO TABS: 20.0000 mg | ORAL_TABLET | Freq: Two times a day (BID) | ORAL | 6 refills | Status: AC

## 2018-12-24 NOTE — Progress Notes (Signed)
   Subjective:    Patient ID: Desiree Gutierrez, female    DOB: 1990-09-05, 28 y.o.   MRN: [id number]  HPI Patient seen for menorrhagia with regular cycle and dysmenorrhea. Has always had heavy periods. Last for 5-7 days, first few days very heavy, using saturated pad every 1-2 hours. Pain severe. Has been using scheduled ibuprofen for the past 2 cycles, which has helped to decrease her bleeding.   I have reviewed the patients past medical, family, and social history.  I have reviewed the patient's medication list and allergies.    Review of Systems     Objective:   Physical Exam Constitutional:      Appearance: Normal appearance.  Neck:     Musculoskeletal: Normal range of motion.  Cardiovascular:     Rate and Rhythm: Normal rate.     Pulses: Normal pulses.  Pulmonary:     Effort: Pulmonary effort is normal.  Abdominal:     General: Abdomen is flat. Bowel sounds are normal. There is no distension.     Palpations: There is no mass.     Tenderness: There is no abdominal tenderness. There is no guarding or rebound.     Hernia: No hernia is present.  Neurological:     Mental Status: She is alert.       Assessment & Plan:  1. Menorrhagia with regular cycle Discussed OCPs vs IUD vs continuing with NSAIDs. She prefers to continue with NSAIDs. Will give H2 blocker to help prevent GI upset. F/u in 3 months to see if still having adequate control  2. Dysmenorrhea

## 2019-04-01 ENCOUNTER — Encounter: Payer: Self-pay | Admitting: Internal Medicine

## 2019-04-28 ENCOUNTER — Encounter: Payer: Self-pay | Admitting: Internal Medicine

## 2019-05-08 ENCOUNTER — Encounter: Payer: Self-pay | Admitting: Internal Medicine

## 2019-05-09 ENCOUNTER — Ambulatory Visit: Payer: Medicaid Other | Attending: Internal Medicine | Admitting: Internal Medicine

## 2019-05-09 ENCOUNTER — Other Ambulatory Visit: Payer: Self-pay

## 2019-05-09 DIAGNOSIS — R21 Rash and other nonspecific skin eruption: Secondary | ICD-10-CM

## 2019-05-09 MED ORDER — TRIAMCINOLONE ACETONIDE 0.1 % EX CREA: 1.0000 "application " | TOPICAL_CREAM | Freq: Two times a day (BID) | CUTANEOUS | 0 refills | Status: DC

## 2019-05-09 MED FILL — TRIAMCINOLONE ACETONIDE 0.1: 0.1 | 30 days supply | Qty: 30 | Fill #0

## 2019-05-09 NOTE — Progress Notes (Signed)
Virtual Visit via Telephone Note Due to current restrictions/limitations of in-office visits due to the COVID-19 pandemic, this scheduled clinical appointment was converted to a telehealth visit  I connected with Desiree Gutierrez on 05/09/19 at 4:11 p.m by telephone and verified that I am speaking with the correct person using two identifiers. I am in my office.  The patient is at home.  Only the patient and myself participated in this encounter.  I discussed the limitations, risks, security and privacy concerns of performing an evaluation and management service by telephone and the availability of in person appointments. I also discussed with the patient that there may be a patient responsible charge related to this service. The patient expressed understanding and agreed to proceed.   History of Present Illness: Pt with hx of POTS syndrome, WPW s/p ablation 2011, constipation, IDA.    C/o rash around neck x 1 day.  Pt sent me a Mychart message with pic It is itchy, burning and irritating.  No vesicles seen. Feels rough to the touch.  No change in body product.  Felt like her shirt was rubbing against her neck and was burning.  Did not use perfume yesterday or today.  Tried using some hydrocortisone cream but she felt it burn more..  Observations/Objective: Pic from mychart    Assessment and Plan: 1. Rash Etiology unknown. Advised patient to let me know if she develops any vesicles that would suggest shingles.  In the meantime I have prescribed some triamcinolone cream for her to use - triamcinolone cream (KENALOG) 0.1 %; Apply 1 application topically 2 (two) times daily.  Dispense: 30 g; Refill: 0   Follow Up Instructions: As needed   I discussed the assessment and treatment plan with the patient. The patient was provided an opportunity to ask questions and all were answered. The patient agreed with the plan and demonstrated an understanding of the instructions.   The patient was  advised to call back or seek an in-person evaluation if the symptoms worsen or if the condition fails to improve as anticipated.  I provided 6 minutes of non-face-to-face time during this encounter.   Jonah Blue, MD

## 2019-05-09 NOTE — Progress Notes (Signed)
Pt states she is having pain where the rash is

## 2019-05-13 ENCOUNTER — Other Ambulatory Visit: Payer: Self-pay

## 2019-05-13 ENCOUNTER — Ambulatory Visit: Payer: Medicaid Other | Attending: Internal Medicine

## 2019-05-13 DIAGNOSIS — D500 Iron deficiency anemia secondary to blood loss (chronic): Secondary | ICD-10-CM

## 2019-05-14 LAB — CBC
Hematocrit: 35.1 % (ref 34.0–46.6)
Hemoglobin: 11.6 g/dL (ref 11.1–15.9)
MCH: 28.6 pg (ref 26.6–33.0)
MCHC: 33 g/dL (ref 31.5–35.7)
MCV: 87 fL (ref 79–97)
Platelets: 244 10*3/uL (ref 150–450)
RBC: 4.05 x10E6/uL (ref 3.77–5.28)
RDW: 11.3 % — ABNORMAL LOW (ref 11.7–15.4)
WBC: 6.4 10*3/uL (ref 3.4–10.8)

## 2019-05-15 ENCOUNTER — Encounter: Payer: Self-pay | Admitting: Internal Medicine

## 2019-05-15 DIAGNOSIS — R002 Palpitations: Secondary | ICD-10-CM

## 2019-05-16 ENCOUNTER — Ambulatory Visit: Payer: Medicaid Other | Attending: Internal Medicine

## 2019-05-16 ENCOUNTER — Other Ambulatory Visit: Payer: Self-pay

## 2019-05-16 DIAGNOSIS — R002 Palpitations: Secondary | ICD-10-CM

## 2019-05-17 LAB — BASIC METABOLIC PANEL
BUN/Creatinine Ratio: 16 (ref 9–23)
BUN: 12 mg/dL (ref 6–20)
CO2: 24 mmol/L (ref 20–29)
Calcium: 9.5 mg/dL (ref 8.7–10.2)
Chloride: 101 mmol/L (ref 96–106)
Creatinine, Ser: 0.74 mg/dL (ref 0.57–1.00)
GFR calc Af Amer: 127 mL/min/{1.73_m2} (ref >59)
GFR calc non Af Amer: 111 mL/min/{1.73_m2} (ref >59)
Glucose: 73 mg/dL (ref 65–99)
Potassium: 4.6 mmol/L (ref 3.5–5.2)
Sodium: 142 mmol/L (ref 134–144)

## 2019-05-17 LAB — MAGNESIUM: Magnesium: 2 mg/dL (ref 1.6–2.3)

## 2019-05-23 ENCOUNTER — Ambulatory Visit: Payer: Medicaid Other | Admitting: Family Medicine

## 2019-05-27 ENCOUNTER — Encounter: Payer: Self-pay | Admitting: Internal Medicine

## 2019-06-21 ENCOUNTER — Other Ambulatory Visit (HOSPITAL_COMMUNITY)
Admission: RE | Admit: 2019-06-21 | Discharge: 2019-06-21 | Disposition: A | Discharge status: Home / Self Care | Payer: Medicaid Other | Source: Ambulatory Visit | Attending: Family Medicine | Admitting: Family Medicine

## 2019-06-21 ENCOUNTER — Ambulatory Visit (INDEPENDENT_AMBULATORY_CARE_PROVIDER_SITE_OTHER): Payer: Medicaid Other | Admitting: Family Medicine

## 2019-06-21 ENCOUNTER — Encounter: Payer: Self-pay | Admitting: Family Medicine

## 2019-06-21 ENCOUNTER — Other Ambulatory Visit: Payer: Self-pay

## 2019-06-21 VITALS — BP 116/81 | HR 92 | Wt 138.8 lb

## 2019-06-21 DIAGNOSIS — N946 Dysmenorrhea, unspecified: Secondary | ICD-10-CM

## 2019-06-21 DIAGNOSIS — Z01419 Encounter for gynecological examination (general) (routine) without abnormal findings: Secondary | ICD-10-CM | POA: Diagnosis present

## 2019-06-21 DIAGNOSIS — N920 Excessive and frequent menstruation with regular cycle: Secondary | ICD-10-CM

## 2019-06-21 MED ORDER — TRANEXAMIC ACID 650 MG PO TABS: 1300.0000 mg | ORAL_TABLET | Freq: Three times a day (TID) | ORAL | 2 refills | Status: AC

## 2019-06-21 MED FILL — TRANEXAMIC ACID 650 MG TAB: 650 | 5 days supply | Qty: 30 | Fill #0

## 2019-06-21 NOTE — Progress Notes (Signed)
   Subjective:    Patient ID: Desiree Gutierrez, female    DOB: 1990/09/05, 29 y.o.   MRN: [id number]  HPI Patient seen for follow-up of menorrhagia.  She has been using scheduled NSAIDs for her heavy bleeding.  Overall, she finds that this has helped.  She is also started taking a woman's probiotic for the past 2 months and noticed that her last echo was much improved.  The cycle before that it was fairly heavy and with a lot of cramping and bleeding.  She still does not want to take any hormonal medications to control the bleeding.  She is not sexually active   Review of Systems     Objective:   Physical Exam Vitals reviewed. Exam conducted with a chaperone present.  Constitutional:      Appearance: Normal appearance.  Abdominal:     Hernia: There is no hernia in the left inguinal area or right inguinal area.  Genitourinary:    Labia:        Right: No rash, tenderness, lesion or injury.        Left: No rash, tenderness, lesion or injury.      Urethra: No prolapse.     Vagina: No signs of injury and foreign body. No vaginal discharge, erythema, tenderness or bleeding.     Cervix: Normal. No eversion.  Lymphadenopathy:     Lower Body: No right inguinal adenopathy. No left inguinal adenopathy.  Neurological:     Mental Status: She is alert.       Assessment & Plan:  1. Women's annual routine gynecological examination PAP done.  - Cytology - PAP( [facility])  2. Menorrhagia with regular cycle Discussed lysteda - patient would like to trial this. TID x 5 days. F/u in 3 months.  3. Dysmenorrhea

## 2019-06-24 LAB — CYTOLOGY - PAP
Chlamydia: NEGATIVE
Comment: NEGATIVE
Comment: NEGATIVE
Comment: NORMAL
Diagnosis: NEGATIVE
High risk HPV: NEGATIVE
Neisseria Gonorrhea: NEGATIVE

## 2019-08-30 MED FILL — NIFEDIPINE ER OSMOTIC RELEA: 30 | 30 days supply | Qty: 30 | Fill #0

## 2020-07-27 ENCOUNTER — Ambulatory Visit: Payer: Medicaid Other | Admitting: Internal Medicine

## 2020-08-06 ENCOUNTER — Ambulatory Visit: Payer: Medicaid Other | Admitting: Internal Medicine

## 2020-11-27 ENCOUNTER — Encounter: Payer: Self-pay | Admitting: Internal Medicine

## 2020-11-30 ENCOUNTER — Ambulatory Visit: Payer: Medicaid Other | Attending: Internal Medicine | Admitting: Internal Medicine

## 2020-11-30 ENCOUNTER — Other Ambulatory Visit: Payer: Self-pay

## 2020-11-30 ENCOUNTER — Encounter: Payer: Self-pay | Admitting: Internal Medicine

## 2020-11-30 DIAGNOSIS — R002 Palpitations: Secondary | ICD-10-CM

## 2020-11-30 DIAGNOSIS — D500 Iron deficiency anemia secondary to blood loss (chronic): Secondary | ICD-10-CM

## 2020-11-30 DIAGNOSIS — Z1159 Encounter for screening for other viral diseases: Secondary | ICD-10-CM | POA: Diagnosis not present

## 2020-11-30 DIAGNOSIS — Z2821 Immunization not carried out because of patient refusal: Secondary | ICD-10-CM

## 2020-11-30 NOTE — Progress Notes (Signed)
Patient ID: Desiree Gutierrez, female   DOB: [birth year], 30 y.o.   MRN: [id number] Virtual Visit via Telephone Note  I connected with Desiree Gutierrez on 11/30/2020 at 2:53 p.m by telephone and verified that I am speaking with the correct person using two identifiers  Location: Patient: home Provider: office  Participants: Myself Patient   I discussed the limitations, risks, security and privacy concerns of performing an evaluation and management service by telephone and the availability of in person appointments. I also discussed with the patient that there may be a patient responsible charge related to this service. The patient expressed understanding and agreed to proceed.   History of Present Illness: Pt with hx of POTS syndrome, WPW s/p ablation 2011, constipation, IDA.  Last evaluated 05/2019.  Pt requesting blood tests done Saw her cardiologist recently.  Has some increase palpitations.  Zio patch ordered to be worn for 14 days.  Pt wants to have labs done to make sure nothing is off contributing to the palpitations.   Menses are less heavy but still last 7 days.  Saw GYN last 06/2019  HM:  wants hep C screening.  Declines flu shot.  Outpatient Encounter Medications as of 11/30/2020  Medication Sig Note   Ascorbic Acid (VITAMIN C) 1000 MG tablet Take 1,000 mg by mouth daily.    Calcium-Magnesium-Vitamin D (CALCIUM MAGNESIUM PO) Take 2 tablets by mouth daily. 12/17/2014: .    Cholecalciferol (D-5000) 5000 UNITS TABS Take 5,000 Units by mouth daily. 12/17/2014: .    Coenzyme Q10 (COQ10 PO) Take 1 tablet by mouth daily.    Cyanocobalamin (RA VITAMIN B-12 TR) 1000 MCG TBCR Take 1,000 mcg by mouth daily. 12/17/2014: .    D-Ribose (RIBOSE, D,) POWD Take 200 g by mouth daily.    famotidine (PEPCID) 20 MG tablet Take 1 tablet (20 mg total) by mouth 2 (two) times daily. (Patient not taking: Reported on 05/09/2019)    ibuprofen (ADVIL) 600 MG tablet Take 1 tablet (600 mg total) by mouth 3 (three)  times daily. X 10 days then prn pain (Patient not taking: Reported on 05/09/2019)    Multiple Vitamin (MULTIVITAMIN WITH MINERALS) TABS tablet Take 1 tablet by mouth daily.    Omega-3 1000 MG CAPS Take 1 capsule by mouth daily.    polyethylene glycol powder (GLYCOLAX/MIRALAX) 17 GM/SCOOP powder Take 17 g by mouth daily as needed. (Patient not taking: Reported on 05/09/2019)    Probiotic Product (PROBIOTIC PO) Take 1 tablet by mouth daily. 12/17/2014: .    sodium chloride 1 G tablet Take 1 g by mouth 2 (two) times daily. 12/17/2014: .    tranexamic acid (LYSTEDA) 650 MG TABS tablet Take 2 tablets (1,300 mg total) by mouth 3 (three) times daily. Take during menses for a maximum of five days    triamcinolone cream (KENALOG) 0.1 % Apply 1 application topically 2 (two) times daily. (Patient not taking: Reported on 06/21/2019)    No facility-administered encounter medications on file as of 11/30/2020.      Observations/Objective: No direct observation done as this was a telephone encounter.  Assessment and Plan: 1. Palpitations Patient with known history of pots syndrome.  She will come to the lab to have her blood test done. - Comprehensive metabolic panel; Future - TSH; Future  2. Iron deficiency anemia due to chronic blood loss - CBC; Future  3. Need for hepatitis C screening test - Hepatitis C Antibody; Future  4. Influenza vaccination declined Recommended.  Patient declined.  Follow Up Instructions: PRN   I discussed the assessment and treatment plan with the patient. The patient was provided an opportunity to ask questions and all were answered. The patient agreed with the plan and demonstrated an understanding of the instructions.   The patient was advised to call back or seek an in-person evaluation if the symptoms worsen or if the condition fails to improve as anticipated.  I  Spent 10 minutes on this telephone encounter  Jonah Blue, MD

## 2020-12-04 NOTE — Addendum Note (Signed)
Addended byMemory Dance on: 12/04/2020 03:22 PM   Modules accepted: Orders

## 2020-12-05 LAB — HEPATITIS C ANTIBODY: Hep C Virus Ab: <0.1 s/co ratio (ref 0.0–0.9)

## 2020-12-05 LAB — COMPREHENSIVE METABOLIC PANEL
ALT: 9 IU/L (ref 0–32)
AST: 18 IU/L (ref 0–40)
Albumin/Globulin Ratio: 2.3 — ABNORMAL HIGH (ref 1.2–2.2)
Albumin: 5 g/dL (ref 3.9–5.0)
Alkaline Phosphatase: 63 IU/L (ref 44–121)
BUN/Creatinine Ratio: 19 (ref 9–23)
BUN: 16 mg/dL (ref 6–20)
Bilirubin Total: 0.2 mg/dL (ref 0.0–1.2)
CO2: 24 mmol/L (ref 20–29)
Calcium: 10 mg/dL (ref 8.7–10.2)
Chloride: 103 mmol/L (ref 96–106)
Creatinine, Ser: 0.84 mg/dL (ref 0.57–1.00)
Globulin, Total: 2.2 g/dL (ref 1.5–4.5)
Glucose: 103 mg/dL — ABNORMAL HIGH (ref 65–99)
Potassium: 4.2 mmol/L (ref 3.5–5.2)
Sodium: 139 mmol/L (ref 134–144)
Total Protein: 7.2 g/dL (ref 6.0–8.5)
eGFR: 96 mL/min/{1.73_m2} (ref >59)

## 2020-12-05 LAB — TSH: TSH: 2.26 u[IU]/mL (ref 0.450–4.500)

## 2020-12-05 LAB — CBC
Hematocrit: 36.1 % (ref 34.0–46.6)
Hemoglobin: 12.1 g/dL (ref 11.1–15.9)
MCH: 28.6 pg (ref 26.6–33.0)
MCHC: 33.5 g/dL (ref 31.5–35.7)
MCV: 85 fL (ref 79–97)
Platelets: 223 10*3/uL (ref 150–450)
RBC: 4.23 x10E6/uL (ref 3.77–5.28)
RDW: 11.5 % — ABNORMAL LOW (ref 11.7–15.4)
WBC: 8.1 10*3/uL (ref 3.4–10.8)

## 2022-04-29 ENCOUNTER — Emergency Department (HOSPITAL_COMMUNITY)
Admission: EM | Admit: 2022-04-29 | Discharge: 2022-04-30 | Disposition: A | Discharge status: Home / Self Care | Payer: Medicaid Other | Attending: Emergency Medicine | Admitting: Emergency Medicine

## 2022-04-29 ENCOUNTER — Ambulatory Visit: Payer: Self-pay | Admitting: *Deleted

## 2022-04-29 DIAGNOSIS — G20A1 Parkinson's disease without dyskinesia, without mention of fluctuations: Secondary | ICD-10-CM | POA: Insufficient documentation

## 2022-04-29 DIAGNOSIS — R42 Dizziness and giddiness: Secondary | ICD-10-CM

## 2022-04-29 DIAGNOSIS — R002 Palpitations: Secondary | ICD-10-CM

## 2022-04-29 LAB — CBC
HCT: 38.9 % (ref 36.0–46.0)
Hemoglobin: 12 g/dL (ref 12.0–15.0)
MCH: 28.6 pg (ref 26.0–34.0)
MCHC: 30.8 g/dL (ref 30.0–36.0)
MCV: 92.8 fL (ref 80.0–100.0)
Platelets: 225 10*3/uL (ref 150–400)
RBC: 4.19 MIL/uL (ref 3.87–5.11)
RDW: 11.7 % (ref 11.5–15.5)
WBC: 6.5 10*3/uL (ref 4.0–10.5)
nRBC: 0 % (ref 0.0–0.2)

## 2022-04-29 LAB — BASIC METABOLIC PANEL
Anion gap: 12 (ref 5–15)
BUN: 10 mg/dL (ref 6–20)
CO2: 25 mmol/L (ref 22–32)
Calcium: 10 mg/dL (ref 8.9–10.3)
Chloride: 101 mmol/L (ref 98–111)
Creatinine, Ser: 0.73 mg/dL (ref 0.44–1.00)
GFR, Estimated: >60 mL/min (ref >60)
Glucose, Bld: 77 mg/dL (ref 70–99)
Potassium: 3.7 mmol/L (ref 3.5–5.1)
Sodium: 138 mmol/L (ref 135–145)

## 2022-04-29 LAB — I-STAT BETA HCG BLOOD, ED (MC, WL, AP ONLY): I-stat hCG, quantitative: <5 m[IU]/mL (ref <5)

## 2022-04-29 NOTE — Telephone Encounter (Signed)
  Chief Complaint: SOB at times Symptoms: SOB, palpitations, dizziness, fatigue Frequency: started a few weeks ago Pertinent Negatives: Patient denies SOB now- but did have earlier Disposition: [x] ED /[] Urgent Care (no appt availability in office) / [] Appointment(In office/virtual)/ []  [facility]/ [] Home Care/ [] Refused Recommended Disposition /[] [facility]/ []  Follow-up with PCP Additional Notes: Advised ED for evaluation

## 2022-04-29 NOTE — Telephone Encounter (Signed)
Follow-up with patient. She had not left for ED as of yet waiting for someone to come to stay with her mother. Verbalized that her s/s are not getting any worsen, but have not improved  Verbalized as soon as someone come to relive her she was headed to ED. Advised patient that if s/s worsen to call 911 and do attempt to drive to ED. Patient voiced understanding of all discussed .

## 2022-04-29 NOTE — ED Provider Triage Note (Signed)
Emergency Medicine Provider Triage Evaluation Note  Desiree Gutierrez , a 32 y.o. female  was evaluated in triage.  Pt complains of dizziness.  Patient reports intermittent dizziness/lightheadedness described as feeling as if she is going to pass out.  Patient reports history of similar symptoms diagnosed with POTS, Wolff-Parkinson-White.  Denies chest pain, shortness of breath, abdominal pain, nausea, vomiting, urinary symptoms, change in bowel habits.  Review of Systems  Positive: See above Negative:   Physical Exam  BP 125/77 (BP Location: Right Arm)   Pulse 72   Temp 98.5 F (36.9 C)   Resp 16   SpO2 100%  Gen:   Awake, no distress   Resp:  Normal effort  MSK:   Moves extremities without difficulty  Other:    Medical Decision Making  Medically screening exam initiated at 7:30 PM.  Appropriate orders placed.  Desiree Gutierrez was informed that the remainder of the evaluation will be completed by another provider, this initial triage assessment does not replace that evaluation, and the importance of remaining in the ED until their evaluation is complete.     [name], [place] 04/29/22 2012

## 2022-04-29 NOTE — Telephone Encounter (Signed)
Summary: sob   Pt complains of a little SOB and her hands turn a little bluish.  This has been going on for a couple of weeks.  Please advise  [phone]         Reason for Disposition  Extra heartbeats, irregular heart beating, or heart is beating very fast  (i.e., "palpitations")  Answer Assessment - Initial Assessment Questions 1. RESPIRATORY STATUS: "Describe your breathing?" (e.g., wheezing, shortness of breath, unable to speak, severe coughing)      When present - patient has SOB- has to sit down and rest, chest palpitations with SOB 2. ONSET: "When did this breathing problem begin?"      Comes and goes - few weeks now 3. PATTERN "Does the difficult breathing come and go, or has it been constant since it started?"      Comes and goes 4. SEVERITY: "How bad is your breathing?" (e.g., mild, moderate, severe)    - MILD: No SOB at rest, mild SOB with walking, speaks normally in sentences, can lie down, no retractions, pulse < 100.    - MODERATE: SOB at rest, SOB with minimal exertion and prefers to sit, cannot lie down flat, speaks in phrases, mild retractions, audible wheezing, pulse 100-120.    - SEVERE: Very SOB at rest, speaks in single words, struggling to breathe, sitting hunched forward, retractions, pulse > 120      Baseline now- was winded today 5. RECURRENT SYMPTOM: "Have you had difficulty breathing before?" If Yes, ask: "When was the last time?" and "What happened that time?"      Yes- heart hx- can have flare 6. CARDIAC HISTORY: "Do you have any history of heart disease?" (e.g., heart attack, angina, bypass surgery, angioplasty)      Yes- Wolfe-Parkinson- White 7. LUNG HISTORY: "Do you have any history of lung disease?"  (e.g., pulmonary embolus, asthma, emphysema)     no 8. CAUSE: "What do you think is causing the breathing problem?"      Anemia or flare of chronic heart disease 9. OTHER SYMPTOMS: "Do you have any other symptoms? (e.g., dizziness, runny nose, cough,  chest pain, fever)     Pain in back/arm, dizziness, weakness  Protocols used: Breathing Difficulty-A-AH

## 2022-04-29 NOTE — ED Triage Notes (Signed)
Patient here for evaluation of intermittent dizziness that started a few weeks ago. Patient states she called her PCP and was instructed to go to the ED. Patient is alert, oriented, and in no apparent distress. Denies pain, denies nausea.

## 2022-04-30 ENCOUNTER — Encounter (HOSPITAL_COMMUNITY): Payer: Self-pay

## 2022-04-30 LAB — MAGNESIUM: Magnesium: 2.3 mg/dL (ref 1.7–2.4)

## 2022-04-30 NOTE — ED Provider Notes (Signed)
[facility] Provider Note   CSN: [id number] Arrival date & time: 04/29/22  1820     History  Chief Complaint  Patient presents with   Dizziness    Desiree Gutierrez is a 32 y.o. female.  32 year old female presents ER today secondary to dizziness.  Patient has a history of POTS and Wolff-Parkinson-White status post ablation in 2011.  She states she symptoms get symptomatic.  She sees Dr. Gelene Mink as a cardiologist but has not seen him in over a year as he is moving offices.  She states over the last couple weeks she has had a few episodes of dizziness associated with palpitations and elevated dyspnea.  No lower extremity swelling Maline.  She is not on any prescription medications but takes iron and other vitamins.  Eating and drinking normally.  No alcohol, drugs, tobacco or over-the-counter medicines otherwise.  No recent illnesses.  No weight loss or weight gain.  Not necessarily associated with position changes.  She talked her primary doctor who suggested she come to the ER for evaluation prior to seeing her.  No syncope or trauma.   Dizziness      Home Medications Prior to Admission medications   Medication Sig Start Date End Date Taking? Authorizing Provider  Ascorbic Acid (VITAMIN C) 1000 MG tablet Take 1,000 mg by mouth daily.    [provider]  Calcium-Magnesium-Vitamin D (CALCIUM MAGNESIUM PO) Take 2 tablets by mouth daily.    [provider]  Cholecalciferol (D-5000) 5000 UNITS TABS Take 5,000 Units by mouth daily.    [provider]  Coenzyme Q10 (COQ10 PO) Take 1 tablet by mouth daily.    [provider]  Cyanocobalamin (RA VITAMIN B-12 TR) 1000 MCG TBCR Take 1,000 mcg by mouth daily.    [provider]  D-Ribose (RIBOSE, D,) POWD Take 200 g by mouth daily.    [provider]  famotidine (PEPCID) 20 MG tablet Take 1 tablet (20 mg total) by mouth 2 (two) times daily. Patient  not taking: Reported on 05/09/2019 12/24/18   Truett Mainland, DO  ibuprofen (ADVIL) 600 MG tablet Take 1 tablet (600 mg total) by mouth 3 (three) times daily. X 10 days then prn pain Patient not taking: Reported on 05/09/2019 10/18/18   Ladell Pier, MD  Multiple Vitamin (MULTIVITAMIN WITH MINERALS) TABS tablet Take 1 tablet by mouth daily.    [provider]  Omega-3 1000 MG CAPS Take 1 capsule by mouth daily.    [provider]  polyethylene glycol powder (GLYCOLAX/MIRALAX) 17 GM/SCOOP powder Take 17 g by mouth daily as needed. Patient not taking: Reported on 05/09/2019 07/20/18   Ladell Pier, MD  Probiotic Product (PROBIOTIC PO) Take 1 tablet by mouth daily.    [provider]  sodium chloride 1 G tablet Take 1 g by mouth 2 (two) times daily.    [provider]  tranexamic acid (LYSTEDA) 650 MG TABS tablet Take 2 tablets (1,300 mg total) by mouth 3 (three) times daily. Take during menses for a maximum of five days 06/21/19   Truett Mainland, DO  triamcinolone cream (KENALOG) 0.1 % Apply 1 application topically 2 (two) times daily. Patient not taking: Reported on 06/21/2019 05/09/19   Ladell Pier, MD      Allergies    Amitiza [lubiprostone], Other, Ketorolac, Metoclopramide, Midodrine, and Fludrocortisone    Review of Systems   Review of Systems  Neurological:  Positive  for dizziness.    Physical Exam Updated Vital Signs BP 104/71   Pulse 81   Temp 98.1 F (36.7 C) (Oral)   Resp 16   Ht 5\' 3"  (1.6 m)   Wt 63 kg   SpO2 100%   BMI 24.60 kg/m  Physical Exam Vitals and nursing note reviewed.  Constitutional:      Appearance: She is well-developed.  HENT:     Head: Normocephalic and atraumatic.     Mouth/Throat:     Mouth: Mucous membranes are moist.  Eyes:     Pupils: Pupils are equal, round, and reactive to light.  Cardiovascular:     Rate and Rhythm: Normal rate and regular rhythm.  Pulmonary:     Effort: Pulmonary effort is  normal.     Breath sounds: No stridor.  Abdominal:     General: There is no distension.  Musculoskeletal:     Cervical back: Normal range of motion.  Skin:    General: Skin is warm and dry.  Neurological:     General: No focal deficit present.     Mental Status: She is alert.     ED Results / Procedures / Treatments   Labs (all labs ordered are listed, but only abnormal results are displayed) Labs Reviewed  BASIC METABOLIC PANEL  CBC  MAGNESIUM  I-STAT BETA HCG BLOOD, ED ([place], [facility], [facility] ONLY)    EKG EKG Interpretation  Date/Time:  Friday April 29 2022 18:58:35 EST Ventricular Rate:  83 PR Interval:  122 QRS Duration: 86 QT Interval:  360 QTC Calculation: 423 R Axis:   76 Text Interpretation: Normal sinus rhythm with sinus arrhythmia Normal ECG When compared with ECG of 31-May-2017 14:52, PREVIOUS ECG IS PRESENT Confirmed by Merrily Pew [phone]) on 04/30/2022 2:18:50 AM  Radiology No results found.  Procedures Procedures    Medications Ordered in ED Medications - No data to display  ED Course/ Medical Decision Making/ A&P                             Medical Decision Making Amount and/or Complexity of Data Reviewed Labs: ordered.   32 year old female with history of POTS and will Parkinson White status post ablation who presents ER today with multiple episodes of dizziness associated with palpitations and lightheadedness.  Low suspicion for PE.  No evidence of fluid overload.  Heart and lung exam is normal.  EKG is reassuring without any evidence of short PR.  Electrolytes within normal limits.  No obvious recent illnesses or trauma or any other suggestion of need for further workup at this time.  She can follow-up as an outpatient with her primary doctor and cardiology.  Final Clinical Impression(s) / ED Diagnoses Final diagnoses:  None    Rx / DC Orders ED Discharge Orders     None         Deneka Greenwalt, Corene Cornea, MD 04/30/22 [phone]

## 2022-05-02 ENCOUNTER — Telehealth: Payer: Self-pay | Admitting: Internal Medicine

## 2022-05-02 NOTE — Telephone Encounter (Signed)
VM left informing patient to return call.

## 2022-05-02 NOTE — Telephone Encounter (Signed)
Scheduled for 1st  available with requested PCP  on 05/27/2022.

## 2022-05-02 NOTE — Telephone Encounter (Signed)
Pt is calling to see if she can get a follow up appt with Dr. Wynetta Emery. Pt went to the emergency room for dizziness and was advised to follow up with PCP. CB- [phone]

## 2022-05-02 NOTE — Telephone Encounter (Signed)
Pt is returning missed call. Pt is requesting an ED follow up appointment. Pt was discharged 04/30/2022 from [facility] no appointments are available within the next 7-14 days.   Please advise.

## 2022-05-09 ENCOUNTER — Encounter (HOSPITAL_BASED_OUTPATIENT_CLINIC_OR_DEPARTMENT_OTHER): Payer: Medicaid Other | Admitting: Internal Medicine

## 2022-05-09 DIAGNOSIS — L089 Local infection of the skin and subcutaneous tissue, unspecified: Secondary | ICD-10-CM

## 2022-05-09 DIAGNOSIS — L729 Follicular cyst of the skin and subcutaneous tissue, unspecified: Secondary | ICD-10-CM | POA: Diagnosis not present

## 2022-05-09 MED ORDER — SULFAMETHOXAZOLE-TRIMETHOPRIM 800-160 MG PO TABS: 1.0000 | ORAL_TABLET | Freq: Two times a day (BID) | ORAL | 0 refills | Status: DC

## 2022-05-09 NOTE — Telephone Encounter (Signed)

## 2022-05-10 MED ORDER — SULFAMETHOXAZOLE-TRIMETHOPRIM 800-160 MG PO TABS
1.0000 | ORAL_TABLET | Freq: Two times a day (BID) | ORAL | 0 refills | Status: DC
  Filled 2022-05-10: qty 14, 7d supply, fill #0

## 2022-05-10 NOTE — Addendum Note (Signed)
Addended by: Karle Plumber B on: 05/10/2022 10:59 PM   Modules accepted: Orders

## 2022-05-11 ENCOUNTER — Other Ambulatory Visit: Payer: Self-pay

## 2022-05-13 ENCOUNTER — Other Ambulatory Visit: Payer: Self-pay

## 2022-05-20 ENCOUNTER — Ambulatory Visit: Payer: Self-pay | Admitting: *Deleted

## 2022-05-20 NOTE — Telephone Encounter (Signed)
Spoke with patient. Patient signs and symptoms are continuing  and have not improved. No appointment available today. Patient voices she is go to urgent care now.

## 2022-05-20 NOTE — Telephone Encounter (Signed)
  Chief Complaint: reaction to the Bactrim she is on my an infected cyst. Symptoms: diarrhea, weakness, hot/cold.   Did have shortness of breath earlier today but she is still weak and dizzy when she gets up and moves around.  Not sure if she has fever. Frequency: Been on Bactrim since 05/11/2022 has 2 pills left.    Diarrhea and feeling bad started this morning.  Shortness of breath and diarrhea has eased up a lot.   She's feeling a little better. Pertinent Negatives: Patient denies vomiting Disposition: []$ ED /[]$ Urgent Care (no appt availability in office) / []$ Appointment(In office/virtual)/ []$  [facility]/ [x]$ Home Care/ []$ Refused Recommended Disposition /[]$ [facility]/ [x]$  Follow-up with PCP Additional Notes: No appts at [facility] today.  She wants to wait and see how she does because she is feeling better now than she did this morning. She is wanting to know if she should not take the 2 pills left of the Bactrim. I have sent this to Dr Karle Plumber at [facility].  I instructed her to go to the ED if her symptoms became worse.

## 2022-05-20 NOTE — Telephone Encounter (Signed)
Reason for Disposition  [1] Caller has URGENT medicine question about med that PCP or specialist prescribed AND [2] triager unable to answer question  Answer Assessment - Initial Assessment Questions 1. NAME of MEDICINE: "What medicine(s) are you calling about?"     Bactrim 2. QUESTION: "What is your question?" (e.g., double dose of medicine, side effect)     I'm having diarrhea, difficulty breathing, hot and cold and still feeling weak.   The diarrhea has stopped.  It wasn't watery.    The symptoms all started this morning.    Bactrim since the 05/11/2022.   I had an infected cyst.    It's gotten a lot better.    3. PRESCRIBER: "Who prescribed the medicine?" Reason: if prescribed by specialist, call should be referred to that group.     Dr. Wynetta Emery   4. SYMPTOMS: "Do you have any symptoms?" If Yes, ask: "What symptoms are you having?"  "How bad are the symptoms (e.g., mild, moderate, severe)     Diarrhea, nausea but no vomiting.   My breathing is better but I'm still a little winded.   I start feeling bad when I get up and move around.    I'm dizzy when I stand up and my legs are heavy feeling.     5. PREGNANCY:  "Is there any chance that you are pregnant?" "When was your last menstrual period?"     No  Protocols used: Medication Question Call-A-AH

## 2022-05-27 ENCOUNTER — Encounter: Payer: Self-pay | Admitting: Internal Medicine

## 2022-05-27 ENCOUNTER — Ambulatory Visit: Payer: Medicaid Other | Attending: Internal Medicine | Admitting: Internal Medicine

## 2022-05-27 VITALS — BP 99/66 | HR 87 | Temp 98.4°F | Ht 63.0 in | Wt 136.0 lb

## 2022-05-27 DIAGNOSIS — B9689 Other specified bacterial agents as the cause of diseases classified elsewhere: Secondary | ICD-10-CM | POA: Insufficient documentation

## 2022-05-27 DIAGNOSIS — L729 Follicular cyst of the skin and subcutaneous tissue, unspecified: Secondary | ICD-10-CM | POA: Insufficient documentation

## 2022-05-27 DIAGNOSIS — D500 Iron deficiency anemia secondary to blood loss (chronic): Secondary | ICD-10-CM | POA: Diagnosis not present

## 2022-05-27 DIAGNOSIS — Z2821 Immunization not carried out because of patient refusal: Secondary | ICD-10-CM | POA: Diagnosis not present

## 2022-05-27 DIAGNOSIS — Z23 Encounter for immunization: Secondary | ICD-10-CM

## 2022-05-27 DIAGNOSIS — L089 Local infection of the skin and subcutaneous tissue, unspecified: Secondary | ICD-10-CM

## 2022-05-27 DIAGNOSIS — Z789 Other specified health status: Secondary | ICD-10-CM | POA: Diagnosis not present

## 2022-05-27 NOTE — Progress Notes (Signed)
Patient ID: Desiree Gutierrez, female    DOB: 1990/06/30  MRN: [id number]  CC: Follow-up (Iron deficiency f/u. Shirley Muscat breathing, diarrhea, weakness- poss adverse reaction to Bactrim/Discuss vaccines)   Subjective: Desiree Gutierrez is a 32 y.o. female who presents for follow up Her concerns today include:  Pt with hx of POTS syndrome, WPW s/p ablation 2011, constipation, IDA.    Patient had contacted me via MyChart on the fifth of this month complaining of a sore red bump on her upper back.  She did send pictures.  It looked like an infected cyst.  I prescribed Bactrim for 1 week for her.  Patient called in a week ago reporting severe diarrhea, weakness and shortness of breath which she attributed to the Bactrim.  She had 2 more doses left and decided not to take it.  She was to be seen at urgent care but did not go.  Patient tells me that she hydrated and went to sleep. Today she is feeling better.  This is ruptured and drained sometime last week.  There is currently a scab and drainage has ceased.  I had referred her to dermatology.  She states the appointment is in April.  In regards to her history of iron deficiency anemia, she is still taking iron supplement daily.  Last CBC revealed hemoglobin stable at 12.  Menses heavy but last cycle was light in the beginning and heavy towards the end.  HM: Showing that she is due for Tdap vaccine.  She does not think she had 1 within the past 10 years.  I explained to her what tetanus is and recommendations for updated booster every 10 years.    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     Calcium-Magnesium-Vitamin D (CALCIUM MAGNESIUM PO) Take 2 tablets by mouth daily.     Cholecalciferol (D-5000) 5000 UNITS TABS Take 5,000 Units by mouth daily.     Coenzyme Q10 (COQ10 PO) Take 1 tablet by mouth daily.     Cyanocobalamin (RA VITAMIN B-12 TR) 1000 MCG TBCR Take 1,000 mcg by mouth daily.     D-Ribose (RIBOSE, D,) POWD Take 200 g by mouth daily.     famotidine (PEPCID) 20 MG tablet Take 1 tablet (20 mg total) by mouth 2 (two) times daily. 20 tablet 6   ibuprofen (ADVIL) 600 MG tablet Take 1 tablet (600 mg total) by mouth 3 (three) times daily. X 10 days then prn pain 60 tablet 2   Multiple Vitamin (MULTIVITAMIN WITH MINERALS) TABS tablet Take 1 tablet by mouth daily.     Omega-3 1000 MG CAPS Take 1 capsule by mouth daily.     polyethylene glycol powder (GLYCOLAX/MIRALAX) 17 GM/SCOOP powder Take 17 g by mouth daily as needed. 3350 g 3   Probiotic Product (PROBIOTIC PO) Take 1 tablet by mouth daily.     sodium chloride 1 G tablet Take 1 g by  mouth 2 (two) times daily.     tranexamic acid (LYSTEDA) 650 MG TABS tablet Take 2 tablets (1,300 mg total) by mouth 3 (three) times daily. Take during menses for a maximum of five days 30 tablet 2   triamcinolone cream (KENALOG) 0.1 % Apply 1 application topically 2 (two) times daily. 30 g 0   sulfamethoxazole-trimethoprim (BACTRIM DS) 800-160 MG tablet Take 1 tablet by mouth 2 (two) times daily. (Patient not taking: Reported on 05/27/2022) 14 tablet 0   No current facility-administered medications on file prior to visit.    Allergies  Allergen Reactions   Amitiza [Lubiprostone] Anaphylaxis, Shortness Of Breath and Swelling   Other Palpitations   Ketorolac Palpitations   Metoclopramide Other (See Comments)    Heart raced    Midodrine Other (See Comments)    migraines   Fludrocortisone Rash    Social History   Socioeconomic History   Marital status: Single    Spouse name: Not on file   Number of children: 0   Years of education: 15   Highest education level: Not on file  Occupational History   Occupation: Charity fundraiser  Tobacco Use   Smoking status: Never   Smokeless tobacco: Never  Substance and Sexual Activity   Alcohol use: No    Alcohol/week: 0.0 standard drinks of alcohol   Drug use: No   Sexual activity: Never  Other Topics Concern   Not on file  Social History Narrative   Lives at home with mother   Caffeine use: none   Social Determinants of Health   Financial Resource Strain: Not on file  Food Insecurity: Not on file  Transportation Needs: Not on file  Physical Activity: Not on file  Stress: Not on file  Social Connections: Not on file  Intimate Partner Violence: Not on file    Family History  Problem Relation Age of Onset   Diabetes Mother    Hypertension Mother    Stroke Mother    Hypertension Brother    Hyperlipidemia Maternal Grandmother    Seizures Neg Hx     Past Surgical History:  Procedure Laterality Date   CARDIAC ELECTROPHYSIOLOGY STUDY AND ABLATION  2011   Dr. Thomasenia Sales at [facility]- left posterior pathway ablation for [place]  01/22/2014   MDT LINQ implanted by Dr Gelene Mink at [facility] for evaluation of palpitations/syncope   TILT TABLE STUDY  2011   neurally mediated syncope    ROS: Review of Systems Negative except as stated above  PHYSICAL EXAM: BP 99/66 (BP Location: Left Arm, Patient Position: Sitting, Cuff Size: Normal)   Pulse 87   Temp 98.4 F (36.9 C) (Oral)   Ht '5\' 3"'$  (1.6 m)   Wt 136 lb (61.7 kg)   SpO2 98%   BMI 24.09 kg/m   Physical Exam  General appearance - alert, well appearing, and in no distress Mental status - normal mood, behavior, speech, dress, motor activity, and thought processes Chest - clear to  auscultation, no wheezes, rales or rhonchi, symmetric air entry Heart - normal rate, regular rhythm, normal S1, S2, no murmurs, rubs, clicks or gallops Skin -she has some wrinkles of the skin and hide mild hyperpigmentation measuring about 2 cm across in the upper back where the cyst was.  No pus expressed.      Latest Ref Rng & Units 04/29/2022    7:33 PM 12/04/2020    3:28 PM 05/16/2019   11:51 AM  CMP  Glucose 70 -  99 mg/dL 77  103  73   BUN 6 - 20 mg/dL '10  16  12   '$ Creatinine 0.44 - 1.00 mg/dL 0.73  0.84  0.74   Sodium 135 - 145 mmol/L 138  139  142   Potassium 3.5 - 5.1 mmol/L 3.7  4.2  4.6   Chloride 98 - 111 mmol/L 101  103  101   CO2 22 - 32 mmol/L '25  24  24   '$ Calcium 8.9 - 10.3 mg/dL 10.0  10.0  9.5   Total Protein 6.0 - 8.5 g/dL  7.2    Total Bilirubin 0.0 - 1.2 mg/dL  0.2    Alkaline Phos 44 - 121 IU/L  63    AST 0 - 40 IU/L  18    ALT 0 - 32 IU/L  9     Lipid Panel  No results found for: "CHOL", "TRIG", "HDL", "CHOLHDL", "VLDL", "LDLCALC", "LDLDIRECT"  CBC    Component Value Date/Time   WBC 6.5 04/29/2022 1933   RBC 4.19 04/29/2022 1933   HGB 12.0 04/29/2022 1933   HGB 12.1 12/04/2020 1528   HCT 38.9 04/29/2022 1933   HCT 36.1 12/04/2020 1528   PLT 225 04/29/2022 1933   PLT 223 12/04/2020 1528   MCV 92.8 04/29/2022 1933   MCV 85 12/04/2020 1528   MCV 87 03/28/2014 1816   MCH 28.6 04/29/2022 1933   MCHC 30.8 04/29/2022 1933   RDW 11.7 04/29/2022 1933   RDW 11.5 (L) 12/04/2020 1528   RDW 12.7 03/28/2014 1816   LYMPHSABS 3.9 04/19/2016 1950   MONOABS 0.6 04/19/2016 1950   EOSABS 0.2 04/19/2016 1950   BASOSABS 0.0 04/19/2016 1950    ASSESSMENT AND PLAN:  1. Infected cyst of skin This has ruptured and drained on its own.  Advised patient that these can sometimes return.  She will keep appointment with dermatology in April.  2. Iron deficiency anemia due to chronic blood loss Hemoglobin stable on daily iron which she takes over-the-counter.  3. Need  for Tdap vaccination Patient agreeable to receiving booster today.  This was given.  4. Influenza vaccination declined Patient declines flu vaccine.  5. Medication intolerance Bactrim added to her allergy list.    Patient was given the opportunity to ask questions.  Patient verbalized understanding of the plan and was able to repeat key elements of the plan.   This documentation was completed using Radio producer.  Any transcriptional errors are unintentional.  No orders of the defined types were placed in this encounter.    Requested Prescriptions    No prescriptions requested or ordered in this encounter    No follow-ups on file.  Karle Plumber, MD, FACP

## 2022-05-30 ENCOUNTER — Encounter: Payer: Self-pay | Admitting: Internal Medicine

## 2022-07-01 ENCOUNTER — Encounter: Payer: Medicaid Other | Admitting: Internal Medicine

## 2022-07-05 ENCOUNTER — Other Ambulatory Visit (HOSPITAL_COMMUNITY)
Admission: RE | Admit: 2022-07-05 | Discharge: 2022-07-05 | Disposition: A | Discharge status: Home / Self Care | Payer: Medicaid Other | Source: Ambulatory Visit | Attending: Internal Medicine | Admitting: Internal Medicine

## 2022-07-05 ENCOUNTER — Ambulatory Visit: Payer: Medicaid Other | Attending: Internal Medicine | Admitting: Internal Medicine

## 2022-07-05 ENCOUNTER — Encounter: Payer: Self-pay | Admitting: Internal Medicine

## 2022-07-05 VITALS — BP 98/62 | HR 98 | Temp 98.9°F | Ht 63.0 in | Wt 135.0 lb

## 2022-07-05 DIAGNOSIS — Z124 Encounter for screening for malignant neoplasm of cervix: Secondary | ICD-10-CM

## 2022-07-05 DIAGNOSIS — Z0000 Encounter for general adult medical examination without abnormal findings: Secondary | ICD-10-CM

## 2022-07-05 NOTE — Patient Instructions (Signed)

## 2022-07-05 NOTE — Progress Notes (Signed)
Patient ID: Desiree Gutierrez, female    DOB: Aug 27, 1990  MRN: [id number]  CC: Annual Exam (Annual physical with pap. /)   Subjective: Desiree Gutierrez is a 32 y.o. female who presents for annual exam Her concerns today include:  Pt with hx of POTS syndrome, WPW s/p ablation 2011, constipation, IDA.    GYN History:  Pt is G0P0 Any hx of abn paps?: no Menses regular or irregular?: regular How long does menses last? 7 days Menstrual flow light or heavy?: heavy from day 3-5 Method of birth control?:  no Any vaginal dischg at this time?:  no Dysuria?: no Any hx of STI?:  no Sexually active with how many partners:  not active at this time Desires STI screen:  no Last MMG: NA Family hx of uterine, cervical or breast cancer?:  no  Patient Active Problem List   Diagnosis Date Noted   Vertigo 10/27/2016   Concussion with no loss of consciousness [id number]   Diastolic dysfunction [id number]   Vocal cord dysfunction 06/02/2015   Pain of molar 02/13/2015   Hypoglycemia    POTS (postural orthostatic tachycardia syndrome) 06/01/2014   Iron deficiency anemia 06/01/2014   Syncope 12/03/2010   WOLFF-PARKINSON-WHITE (WPW) SYNDROME status post ablation 08/20/2009   Constipation 08/19/2009     Current Outpatient Medications on File Prior to Visit  Medication Sig Dispense Refill   Ascorbic Acid (VITAMIN C) 1000 MG tablet Take 1,000 mg by mouth daily.     Calcium-Magnesium-Vitamin D (CALCIUM MAGNESIUM PO) Take 2 tablets by mouth daily.     Cholecalciferol (D-5000) 5000 UNITS TABS Take 5,000 Units by mouth daily.     Coenzyme Q10 (COQ10 PO) Take 1 tablet by mouth daily.     Cyanocobalamin (RA VITAMIN B-12 TR) 1000 MCG TBCR Take 1,000 mcg by mouth daily.     D-Ribose (RIBOSE, D,) POWD Take 200 g by mouth daily.     famotidine (PEPCID) 20 MG tablet Take 1 tablet (20 mg total) by mouth 2 (two) times daily. 20 tablet 6   ibuprofen (ADVIL) 600 MG tablet Take 1 tablet (600 mg total) by mouth 3  (three) times daily. X 10 days then prn pain 60 tablet 2   Multiple Vitamin (MULTIVITAMIN WITH MINERALS) TABS tablet Take 1 tablet by mouth daily.     Omega-3 1000 MG CAPS Take 1 capsule by mouth daily.     polyethylene glycol powder (GLYCOLAX/MIRALAX) 17 GM/SCOOP powder Take 17 g by mouth daily as needed. 3350 g 3   Probiotic Product (PROBIOTIC PO) Take 1 tablet by mouth daily.     sodium chloride 1 G tablet Take 1 g by mouth 2 (two) times daily.     tranexamic acid (LYSTEDA) 650 MG TABS tablet Take 2 tablets (1,300 mg total) by mouth 3 (three) times daily. Take during menses for a maximum of five days 30 tablet 2   triamcinolone cream (KENALOG) 0.1 % Apply 1 application topically 2 (two) times daily. 30 g 0   No current facility-administered medications on file prior to visit.    Allergies  Allergen Reactions   Amitiza [Lubiprostone] Anaphylaxis, Shortness Of Breath and Swelling   Other Palpitations   Ketorolac Palpitations   Metoclopramide Other (See Comments)    Heart raced   Midodrine Other (See Comments)    migraines   Bactrim [Sulfamethoxazole-Trimethoprim]     Diarrhea, weakness, SOB   Fludrocortisone Rash    Social History   Socioeconomic History   Marital status: Single  Spouse name: Not on file   Number of children: 0   Years of education: 15   Highest education level: Not on file  Occupational History   Occupation: Charity fundraiser  Tobacco Use   Smoking status: Never   Smokeless tobacco: Never  Substance and Sexual Activity   Alcohol use: No    Alcohol/week: 0.0 standard drinks of alcohol   Drug use: No   Sexual activity: Never  Other Topics Concern   Not on file  Social History Narrative   Lives at home with mother   Caffeine use: none   Social Determinants of Health   Financial Resource Strain: Not on file  Food Insecurity: Not on file  Transportation Needs: Not on file  Physical Activity: Not on file  Stress: Not on file  Social Connections:  Not on file  Intimate Partner Violence: Not on file    Family History  Problem Relation Age of Onset   Diabetes Mother    Hypertension Mother    Stroke Mother    Hypertension Brother    Hyperlipidemia Maternal Grandmother    Seizures Neg Hx     Past Surgical History:  Procedure Laterality Date   CARDIAC ELECTROPHYSIOLOGY STUDY AND ABLATION  2011   Dr. Thomasenia Sales at [facility]- left posterior pathway ablation for [place]  01/22/2014   MDT LINQ implanted by Dr Gelene Mink at [facility] for evaluation of palpitations/syncope   TILT TABLE STUDY  2011   neurally mediated syncope    ROS: Review of Systems  Constitutional:        Patient is very active.  She walks daily.  Does well with her eating habits.  HENT:  Negative for dental problem, hearing loss, sore throat and trouble swallowing.   Eyes:        Wears reading glasses.  Last eye exam was 2 yrs ago.  Plans to schedule an appt  Respiratory:  Negative for cough and shortness of breath.   Cardiovascular:  Negative for chest pain.  Gastrointestinal:        Moving bowels okay  Genitourinary:  Negative for difficulty urinating.   Negative except as stated above  PHYSICAL EXAM: BP 98/62 (BP Location: Left Arm, Patient Position: Sitting, Cuff Size: Normal)   Pulse 98   Temp 98.9 F (37.2 C) (Oral)   Ht 5\' 3"  (1.6 m)   Wt 135 lb (61.2 kg)   SpO2 100%   BMI 23.91 kg/m   Physical Exam  General appearance - alert, well appearing, and in no distress Mental status - normal mood, behavior, speech, dress, motor activity, and thought processes Eyes - pupils equal and reactive, extraocular eye movements intact Ears - bilateral TM's and external ear canals normal Nose - normal and patent, no erythema, discharge or polyps Mouth - mucous membranes moist, pharynx normal without lesions Neck - supple, no significant adenopathy Lymphatics - no palpable lymphadenopathy, no hepatosplenomegaly Chest - clear to auscultation, no  wheezes, rales or rhonchi, symmetric air entry Heart - normal rate, regular rhythm, normal S1, S2, no murmurs, rubs, clicks or gallops Abdomen - soft, nontender, nondistended, no masses or organomegaly Breasts - CMA Clarisa present for breast and pelvic exam:  breasts appear normal, no suspicious masses, no skin or nipple changes or axillary nodes Pelvic - normal external genitalia, vulva, vagina, cervix, uterus and adnexa Musculoskeletal - no joint tenderness, deformity or swelling Extremities - peripheral pulses normal, no pedal edema, no clubbing or cyanosis  Latest Ref Rng & Units 04/29/2022    7:33 PM 12/04/2020    3:28 PM 05/16/2019   11:51 AM  CMP  Glucose 70 - 99 mg/dL 77  103  73   BUN 6 - 20 mg/dL 10  16  12    Creatinine 0.44 - 1.00 mg/dL 0.73  0.84  0.74   Sodium 135 - 145 mmol/L 138  139  142   Potassium 3.5 - 5.1 mmol/L 3.7  4.2  4.6   Chloride 98 - 111 mmol/L 101  103  101   CO2 22 - 32 mmol/L 25  24  24    Calcium 8.9 - 10.3 mg/dL 10.0  10.0  9.5   Total Protein 6.0 - 8.5 g/dL  7.2    Total Bilirubin 0.0 - 1.2 mg/dL  0.2    Alkaline Phos 44 - 121 IU/L  63    AST 0 - 40 IU/L  18    ALT 0 - 32 IU/L  9     Lipid Panel  No results found for: "CHOL", "TRIG", "HDL", "CHOLHDL", "VLDL", "LDLCALC", "LDLDIRECT"  CBC    Component Value Date/Time   WBC 6.5 04/29/2022 1933   RBC 4.19 04/29/2022 1933   HGB 12.0 04/29/2022 1933   HGB 12.1 12/04/2020 1528   HCT 38.9 04/29/2022 1933   HCT 36.1 12/04/2020 1528   PLT 225 04/29/2022 1933   PLT 223 12/04/2020 1528   MCV 92.8 04/29/2022 1933   MCV 85 12/04/2020 1528   MCV 87 03/28/2014 1816   MCH 28.6 04/29/2022 1933   MCHC 30.8 04/29/2022 1933   RDW 11.7 04/29/2022 1933   RDW 11.5 (L) 12/04/2020 1528   RDW 12.7 03/28/2014 1816   LYMPHSABS 3.9 04/19/2016 1950   MONOABS 0.6 04/19/2016 1950   EOSABS 0.2 04/19/2016 1950   BASOSABS 0.0 04/19/2016 1950    ASSESSMENT AND PLAN:  1. Annual physical exam Well  adult. Continue healthy eating habits and regular exercise.  Patient to schedule an eye exam.  Encouraged to continue routine dental cleanings at least twice a year.  2. Pap smear for cervical cancer screening - Cytology - PAP    Patient was given the opportunity to ask questions.  Patient verbalized understanding of the plan and was able to repeat key elements of the plan.   This documentation was completed using Radio producer.  Any transcriptional errors are unintentional.  No orders of the defined types were placed in this encounter.    Requested Prescriptions    No prescriptions requested or ordered in this encounter    No follow-ups on file.  Karle Plumber, MD, FACP

## 2022-07-08 LAB — CYTOLOGY - PAP
Comment: NEGATIVE
Diagnosis: NEGATIVE
Diagnosis: REACTIVE
High risk HPV: NEGATIVE

## 2023-04-23 ENCOUNTER — Encounter: Payer: Self-pay | Admitting: Internal Medicine

## 2023-04-24 ENCOUNTER — Ambulatory Visit: Payer: Medicaid Other | Attending: Internal Medicine | Admitting: Internal Medicine

## 2023-04-24 ENCOUNTER — Other Ambulatory Visit: Payer: Self-pay

## 2023-04-24 VITALS — BP 94/62 | HR 96 | Ht 63.0 in | Wt 136.0 lb

## 2023-04-24 DIAGNOSIS — G90A Postural orthostatic tachycardia syndrome (POTS): Secondary | ICD-10-CM | POA: Insufficient documentation

## 2023-04-24 DIAGNOSIS — D509 Iron deficiency anemia, unspecified: Secondary | ICD-10-CM | POA: Insufficient documentation

## 2023-04-24 DIAGNOSIS — H1132 Conjunctival hemorrhage, left eye: Secondary | ICD-10-CM | POA: Diagnosis not present

## 2023-04-24 DIAGNOSIS — M79604 Pain in right leg: Secondary | ICD-10-CM | POA: Diagnosis present

## 2023-04-24 DIAGNOSIS — Z79899 Other long term (current) drug therapy: Secondary | ICD-10-CM | POA: Diagnosis not present

## 2023-04-24 DIAGNOSIS — I456 Pre-excitation syndrome: Secondary | ICD-10-CM | POA: Diagnosis not present

## 2023-04-24 MED ORDER — METHOCARBAMOL 500 MG PO TABS
500.0000 mg | ORAL_TABLET | Freq: Every day | ORAL | 0 refills | Status: AC | PRN
  Filled 2023-04-24: qty 10, 10d supply, fill #0

## 2023-04-24 MED ORDER — NAPROXEN 500 MG PO TABS
500.0000 mg | ORAL_TABLET | Freq: Two times a day (BID) | ORAL | 0 refills | Status: AC | PRN
  Filled 2023-04-24: qty 30, 15d supply, fill #0

## 2023-04-24 NOTE — Progress Notes (Signed)
Patient ID: Desiree Gutierrez, female    DOB: 1990-10-09  MRN: [id number]  CC: Leg Pain (R leg pain X1 week - unable to recall any injuries. /Reports that pain was sudden during sleep and has been consistent/No to flu vax.)   Subjective: Desiree Gutierrez is a 33 y.o. female who presents for UC visit. Her concerns today include:  Pt with hx of POTS syndrome, WPW s/p ablation 2011, constipation, IDA.    Discussed the use of AI scribe software for clinical note transcription with the patient, who gave verbal consent to proceed.  History of Present Illness   The patient, with a history of heart surgery, presents with a week-long history of sudden, sharp, constant pain in the right leg. The pain radiates from the hip down the leg, affecting the side, front, and back. The pain was severe enough to limit mobility and prevent weight-bearing on the affected leg. The skin over the painful area was cold and appeared blue, suggestive of bruising. The patient denies any recent strenuous activity or injury that could have precipitated the pain. The pain was severe, rated at 10/10 initially, and has since decreased to 7/10. The patient also reports some puffiness in the leg, but denies any numbness. There was transient tingling in the foot, which has since resolved. The patient has been managing the pain with Tylenol and Biofreeze, with limited relief. Not on any long term steroids.    She also noted a red dot in the left eye today.  No pain.  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     Calcium-Magnesium-Vitamin D (CALCIUM MAGNESIUM PO) Take 2 tablets by mouth daily.     Cholecalciferol (D-5000) 5000 UNITS TABS Take 5,000 Units by mouth daily.     Coenzyme Q10 (COQ10 PO) Take 1 tablet by mouth daily.     Cyanocobalamin (RA VITAMIN B-12 TR) 1000 MCG TBCR Take 1,000 mcg by mouth daily.     D-Ribose (RIBOSE, D,) POWD Take 200 g by mouth daily.     ibuprofen (ADVIL) 600 MG tablet Take 1 tablet (600 mg total) by mouth 3 (three) times daily. X 10 days then prn pain 60 tablet 2   Multiple Vitamin (MULTIVITAMIN WITH MINERALS) TABS tablet Take 1 tablet by mouth daily.     Omega-3 1000 MG CAPS Take 1 capsule by mouth daily.     polyethylene glycol powder (GLYCOLAX/MIRALAX) 17 GM/SCOOP powder Take 17 g by mouth daily as needed. 3350 g 3   Probiotic Product (PROBIOTIC PO) Take 1 tablet by mouth daily.     sodium chloride 1 G tablet Take 1 g by mouth 2 (two) times daily.     famotidine (PEPCID) 20 MG tablet Take 1 tablet (20 mg total) by mouth 2 (two) times daily. (Patient not taking: Reported on 04/24/2023) 20 tablet 6   tranexamic acid (LYSTEDA) 650 MG TABS tablet Take 2 tablets (1,300  mg total) by mouth 3 (three) times daily. Take during menses for a maximum of five days (Patient not taking: Reported on 04/24/2023) 30 tablet 2   No current facility-administered medications on file prior to visit.    Allergies  Allergen Reactions   Amitiza [Lubiprostone] Anaphylaxis, Shortness Of Breath and Swelling   Other Palpitations   Ketorolac Palpitations   Metoclopramide Other (See Comments)    Heart raced   Midodrine Other (See Comments)    migraines   Bactrim [Sulfamethoxazole-Trimethoprim]     Diarrhea, weakness, SOB   Fludrocortisone Rash    Social History   Socioeconomic History   Marital status: Single    Spouse  name: Not on file   Number of children: 0   Years of education: 15   Highest education level: Not on file  Occupational History   Occupation: Physicist, medical  Tobacco Use   Smoking status: Never   Smokeless tobacco: Never  Substance and Sexual Activity   Alcohol use: No    Alcohol/week: 0.0 standard drinks of alcohol   Drug use: No   Sexual activity: Never  Other Topics Concern   Not on file  Social History Narrative   Lives at home with mother   Caffeine use: none   Social Drivers of Corporate investment banker Strain: Medium Risk (04/24/2023)   Overall Financial Resource Strain (CARDIA)    Difficulty of Paying Living Expenses: Somewhat hard  Food Insecurity: No Food Insecurity (04/24/2023)   Hunger Vital Sign    Worried About Running Out of Food in the Last Year: Never true    Ran Out of Food in the Last Year: Never true  Transportation Needs: No Transportation Needs (04/24/2023)   PRAPARE - Administrator, Civil Service (Medical): No    Lack of Transportation (Non-Medical): No  Physical Activity: Sufficiently Active (04/24/2023)   Exercise Vital Sign    Days of Exercise per Week: 4 days    Minutes of Exercise per Session: 60 min  Stress: No Stress Concern Present (04/24/2023)   [employer] of Occupational Health - Occupational Stress Questionnaire    Feeling of Stress : Not at all  Social Connections: Moderately Isolated (04/24/2023)   Social Connection and Isolation Panel [NHANES]    Frequency of Communication with Friends and Family: Once a week    Frequency of Social Gatherings with Friends and Family: Once a week    Attends Religious Services: 1 to 4 times per year    Active Member of [employer] or Organizations: Yes    Attends Banker Meetings: More than 4 times per year    Marital Status: Never married  Intimate Partner Violence: Not At Risk (04/24/2023)   Humiliation, Afraid, Rape, and Kick questionnaire    Fear of Current or Ex-Partner:  No    Emotionally Abused: No    Physically Abused: No    Sexually Abused: No    Family History  Problem Relation Age of Onset   Diabetes Mother    Hypertension Mother    Stroke Mother    Hypertension Brother    Hyperlipidemia Maternal Grandmother    Seizures Neg Hx     Past Surgical History:  Procedure Laterality Date   CARDIAC ELECTROPHYSIOLOGY STUDY AND ABLATION  2011   Dr. Gevena Cotton at [facility]- left posterior pathway ablation for WPW   LOOP RECORDER IMPLANT  01/22/2014   MDT LINQ implanted by Dr Alfonso Ellis at [facility] for evaluation of palpitations/syncope   TILT  TABLE STUDY  2011   neurally mediated syncope    ROS: Review of Systems Negative except as stated above  PHYSICAL EXAM: BP 94/62 (BP Location: Left Arm, Patient Position: Sitting, Cuff Size: Normal)   Pulse 96   Ht 5\' 3"  (1.6 m)   Wt 136 lb (61.7 kg)   SpO2 100%   BMI 24.09 kg/m   Physical Exam  General appearance - alert, well appearing, and in no distress Mental status - normal mood, behavior, speech, dress, motor activity, and thought processes Eyes -left eye: Small red spot lateral to the iris.  She has good EOMI bilaterally.  No conjunctival injection. Musculoskeletal -right leg: No edema or erythema noted.  No bruising or ecchymosis noted on the thigh or the lower leg.  She has 3+ dorsalis pedis, posterior tibialis, popliteal and radial pulses bilaterally.  Denna Haggard' sign negative.  Mild tenderness over the trochanteric bursa and with rotation of the hip.        Latest Ref Rng & Units 04/29/2022    7:33 PM 12/04/2020    3:28 PM 05/16/2019   11:51 AM  CMP  Glucose 70 - 99 mg/dL 77  161  73   BUN 6 - 20 mg/dL 10  16  12    Creatinine 0.44 - 1.00 mg/dL 0.96  0.45  4.09   Sodium 135 - 145 mmol/L 138  139  142   Potassium 3.5 - 5.1 mmol/L 3.7  4.2  4.6   Chloride 98 - 111 mmol/L 101  103  101   CO2 22 - 32 mmol/L 25  24  24    Calcium 8.9 - 10.3 mg/dL 81.1  91.4  9.5   Total Protein 6.0 - 8.5 g/dL  7.2     Total Bilirubin 0.0 - 1.2 mg/dL  0.2    Alkaline Phos 44 - 121 IU/L  63    AST 0 - 40 IU/L  18    ALT 0 - 32 IU/L  9     Lipid Panel  No results found for: "CHOL", "TRIG", "HDL", "CHOLHDL", "VLDL", "LDLCALC", "LDLDIRECT"  CBC    Component Value Date/Time   WBC 6.5 04/29/2022 1933   RBC 4.19 04/29/2022 1933   HGB 12.0 04/29/2022 1933   HGB 12.1 12/04/2020 1528   HCT 38.9 04/29/2022 1933   HCT 36.1 12/04/2020 1528   PLT 225 04/29/2022 1933   PLT 223 12/04/2020 1528   MCV 92.8 04/29/2022 1933   MCV 85 12/04/2020 1528   MCV 87 03/28/2014 1816   MCH 28.6 04/29/2022 1933   MCHC 30.8 04/29/2022 1933   RDW 11.7 04/29/2022 1933   RDW 11.5 (L) 12/04/2020 1528   RDW 12.7 03/28/2014 1816   LYMPHSABS 3.9 04/19/2016 1950   MONOABS 0.6 04/19/2016 1950   EOSABS 0.2 04/19/2016 1950   BASOSABS 0.0 04/19/2016 1950    ASSESSMENT AND PLAN: 1. Acute leg pain, right (Primary) Suspect this is musculoskeletal in nature rather than vascular. Recommend trial of NSAID in the form of Naprosyn twice a day as needed.  Advised to take with food.  Is also given low-dose of Robaxin to take as needed.  Advised that the medication can cause drowsiness.  Advised use of a heating pad. - naproxen (NAPROSYN) 500 MG tablet; Take 1 tablet (500 mg total) by mouth 2 (two) times daily as needed. With food.  Dispense: 30 tablet; Refill: 0 - methocarbamol (ROBAXIN) 500 MG tablet; Take 1 tablet (500 mg total) by mouth daily as needed  for muscle spasms.  Dispense: 10 tablet; Refill: 0  2. Subconjunctival hemorrhage of left eye Looks like this may be a tiny subconjunctival hemorrhage versus small pterygium.  Advised observation.  Patient was given the opportunity to ask questions.  Patient verbalized understanding of the plan and was able to repeat key elements of the plan.   This documentation was completed using Paediatric nurse.  Any transcriptional errors are unintentional.  No orders of the  defined types were placed in this encounter.    Requested Prescriptions   Signed Prescriptions Disp Refills   naproxen (NAPROSYN) 500 MG tablet 30 tablet 0    Sig: Take 1 tablet (500 mg total) by mouth 2 (two) times daily as needed. With food.   methocarbamol (ROBAXIN) 500 MG tablet 10 tablet 0    Sig: Take 1 tablet (500 mg total) by mouth daily as needed for muscle spasms.    Return if symptoms worsen or fail to improve.  Jonah Blue, MD, FACP

## 2023-04-24 NOTE — Patient Instructions (Signed)
Use the Naprosyn as needed for pain.  Take with food.  Watch out for any palpitations.  You can take the Robaxin at bedtime.  It can cause some drowsiness.  I also recommend using a heating pad.

## 2023-10-19 ENCOUNTER — Ambulatory Visit: Attending: Internal Medicine | Admitting: Internal Medicine

## 2023-10-19 ENCOUNTER — Encounter: Payer: Self-pay | Admitting: Internal Medicine

## 2023-10-19 VITALS — BP 104/66 | HR 80 | Temp 98.5°F | Ht 63.0 in | Wt 135.0 lb

## 2023-10-19 DIAGNOSIS — R5383 Other fatigue: Secondary | ICD-10-CM

## 2023-10-19 DIAGNOSIS — G43829 Menstrual migraine, not intractable, without status migrainosus: Secondary | ICD-10-CM | POA: Diagnosis not present

## 2023-10-19 DIAGNOSIS — Z862 Personal history of diseases of the blood and blood-forming organs and certain disorders involving the immune mechanism: Secondary | ICD-10-CM | POA: Diagnosis not present

## 2023-10-19 DIAGNOSIS — Z7182 Exercise counseling: Secondary | ICD-10-CM | POA: Diagnosis not present

## 2023-10-19 DIAGNOSIS — Z28310 Unvaccinated for covid-19: Secondary | ICD-10-CM | POA: Diagnosis not present

## 2023-10-19 DIAGNOSIS — Z0000 Encounter for general adult medical examination without abnormal findings: Secondary | ICD-10-CM | POA: Insufficient documentation

## 2023-10-19 DIAGNOSIS — L700 Acne vulgaris: Secondary | ICD-10-CM | POA: Diagnosis not present

## 2023-10-19 NOTE — Patient Instructions (Signed)
 VISIT SUMMARY:  Today, you were seen for increasing headaches, lightheadedness, and fatigue, which you associate with your menstrual cycle. We also discussed a lesion on your neck and reviewed your general health maintenance.  YOUR PLAN:  -MENSTRUAL-RELATED HEADACHES: Your headaches during ovulation and menstruation are likely menstrual migraines. We will check your complete blood count and iron  levels. Continue using Midol  for relief.  -IRON  DEFICIENCY ANEMIA: Your symptoms suggest low iron  levels, especially during menstruation. We will reassess your iron  levels with a complete blood count and iron  test.  -NECK LESION: The lesion on your neck appears to be a blackhead. Monitor it for any changes. If it becomes bothersome or increases in size, we will refer you to a dermatologist.  -GENERAL HEALTH MAINTENANCE: We discussed your vaccinations and lifestyle habits. We will check your HPV vaccination status and administer the vaccine if needed. Avoid chicken if it causes nausea, maintain a plant-based diet with lean meats, continue regular exercise, and eat fresh fruits and vegetables. Schedule an eye exam every two years and dental cleanings twice a year.  INSTRUCTIONS:  We will follow up with your complete blood count and iron  levels. If the lesion on your neck changes, please let us  know. Ensure you are up to date with your HPV vaccine, and continue with your healthy lifestyle habits.

## 2023-10-19 NOTE — Progress Notes (Signed)
 Patient ID: Desiree Gutierrez, female    DOB: Jul 18, 1990  MRN: [id number]  CC: Annual Exam (Physical./Increasing headaches, lightheadedness, fatigue - concerned about iron  levels due to past deficiency /Concern about mole on L side of throat - intermittent itching, mole now drying out /Discuss HPV vax)   Subjective: Desiree Gutierrez is a 33 y.o. female who presents for annual physical Her concerns today include:  Pt with hx of POTS syndrome, WPW s/p ablation 2011, constipation, IDA.    Discussed the use of AI scribe software for clinical note transcription with the patient, who gave verbal consent to proceed.  History of Present Illness Desiree Gutierrez is a 33 year old female who presents with increasing headaches, lightheadedness, and fatigue.  She thinks the symptoms may be due to low iron  level.  States that she felt the same in the past when she was anemic or iron  level was low.  She experiences headaches localized to the crown of her head, occurring during ovulation and menstruation. The headaches last about 30 minutes if she does not take anything for them. These headaches resolve quickly with Midol  or Tylenol . She also experiences nausea with the headaches but no blurred vision.  She reports lightheadedness and fatigue, which she associates with possible low iron  level. Her menstrual cycles are regular, lasting seven days with increased bleeding on the second and third days, sometimes with clotting. She has a history of iron  deficiency and has been on iron  supplements in the past.  Overall she feels she is doing okay with her eating habits.  She consumes meat, fresh fruits, and vegetables daily. However, chicken sometimes causes nausea. She exercises regularly, primarily doing cardio and walking five days a week.  She has a lesion on the left anterior side of her neck that has been present for a couple of months, occasionally itches, and burst in May.  She wears prescription reading  glasses due to nearsightedness and has not had an eye exam recently.  HM: She has never had COVID-19 vaccine series and does not want it.  She thinks she may have had 1 or 2 shots of HPV vaccine series.  She will check her immunization records that she has at home.  We did check the   immunization database and looks like she has not had the series.    Patient Active Problem List   Diagnosis Date Noted   Vertigo 10/27/2016   Concussion with no loss of consciousness 09/19/2016   Diastolic dysfunction 09/08/2015   Vocal cord dysfunction 06/02/2015   Pain of molar 02/13/2015   Hypoglycemia    POTS (postural orthostatic tachycardia syndrome) 06/01/2014   Iron  deficiency anemia 06/01/2014   Syncope 12/03/2010   WOLFF-PARKINSON-WHITE (WPW) SYNDROME status post ablation 08/20/2009   Constipation 08/19/2009     Current Outpatient Medications on File Prior to Visit  Medication Sig Dispense Refill   Ascorbic Acid (VITAMIN C) 1000 MG tablet Take 1,000 mg by mouth daily.     Calcium-Magnesium-Vitamin D  (CALCIUM MAGNESIUM PO) Take 2 tablets by mouth daily.     Cholecalciferol (D-5000) 5000 UNITS TABS Take 5,000 Units by mouth daily.     Coenzyme Q10 (COQ10 PO) Take 1 tablet by mouth daily.     Cyanocobalamin (RA VITAMIN B-12 TR) 1000 MCG TBCR Take 1,000 mcg by mouth daily.     D-Ribose (RIBOSE, D,) POWD Take 200 g by mouth daily.     famotidine  (PEPCID ) 20 MG tablet Take 1 tablet (20 mg total) by  mouth 2 (two) times daily. 20 tablet 6   ibuprofen  (ADVIL ) 600 MG tablet Take 1 tablet (600 mg total) by mouth 3 (three) times daily. X 10 days then prn pain 60 tablet 2   methocarbamol  (ROBAXIN ) 500 MG tablet Take 1 tablet (500 mg total) by mouth daily as needed for muscle spasms. 10 tablet 0   Multiple Vitamin (MULTIVITAMIN WITH MINERALS) TABS tablet Take 1 tablet by mouth daily.     naproxen  (NAPROSYN ) 500 MG tablet Take 1 tablet (500 mg total) by mouth 2 (two) times daily as needed. With  food. 30 tablet 0   Omega-3 1000 MG CAPS Take 1 capsule by mouth daily.     polyethylene glycol powder (GLYCOLAX /MIRALAX ) 17 GM/SCOOP powder Take 17 g by mouth daily as needed. 3350 g 3   Probiotic Product (PROBIOTIC PO) Take 1 tablet by mouth daily.     sodium chloride  1 G tablet Take 1 g by mouth 2 (two) times daily.     tranexamic acid  (LYSTEDA ) 650 MG TABS tablet Take 2 tablets (1,300 mg total) by mouth 3 (three) times daily. Take during menses for a maximum of five days 30 tablet 2   No current facility-administered medications on file prior to visit.    Allergies  Allergen Reactions   Amitiza [Lubiprostone] Anaphylaxis, Shortness Of Breath and Swelling   Other Palpitations   Ketorolac  Palpitations   Metoclopramide  Other (See Comments)    Heart raced   Midodrine Other (See Comments)    migraines   Bactrim  [Sulfamethoxazole -Trimethoprim ]     Diarrhea, weakness, SOB   Fludrocortisone  Rash    Social History   Socioeconomic History   Marital status: Single    Spouse name: Not on file   Number of children: 0   Years of education: 15   Highest education level: Not on file  Occupational History   Occupation: Physicist, medical  Tobacco Use   Smoking status: Never   Smokeless tobacco: Never  Substance and Sexual Activity   Alcohol use: No    Alcohol/week: 0.0 standard drinks of alcohol   Drug use: No   Sexual activity: Never  Other Topics Concern   Not on file  Social History Narrative   Lives at home with mother   Caffeine use: none   Social Drivers of Corporate investment banker Strain: Medium Risk (04/24/2023)   Overall Financial Resource Strain (CARDIA)    Difficulty of Paying Living Expenses: Somewhat hard  Food Insecurity: No Food Insecurity (04/24/2023)   Hunger Vital Sign    Worried About Running Out of Food in the Last Year: Never true    Ran Out of Food in the Last Year: Never true  Transportation Needs: No Transportation Needs (04/24/2023)   PRAPARE -  Administrator, Civil Service (Medical): No    Lack of Transportation (Non-Medical): No  Physical Activity: Sufficiently Active (04/24/2023)   Exercise Vital Sign    Days of Exercise per Week: 4 days    Minutes of Exercise per Session: 60 min  Stress: No Stress Concern Present (04/24/2023)   [employer] of Occupational Health - Occupational Stress Questionnaire    Feeling of Stress : Not at all  Social Connections: Moderately Isolated (04/24/2023)   Social Connection and Isolation Panel    Frequency of Communication with Friends and Family: Once a week    Frequency of Social Gatherings with Friends and Family: Once a week    Attends Religious Services: 1 to 4  times per year    Active Member of Clubs or Organizations: Yes    Attends Banker Meetings: More than 4 times per year    Marital Status: Never married  Intimate Partner Violence: Not At Risk (04/24/2023)   Humiliation, Afraid, Rape, and Kick questionnaire    Fear of Current or Ex-Partner: No    Emotionally Abused: No    Physically Abused: No    Sexually Abused: No    Family History  Problem Relation Age of Onset   Diabetes Mother    Hypertension Mother    Stroke Mother    Hypertension Brother    Hyperlipidemia Maternal Grandmother    Seizures Neg Hx     Past Surgical History:  Procedure Laterality Date   CARDIAC ELECTROPHYSIOLOGY STUDY AND ABLATION  2011   Dr. Zimmern at [facility]- left posterior pathway ablation for WPW   LOOP RECORDER IMPLANT  01/22/2014   MDT LINQ implanted by Dr Levis at [facility] for evaluation of palpitations/syncope   TILT TABLE STUDY  2011   neurally mediated syncope    ROS: Review of Systems  Respiratory:  Negative for shortness of breath.   Gastrointestinal:        Occasional constipation.   Negative except as stated above  PHYSICAL EXAM: BP 104/66 (BP Location: Left Arm, Patient Position: Standing, Cuff Size: Normal)   Pulse 80   Temp 98.5 F (36.9 C)  (Oral)   Ht 5' 3 (1.6 m)   Wt 135 lb (61.2 kg)   SpO2 100%   BMI 23.91 kg/m  Wt Readings from Last 3 Encounters:  10/19/23 135 lb (61.2 kg)  04/24/23 136 lb (61.7 kg)  07/05/22 135 lb (61.2 kg)     Physical Exam  General appearance - alert, well appearing, and in no distress Mental status - normal mood, behavior, speech, dress, motor activity, and thought processes Eyes - pupils equal and reactive, extraocular eye movements intact Ears - bilateral TM's and external ear canals normal Nose - normal and patent, no erythema, discharge or polyps Mouth - mucous membranes moist, pharynx normal without lesions Neck - supple, no significant adenopathy Lymphatics - no palpable lymphadenopathy, no hepatosplenomegaly Chest - clear to auscultation, no wheezes, rales or rhonchi, symmetric air entry Heart - normal rate, regular rhythm, normal S1, S2, no murmurs, rubs, clicks or gallops Abdomen - soft, nontender, nondistended, no masses or organomegaly Musculoskeletal - no joint tenderness, deformity or swelling Extremities - peripheral pulses normal, no pedal edema, no clubbing or cyanosis Skin -she appears to have a tiny blackhead on the anterior neck left side.  It is not tender to touch.      Latest Ref Rng & Units 04/29/2022    7:33 PM 12/04/2020    3:28 PM 05/16/2019   11:51 AM  CMP  Glucose 70 - 99 mg/dL 77  896  73   BUN 6 - 20 mg/dL 10  16  12    Creatinine 0.44 - 1.00 mg/dL 9.26  9.15  9.25   Sodium 135 - 145 mmol/L 138  139  142   Potassium 3.5 - 5.1 mmol/L 3.7  4.2  4.6   Chloride 98 - 111 mmol/L 101  103  101   CO2 22 - 32 mmol/L 25  24  24    Calcium 8.9 - 10.3 mg/dL 89.9  89.9  9.5   Total Protein 6.0 - 8.5 g/dL  7.2    Total Bilirubin 0.0 - 1.2 mg/dL  0.2  Alkaline Phos 44 - 121 IU/L  63    AST 0 - 40 IU/L  18    ALT 0 - 32 IU/L  9     Lipid Panel  No results found for: CHOL, TRIG, HDL, CHOLHDL, VLDL, LDLCALC, LDLDIRECT  CBC    Component Value  Date/Time   WBC 6.5 04/29/2022 1933   RBC 4.19 04/29/2022 1933   HGB 12.0 04/29/2022 1933   HGB 12.1 12/04/2020 1528   HCT 38.9 04/29/2022 1933   HCT 36.1 12/04/2020 1528   PLT 225 04/29/2022 1933   PLT 223 12/04/2020 1528   MCV 92.8 04/29/2022 1933   MCV 85 12/04/2020 1528   MCV 87 03/28/2014 1816   MCH 28.6 04/29/2022 1933   MCHC 30.8 04/29/2022 1933   RDW 11.7 04/29/2022 1933   RDW 11.5 (L) 12/04/2020 1528   RDW 12.7 03/28/2014 1816   LYMPHSABS 3.9 04/19/2016 1950   MONOABS 0.6 04/19/2016 1950   EOSABS 0.2 04/19/2016 1950   BASOSABS 0.0 04/19/2016 1950    ASSESSMENT AND PLAN: 1. Annual physical exam (Primary) Advised to continue healthy eating habits and regular exercise. Advised to get dental screening and cleaning at least twice a year. Advised to get routine eye exam at least once every 2 years. Patient will check her immunization records when she returns home.  If she has not had the HPV vaccine, I told her to send a MyChart message and we can get her set up with our nurse to start the vaccine series. - Comprehensive metabolic panel with GFR - Lipid panel  2. Menstrual migraine without status migrainosus, not intractable It is not typical of a migraine and that it is short lasting.  However she reports good relief with Midol .  I told her that she can continue to use that as needed.  3. History of iron  deficiency anemia - CBC - Iron , TIBC and Ferritin Panel  4. Other fatigue Continue healthy eating habits and regular exercise.  We will check CBC and iron  levels today.  5. Black head Advised to observe for now.  If this increases in size or become bothersome she should let me know as we can refer to dermatology   Patient was given the opportunity to ask questions.  Patient verbalized understanding of the plan and was able to repeat key elements of the plan.   This documentation was completed using Paediatric nurse.  Any transcriptional errors  are unintentional.  Orders Placed This Encounter  Procedures   CBC   Comprehensive metabolic panel with GFR   Lipid panel   Iron , TIBC and Ferritin Panel     Requested Prescriptions    No prescriptions requested or ordered in this encounter    No follow-ups on file.  Barnie Louder, MD, FACP

## 2023-10-20 ENCOUNTER — Ambulatory Visit: Payer: Self-pay | Admitting: Internal Medicine

## 2023-10-20 LAB — IRON,TIBC AND FERRITIN PANEL
Ferritin: 18 ng/mL (ref 15–150)
Iron Saturation: 14 % — ABNORMAL LOW (ref 15–55)
Iron: 57 ug/dL (ref 27–159)
Total Iron Binding Capacity: 404 ug/dL (ref 250–450)
UIBC: 347 ug/dL (ref 131–425)

## 2023-10-20 LAB — COMPREHENSIVE METABOLIC PANEL WITH GFR
ALT: 8 IU/L (ref 0–32)
AST: 19 IU/L (ref 0–40)
Albumin: 4.8 g/dL (ref 3.9–4.9)
Alkaline Phosphatase: 53 IU/L (ref 44–121)
BUN/Creatinine Ratio: 18 (ref 9–23)
BUN: 13 mg/dL (ref 6–20)
Bilirubin Total: <0.2 mg/dL (ref 0.0–1.2)
CO2: 23 mmol/L (ref 20–29)
Calcium: 9.8 mg/dL (ref 8.7–10.2)
Chloride: 102 mmol/L (ref 96–106)
Creatinine, Ser: 0.72 mg/dL (ref 0.57–1.00)
Globulin, Total: 2.6 g/dL (ref 1.5–4.5)
Glucose: 78 mg/dL (ref 70–99)
Potassium: 4.5 mmol/L (ref 3.5–5.2)
Sodium: 141 mmol/L (ref 134–144)
Total Protein: 7.4 g/dL (ref 6.0–8.5)
eGFR: 114 mL/min/1.73 (ref >59)

## 2023-10-20 LAB — CBC
Hematocrit: 37.5 % (ref 34.0–46.6)
Hemoglobin: 11.8 g/dL (ref 11.1–15.9)
MCH: 28.6 pg (ref 26.6–33.0)
MCHC: 31.5 g/dL (ref 31.5–35.7)
MCV: 91 fL (ref 79–97)
Platelets: 221 x10E3/uL (ref 150–450)
RBC: 4.12 x10E6/uL (ref 3.77–5.28)
RDW: 11.2 % — ABNORMAL LOW (ref 11.7–15.4)
WBC: 6.6 x10E3/uL (ref 3.4–10.8)

## 2023-10-20 LAB — LIPID PANEL
Chol/HDL Ratio: 1.6 ratio (ref 0.0–4.4)
Cholesterol, Total: 131 mg/dL (ref 100–199)
HDL: 80 mg/dL (ref >39)
LDL Chol Calc (NIH): 42 mg/dL (ref 0–99)
Triglycerides: 28 mg/dL (ref 0–149)
VLDL Cholesterol Cal: 9 mg/dL (ref 5–40)

## 2024-10-21 ENCOUNTER — Ambulatory Visit: Payer: Self-pay | Admitting: Internal Medicine
